# Patient Record
Sex: Female | Born: 1980 | Race: Black or African American | Hispanic: No | Marital: Married | Smoking: Former smoker
Health system: Southern US, Community
[De-identification: ages and names within clinical notes are randomized; demographics above are authoritative.]

## PROBLEM LIST (undated history)

## (undated) DIAGNOSIS — N390 Urinary tract infection, site not specified: Secondary | ICD-10-CM

## (undated) DIAGNOSIS — N186 End stage renal disease: Secondary | ICD-10-CM

## (undated) DIAGNOSIS — N2581 Secondary hyperparathyroidism of renal origin: Secondary | ICD-10-CM

## (undated) DIAGNOSIS — K219 Gastro-esophageal reflux disease without esophagitis: Secondary | ICD-10-CM

## (undated) DIAGNOSIS — D649 Anemia, unspecified: Secondary | ICD-10-CM

## (undated) DIAGNOSIS — N760 Acute vaginitis: Secondary | ICD-10-CM

## (undated) DIAGNOSIS — R630 Anorexia: Secondary | ICD-10-CM

## (undated) DIAGNOSIS — K658 Other peritonitis: Secondary | ICD-10-CM

## (undated) DIAGNOSIS — Z923 Personal history of irradiation: Secondary | ICD-10-CM

## (undated) DIAGNOSIS — B9689 Other specified bacterial agents as the cause of diseases classified elsewhere: Secondary | ICD-10-CM

## (undated) DIAGNOSIS — Z992 Dependence on renal dialysis: Secondary | ICD-10-CM

## (undated) DIAGNOSIS — I10 Essential (primary) hypertension: Secondary | ICD-10-CM

## (undated) DIAGNOSIS — R350 Frequency of micturition: Secondary | ICD-10-CM

## (undated) DIAGNOSIS — R319 Hematuria, unspecified: Secondary | ICD-10-CM

## (undated) DIAGNOSIS — N898 Other specified noninflammatory disorders of vagina: Principal | ICD-10-CM

## (undated) HISTORY — DX: Frequency of micturition: R35.0

## (undated) HISTORY — DX: Urinary tract infection, site not specified: N39.0

## (undated) HISTORY — DX: Hematuria, unspecified: R31.9

## (undated) HISTORY — DX: Other specified bacterial agents as the cause of diseases classified elsewhere: B96.89

## (undated) HISTORY — DX: Essential (primary) hypertension: I10

## (undated) HISTORY — DX: Anorexia: R63.0

## (undated) HISTORY — DX: Acute vaginitis: N76.0

## (undated) HISTORY — DX: Other specified noninflammatory disorders of vagina: N89.8

## (undated) HISTORY — PX: UMBILICAL HERNIA REPAIR: SHX196

## (undated) HISTORY — PX: WISDOM TOOTH EXTRACTION: SHX21

---

## 2002-02-06 ENCOUNTER — Emergency Department (HOSPITAL_COMMUNITY): Admission: EM | Admit: 2002-02-06 | Discharge: 2002-02-06 | Payer: Self-pay | Admitting: Emergency Medicine

## 2002-10-22 ENCOUNTER — Emergency Department (HOSPITAL_COMMUNITY): Admission: EM | Admit: 2002-10-22 | Discharge: 2002-10-22 | Payer: Self-pay | Admitting: Internal Medicine

## 2004-02-05 ENCOUNTER — Other Ambulatory Visit: Admission: RE | Admit: 2004-02-05 | Discharge: 2004-02-05 | Payer: Self-pay

## 2004-03-21 ENCOUNTER — Ambulatory Visit (HOSPITAL_COMMUNITY): Admission: RE | Admit: 2004-03-21 | Discharge: 2004-03-21 | Payer: Self-pay | Admitting: Nephrology

## 2006-10-05 DIAGNOSIS — N186 End stage renal disease: Secondary | ICD-10-CM

## 2006-10-05 DIAGNOSIS — Z992 Dependence on renal dialysis: Secondary | ICD-10-CM

## 2006-10-05 HISTORY — PX: OTHER SURGICAL HISTORY: SHX169

## 2006-10-05 HISTORY — DX: Dependence on renal dialysis: N18.6

## 2006-10-05 HISTORY — DX: End stage renal disease: Z99.2

## 2007-05-23 ENCOUNTER — Emergency Department (HOSPITAL_COMMUNITY): Admission: EM | Admit: 2007-05-23 | Discharge: 2007-05-23 | Payer: Self-pay | Admitting: Emergency Medicine

## 2008-03-21 ENCOUNTER — Ambulatory Visit (HOSPITAL_COMMUNITY): Admission: RE | Admit: 2008-03-21 | Discharge: 2008-03-21 | Payer: Self-pay | Admitting: Nephrology

## 2008-04-04 ENCOUNTER — Ambulatory Visit (HOSPITAL_COMMUNITY): Payer: Self-pay | Admitting: Internal Medicine

## 2008-04-04 ENCOUNTER — Encounter (HOSPITAL_COMMUNITY): Admission: RE | Admit: 2008-04-04 | Discharge: 2008-05-04 | Payer: Self-pay | Admitting: Oncology

## 2008-04-17 ENCOUNTER — Ambulatory Visit (HOSPITAL_COMMUNITY): Admission: RE | Admit: 2008-04-17 | Discharge: 2008-04-17 | Payer: Self-pay | Admitting: Nephrology

## 2008-05-16 ENCOUNTER — Encounter (HOSPITAL_COMMUNITY): Admission: RE | Admit: 2008-05-16 | Discharge: 2008-06-15 | Payer: Self-pay | Admitting: Internal Medicine

## 2008-05-18 ENCOUNTER — Ambulatory Visit (HOSPITAL_COMMUNITY): Admission: RE | Admit: 2008-05-18 | Discharge: 2008-05-18 | Payer: Self-pay | Admitting: Nephrology

## 2008-05-22 ENCOUNTER — Ambulatory Visit: Payer: Self-pay | Admitting: Vascular Surgery

## 2008-05-30 ENCOUNTER — Ambulatory Visit (HOSPITAL_COMMUNITY): Payer: Self-pay | Admitting: Internal Medicine

## 2008-06-01 ENCOUNTER — Ambulatory Visit (HOSPITAL_COMMUNITY): Payer: Self-pay | Admitting: Nephrology

## 2008-06-08 ENCOUNTER — Ambulatory Visit: Payer: Self-pay | Admitting: Vascular Surgery

## 2008-06-08 ENCOUNTER — Ambulatory Visit (HOSPITAL_COMMUNITY): Admission: RE | Admit: 2008-06-08 | Discharge: 2008-06-08 | Payer: Self-pay | Admitting: Vascular Surgery

## 2008-06-15 ENCOUNTER — Ambulatory Visit (HOSPITAL_COMMUNITY): Admission: RE | Admit: 2008-06-15 | Discharge: 2008-06-15 | Payer: Self-pay | Admitting: Nephrology

## 2008-07-04 ENCOUNTER — Encounter (HOSPITAL_COMMUNITY): Admission: RE | Admit: 2008-07-04 | Discharge: 2008-08-03 | Payer: Self-pay | Admitting: Nephrology

## 2008-07-23 ENCOUNTER — Encounter (HOSPITAL_COMMUNITY): Admission: RE | Admit: 2008-07-23 | Discharge: 2008-08-22 | Payer: Self-pay | Admitting: Oncology

## 2008-08-02 ENCOUNTER — Ambulatory Visit: Payer: Self-pay | Admitting: *Deleted

## 2008-08-02 ENCOUNTER — Ambulatory Visit (HOSPITAL_COMMUNITY): Admission: RE | Admit: 2008-08-02 | Discharge: 2008-08-02 | Payer: Self-pay | Admitting: Nephrology

## 2008-08-08 ENCOUNTER — Ambulatory Visit (HOSPITAL_COMMUNITY): Payer: Self-pay | Admitting: Internal Medicine

## 2008-08-10 ENCOUNTER — Ambulatory Visit: Payer: Self-pay | Admitting: Vascular Surgery

## 2008-08-10 ENCOUNTER — Ambulatory Visit (HOSPITAL_COMMUNITY): Admission: RE | Admit: 2008-08-10 | Discharge: 2008-08-10 | Payer: Self-pay | Admitting: Vascular Surgery

## 2008-08-28 ENCOUNTER — Ambulatory Visit: Payer: Self-pay | Admitting: Vascular Surgery

## 2008-09-13 ENCOUNTER — Encounter (HOSPITAL_COMMUNITY): Admission: RE | Admit: 2008-09-13 | Discharge: 2008-10-03 | Payer: Self-pay | Admitting: Internal Medicine

## 2008-10-09 ENCOUNTER — Ambulatory Visit: Payer: Self-pay | Admitting: Vascular Surgery

## 2008-10-19 ENCOUNTER — Ambulatory Visit (HOSPITAL_COMMUNITY): Admission: RE | Admit: 2008-10-19 | Discharge: 2008-10-19 | Payer: Self-pay | Admitting: Surgery

## 2008-10-19 ENCOUNTER — Ambulatory Visit: Payer: Self-pay | Admitting: Surgery

## 2008-11-07 ENCOUNTER — Ambulatory Visit (HOSPITAL_COMMUNITY): Payer: Self-pay | Admitting: Internal Medicine

## 2008-11-07 ENCOUNTER — Encounter (HOSPITAL_COMMUNITY): Admission: RE | Admit: 2008-11-07 | Discharge: 2008-12-07 | Payer: Self-pay | Admitting: Internal Medicine

## 2008-11-12 ENCOUNTER — Ambulatory Visit: Payer: Self-pay | Admitting: Surgery

## 2008-11-28 ENCOUNTER — Ambulatory Visit (HOSPITAL_COMMUNITY): Admission: RE | Admit: 2008-11-28 | Discharge: 2008-11-28 | Payer: Self-pay | Admitting: Surgery

## 2008-11-28 ENCOUNTER — Ambulatory Visit: Payer: Self-pay | Admitting: Surgery

## 2008-12-13 ENCOUNTER — Ambulatory Visit (HOSPITAL_COMMUNITY): Admission: RE | Admit: 2008-12-13 | Discharge: 2008-12-13 | Payer: Self-pay | Admitting: Critical Care Medicine

## 2008-12-25 ENCOUNTER — Ambulatory Visit (HOSPITAL_COMMUNITY): Payer: Self-pay | Admitting: Internal Medicine

## 2008-12-25 ENCOUNTER — Encounter (HOSPITAL_COMMUNITY): Admission: RE | Admit: 2008-12-25 | Discharge: 2009-01-24 | Payer: Self-pay | Admitting: Internal Medicine

## 2008-12-31 ENCOUNTER — Ambulatory Visit: Payer: Self-pay | Admitting: Surgery

## 2009-01-02 ENCOUNTER — Ambulatory Visit: Payer: Self-pay | Admitting: Oncology

## 2009-01-31 ENCOUNTER — Encounter (HOSPITAL_COMMUNITY): Admission: RE | Admit: 2009-01-31 | Discharge: 2009-03-02 | Payer: Self-pay | Admitting: Nephrology

## 2009-01-31 ENCOUNTER — Ambulatory Visit (HOSPITAL_COMMUNITY): Payer: Self-pay | Admitting: Internal Medicine

## 2009-02-01 ENCOUNTER — Ambulatory Visit (HOSPITAL_COMMUNITY): Admission: RE | Admit: 2009-02-01 | Discharge: 2009-02-01 | Payer: Self-pay | Admitting: Nephrology

## 2009-02-01 LAB — CBC WITH DIFFERENTIAL/PLATELET
BASO%: 0.5 % (ref 0.0–2.0)
LYMPH%: 38.5 % (ref 14.0–49.7)
MCHC: 32.8 g/dL (ref 31.5–36.0)
MONO#: 0.3 10*3/uL (ref 0.1–0.9)
RBC: 4.22 10*6/uL (ref 3.70–5.45)
RDW: 16.9 % — ABNORMAL HIGH (ref 11.2–14.5)
WBC: 4.5 10*3/uL (ref 3.9–10.3)
lymph#: 1.7 10*3/uL (ref 0.9–3.3)

## 2009-02-01 LAB — COMPREHENSIVE METABOLIC PANEL
ALT: 11 U/L (ref 0–35)
AST: 15 U/L (ref 0–37)
Calcium: 8.3 mg/dL — ABNORMAL LOW (ref 8.4–10.5)
Chloride: 108 mEq/L (ref 96–112)
Creatinine, Ser: 7.27 mg/dL — ABNORMAL HIGH (ref 0.40–1.20)
Potassium: 3.6 mEq/L (ref 3.5–5.3)

## 2009-02-01 LAB — CHCC SMEAR

## 2009-02-06 LAB — HYPERCOAGULABLE PANEL, COMPREHENSIVE RET.
Anticardiolipin IgA: 8 [APL'U] (ref <13)
Anticardiolipin IgM: <7 [MPL'U] (ref <10)
Beta-2 Glyco I IgG: 5 U/mL (ref <20)
Protein C Activity: 116 % (ref 75–133)
Protein C, Total: 67 % — ABNORMAL LOW (ref 70–140)

## 2009-02-06 LAB — FACTOR 8 ASSAY: Coagulation Factor VIII: 87 % (ref 73–140)

## 2009-02-14 ENCOUNTER — Encounter (HOSPITAL_COMMUNITY): Admission: RE | Admit: 2009-02-14 | Discharge: 2009-03-16 | Payer: Self-pay | Admitting: Internal Medicine

## 2009-02-20 ENCOUNTER — Ambulatory Visit: Payer: Self-pay | Admitting: Oncology

## 2009-03-11 ENCOUNTER — Ambulatory Visit: Payer: Self-pay | Admitting: Surgery

## 2009-03-14 ENCOUNTER — Ambulatory Visit (HOSPITAL_COMMUNITY): Admission: RE | Admit: 2009-03-14 | Discharge: 2009-03-14 | Payer: Self-pay | Admitting: Nephrology

## 2009-04-02 ENCOUNTER — Ambulatory Visit (HOSPITAL_COMMUNITY): Payer: Self-pay | Admitting: Internal Medicine

## 2009-04-02 ENCOUNTER — Encounter (HOSPITAL_COMMUNITY): Admission: RE | Admit: 2009-04-02 | Discharge: 2009-05-02 | Payer: Self-pay | Admitting: Internal Medicine

## 2009-05-07 ENCOUNTER — Encounter (HOSPITAL_COMMUNITY): Admission: RE | Admit: 2009-05-07 | Discharge: 2009-06-06 | Payer: Self-pay | Admitting: Internal Medicine

## 2009-05-23 ENCOUNTER — Ambulatory Visit (HOSPITAL_COMMUNITY): Payer: Self-pay | Admitting: Internal Medicine

## 2009-05-28 ENCOUNTER — Ambulatory Visit: Payer: Self-pay | Admitting: Oncology

## 2009-06-12 ENCOUNTER — Ambulatory Visit (HOSPITAL_COMMUNITY): Admission: RE | Admit: 2009-06-12 | Discharge: 2009-06-12 | Payer: Self-pay | Admitting: General Surgery

## 2009-07-16 ENCOUNTER — Emergency Department (HOSPITAL_COMMUNITY): Admission: EM | Admit: 2009-07-16 | Discharge: 2009-07-16 | Payer: Self-pay | Admitting: Emergency Medicine

## 2009-08-08 ENCOUNTER — Ambulatory Visit (HOSPITAL_COMMUNITY): Admission: RE | Admit: 2009-08-08 | Discharge: 2009-08-08 | Payer: Self-pay | Admitting: Internal Medicine

## 2009-08-10 ENCOUNTER — Ambulatory Visit (HOSPITAL_COMMUNITY): Admission: RE | Admit: 2009-08-10 | Discharge: 2009-08-10 | Payer: Self-pay | Admitting: Internal Medicine

## 2009-09-04 ENCOUNTER — Inpatient Hospital Stay (HOSPITAL_COMMUNITY): Admission: EM | Admit: 2009-09-04 | Discharge: 2009-09-08 | Payer: Self-pay | Admitting: Emergency Medicine

## 2009-09-04 ENCOUNTER — Ambulatory Visit: Payer: Self-pay | Admitting: Cardiology

## 2009-09-04 DIAGNOSIS — K219 Gastro-esophageal reflux disease without esophagitis: Secondary | ICD-10-CM

## 2009-09-04 HISTORY — DX: Gastro-esophageal reflux disease without esophagitis: K21.9

## 2009-09-06 ENCOUNTER — Encounter (INDEPENDENT_AMBULATORY_CARE_PROVIDER_SITE_OTHER): Payer: Self-pay | Admitting: Nephrology

## 2010-04-20 IMAGING — CR DG FEMUR 2V*L*
4 series · 4 of 4 positions shown · non-contrast
Comparison: None.

CLINICAL DATA: Left thigh pain.  Question bone versus muscle
abnormality?

LEFT FEMUR - 2 VIEW

[view not recorded (1 of 4)]
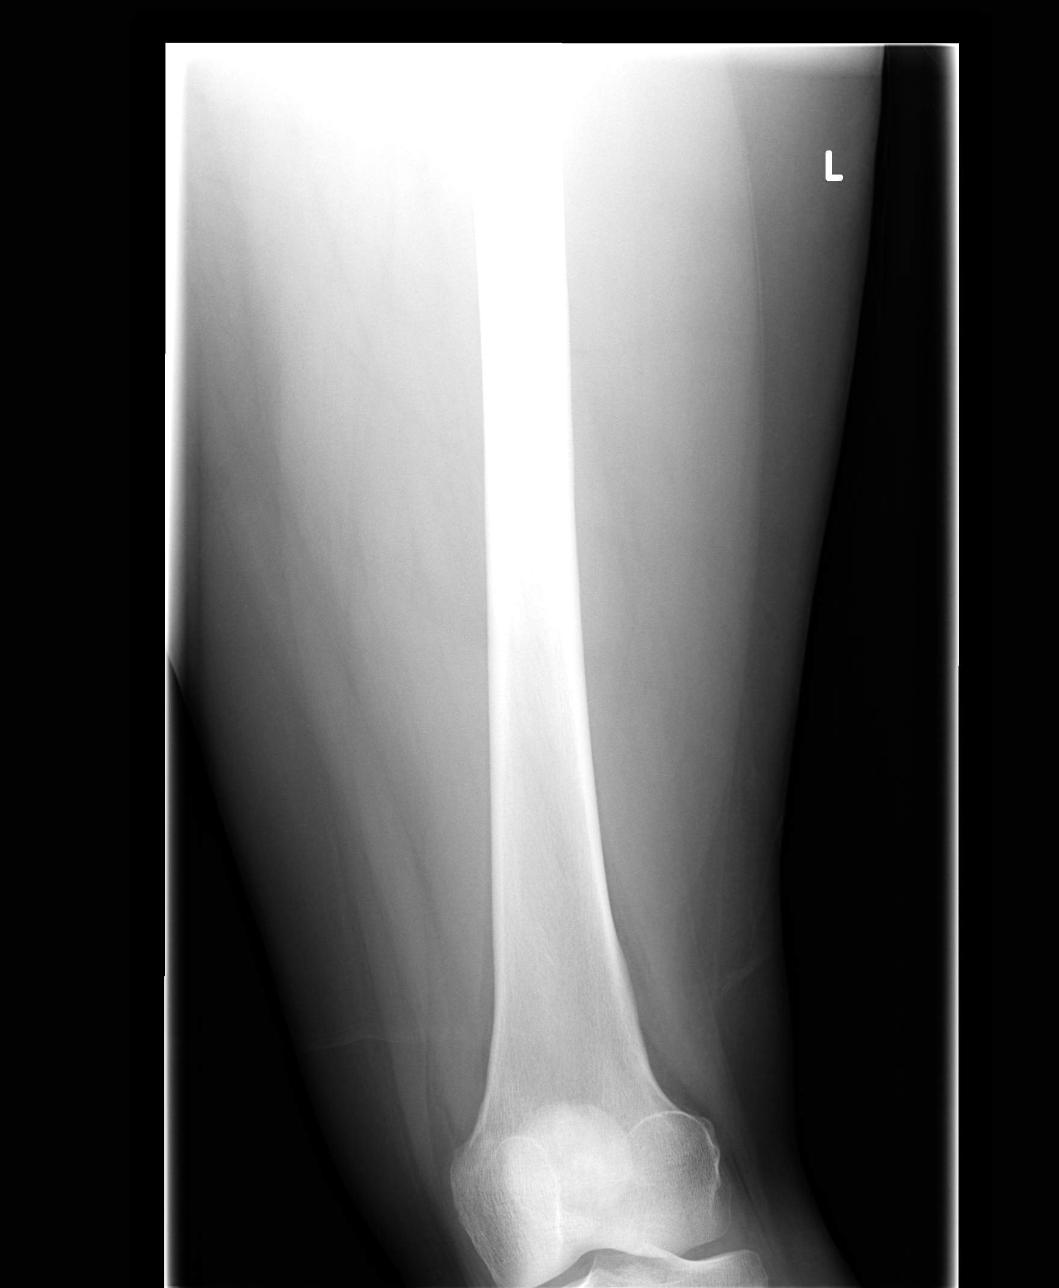

[view not recorded (2 of 4)]
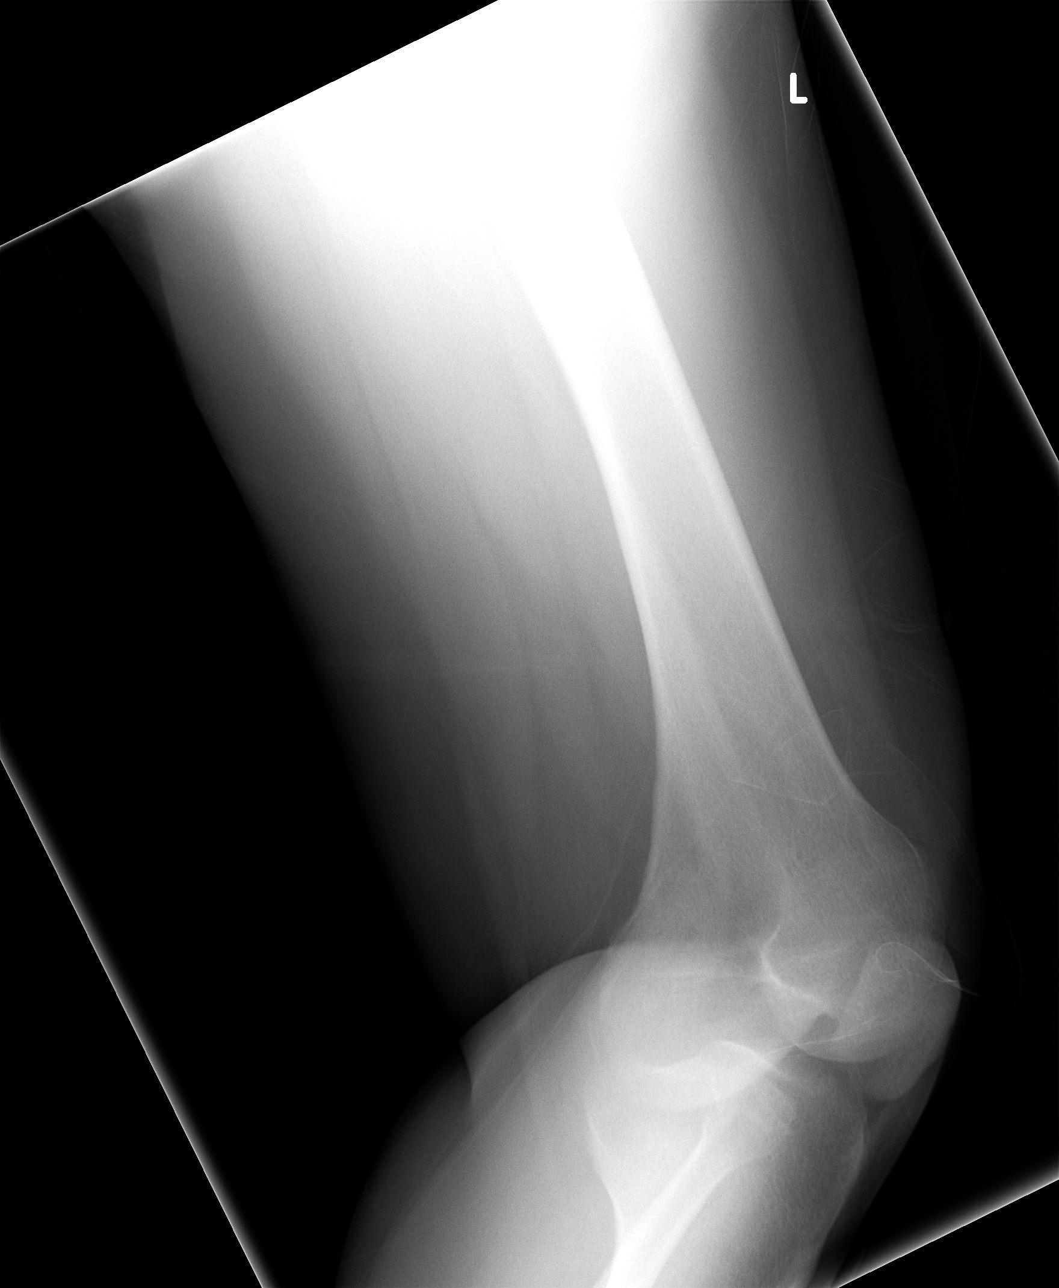

[view not recorded (3 of 4)]
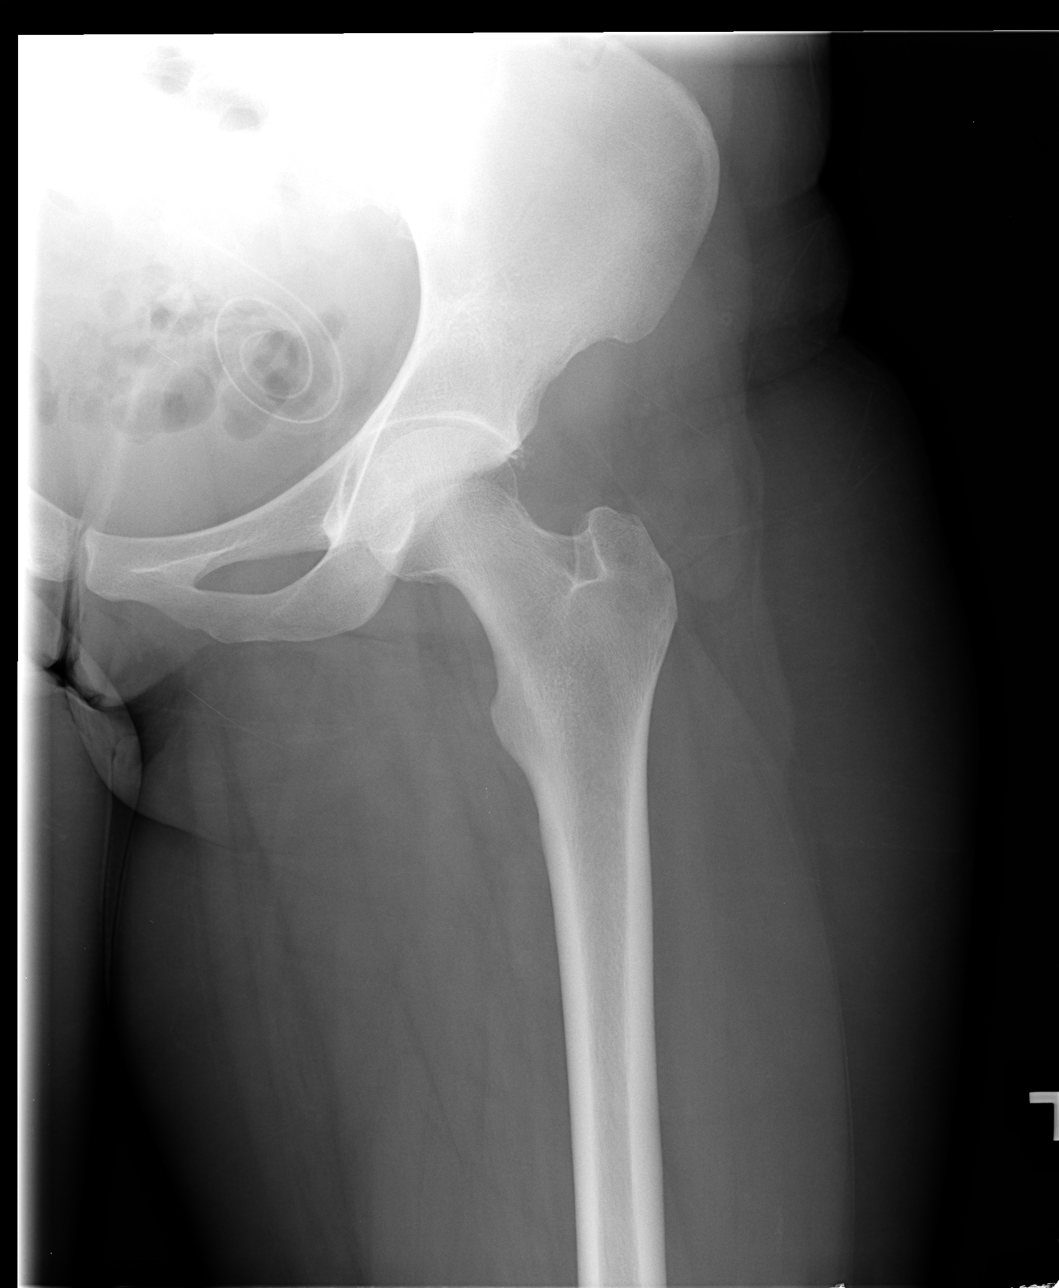

[view not recorded (4 of 4)]
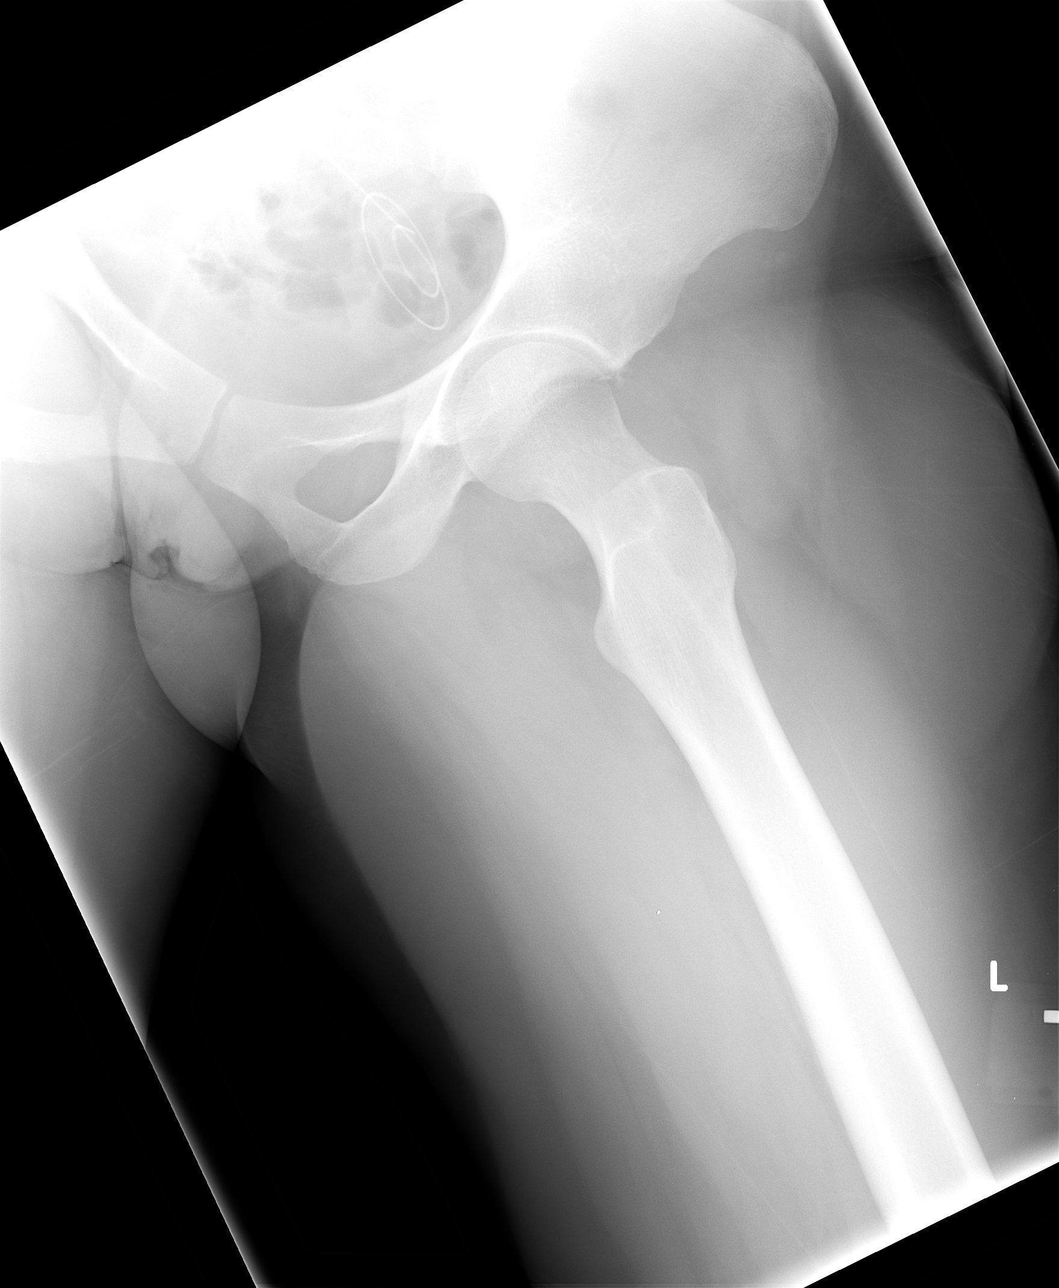

[4 of 4 positions shown; findings below may reference images not displayed]

FINDINGS: No bony or soft tissue abnormality.  MR imaging may prove
helpful further delineation if symptoms persist.
IMPRESSION: No bony or soft tissue abnormality.  MR imaging may prove helpful
further delineation if symptoms persist.

## 2010-04-20 IMAGING — CR DG HIP (WITH OR WITHOUT PELVIS) 2-3V*L*
3 series · 3 of 3 positions shown · non-contrast
Comparison: None.

CLINICAL DATA: Left thigh pain for past week.  Peritoneal dialysis.
No history of trauma provided. Question bone abnormality versus
myositis or muscle tear.

LEFT HIP - COMPLETE 2+ VIEW

[view not recorded (1 of 3)]
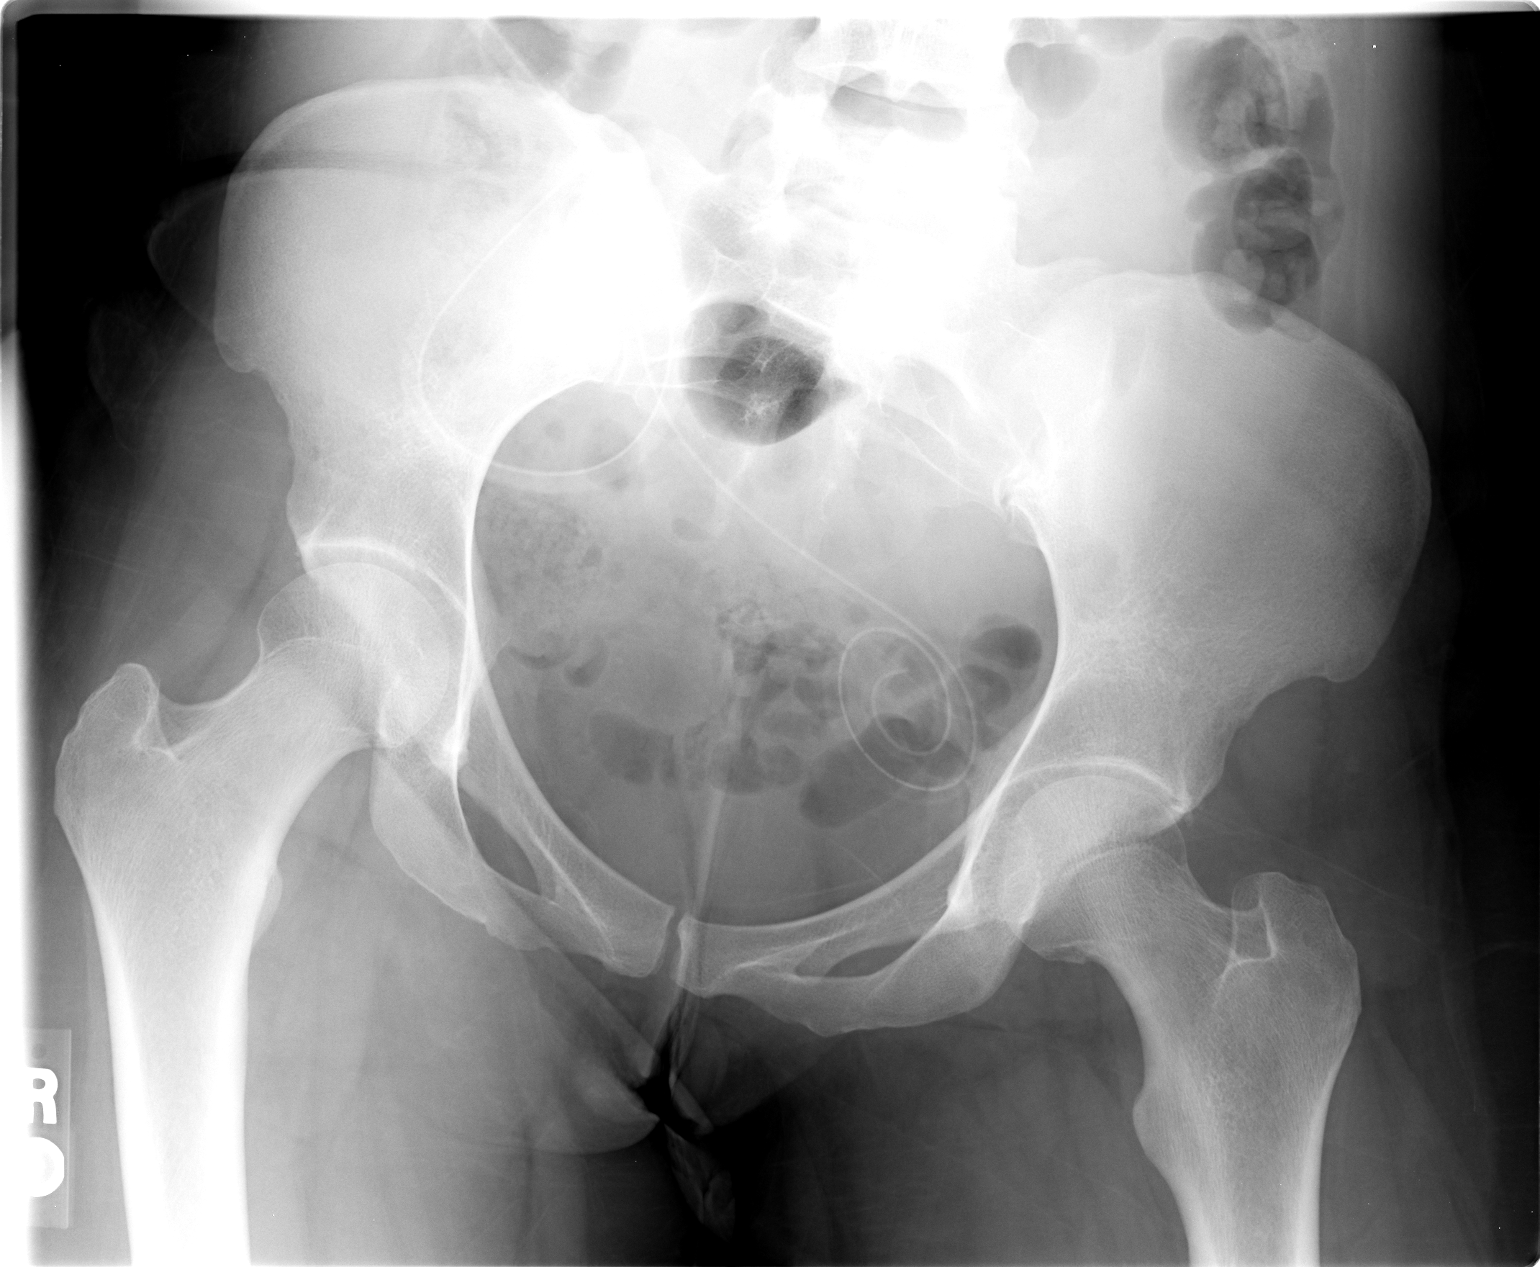

[view not recorded (2 of 3)]
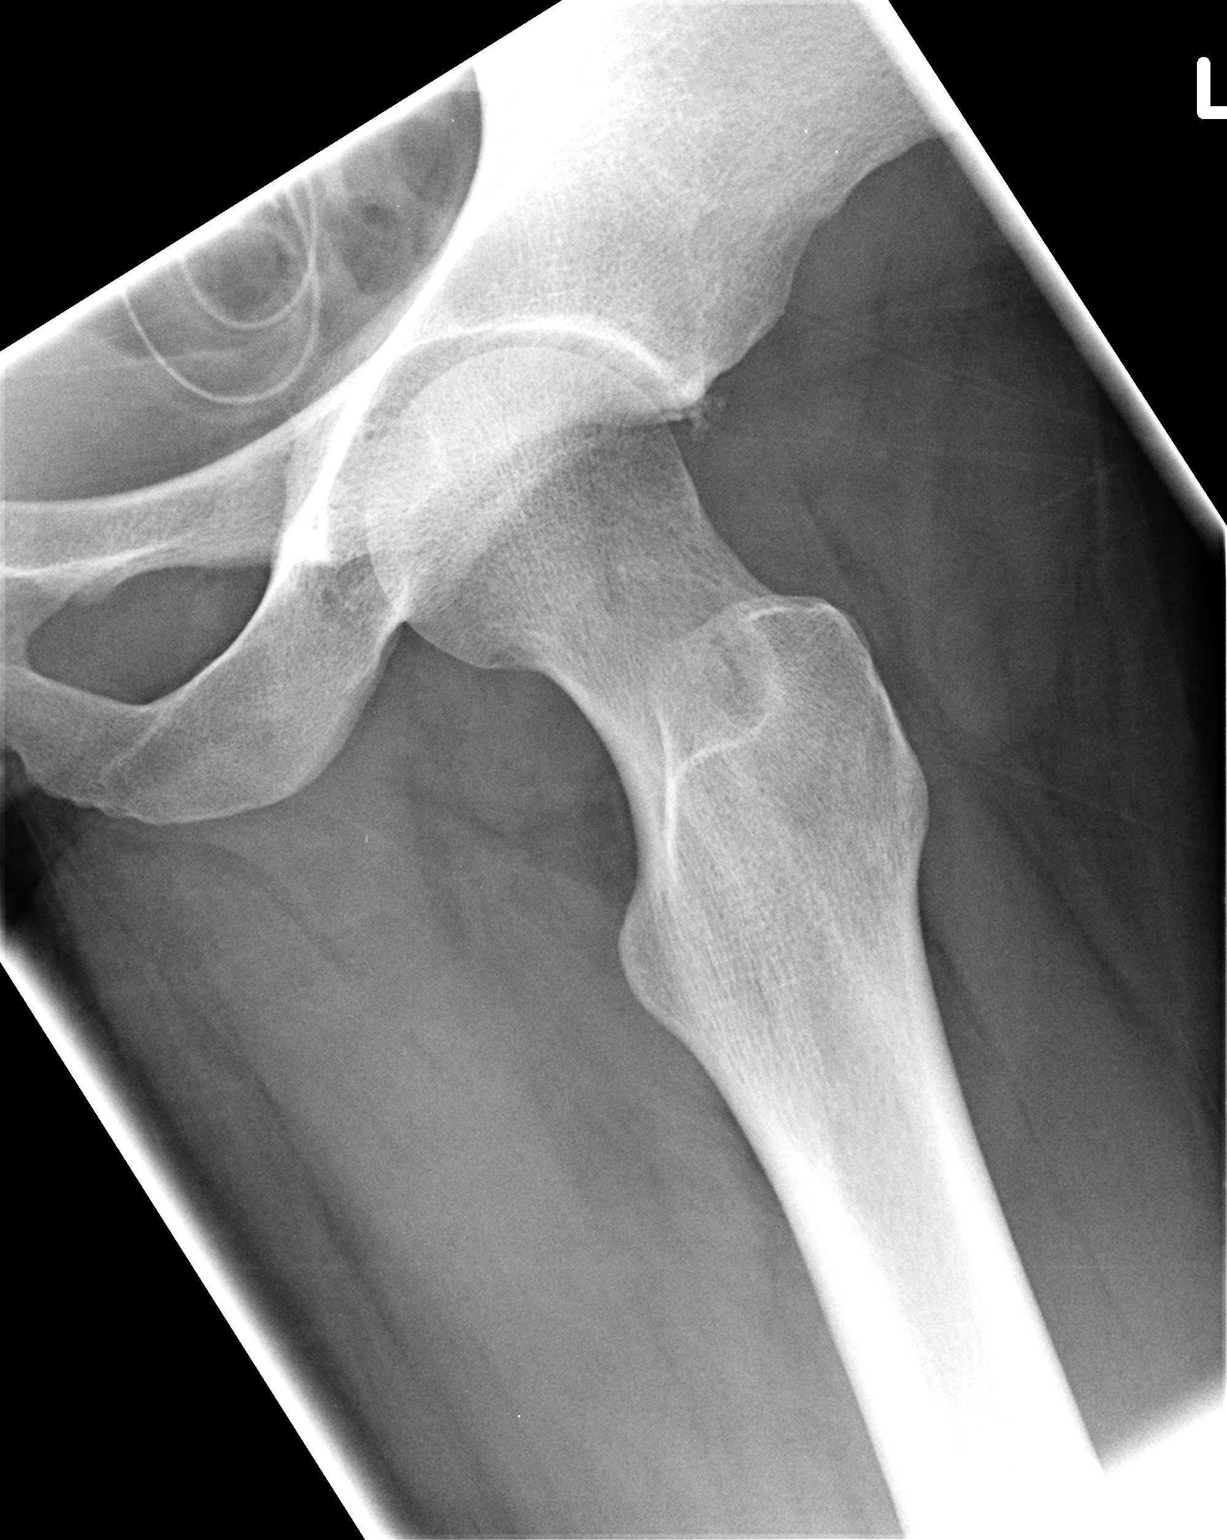

[view not recorded (3 of 3)]
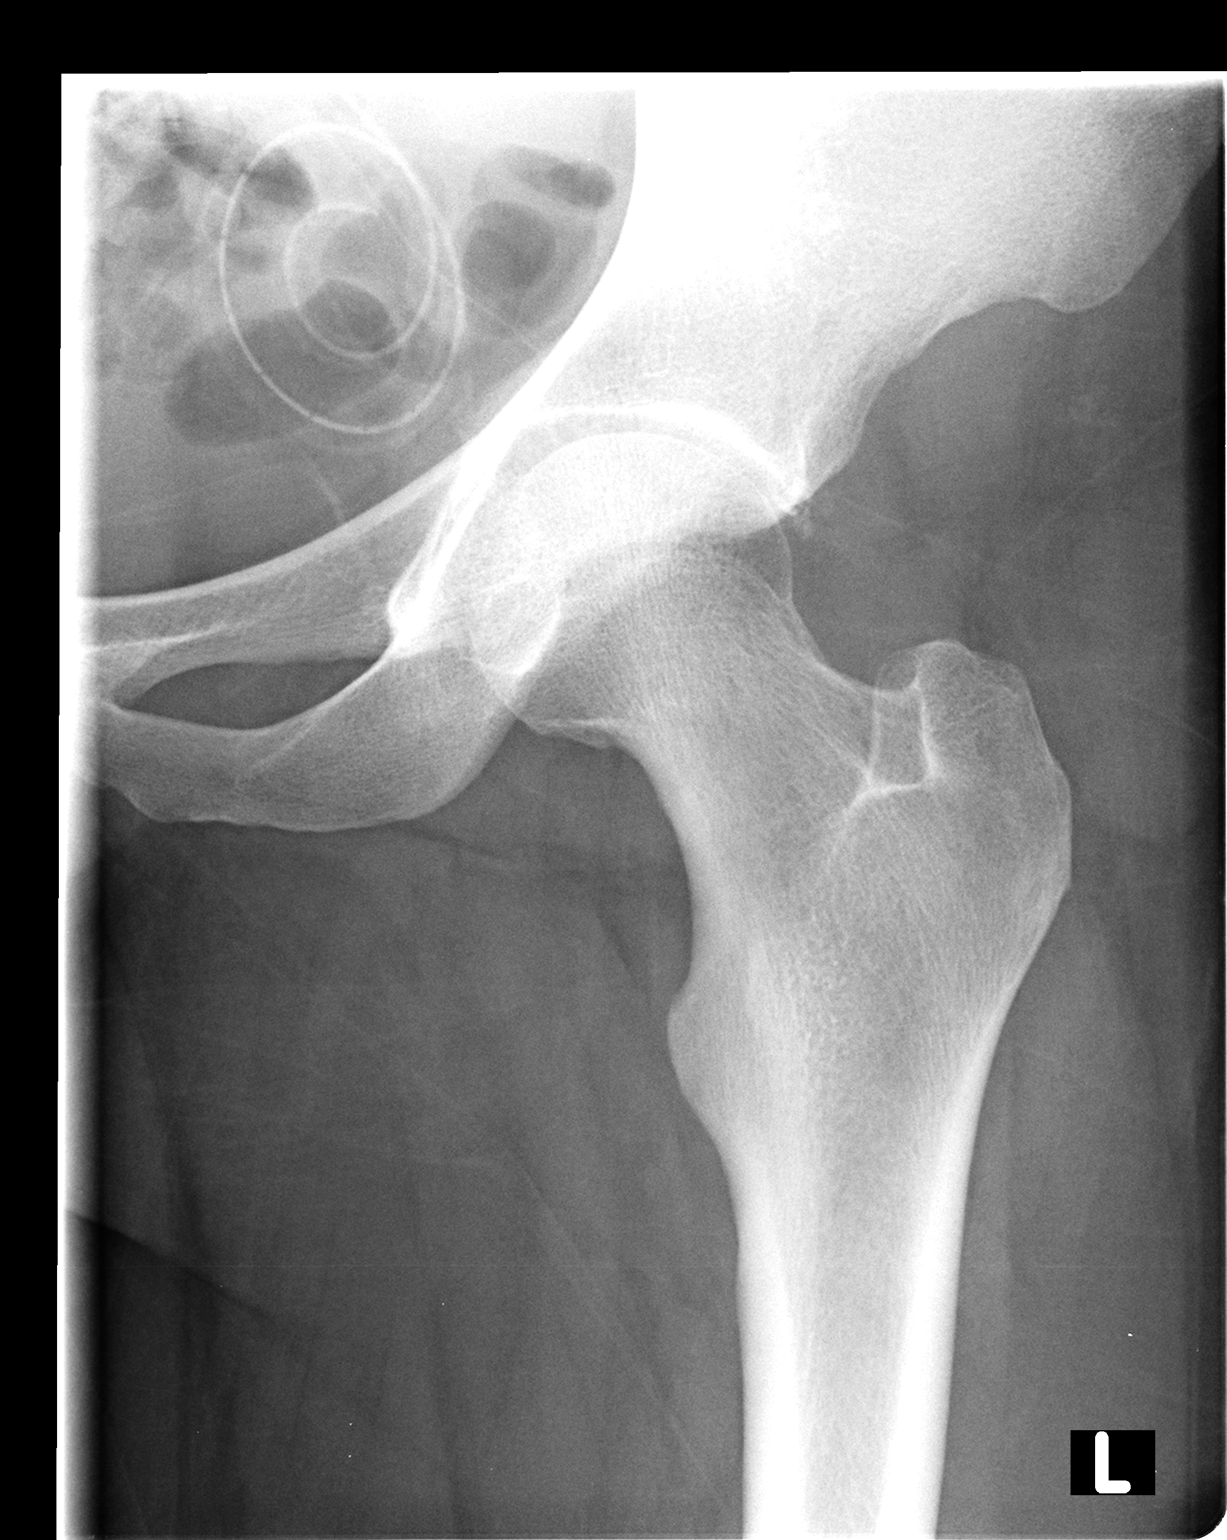

[3 of 3 positions shown; findings below may reference images not displayed]

FINDINGS: No  fracture.  No bony destructive lesion.  No plain film
evidence of avascular necrosis.  Peritoneal dialysis catheter is in
place.  If the patient has persistent discomfort, MR imaging may
prove more sensitive for detection of bony/soft tissue abnormality.
IMPRESSION: No plain film evidence of bony abnormality.  Please see above.

## 2011-01-06 LAB — COMPREHENSIVE METABOLIC PANEL
ALT: 15 U/L (ref 0–35)
ALT: 20 U/L (ref 0–35)
AST: 12 U/L (ref 0–37)
AST: 25 U/L (ref 0–37)
Albumin: 3.7 g/dL (ref 3.5–5.2)
Albumin: 4.4 g/dL (ref 3.5–5.2)
Alkaline Phosphatase: 54 U/L (ref 39–117)
BUN: 30 mg/dL — ABNORMAL HIGH (ref 6–23)
BUN: 36 mg/dL — ABNORMAL HIGH (ref 6–23)
CO2: 25 mEq/L (ref 19–32)
Calcium: 9.4 mg/dL (ref 8.4–10.5)
Chloride: 107 mEq/L (ref 96–112)
Chloride: 94 mEq/L — ABNORMAL LOW (ref 96–112)
Creatinine, Ser: 18.75 mg/dL — ABNORMAL HIGH (ref 0.4–1.2)
Creatinine, Ser: 20.78 mg/dL — ABNORMAL HIGH (ref 0.4–1.2)
Creatinine, Ser: 22.3 mg/dL — ABNORMAL HIGH (ref 0.4–1.2)
GFR calc Af Amer: 3 mL/min — ABNORMAL LOW (ref >60)
GFR calc non Af Amer: 2 mL/min — ABNORMAL LOW (ref >60)
GFR calc non Af Amer: 2 mL/min — ABNORMAL LOW (ref >60)
Glucose, Bld: 99 mg/dL (ref 70–99)
Potassium: 2.8 mEq/L — ABNORMAL LOW (ref 3.5–5.1)
Potassium: 3.6 mEq/L (ref 3.5–5.1)
Sodium: 139 mEq/L (ref 135–145)
Sodium: 141 mEq/L (ref 135–145)
Total Bilirubin: 0.9 mg/dL (ref 0.3–1.2)
Total Protein: 5.3 g/dL — ABNORMAL LOW (ref 6.0–8.3)
Total Protein: 7.1 g/dL (ref 6.0–8.3)
Total Protein: 8.6 g/dL — ABNORMAL HIGH (ref 6.0–8.3)

## 2011-01-06 LAB — RENAL FUNCTION PANEL
BUN: 26 mg/dL — ABNORMAL HIGH (ref 6–23)
CO2: 23 mEq/L (ref 19–32)
CO2: 25 mEq/L (ref 19–32)
Calcium: 7.9 mg/dL — ABNORMAL LOW (ref 8.4–10.5)
Calcium: 8.1 mg/dL — ABNORMAL LOW (ref 8.4–10.5)
Chloride: 108 mEq/L (ref 96–112)
Creatinine, Ser: 15.3 mg/dL — ABNORMAL HIGH (ref 0.4–1.2)
GFR calc Af Amer: 3 mL/min — ABNORMAL LOW (ref >60)
GFR calc non Af Amer: 3 mL/min — ABNORMAL LOW (ref >60)
Glucose, Bld: 100 mg/dL — ABNORMAL HIGH (ref 70–99)
Glucose, Bld: 127 mg/dL — ABNORMAL HIGH (ref 70–99)
Phosphorus: 2.9 mg/dL (ref 2.3–4.6)
Sodium: 140 mEq/L (ref 135–145)

## 2011-01-06 LAB — DIFFERENTIAL
Basophils Absolute: 0 10*3/uL (ref 0.0–0.1)
Basophils Relative: 0 % (ref 0–1)
Basophils Relative: 1 % (ref 0–1)
Eosinophils Absolute: 0.2 10*3/uL (ref 0.0–0.7)
Eosinophils Relative: 5 % (ref 0–5)
Lymphocytes Relative: 25 % (ref 12–46)
Lymphs Abs: 2.1 10*3/uL (ref 0.7–4.0)
Monocytes Absolute: 0.3 10*3/uL (ref 0.1–1.0)
Monocytes Absolute: 0.4 10*3/uL (ref 0.1–1.0)
Monocytes Relative: 6 % (ref 3–12)
Neutro Abs: 4.9 10*3/uL (ref 1.7–7.7)
Neutrophils Relative %: 50 % (ref 43–77)

## 2011-01-06 LAB — BODY FLUID CULTURE

## 2011-01-06 LAB — CBC
HCT: 33.5 % — ABNORMAL LOW (ref 36.0–46.0)
HCT: 34.6 % — ABNORMAL LOW (ref 36.0–46.0)
Hemoglobin: 10.9 g/dL — ABNORMAL LOW (ref 12.0–15.0)
Hemoglobin: 11.5 g/dL — ABNORMAL LOW (ref 12.0–15.0)
MCHC: 32.5 g/dL (ref 30.0–36.0)
MCHC: 33 g/dL (ref 30.0–36.0)
MCHC: 33.2 g/dL (ref 30.0–36.0)
MCV: 86.1 fL (ref 78.0–100.0)
MCV: 86.4 fL (ref 78.0–100.0)
MCV: 86.5 fL (ref 78.0–100.0)
MCV: 86.9 fL (ref 78.0–100.0)
MCV: 86.9 fL (ref 78.0–100.0)
Platelets: 123 10*3/uL — ABNORMAL LOW (ref 150–400)
Platelets: 165 10*3/uL (ref 150–400)
RBC: 3.98 MIL/uL (ref 3.87–5.11)
RBC: 5.95 MIL/uL — ABNORMAL HIGH (ref 3.87–5.11)
RDW: 15.2 % (ref 11.5–15.5)
RDW: 15.3 % (ref 11.5–15.5)
RDW: 15.4 % (ref 11.5–15.5)
WBC: 4.6 10*3/uL (ref 4.0–10.5)
WBC: 5.3 10*3/uL (ref 4.0–10.5)
WBC: 7.2 10*3/uL (ref 4.0–10.5)
WBC: 7.8 10*3/uL (ref 4.0–10.5)

## 2011-01-06 LAB — BODY FLUID CELL COUNT WITH DIFFERENTIAL: Total Nucleated Cell Count, Fluid: 1 cu mm (ref 0–1000)

## 2011-01-06 LAB — BASIC METABOLIC PANEL
BUN: 36 mg/dL — ABNORMAL HIGH (ref 6–23)
Chloride: 103 mEq/L (ref 96–112)
Creatinine, Ser: 19.81 mg/dL — ABNORMAL HIGH (ref 0.4–1.2)
GFR calc Af Amer: 3 mL/min — ABNORMAL LOW (ref >60)
Glucose, Bld: 129 mg/dL — ABNORMAL HIGH (ref 70–99)

## 2011-01-06 LAB — AMYLASE: Amylase: 206 U/L — ABNORMAL HIGH (ref 0–105)

## 2011-01-06 LAB — GRAM STAIN

## 2011-01-06 LAB — CORTISOL: Cortisol, Plasma: 13 ug/dL

## 2011-01-08 LAB — COMPREHENSIVE METABOLIC PANEL
ALT: 13 U/L (ref 0–35)
AST: 15 U/L (ref 0–37)
Albumin: 5.2 g/dL (ref 3.5–5.2)
Alkaline Phosphatase: 65 U/L (ref 39–117)
CO2: 23 mEq/L (ref 19–32)
Chloride: 91 mEq/L — ABNORMAL LOW (ref 96–112)
Creatinine, Ser: 19.06 mg/dL — ABNORMAL HIGH (ref 0.4–1.2)
GFR calc Af Amer: 3 mL/min — ABNORMAL LOW (ref >60)
Potassium: 3.4 mEq/L — ABNORMAL LOW (ref 3.5–5.1)
Sodium: 137 mEq/L (ref 135–145)
Total Bilirubin: 0.9 mg/dL (ref 0.3–1.2)

## 2011-01-08 LAB — URINE MICROSCOPIC-ADD ON

## 2011-01-08 LAB — URINALYSIS, ROUTINE W REFLEX MICROSCOPIC
Ketones, ur: 15 mg/dL — AB
Specific Gravity, Urine: >1.03 — ABNORMAL HIGH (ref 1.005–1.030)
pH: 5 (ref 5.0–8.0)

## 2011-01-08 LAB — DIFFERENTIAL
Basophils Absolute: 0 10*3/uL (ref 0.0–0.1)
Eosinophils Absolute: 0.1 10*3/uL (ref 0.0–0.7)
Eosinophils Relative: 2 % (ref 0–5)
Lymphocytes Relative: 26 % (ref 12–46)
Monocytes Absolute: 0.6 10*3/uL (ref 0.1–1.0)

## 2011-01-08 LAB — CBC
Platelets: 262 10*3/uL (ref 150–400)
RBC: 5.75 MIL/uL — ABNORMAL HIGH (ref 3.87–5.11)
WBC: 8.4 10*3/uL (ref 4.0–10.5)

## 2011-01-08 LAB — URINE CULTURE: Culture: NO GROWTH

## 2011-01-09 LAB — CBC
MCHC: 33.8 g/dL (ref 30.0–36.0)
MCV: 87.1 fL (ref 78.0–100.0)
Platelets: 178 10*3/uL (ref 150–400)
WBC: 4.6 10*3/uL (ref 4.0–10.5)

## 2011-01-09 LAB — IRON AND TIBC
Saturation Ratios: 24 % (ref 20–55)
TIBC: 230 ug/dL — ABNORMAL LOW (ref 250–470)
UIBC: 174 ug/dL

## 2011-01-09 LAB — BASIC METABOLIC PANEL
BUN: 39 mg/dL — ABNORMAL HIGH (ref 6–23)
CO2: 24 mEq/L (ref 19–32)
Chloride: 108 mEq/L (ref 96–112)
Creatinine, Ser: 7.84 mg/dL — ABNORMAL HIGH (ref 0.4–1.2)

## 2011-01-09 LAB — HEMOGLOBIN AND HEMATOCRIT, BLOOD: HCT: 32.6 % — ABNORMAL LOW (ref 36.0–46.0)

## 2011-01-10 LAB — HEMOGLOBIN AND HEMATOCRIT, BLOOD
HCT: 36.3 % (ref 36.0–46.0)
Hemoglobin: 10.4 g/dL — ABNORMAL LOW (ref 12.0–15.0)

## 2011-01-11 LAB — RENAL FUNCTION PANEL
Calcium: 8.9 mg/dL (ref 8.4–10.5)
GFR calc Af Amer: 8 mL/min — ABNORMAL LOW (ref >60)
GFR calc non Af Amer: 7 mL/min — ABNORMAL LOW (ref >60)
Phosphorus: 5.1 mg/dL — ABNORMAL HIGH (ref 2.3–4.6)
Sodium: 137 mEq/L (ref 135–145)

## 2011-01-11 LAB — HEMOGLOBIN AND HEMATOCRIT, BLOOD
HCT: 33.7 % — ABNORMAL LOW (ref 36.0–46.0)
Hemoglobin: 11.3 g/dL — ABNORMAL LOW (ref 12.0–15.0)

## 2011-01-12 LAB — IRON AND TIBC: Iron: 80 ug/dL (ref 42–135)

## 2011-01-12 LAB — HEMOGLOBIN AND HEMATOCRIT, BLOOD
HCT: 35.1 % — ABNORMAL LOW (ref 36.0–46.0)
Hemoglobin: 12.1 g/dL (ref 12.0–15.0)

## 2011-01-13 LAB — IRON AND TIBC
Iron: 46 ug/dL (ref 42–135)
Saturation Ratios: 18 % — ABNORMAL LOW (ref 20–55)
TIBC: 250 ug/dL (ref 250–470)

## 2011-01-13 LAB — HEMOGLOBIN AND HEMATOCRIT, BLOOD: Hemoglobin: 11.4 g/dL — ABNORMAL LOW (ref 12.0–15.0)

## 2011-01-14 LAB — IRON AND TIBC
Iron: 32 ug/dL — ABNORMAL LOW (ref 42–135)
Saturation Ratios: 15 % — ABNORMAL LOW (ref 20–55)
TIBC: 264 ug/dL (ref 250–470)
UIBC: 224 ug/dL
UIBC: 232 ug/dL

## 2011-01-14 LAB — HEMOGLOBIN AND HEMATOCRIT, BLOOD: HCT: 33.1 % — ABNORMAL LOW (ref 36.0–46.0)

## 2011-01-15 LAB — HEMOGLOBIN AND HEMATOCRIT, BLOOD
HCT: 35.3 % — ABNORMAL LOW (ref 36.0–46.0)
Hemoglobin: 11.2 g/dL — ABNORMAL LOW (ref 12.0–15.0)

## 2011-01-15 LAB — IRON AND TIBC
Iron: 32 ug/dL — ABNORMAL LOW (ref 42–135)
Saturation Ratios: 11 % — ABNORMAL LOW (ref 20–55)
UIBC: 263 ug/dL

## 2011-01-19 LAB — POCT I-STAT 4, (NA,K, GLUC, HGB,HCT): Hemoglobin: 11.6 g/dL — ABNORMAL LOW (ref 12.0–15.0)

## 2011-01-20 LAB — POCT I-STAT 4, (NA,K, GLUC, HGB,HCT)
Glucose, Bld: 90 mg/dL (ref 70–99)
HCT: 36 % (ref 36.0–46.0)
Hemoglobin: 12.2 g/dL (ref 12.0–15.0)
Potassium: 4.1 mEq/L (ref 3.5–5.1)

## 2011-01-20 LAB — IRON AND TIBC
Iron: 68 ug/dL (ref 42–135)
Saturation Ratios: 26 % (ref 20–55)
UIBC: 190 ug/dL

## 2011-01-22 ENCOUNTER — Emergency Department (HOSPITAL_COMMUNITY)
Admission: EM | Admit: 2011-01-22 | Discharge: 2011-01-22 | Disposition: A | Discharge status: Home / Self Care | Payer: Medicare Other | Attending: Emergency Medicine | Admitting: Emergency Medicine

## 2011-01-22 DIAGNOSIS — N186 End stage renal disease: Secondary | ICD-10-CM | POA: Insufficient documentation

## 2011-01-22 DIAGNOSIS — R110 Nausea: Secondary | ICD-10-CM | POA: Insufficient documentation

## 2011-01-22 DIAGNOSIS — I120 Hypertensive chronic kidney disease with stage 5 chronic kidney disease or end stage renal disease: Secondary | ICD-10-CM | POA: Insufficient documentation

## 2011-01-22 DIAGNOSIS — Z79899 Other long term (current) drug therapy: Secondary | ICD-10-CM | POA: Insufficient documentation

## 2011-01-22 DIAGNOSIS — F411 Generalized anxiety disorder: Secondary | ICD-10-CM | POA: Insufficient documentation

## 2011-01-22 DIAGNOSIS — R000 Tachycardia, unspecified: Secondary | ICD-10-CM | POA: Insufficient documentation

## 2011-01-22 DIAGNOSIS — Z992 Dependence on renal dialysis: Secondary | ICD-10-CM | POA: Insufficient documentation

## 2011-02-17 NOTE — Op Note (Signed)
NAME:  Raven Walker, Raven Walker NO.:  [id number]   MEDICAL RECORD NO.:  [id number]          PATIENT TYPE:  AMB   LOCATION:  SDS                          FACILITY:  [facility]   PHYSICIAN:  Nelda Severe. Kellie Simmering, M.D.  DATE OF BIRTH:  05-15-1981   DATE OF PROCEDURE:  06/08/2008  DATE OF DISCHARGE:  06/08/2008                               OPERATIVE REPORT   PREOPERATIVE DIAGNOSIS:  End-stage renal disease.   POSTOPERATIVE DIAGNOSIS:  End-stage renal disease.   OPERATION:  Insertion of left forearm arteriovenous Gore-Tex graft,  brachial artery to basilic vein (4 mm - 7 mm stretch).   SURGEON:  Nelda Severe. Kellie Simmering, MD   FIRST ASSISTANT:  Nurse.   ANESTHESIA:  Local.   PROCEDURE:  The patient was taken to the operating room and placed in  supine position at which time the left upper extremity was prepped with  Betadine scrub solution and draped in routine sterile manner.  After  infiltration with 1% Xylocaine, a transverse incision was made in the  antecubital area and antecubital vein was dissected free.  The cephalic  branch was quite small, about 2-3 mm and the basilic branch was 4.5 mm.  Brachial vein adjacent to the brachial artery was about the same size.  So, it was decided to use the basilic vein.  Brachial artery was a small  vessel, but had a good pulse and was encircled with vessel loops.  Loop  shaped tunnel was created in the forearm.  After infiltration with 0.5%  Xylocaine, a 4 x 7 mm stretch Gore-Tex graft delivered through the  tunnel.  No heparin was given.  Artery was occluded proximally and  distally, opened with 15 blade, extended with Potts scissors, 4 mm end  of the graft was spatulated, and anastomosed end-to-side with 6-0  Prolene.  Following this, the basilic vein was opened and would accept a  4.5 dilator and 7 mm end of the graft was spatulated and anastomosed end-  to-side with 6-0 Prolene.  Clamps were then released.  There was a weak  pulse and thrill  in the graft with adequate Doppler flow.  The blood  pressure was only about 90 systolic.  Small opening was made in the apex  of the graft and 3 Fogarty catheter was passed in both directions and  was widely patent.  The graft reclosed with two 6-0 Prolene sutures.  This seemed to improve the pulse and flow.  Wounds were closed in layers  with Vicryl in subcuticular fashion.  Sterile dressing was applied.  The  patient was taken to the recovery room in satisfactory condition.      Nelda Severe Kellie Simmering, M.D.  Electronically Signed     JDL/MEDQ  D:  06/08/2008  T:  06/09/2008  Job:  [id number]

## 2011-02-17 NOTE — Op Note (Signed)
NAME:  Raven Walker, Raven Walker NO.:  [id number]   MEDICAL RECORD NO.:  [id number]          PATIENT TYPE:  AMB   LOCATION:  SDS                          FACILITY:  [facility]   PHYSICIAN:  Theotis Burrow IV, MDDATE OF BIRTH:  06-28-1981   DATE OF PROCEDURE:  10/19/2008  DATE OF DISCHARGE:  10/19/2008                               OPERATIVE REPORT   PREOPERATIVE DIAGNOSIS:  Chronic kidney disease.   POSTOPERATIVE DIAGNOSIS:  Chronic kidney disease.   PROCEDURE PERFORMED:  Right forearm arteriovenous Gore-Tex graft with 6-  mm Gore-Tex.   ANESTHESIA:  MAC.   SURGEON:  1. Annamarie Major IV, MD   COMPLICATIONS:  None.   FINDINGS:  1. A 3- to 4-mm brachial artery, similar sized vein.  2. Vein is medial.   PROCEDURE:  The patient was identified in the holding area, taken to  room A, she was placed supine on the table.  The right arm was prepped  and draped in a standard sterile fashion.  A time-out was called,  antibiotics were given.  Incision was made over the palpable pulse of  the brachial artery in antecubital crease.  This was done after a local  anesthesia was placed.  Cautery was used to dissect the subcutaneous  tissue.  Fascia was divided sharply.  Brachial veins and brachial artery  were identified and each mobilized.  The posterior vein appeared to be  about 4 mm and large caliber.  It was elected to mobilize this vein for  the venous anastomosis.  The brachial artery was about 3.5 mm.  It was  mobilized proximally and distally as well.  Next, a counter incision was  made at the forearm.  Subcutaneous tunnel was created with a straight  tunneler.  A 6 mm x 50 cm stretch Gore-Tex graft was brought through the  tunnel.  The patient was then given systemic heparinization.  After the  heparin is circulated, the brachial artery was occluded with vascular  clamps.  A 11 blade was used to make an arteriotomy, which was extended  with Potts scissors.  The graft was  then beveled and an end-to-side  anastomosis was created with a running 6-0 Prolene.  Prior to  completion, the artery was flushed appropriately.  The anastomosis was  then secured.  There was significant needle-hole bleeding and therefore  I elected to give the patient 25 mg of protamine to help with the  hemostasis.  Surgicel was wrapped around the anastomosis.  At this  point, attention was turned towards the vein.  The vein was occluded  with a #11 blade, which was extended with Potts scissors.  The graft  which had excellent pulsatile flow was flushed with heparinized saline  and reoccluded.  The graft was then beveled to fit the size of the  venotomy and end-to-side anastomosis was created with a 6-0 Prolene.  Prior to completion, vein was flushed in antegrade and retrograde  fashion.  The graft was flushed.  Anastomosis was then secured.  At this  point, the patient had a palpable  thrill within her graft.  She had palpable radial artery.  Hemostasis  was then achieved.  The deep tissue was closed with 3-0 Vicryl, the skin  was closed with 4-0 Vicryl, and Dermabond was placed.  There were no  complications.  The patient tolerated the procedure well, was taken to  the recovery room in stable condition.           ______________________________  V. Leia Alf, MD  Electronically Signed     VWB/MEDQ  D:  10/19/2008  T:  10/20/2008  Job:  [id number]

## 2011-02-17 NOTE — Assessment & Plan Note (Signed)
OFFICE VISIT   Raven Walker, Raven Walker  DOB:  12-12-1980                                       08/28/2008  POIPP#:[id number]   The patient has had two failed grafts in the left arm and is not yet on  dialysis.  She initially had a forearm graft which failed because of  small veins.  I attempted to insert a left upper arm graft on November  6.  Her brachial artery was totally occluded in the distal upper arm  when I explored her.  I was able to thrombectomize it and  endarterectomize it and insert a graft into the axillary vein but that  failed relatively early.  Today the graft is occluded.  She has no  palpable radial pulse and has some mild tingling in the hand but no true  steal syndrome.  I think the next step on her would be insertion of a  graft in the right forearm which may or may not be successful.  Her  veins are too small for fistula in the right arm.  Rather than insert a  graft now we will wait until she sees Dr. Jimmy Footman to see if he would  like for [place] to hold off until she is closer to being on dialysis since  she has had two failed grafts in the left arm.   Raven Walker, M.D.  Electronically Signed   JDL/MEDQ  D:  08/28/2008  T:  08/29/2008  Job:  [id number]   cc:   Jeneen Rinks L. Deterding, M.D.

## 2011-02-17 NOTE — Procedures (Signed)
CEPHALIC VEIN MAPPING   INDICATION:  Hypertension.  Patient only has one kidney.   HISTORY:  Patient not currently on dialysis but will need to go on dialysis in the  future.   EXAM:   The right cephalic vein is compressible.   Diameter measurements range from 0.09 cm to 0.33 cm.   The left cephalic vein is compressible.   Diameter measurements range from 0.15 to 0.23.   See attached worksheet for all measurements.   IMPRESSION:  Patent bilateral cephalic veins which are not of acceptable  diameter for use as a dialysis access site.   ___________________________________________  Nelda Severe. Kellie Simmering, M.D.   MC/MEDQ  D:  05/22/2008  T:  05/22/2008  Job:  [id number]

## 2011-02-17 NOTE — Assessment & Plan Note (Signed)
OFFICE VISIT   Raven Walker, Raven Walker  DOB:  02-20-1981                                       11/12/2008  EQAST#:[id number]   REASON FOR VISIT:  Follow-up graft.   HISTORY:  This is a 30 year old female with chronic kidney disease not  yet requiring dialysis.  She has previously undergone graft placement in  her left forearm and left upper arm by Dr. Kellie Simmering, both of which were  occluded at her postoperative visit.  I was scheduled to place a right  forearm graft; this was performed on January 15.  She comes back in  today for follow-up.  Unfortunately, this graft is occluded.  I am  concerned that the patient may have some underlying coagulopathy which  is causing her to have persistent thrombosis of her grafts.  Since she  has used up several access sites and this is an acute thrombosis, I  would like to take her back to the operating room see if I could get  this and to see if I could salvage this graft.  If I am unable to get  the graft open, I would place her on Coumadin postoperatively.  If I am  unable to this graft open I will not proceed with an upper arm graft at  this time as I think she will need a full hypercoagulable workup.  The  patient is going to call and schedule when to get this done within the  next week.   Eldridge Abrahams, MD  Electronically Signed   VWB/MEDQ  D:  11/12/2008  Walker:  11/14/2008  Job:  [id number]   cc:   Dr. Marland Kitchen Deterding

## 2011-02-17 NOTE — Assessment & Plan Note (Signed)
OFFICE VISIT   Raven Walker, Raven Walker  DOB:  1981/01/23                                       08/02/2008  ELFYB#:[id number]   The patient underwent placement of a left forearm AV graft by Dr. Kellie Simmering  on 06/08/2008 at [facility].  This graft is now occluded.  She  is noted to have very small veins by duplex evaluation vein mapping.   The left forearm graft is occluded.  1+ left radial pulse present.  BP  is 159/123, pulse 76 per minute.   I have scheduled her to undergo placement of a left upper arm AV graft  by Dr. Kellie Simmering 08/08/2008 at [facility].  She likely will have  larger veins hopefully in the upper arm.   Raven Walker, M.D.  Electronically Signed   PGH/MEDQ  D:  08/02/2008  T:  08/03/2008  Job:  [id number]

## 2011-02-17 NOTE — Op Note (Signed)
NAME:  Raven Walker NO.:  [id number]   MEDICAL RECORD NO.:  [id number]          PATIENT TYPE:  AMB   LOCATION:                               FACILITY:  [facility]   PHYSICIAN:  Nelda Severe. Kellie Simmering, M.D.  DATE OF BIRTH:  Jan 18, 1981   DATE OF PROCEDURE:  08/10/2008  DATE OF DISCHARGE:                               OPERATIVE REPORT   PREOPERATIVE DIAGNOSIS:  End-stage renal disease.   POSTOPERATIVE DIAGNOSIS:  End-stage renal disease.   OPERATIONS:  1. Thromboendarterectomy of left brachial artery.  2. Insertion of a left upper arm brachial artery to axillary vein      graft with 6-mm Gore-Tex.   SURGEON:  Nelda Severe. Kellie Simmering, MD   FIRST ASSISTANT:  Jacinta Shoe, PA.   ANESTHESIA:  Local.   PROCEDURE:  The patient was taken to the operating room, placed in a  supine position, at which time the left upper extremity was prepped with  Betadine scrub and solution draped in routine sterile manner.  After  infiltration with 1% Xylocaine with epinephrine, a longitudinal incision  was made in the distal upper arm.  Brachial artery was exposed and the  neurovascular bundle.  There was no pulse palpable in the brachial  artery and no Doppler flow present.  A forearm graft had been placed  about 2 months earlier, which had failed.  Longitudinal opening was made  in the artery in the usual location for insertion of an upper arm graft  and it was filled with organized thrombotic material.  Using a right-  angle clamp and a dissector to mobilize this, an organized plug was  removed, which had extended up about 3 cm proximal to where the  arteriotomy was located, and after removing this, excellent flow was  reestablished.  Fogarty was then passed proximally well up into the  subclavian artery with no stenosis noted.  Attempt was made to pass the  Fogarty distally and it would go about 5 cm and then made obstruction  below the brachial artery.  There was very little  backbleeding.  Attempt  was made to remove any debris possible through this arteriotomy from the  distal vessel.  There was ulnar arterial flow present.  Short  longitudinal incision was made distal to the axilla.  Axillary vein  exposed at the junction where the brachial vein, was ligated distally,  transected.  A curvilinear tunnel was placed on the upper anterior  aspect of the arm after infiltration with 1% Xylocaine.  Gore-Tex  anastomosed end-to-side to the brachial artery and end-to-end to the  axillary vein with 6-0 Prolene.  Clamps then released.  There was good  pulse and thrill in the graft.  There was ulnar and distant radial  arterial flow both with the  graft open and occluded.  No protamine or heparin was given.  The wounds  were irrigated with saline, closed in layers with Vicryl in subcuticular  fashion.  Sterile dressing applied.  The patient was taken to the  recovery room in satisfactory condition.      Nelda Severe Kellie Simmering, M.D.  Electronically Signed  JDL/MEDQ  D:  08/10/2008  T:  08/11/2008  Job:  [id number]

## 2011-02-17 NOTE — Op Note (Signed)
NAME:  Raven Walker, Raven Walker NO.:  [id number]   MEDICAL RECORD NO.:  [id number]          PATIENT TYPE:  AMB   LOCATION:  SDS                          FACILITY:  [facility]   PHYSICIAN:  Theotis Burrow IV, MDDATE OF BIRTH:  1980/12/08   DATE OF PROCEDURE:  11/28/2008  DATE OF DISCHARGE:  11/28/2008                               OPERATIVE REPORT   PREOPERATIVE DIAGNOSIS:  Thrombosed right forearm arteriovenous Gore-Tex  graft.   POSTOPERATIVE DIAGNOSIS:  Thrombosed right forearm arteriovenous Gore-  Tex graft.   PROCEDURE PERFORMED:  Thrombectomy of right forearm arteriovenous Gore-  Tex graft.   ANESTHESIA:  General.   COMPLICATIONS:  None.   BLOOD LOSS:  Minimal.   FINDINGS:  Widely patent arterial and venous anastomosis with no  neointima found.   INDICATIONS:  This is a 30 year old female now here on dialysis who has  previously undergone a left forearm and left upper arm AV Gore-Tex graft  which thrombosed prior to her being able to use these.  I recently put  in a right forearm graft which also thrombosed.  This raised the  question that the patient may have an underlying hypercoagulable state.  After a long discussion with the patient, we have decided to proceed  with thrombectomy of this graft with placing her on Coumadin  postoperatively in an attempt to keep this graft open.   PROCEDURE:  The patient was identified in the holding area and taken to  room 6.  She was placed supine on the table.  Anesthesia was  administered.  The right arm was prepped and draped in standard sterile  fashion.  Time-out was called.  Antibiotics were given. Lidocaine 1% was  used for local anesthesia.  The patient's previous incision in the  antecubital crease was opened with a 10 blade.  A combination of blunt  dissection and cautery dissection were used to expose the arterial and  venous limbs of the graft.  At this point, the patient was given  systemic heparinization.   We made a transverse graftotomy at the level  of the venous anastomosis and passed a #4 Fogarty centrally.  No  resistance was encountered.  The balloon was deflated and withdrawn.  Moderate thrombus was evacuated after one pass of the Fogarty and  backbleeding was established.  There was debris at the venous  anastomosis which was removed under direct vision.  The anastomosis was  found to be widely patent.  Next, I tried to pass a Fogarty catheter  through the graft, however, this met resistance at the arterial  anastomosis.  This was not surprising given that it had been 3 weeks  since her graft had thrombosed.  For that reason, I elected to perform a  graftotomy at the level of the arterial anastomosis.  From here, I was  able to pass the Fogarty into the artery and remove the arterial plug.  There was excellent inflow.  The graft was then thrombectomized with the  Fogarty catheter.  The graft was then irrigated with heparinized saline.  Under direct vision, the residual thrombus was removed from the arterial  anastomosis which  was found to be widely patent.  With this, the  graftotomy in the arterial limb was closed with a running Gore-Tex CV-6  suture.  Similarly, the graftotomy at the venous anastomosis was closed  with CV-6 suture.  Flow through the graft was then reestablished.  It  had a good thrill present.  I did not  reverse the patient's heparin.  The wound was then irrigated and closed  with a layer of 2-0 and encircled layer of 3-0 and 4-0 Vicryl and  Dermabond was placed.  The patient had good signals in her wrist with  Doppler.  She was taken to recovery room in stable condition.  There  were no complications.           ______________________________  V. Leia Alf, MD  Electronically Signed     VWB/MEDQ  D:  11/30/2008  T:  12/01/2008  Job:  [id number]

## 2011-02-17 NOTE — Assessment & Plan Note (Signed)
OFFICE VISIT   Raven Walker, Raven Walker  DOB:  [birth year]                                       03/11/2009  ZGYFV#:[id number]   REASON FOR VISIT:  Followup.   HISTORY:  This is a 30 year old female with chronic kidney disease not  yet on dialysis who has had significant difficulty with graft occlusion.  She has had a left upper arm and forearm graft placed by Dr. Kellie Simmering  which occluded prior to her postoperative visits.  I performed a right  forearm graft and subsequent thrombectomy while on low-dose Coumadin  which occluded.  She was sent to hematology for hypercoagulable workup.  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   I had a lengthy conversation with the patient at this point time  contemplating our options.  I think with the blood work that is  available now she has started on folic acid and if we proceed with  another graft she would need full anticoagulation and possibly a several  day admission in the hospital until she became therapeutic on her  anticoagulation.  I would like for her to inquire more about peritoneal  dialysis to see if she could have this serve as a vehicle for her to  receive dialysis.  If she is a candidate I would recommend proceeding  with peritoneal dialysis.  If not, we would consider right upper arm  graft and immediate full dose anticoagulation.   I think it is important to note these abnormal lab values and her  hypercoagulable workup as this will be relevant should she be accepted  for kidney transplantation.  The patient will call me after seeing Dr.  Jimmy Footman and we  will evaluate out next step.   Raven Abrahams, MD  Electronically Signed   VWB/MEDQ  D:  03/11/2009  Walker:  03/13/2009  Job:  [id number]   cc:   Dr Jimmy Footman

## 2011-02-17 NOTE — Assessment & Plan Note (Signed)
OFFICE VISIT   Raven Walker, Raven Walker  DOB:  1981-04-27                                       12/31/2008  VTVNR#:[id number]   REASON FOR VISIT:  Follow-up.   HISTORY:  This is a 30 year old female with chronic kidney disease not  yet on dialysis.  She has previously undergone graft placement in her  left forearm and left upper arm by Dr. Kellie Simmering, both of which were  occluded at her postoperative visit.  I placed her right forearm graft  on January 15th, which also occluded.  I took her back for a  thrombectomy, I could not find any reason why the graft occluded, I  placed her on Coumadin postoperatively and she comes back in today for  follow-up.  This graft is also occluded.  I am concerned that she has  some undiagnosed hypercoagulable state.  I am going to send her to  hematology for a full hypercoagulable workup.  I am going to see her  back in 6 weeks,  at which time we can continue dialysis access  planning.  She may want to consider peritoneal dialysis and/or be  aggressive about evaluating her for transplant.   Eldridge Abrahams, MD  Electronically Signed   VWB/MEDQ  D:  12/31/2008  Walker:  01/01/2009  Job:  [id number]

## 2011-02-17 NOTE — Consult Note (Signed)
VASCULAR SURGERY CONSULTATION   Raven Walker, Raven Walker  DOB:  15-Oct-1980                                       05/22/2008  PJASN#:[id number]   The patient is a 30 year old female with end-stage renal disease.  She  has a congenitally 1 kidney and has hypertension and had progressive  renal insufficiency over the last few years.  Creatinine has increased  her recent level of 5.  She has not had hemodialysis to date and will be  screened for possible renal transplantation.  She is right-handed.   PHYSICAL EXAM:  Today her blood pressure 120/82, heart rate 68,  respirations 12.  Her carotid pulses 3+.  No audible bruits.  Upper  extremity graft exam reveals 3+ brachial and 2+ radial pulses.  Cephalic  veins appear small on physical exam.  Bilateral vein mapping was  performed in our vascular lab which reveals small cephalic veins  bilaterally in the forearm and arm which are not adequate for AV fistula  creation, unfortunately.   I discussed this with her and have proposed inserting left forearm AV  graft on Wednesday August 26 at [facility] as an outpatient.  Hopefully this will provide satisfactory site for vascular access in  this nice lady.   Nelda Severe Kellie Simmering, M.D.  Electronically Signed  JDL/MEDQ  D:  05/22/2008  T:  05/23/2008  Job:  [id number]   cc:   Jeneen Rinks L. Deterding, M.D.

## 2011-02-17 NOTE — Assessment & Plan Note (Signed)
OFFICE VISIT   Raven Walker, Raven Walker  DOB:  11-Oct-1980                                       10/09/2008  YBRKV#:[id number]   [name] returned today for further evaluation for further  vascular access at Dr. Deterding's request.  He would like to proceed  with a graft at this time.  Even though she is not on dialysis yet, she  is getting closer.  She is not a candidate for fistula and had two  unsuccessful procedures in her left arm.  The cephalic vein in the right  arm is small but hopefully her brachial or deep vein will be adequate  for a right forearm graft.  I discussed this with her and the fact that  this graft may also fail, but because of her age (89) we certainly want  to use her right forearm as the site for access if at all possible.  I  have scheduled this for Friday, January 15 at [facility] as an  outpatient by Dr. Trula Slade.  Hopefully this will provide a satisfactory  site for vascular access in this nice lady.   Nelda Severe Kellie Simmering, M.D.  Electronically Signed   JDL/MEDQ  D:  10/09/2008  T:  10/10/2008  Job:  [id number]   cc:   Jeneen Rinks L. Deterding, M.D.

## 2011-04-13 ENCOUNTER — Ambulatory Visit (INDEPENDENT_AMBULATORY_CARE_PROVIDER_SITE_OTHER): Payer: 59 | Admitting: Surgery

## 2011-05-18 ENCOUNTER — Encounter (INDEPENDENT_AMBULATORY_CARE_PROVIDER_SITE_OTHER): Payer: Self-pay | Admitting: Surgery

## 2011-05-19 ENCOUNTER — Ambulatory Visit (INDEPENDENT_AMBULATORY_CARE_PROVIDER_SITE_OTHER): Payer: Private Health Insurance - Indemnity | Admitting: Surgery

## 2011-05-19 ENCOUNTER — Encounter (INDEPENDENT_AMBULATORY_CARE_PROVIDER_SITE_OTHER): Payer: Self-pay | Admitting: Surgery

## 2011-05-19 DIAGNOSIS — N2581 Secondary hyperparathyroidism of renal origin: Secondary | ICD-10-CM

## 2011-05-19 NOTE — Progress Notes (Signed)
Chief Complaint  Patient presents with  . Other    Eval of secondary hyperparathyroidism, renal failure on peritoneal dialysis    HISTORY: Patient is a 30 year old black female referred by her nephrologist for evaluation of secondary hyperparathyroidism. Patient has been on dialysis since 2010. She had 3 arteriovenous graft placement, all of which thrombosed. She therefore started on peritoneal dialysis which she is managed nicely at home. Unfortunately she has developed secondary hyperparathyroidism. There are marked abnormalities in her phosphorus levels and intact PTH levels. Most recent laboratory studies from May 08, 2011 show a phosphorus of 8.2 and an intact PTH level of 4334.2. The patient now presents to discuss neck exploration and total parathyroidectomy with autotransplant.  Patient is on the renal transplant list at [facility].  Past Medical History  Diagnosis Date  . Hypertension   . Poor appetite      Current Outpatient Prescriptions  Medication Sig Dispense Refill  . amLODipine (NORVASC) 10 MG tablet Take 10 mg by mouth daily.        Marland Kitchen b complex-vitamin c-folic acid (NEPHRO-VITE) 0.8 MG TABS Take 0.8 mg by mouth at bedtime.        . cinacalcet (SENSIPAR) 60 MG tablet Take 60 mg by mouth daily.        Marland Kitchen Doxercalciferol (HECTOROL PO) Take by mouth 3 (three) times daily.        Marland Kitchen epoetin alfa (EPOGEN,PROCRIT) 01093 UNIT/ML injection Inject 20,000 Units into the skin once a week.       . pantoprazole (PROTONIX) 40 MG tablet Take 40 mg by mouth daily.        . Sevelamer Carbonate (RENVELA PO) Take by mouth daily.           Allergies  Allergen Reactions  . Nickel Itching and Rash    Only where touched     History reviewed. No pertinent family history.   History   Social History  . Marital Status: Single    Spouse Name: N/A    Number of Children: N/A  . Years of Education: N/A   Social History Main Topics  . Smoking status: Former Research scientist (life sciences)   . Smokeless tobacco: None  . Alcohol Use: Yes     Once per month  . Drug Use: No  . Sexually Active: None   Other Topics Concern  . None   Social History Narrative  . None     REVIEW OF SYSTEMS - PERTINENT POSITIVES ONLY: Patient has no specific complaints related to her hyperparathyroidism.   EXAM: Filed Vitals:   05/19/11 1546  BP: 134/86  Pulse: 76  Temp: 97.4 F (36.3 C)    HEENT: normocephalic; pupils equal and reactive; sclerae clear; dentition good; mucous membranes moist NECK:  No palpable masses. No thyroid nodularity.; symmetric on extension; no palpable anterior or posterior cervical lymphadenopathy; no supraclavicular masses; no tenderness CHEST: clear to auscultation bilaterally without rales, rhonchi, or wheezes CARDIAC: regular rate and rhythm without significant murmur; peripheral pulses are full EXT:  non-tender without edema; no deformity, previous AV graft placements in both upper extremities NEURO: no gross focal deficits; no sign of tremor   LABORATORY RESULTS: See E-Chart for most recent results   RADIOLOGY RESULTS: See E-Chart or I-Site for most recent results   IMPRESSION: #1-secondary hyperparathyroidism #2-end-stage renal disease on peritoneal dialysis   PLAN: I had a lengthy discussion with the patient regarding the above findings. I explained to her the nature of secondary hyperparathyroidism with 4  gland disease. We discussed total parathyroidectomy with autotransplantation to the forearm. We discussed alternative type surgical procedures. She understands and agrees to proceed. We discussed potential risk including injury to recurrent laryngeal nerves. Patient would like to proceed with surgery in the near future. We will make arrangements in conjunction with her nephrologist.  The risks and benefits of the procedure have been discussed at length with the patient.  The patient understands the proposed procedure, potential alternative  treatments, and the course of recovery to be expected.  All of the patient's questions have been answered at this time.  The patient wishes to proceed with surgery and will schedule a date for their procedure through our office staff.   Earnstine Regal, MD, [facility]      Visit Diagnoses: 1. Hyperparathyroidism, secondary     Primary Care Physician: Wende Neighbors, MD

## 2011-06-06 DIAGNOSIS — K658 Other peritonitis: Secondary | ICD-10-CM

## 2011-06-06 HISTORY — DX: Other peritonitis: K65.8

## 2011-06-10 ENCOUNTER — Other Ambulatory Visit (HOSPITAL_COMMUNITY): Payer: Self-pay

## 2011-06-16 ENCOUNTER — Telehealth (INDEPENDENT_AMBULATORY_CARE_PROVIDER_SITE_OTHER): Payer: Self-pay | Admitting: General Surgery

## 2011-06-16 ENCOUNTER — Encounter (HOSPITAL_COMMUNITY)
Admission: RE | Admit: 2011-06-16 | Discharge: 2011-06-16 | Disposition: A | Discharge status: Home / Self Care | Payer: Managed Care, Other (non HMO) | Source: Ambulatory Visit | Attending: Surgery | Admitting: Surgery

## 2011-06-16 ENCOUNTER — Other Ambulatory Visit (INDEPENDENT_AMBULATORY_CARE_PROVIDER_SITE_OTHER): Payer: Self-pay | Admitting: Surgery

## 2011-06-16 DIAGNOSIS — N19 Unspecified kidney failure: Secondary | ICD-10-CM

## 2011-06-16 LAB — SURGICAL PCR SCREEN
MRSA, PCR: NEGATIVE
Staphylococcus aureus: NEGATIVE

## 2011-06-16 NOTE — Telephone Encounter (Signed)
GLORIA [facility] CALLED TO SEE IF SURGERY NEEDED TO BE POSTPONED DUE TO THE FACT THAT Raven Walker HAD 2 TEETH EXTRACTED ON 06-12-11. SHE HAD EXPERIENCED DRY SOCKET PER DENTIST. SHE IS PRESENTLY BEING TREATED FOR THIS. I [place] AND HE SAID IT SHOULD NOT EFFECT SURGERY SCHEDULED FOR 06-18-11. GLORIA NOTIFIED. [phone].

## 2011-06-24 ENCOUNTER — Other Ambulatory Visit: Payer: Self-pay | Admitting: Pulmonary Disease

## 2011-06-24 ENCOUNTER — Inpatient Hospital Stay (HOSPITAL_COMMUNITY)
Admission: EM | Admit: 2011-06-24 | Discharge: 2011-07-02 | DRG: 371 | Disposition: A | Discharge status: Home / Self Care | Payer: Managed Care, Other (non HMO) | Source: Ambulatory Visit | Attending: Nephrology | Admitting: Nephrology

## 2011-06-24 ENCOUNTER — Encounter (INDEPENDENT_AMBULATORY_CARE_PROVIDER_SITE_OTHER): Payer: Medicare Other | Admitting: Surgery

## 2011-06-24 DIAGNOSIS — N2581 Secondary hyperparathyroidism of renal origin: Secondary | ICD-10-CM | POA: Diagnosis present

## 2011-06-24 DIAGNOSIS — K219 Gastro-esophageal reflux disease without esophagitis: Secondary | ICD-10-CM | POA: Diagnosis present

## 2011-06-24 DIAGNOSIS — D509 Iron deficiency anemia, unspecified: Secondary | ICD-10-CM | POA: Diagnosis present

## 2011-06-24 DIAGNOSIS — N186 End stage renal disease: Secondary | ICD-10-CM | POA: Diagnosis present

## 2011-06-24 DIAGNOSIS — E876 Hypokalemia: Secondary | ICD-10-CM | POA: Diagnosis not present

## 2011-06-24 DIAGNOSIS — E46 Unspecified protein-calorie malnutrition: Secondary | ICD-10-CM | POA: Diagnosis present

## 2011-06-24 DIAGNOSIS — I120 Hypertensive chronic kidney disease with stage 5 chronic kidney disease or end stage renal disease: Secondary | ICD-10-CM | POA: Diagnosis present

## 2011-06-24 DIAGNOSIS — Z79899 Other long term (current) drug therapy: Secondary | ICD-10-CM

## 2011-06-24 DIAGNOSIS — K658 Other peritonitis: Principal | ICD-10-CM | POA: Diagnosis present

## 2011-06-24 LAB — BODY FLUID CELL COUNT WITH DIFFERENTIAL
Lymphs, Fluid: 5 %
Neutrophil Count, Fluid: 90 % — ABNORMAL HIGH (ref 0–25)

## 2011-06-24 LAB — CBC
Hemoglobin: 10.3 g/dL — ABNORMAL LOW (ref 12.0–15.0)
MCHC: 33.4 g/dL (ref 30.0–36.0)
RDW: 14.3 % (ref 11.5–15.5)

## 2011-06-24 LAB — GRAM STAIN

## 2011-06-24 LAB — COMPREHENSIVE METABOLIC PANEL
AST: 13 U/L (ref 0–37)
Albumin: 1.9 g/dL — ABNORMAL LOW (ref 3.5–5.2)
Calcium: 8.5 mg/dL (ref 8.4–10.5)
Creatinine, Ser: 21.23 mg/dL — ABNORMAL HIGH (ref 0.50–1.10)

## 2011-06-24 LAB — DIFFERENTIAL
Basophils Absolute: 0 10*3/uL (ref 0.0–0.1)
Basophils Relative: 0 % (ref 0–1)
Eosinophils Relative: 0 % (ref 0–5)
Monocytes Absolute: 0.4 10*3/uL (ref 0.1–1.0)
Neutro Abs: 5.2 10*3/uL (ref 1.7–7.7)

## 2011-06-24 LAB — PATHOLOGIST SMEAR REVIEW

## 2011-06-24 LAB — PHOSPHORUS: Phosphorus: 9.7 mg/dL — ABNORMAL HIGH (ref 2.3–4.6)

## 2011-06-25 LAB — RENAL FUNCTION PANEL
Albumin: 1.6 g/dL — ABNORMAL LOW (ref 3.5–5.2)
BUN: 61 mg/dL — ABNORMAL HIGH (ref 6–23)
Calcium: 8.4 mg/dL (ref 8.4–10.5)
Phosphorus: 9.3 mg/dL — ABNORMAL HIGH (ref 2.3–4.6)
Potassium: 4.3 mEq/L (ref 3.5–5.1)
Sodium: 138 mEq/L (ref 135–145)

## 2011-06-25 LAB — BODY FLUID CELL COUNT WITH DIFFERENTIAL
Eos, Fluid: 0 %
Monocyte-Macrophage-Serous Fluid: 8 % — ABNORMAL LOW (ref 50–90)
Neutrophil Count, Fluid: 88 % — ABNORMAL HIGH (ref 0–25)

## 2011-06-25 LAB — CBC
HCT: 32 % — ABNORMAL LOW (ref 36.0–46.0)
MCHC: 33.1 g/dL (ref 30.0–36.0)
RDW: 14.5 % (ref 11.5–15.5)

## 2011-06-26 LAB — CBC
HCT: 28.8 % — ABNORMAL LOW (ref 36.0–46.0)
Platelets: 242 10*3/uL (ref 150–400)
RDW: 14.8 % (ref 11.5–15.5)
WBC: 8.2 10*3/uL (ref 4.0–10.5)

## 2011-06-26 LAB — BODY FLUID CELL COUNT WITH DIFFERENTIAL

## 2011-06-26 LAB — RENAL FUNCTION PANEL
Albumin: 1.5 g/dL — ABNORMAL LOW (ref 3.5–5.2)
BUN: 64 mg/dL — ABNORMAL HIGH (ref 6–23)
Chloride: 94 mEq/L — ABNORMAL LOW (ref 96–112)
GFR calc Af Amer: 2 mL/min — ABNORMAL LOW (ref >60)
GFR calc non Af Amer: 2 mL/min — ABNORMAL LOW (ref >60)
Phosphorus: 8.4 mg/dL — ABNORMAL HIGH (ref 2.3–4.6)
Potassium: 3.9 mEq/L (ref 3.5–5.1)
Sodium: 136 mEq/L (ref 135–145)

## 2011-06-26 LAB — PATHOLOGIST SMEAR REVIEW

## 2011-06-27 LAB — BODY FLUID CELL COUNT WITH DIFFERENTIAL
Eos, Fluid: 1 %
Lymphs, Fluid: 8 %
Monocyte-Macrophage-Serous Fluid: 7 % — ABNORMAL LOW (ref 50–90)
Neutrophil Count, Fluid: 89 % — ABNORMAL HIGH (ref 0–25)
Total Nucleated Cell Count, Fluid: 9273 cu mm — ABNORMAL HIGH (ref 0–1000)
Total Nucleated Cell Count, Fluid: 2233 cu mm — ABNORMAL HIGH (ref 0–1000)

## 2011-06-27 LAB — BODY FLUID CULTURE

## 2011-06-28 LAB — BODY FLUID CELL COUNT WITH DIFFERENTIAL
Eos, Fluid: 4 %
Lymphs, Fluid: 5 %
Monocyte-Macrophage-Serous Fluid: 7 % — ABNORMAL LOW (ref 50–90)
Neutrophil Count, Fluid: 84 % — ABNORMAL HIGH (ref 0–25)

## 2011-06-28 LAB — COMPREHENSIVE METABOLIC PANEL
ALT: 6 U/L (ref 0–35)
AST: 8 U/L (ref 0–37)
Albumin: 1.5 g/dL — ABNORMAL LOW (ref 3.5–5.2)
Alkaline Phosphatase: 58 U/L (ref 39–117)
CO2: 32 mEq/L (ref 19–32)
Chloride: 95 mEq/L — ABNORMAL LOW (ref 96–112)
Creatinine, Ser: 14.48 mg/dL — ABNORMAL HIGH (ref 0.50–1.10)
GFR calc non Af Amer: 3 mL/min — ABNORMAL LOW (ref >60)
Potassium: 3.1 mEq/L — ABNORMAL LOW (ref 3.5–5.1)
Total Bilirubin: 0.1 mg/dL — ABNORMAL LOW (ref 0.3–1.2)

## 2011-06-28 LAB — CBC
MCV: 85.7 fL (ref 78.0–100.0)
Platelets: 226 10*3/uL (ref 150–400)
RBC: 3.36 MIL/uL — ABNORMAL LOW (ref 3.87–5.11)
RDW: 14.8 % (ref 11.5–15.5)
WBC: 5.4 10*3/uL (ref 4.0–10.5)

## 2011-06-29 LAB — RENAL FUNCTION PANEL
CO2: 30 mEq/L (ref 19–32)
Chloride: 95 mEq/L — ABNORMAL LOW (ref 96–112)
GFR calc Af Amer: 4 mL/min — ABNORMAL LOW (ref >60)
GFR calc non Af Amer: 3 mL/min — ABNORMAL LOW (ref >60)
Glucose, Bld: 130 mg/dL — ABNORMAL HIGH (ref 70–99)
Potassium: 2.9 mEq/L — ABNORMAL LOW (ref 3.5–5.1)
Sodium: 137 mEq/L (ref 135–145)

## 2011-06-29 LAB — CBC
MCV: 85 fL (ref 78.0–100.0)
Platelets: 234 10*3/uL (ref 150–400)
RBC: 3.19 MIL/uL — ABNORMAL LOW (ref 3.87–5.11)
RDW: 14.6 % (ref 11.5–15.5)
WBC: 4.8 10*3/uL (ref 4.0–10.5)

## 2011-06-29 LAB — BODY FLUID CELL COUNT WITH DIFFERENTIAL
Monocyte-Macrophage-Serous Fluid: 9 % — ABNORMAL LOW (ref 50–90)
Neutrophil Count, Fluid: 78 % — ABNORMAL HIGH (ref 0–25)

## 2011-06-30 LAB — RENAL FUNCTION PANEL
Albumin: 1.6 g/dL — ABNORMAL LOW (ref 3.5–5.2)
BUN: 38 mg/dL — ABNORMAL HIGH (ref 6–23)
Chloride: 96 mEq/L (ref 96–112)
GFR calc non Af Amer: 4 mL/min — ABNORMAL LOW (ref >60)
Potassium: 3.4 mEq/L — ABNORMAL LOW (ref 3.5–5.1)

## 2011-06-30 LAB — BODY FLUID CELL COUNT WITH DIFFERENTIAL
Monocyte-Macrophage-Serous Fluid: 32 % — ABNORMAL LOW (ref 50–90)
Neutrophil Count, Fluid: 53 % — ABNORMAL HIGH (ref 0–25)
Total Nucleated Cell Count, Fluid: 146 cu mm (ref 0–1000)

## 2011-06-30 LAB — IRON AND TIBC
Iron: 19 ug/dL — ABNORMAL LOW (ref 42–135)
TIBC: 112 ug/dL — ABNORMAL LOW (ref 250–470)

## 2011-06-30 LAB — CBC
MCH: 27.3 pg (ref 26.0–34.0)
MCHC: 32 g/dL (ref 30.0–36.0)
MCV: 85.4 fL (ref 78.0–100.0)
Platelets: 305 10*3/uL (ref 150–400)
RBC: 3.55 MIL/uL — ABNORMAL LOW (ref 3.87–5.11)

## 2011-07-01 LAB — RENAL FUNCTION PANEL
Albumin: 1.6 g/dL — ABNORMAL LOW (ref 3.5–5.2)
Chloride: 96 mEq/L (ref 96–112)
GFR calc non Af Amer: 4 mL/min — ABNORMAL LOW (ref >60)
Potassium: 3.2 mEq/L — ABNORMAL LOW (ref 3.5–5.1)
Sodium: 136 mEq/L (ref 135–145)

## 2011-07-01 LAB — BODY FLUID CULTURE

## 2011-07-01 LAB — CULTURE, BLOOD (ROUTINE X 2)
Culture  Setup Time: 201209200351
Culture: NO GROWTH

## 2011-07-01 LAB — BODY FLUID CELL COUNT WITH DIFFERENTIAL: Lymphs, Fluid: 10 %

## 2011-07-01 LAB — CBC
Platelets: 303 10*3/uL (ref 150–400)
RDW: 14.7 % (ref 11.5–15.5)
WBC: 5.8 10*3/uL (ref 4.0–10.5)

## 2011-07-02 LAB — BASIC METABOLIC PANEL
BUN: 26 mg/dL — ABNORMAL HIGH (ref 6–23)
Chloride: 98 mEq/L (ref 96–112)
GFR calc Af Amer: 5 mL/min — ABNORMAL LOW (ref >60)
Glucose, Bld: 113 mg/dL — ABNORMAL HIGH (ref 70–99)
Potassium: 3.3 mEq/L — ABNORMAL LOW (ref 3.5–5.1)

## 2011-07-02 LAB — IRON AND TIBC
Saturation Ratios: 24
UIBC: 219

## 2011-07-02 LAB — CBC
HCT: 27.4 — ABNORMAL LOW
Hemoglobin: 8.9 — ABNORMAL LOW
RDW: 14.8

## 2011-07-02 LAB — FERRITIN: Ferritin: 63 (ref 10–291)

## 2011-07-03 LAB — BODY FLUID CULTURE: Culture: NO GROWTH

## 2011-07-03 LAB — CBC
HCT: 32.2 — ABNORMAL LOW
MCHC: 32.1
MCV: 79.1
Platelets: 224
RDW: 15.8 — ABNORMAL HIGH

## 2011-07-03 LAB — IRON AND TIBC
Iron: 35 — ABNORMAL LOW
TIBC: 267

## 2011-07-03 LAB — FERRITIN: Ferritin: 19 (ref 10–291)

## 2011-07-06 LAB — IRON AND TIBC
Iron: 50
Saturation Ratios: 16 — ABNORMAL LOW
TIBC: 308
UIBC: 258

## 2011-07-06 LAB — HEMOGLOBIN AND HEMATOCRIT, BLOOD: Hemoglobin: 12.5

## 2011-07-07 LAB — POCT I-STAT 4, (NA,K, GLUC, HGB,HCT): Hemoglobin: 12.2

## 2011-07-08 LAB — POCT I-STAT 4, (NA,K, GLUC, HGB,HCT)
Potassium: 4.2
Sodium: 140

## 2011-07-08 LAB — HCG, SERUM, QUALITATIVE: Preg, Serum: NEGATIVE

## 2011-07-09 LAB — IRON AND TIBC
Saturation Ratios: 35 % (ref 20–55)
UIBC: 153 ug/dL

## 2011-07-09 LAB — HEMOGLOBIN AND HEMATOCRIT, BLOOD
HCT: 31.6 % — ABNORMAL LOW (ref 36.0–46.0)
Hemoglobin: 10.2 g/dL — ABNORMAL LOW (ref 12.0–15.0)

## 2011-07-17 LAB — URINALYSIS, ROUTINE W REFLEX MICROSCOPIC
Glucose, UA: NEGATIVE
Ketones, ur: NEGATIVE
Protein, ur: >300 — AB
Urobilinogen, UA: 0.2

## 2011-07-17 LAB — URINE CULTURE: Colony Count: >=100000

## 2011-07-17 LAB — URINE MICROSCOPIC-ADD ON

## 2011-07-30 NOTE — Discharge Summary (Signed)
NAMEMarland Kitchen  KAZARIA, GAERTNER NO.:  [id number]  MEDICAL RECORD NO.:  [id number]  LOCATION:  8502                         FACILITY:  [facility]  PHYSICIAN:  Raven Walker, M.D.DATE OF BIRTH:  [birth year]  DATE OF ADMISSION:  06/24/2011 DATE OF DISCHARGE:  07/02/2011                              DISCHARGE SUMMARY   DISCHARGE DIAGNOSES: 1. Streptococcal pneumonia peritonitis. 2. End-stage renal disease, on chronic continuous peritoneal dialysis. 3. Secondary hyperparathyroidism. 4. Iron deficiency anemia. 5. Hypertension. 6. Gastroesophageal reflux disease. 7. Malnutrition.  PROCEDURES:  Chronic continuous peritoneal dialysis.  HISTORY OF PRESENT ILLNESS:  [name] is a 30 year old African American female with end-stage renal disease secondary to hypertension and history of solitary kidney who is on CCPD and followed by Dr. Jeneen Rinks Walker as an outpatient.  The patient reports the acute onset of abdominal pain, nausea, vomiting, Saturday before admission with low- grade temperatures.  She denied any pain with PD exchanges.  She denied any change in clarity of the fluid.  She was told to use 2.5% exchanges last night secondary to nausea, vomiting.  She was transferred to [facility] from [facility] for further evaluation after 3 rapid exchanges and plan to discharge with IV vancomycin and Fortaz.  On the date of admission, she was however still having increased pain and therefore was admitted for further medical management.  Her initial cell count was 6710 with 90% PMNS.  Please refer to the admission history and physical for further evaluation and details.  ADMISSION LABORATORY DATA:  White count 6.7 with 78% neutrophils, 16% lymphocytes, hemoglobin 10.3, and platelets 184,000.  Sodium 137, potassium 4.5, chloride 75, CO2 24, glucose 112, BUN 57, and creatinine 21.2.  Alkaline phosphatase 65.  Liver function tests within normal limits.   Total protein 5.7, albumin 1.9, calcium 8.5, and phosphorus 9.7.  MRSA PCR was negative.  HOSPITAL COURSE: 1. Streptococcal pneumoniae peritonitis.  The patient was febrile on     admission and spiked a temperature the first evening.  She had     diffuse tenderness on exam.  She was treated with empiric     vancomycin and Fortaz with serial cell counts throughout her     hospitalization.  On the following day, her cell count was essentially     unchanged at 6900 and day 3 noted a slight decrease to 5600.  She     had no improvement in her abdominal pain and did not appear to be     getting better and cell count increased to 9200 on September 22     with 89% segs.  Interestingly, a repeat cell count later day     showed marked decrease to 2233.  The evening of June 27, 2011,     the following day, cell count was down to 2744 which was consistent     with the previous day.  She lost her IV access and was transitioned     to IP Fortaz once the culture grew streptococcal pneumonia.  Vancomycin was discontinued as her organism was pansensitive.     Cell count on June 29, 2011 was 785, repeat 899.  The     June 30, 2011 showed a  marked decrease to 146 and then 51 on     the July 01, 2011.  She still had moderate amount of pain     throughout her hospitalization, even with the improvement of cell     counts.  She required daily Dilaudid usually several doses.  This     was discontinued the day of discharge and we need to demonstrate if     she could tolerate pain with IV Dilaudid.  She will be discharged     on Vicodin 1 tablet q.6 h. as well as a low-dose Phenergan for     intermittent nausea. It is unclear if the nausea is related from     the peritonitis or possibly her narcotic medicines.  She always     requested IV Phenergan in the hospital saying she could not eat.     However, at discharge, we have given her low-dose Phenergan and I     have also written a  prescription for Phenergan suppositories in the     event she is unable to keep down her oral Phenergan.  The patient     is to finish a 3-week course of Fortaz via peritoneal dwell.  She     has an appointment at home training, Friday 28, 2012 at 2:00 p.m.     to learn how to instill this medication. 2. End-stage renal disease, on CCPD.  Per HPI, BUN and creatinine were     elevated on admission and it took several days of PD to show an     improvement in these values. However, after serial dialyses and     improvement in her peritonitis, her BUN and creatinine at discharge     were 26 and 10 respectively.  She did have mild hypokalemia.     During the hospitalization, we repleted with oral and IV KCl.  She     routinely takes oral KCl as an outpatient and given her poor intake     is not surprising that, this number is low.  Her potassium level on     the day of discharge is 3.3 and she will be continued on her usual     KCl dose and encouraged to eat a high potassium diet. 3. Iron deficiency anemia.  Iron studies were checked on June 30, 2011, total iron was 19 with 17% saturation.  Her ferritin level     was 399.  She received 400 mg of IV Venofer during her     hospitalization.  She also received Aranesp 100 mcg on two     occasions.  Hemoglobin at discharge was 9.3.  She will continue her     usual dose of Epogen at discharge, but it may need to be readjusted. 4. Hypertension.  Blood pressure was quite elevated on admission.     This was corrected with hemodialysis and medications.  Blood     pressure at the time of discharge was 113/79.  Her postdialysis     weight after drainage when last checked on June 30, 2011 was     57.9 kg. 5. Malnutrition.  Intake was very poor due to nausea and vomiting.     She is encouraged to eat small frequent meals or snacks.  I am     concerned about her success long-term with peritoneal dialysis due     to her poor nutrition.  She  also is a somewhat picky eater which  makes it that much difficult.  Last albumin before discharge was     1.6. 6. Secondary hyperparathyroidism.  Phosphorus was elevated on     admission.  Her med list has several different forms of phosphate     binders on them.  She, I suspect does not take anything with any     regularity and due to her nausea, vomiting did want to take     anything here plus her intake was variable.  Sevelamer powder was     given with meals when she was willing to take it.  The patient was improved on July 02, 2011 to the extent she was able to be discharged home.  She will follow up with home training tomorrow and have to be followed closely with a followup appointment with Dr. Jimmy Footman in October.  CONDITION:  Improved.  DISPOSITION:  She is discharged to home.  ACTIVITY LEVEL:  As tolerated.  DISCHARGE DIET:  High-protein, high-potassium, low phosphorus.  DISCHARGE MEDICATIONS: 1. Acetaminophen 325 mg q.6 h. p.r.n. pain or fever. 2. Phenergan 12.5 mg 1 tablet every 6 hours p.r.n. nausea. 3. Tressie Ellis 1 gram IP as directed by home training, they will a complete     a full 3-week course of this antibiotic and we will instruct the     patient how to administer it. 4. Amlodipine 10 mg at bedtime. 5. Epogen 20,000 units subcu Sunday. 6. Hectorol 1.5 mcg daily. 7. Hydrocodone/APAP 5/325 one tablet every 6 hours as needed for pain. 8. Potassium chloride 20 mEq 4 tablespoons daily. 9. Protonix 40 mg b.i.d. 10.Nephro-Vite 1 tablet daily. 11.Renvela powder 1 packet 3 times a day with meals.  She is     instructed to stop her PhosLo and Fosrenol.  TIME SPENT:  60 minutes have been spent completing the patient's discharge paperwork, discharge summary, and communicating discharge information to her Dialysis Center.     Alric Seton, P.A.C.   ______________________________ Raven Walker, M.D.    MB/MEDQ  D:  07/02/2011  T:  07/02/2011   Job:  [id number]  Electronically Signed by Alric Seton P.A. on 07/09/2011 12:29:43 PM Electronically Signed by Corliss Parish M.D. on 07/30/2011 06:24:06 AM

## 2011-08-04 ENCOUNTER — Inpatient Hospital Stay (HOSPITAL_COMMUNITY)
Admission: RE | Admit: 2011-08-04 | Discharge: 2011-08-08 | DRG: 674 | Disposition: A | Discharge status: Home / Self Care | Payer: Medicare Other | Source: Ambulatory Visit | Attending: Surgery | Admitting: Surgery

## 2011-08-04 ENCOUNTER — Other Ambulatory Visit (INDEPENDENT_AMBULATORY_CARE_PROVIDER_SITE_OTHER): Payer: Self-pay | Admitting: Surgery

## 2011-08-04 DIAGNOSIS — D649 Anemia, unspecified: Secondary | ICD-10-CM | POA: Diagnosis present

## 2011-08-04 DIAGNOSIS — K219 Gastro-esophageal reflux disease without esophagitis: Secondary | ICD-10-CM | POA: Diagnosis present

## 2011-08-04 DIAGNOSIS — N2581 Secondary hyperparathyroidism of renal origin: Principal | ICD-10-CM | POA: Diagnosis present

## 2011-08-04 DIAGNOSIS — N186 End stage renal disease: Secondary | ICD-10-CM | POA: Diagnosis present

## 2011-08-04 DIAGNOSIS — Z992 Dependence on renal dialysis: Secondary | ICD-10-CM

## 2011-08-04 DIAGNOSIS — E876 Hypokalemia: Secondary | ICD-10-CM | POA: Diagnosis present

## 2011-08-04 DIAGNOSIS — I120 Hypertensive chronic kidney disease with stage 5 chronic kidney disease or end stage renal disease: Secondary | ICD-10-CM | POA: Diagnosis present

## 2011-08-04 LAB — DIFFERENTIAL
Basophils Relative: 1 % (ref 0–1)
Eosinophils Absolute: 0.2 10*3/uL (ref 0.0–0.7)
Monocytes Absolute: 0.2 10*3/uL (ref 0.1–1.0)
Monocytes Relative: 6 % (ref 3–12)
Neutrophils Relative %: 41 % — ABNORMAL LOW (ref 43–77)

## 2011-08-04 LAB — CBC
MCH: 27.3 pg (ref 26.0–34.0)
MCHC: 31.4 g/dL (ref 30.0–36.0)
Platelets: 91 10*3/uL — ABNORMAL LOW (ref 150–400)

## 2011-08-04 LAB — RENAL FUNCTION PANEL
BUN: 28 mg/dL — ABNORMAL HIGH (ref 6–23)
Calcium: 7.1 mg/dL — ABNORMAL LOW (ref 8.4–10.5)
Glucose, Bld: 107 mg/dL — ABNORMAL HIGH (ref 70–99)
Phosphorus: 5.2 mg/dL — ABNORMAL HIGH (ref 2.3–4.6)

## 2011-08-04 LAB — BASIC METABOLIC PANEL
BUN: 26 mg/dL — ABNORMAL HIGH (ref 6–23)
Creatinine, Ser: 11.91 mg/dL — ABNORMAL HIGH (ref 0.50–1.10)
GFR calc Af Amer: 4 mL/min — ABNORMAL LOW (ref >90)
GFR calc non Af Amer: 4 mL/min — ABNORMAL LOW (ref >90)

## 2011-08-04 LAB — PROTIME-INR: Prothrombin Time: 13.9 seconds (ref 11.6–15.2)

## 2011-08-05 HISTORY — PX: PARATHYROIDECTOMY: SHX19

## 2011-08-05 LAB — RENAL FUNCTION PANEL
CO2: 29 mEq/L (ref 19–32)
CO2: 30 mEq/L (ref 19–32)
Calcium: 6.7 mg/dL — ABNORMAL LOW (ref 8.4–10.5)
Chloride: 99 mEq/L (ref 96–112)
Creatinine, Ser: 12.56 mg/dL — ABNORMAL HIGH (ref 0.50–1.10)
Creatinine, Ser: 12.4 mg/dL — ABNORMAL HIGH (ref 0.50–1.10)
GFR calc Af Amer: 4 mL/min — ABNORMAL LOW (ref >90)
GFR calc non Af Amer: 4 mL/min — ABNORMAL LOW (ref >90)
Glucose, Bld: 121 mg/dL — ABNORMAL HIGH (ref 70–99)
Potassium: 3.3 mEq/L — ABNORMAL LOW (ref 3.5–5.1)
Sodium: 142 mEq/L (ref 135–145)

## 2011-08-06 LAB — COMPREHENSIVE METABOLIC PANEL
ALT: <5 U/L (ref 0–35)
AST: 15 U/L (ref 0–37)
Alkaline Phosphatase: 88 U/L (ref 39–117)
CO2: 30 mEq/L (ref 19–32)
Chloride: 100 mEq/L (ref 96–112)
GFR calc Af Amer: 4 mL/min — ABNORMAL LOW (ref >90)
GFR calc non Af Amer: 3 mL/min — ABNORMAL LOW (ref >90)
Glucose, Bld: 112 mg/dL — ABNORMAL HIGH (ref 70–99)
Potassium: 3.5 mEq/L (ref 3.5–5.1)
Sodium: 142 mEq/L (ref 135–145)
Total Bilirubin: 0.1 mg/dL — ABNORMAL LOW (ref 0.3–1.2)

## 2011-08-06 LAB — RENAL FUNCTION PANEL
Albumin: 1.9 g/dL — ABNORMAL LOW (ref 3.5–5.2)
BUN: 27 mg/dL — ABNORMAL HIGH (ref 6–23)
Creatinine, Ser: 13.12 mg/dL — ABNORMAL HIGH (ref 0.50–1.10)
Glucose, Bld: 102 mg/dL — ABNORMAL HIGH (ref 70–99)
Phosphorus: 5.2 mg/dL — ABNORMAL HIGH (ref 2.3–4.6)

## 2011-08-06 LAB — CBC
Hemoglobin: 9.5 g/dL — ABNORMAL LOW (ref 12.0–15.0)
MCH: 27.7 pg (ref 26.0–34.0)
RBC: 3.43 MIL/uL — ABNORMAL LOW (ref 3.87–5.11)
WBC: 6.6 10*3/uL (ref 4.0–10.5)

## 2011-08-06 NOTE — Op Note (Signed)
NAMEMarland Kitchen  Raven Walker, CODD.:  [id number]  MEDICAL RECORD NO.:  [id number]  LOCATION:  2899                         FACILITY:  [facility]  PHYSICIAN:  Earnstine Regal, MD      DATE OF BIRTH:  22-Jul-1981  DATE OF PROCEDURE:  08/04/2011                              OPERATIVE REPORT   PREOPERATIVE DIAGNOSES:  Secondary hyperparathyroidism, end-stage renal disease.  POSTOPERATIVE DIAGNOSES:  Secondary hyperparathyroidism, end-stage renal disease.  PROCEDURE: 1. Total parathyroidectomy (4 glands). 2. Autotransplantation parathyroid tissue to left brachioradialis     muscle.  SURGEON:  Earnstine Regal, MD, FACS  ASSISTANT:  Edsel Petrin. Dalbert Batman, MD, FACS  ANESTHESIA:  General per Dr. Rica Koyanagi.  ESTIMATED BLOOD LOSS:  Minimal.  PREPARATION:  ChloraPrep.  COMPLICATIONS:  None.  INDICATIONS:  The patient is a 30 year old black female with end-stage renal disease on peritoneal dialysis.  She has developed biochemical evidence of secondary hyperparathyroidism.  Phos first level exceeds 8.2.  Intact PTH level is elevated at 4334.  The patient now comes to surgery for total parathyroidectomy with autotransplantation.  BODY OF REPORT:  Procedure was done in OR #2 at the [place].  The patient was brought to the operating room, placed in a supine position on the operating room table.  Following administration of general anesthesia, the patient was positioned and then prepped and draped in the usual strict aseptic fashion.  After ascertaining that an adequate level of anesthesia had been achieved, a Kocher incision was made with a #15 blade.  Dissection was carried through subcutaneous tissues and platysma.  Hemostasis was obtained with electrocautery.  Skin flaps were elevated cephalad and caudad from the thyroid notch to the sternal notch.  A Mahorner self-retaining retractor was placed for exposure.  Strap muscles were incised in the  midline. Dissection was begun on the left side.  Strap muscles were reflected laterally exposing the left thyroid lobe.  It was gently dissected out. Vascular tributaries were divided between small Ligaclips allowing for mobilization of the left lobe.  Exploration reveals an enlarged parathyroid gland at the left inferior position located at the inferior pole of the thyroid.  This was gently dissected out.  Vascular structures divided between small Ligaclips.  Gland was excised in its entirety.  A small section of the gland was excised and submitted this for frozen section biopsy.  Pathology confirms hyperplastic parathyroid tissue.  Remainder of the gland was placed in iced saline on the back table.  Further dissection in the left neck reveals an abnormally enlarged left superior parathyroid gland located just above the level of the inferior thyroid artery.  This was gently dissected out.  Vascular pedicles divided between small Ligaclips and the gland was excised in its entirety.  A fragment of the gland was submitted for frozen section again confirming hyperplastic parathyroid tissue.  The remainder of the gland was placed in iced saline on the back table.  Dry pack was placed in the left neck.  Next, we turned our attention to the right side.  Strap muscles were again reflected laterally and the right thyroid lobe was gently dissected out with blunt dissection.  Vascular tributaries were divided between Ligaclips allowing  for mobilization of the right thyroid lobe. At the inferior pole of the right lobe was an enlarged parathyroid gland.  This was gently dissected out.  Vascular pedicle divided between small Ligaclips.  Gland was excised in its entirety and a segment of the gland was submitted to Pathology.  Frozen section confirmed hypercellular parathyroid tissue.  Remainder of the gland was placed in iced saline on the back table.  Further dissection fails to reveal an obvious  superior gland.  However, on palpation there is a palpable gland in the paraesophageal region located just below the level of the inferior thyroid artery.  It was dissected out taking care to avoid 2 main trunks of the recurrent laryngeal nerve.  The gland was delivered up into the wound.  The vascular pedicle extended beneath the nerve and extended cephalad representing a distended superior parathyroid gland.  Vascular pedicles divided between Ligaclips and the gland was excised.  A fragment of the gland was submitted to Pathology and frozen section biopsy confirmed parathyroid tissue.  Remainder of the gland was placed in iced saline on the back table.  Neck was irrigated bilaterally and good hemostasis was achieved. Surgicel was placed in the operative field bilaterally.  Strap muscles were reapproximated in the midline with interrupted 3-0 Vicryl sutures. Platysma was closed with interrupted 3-0 Vicryl sutures.  Skin was closed with a running 4-0 Monocryl subcuticular suture.  Wound was washed and dried and Steri-Strips were applied.  Sterile dressings were applied.  Next, the left arm was placed on an armboard extended at the side.  The forearm was prepped and draped in the usual strict aseptic fashion.  An incision was made over the brachioradialis muscle for 4 cm with a #15 blade.  Subcutaneous flaps were raised circumferentially and a small Weitlaner retractor placed for exposure.  The left superior parathyroid gland was selected.  It was cut into 1 mm fragments.  Nine fragments are inserted into the brachioradialis muscle by incising the muscle fascia, creating a muscular pocket with a hemostatic, inserting a 1 mm fragment, and closing the overlying fascia with interrupted 4-0 Prolene suture. This exercise was repeated a total of 9 times.  Subcutaneous tissues were then closed with interrupted 3-0 Vicryl sutures.  Skin was closed with a running 4-0 Monocryl subcuticular  suture.  Wound was washed and dried and benzoin and Steri-Strips were applied.  Sterile dressings were applied.  The patient was awakened from anesthesia and brought to the recovery room.  The patient tolerated the procedure well.   Earnstine Regal, MD, FACS     TMG/MEDQ  D:  08/04/2011  T:  08/04/2011  Job:  [id number]  Electronically Signed by Armandina Gemma MD on 08/06/2011 12:37:22 PM

## 2011-08-07 LAB — RENAL FUNCTION PANEL
CO2: 29 mEq/L (ref 19–32)
Chloride: 100 mEq/L (ref 96–112)
Creatinine, Ser: 12.21 mg/dL — ABNORMAL HIGH (ref 0.50–1.10)
GFR calc Af Amer: 4 mL/min — ABNORMAL LOW (ref >90)
GFR calc non Af Amer: 4 mL/min — ABNORMAL LOW (ref >90)

## 2011-08-08 LAB — RENAL FUNCTION PANEL
Albumin: 2.1 g/dL — ABNORMAL LOW (ref 3.5–5.2)
BUN: 25 mg/dL — ABNORMAL HIGH (ref 6–23)
Chloride: 100 mEq/L (ref 96–112)
GFR calc Af Amer: 4 mL/min — ABNORMAL LOW (ref >90)
GFR calc non Af Amer: 3 mL/min — ABNORMAL LOW (ref >90)
Potassium: 3.8 mEq/L (ref 3.5–5.1)

## 2011-08-08 MED ORDER — PROMETHAZINE HCL 25 MG PO TABS: 12.5000 mg | ORAL_TABLET | Freq: Four times a day (QID) | ORAL | Status: DC | PRN

## 2011-08-08 MED ORDER — CALCIUM GLUCONATE 10 % IV SOLN
1.0000 g | Freq: Once | INTRAVENOUS | Status: DC | PRN
  Filled 2011-08-08: qty 10

## 2011-08-08 MED ORDER — POTASSIUM CHLORIDE CRYS ER 20 MEQ PO TBCR: 20.0000 meq | EXTENDED_RELEASE_TABLET | Freq: Every day | ORAL | Status: DC

## 2011-08-08 MED ORDER — ZOLPIDEM TARTRATE 5 MG PO TABS: 5.0000 mg | ORAL_TABLET | Freq: Every evening | ORAL | Status: DC | PRN

## 2011-08-08 MED ORDER — ACETAMINOPHEN 650 MG RE SUPP: 650.0000 mg | Freq: Four times a day (QID) | RECTAL | Status: DC | PRN

## 2011-08-08 MED ORDER — AMLODIPINE BESYLATE 10 MG PO TABS
10.0000 mg | ORAL_TABLET | Freq: Every day | ORAL | Status: DC
  Filled 2011-08-08 (×2): qty 1

## 2011-08-08 MED ORDER — PANTOPRAZOLE SODIUM 40 MG PO TBEC: 40.0000 mg | DELAYED_RELEASE_TABLET | Freq: Two times a day (BID) | ORAL | Status: DC

## 2011-08-08 MED ORDER — CALCIUM CARBONATE 1250 (500 CA) MG PO TABS
1500.0000 mg | ORAL_TABLET | Freq: Three times a day (TID) | ORAL | Status: DC
  Filled 2011-08-08 (×6): qty 3

## 2011-08-08 MED ORDER — NEPHRO-VITE 0.8 MG PO TABS
1.0000 | ORAL_TABLET | Freq: Every day | ORAL | Status: DC
  Filled 2011-08-08 (×2): qty 1

## 2011-08-08 MED ORDER — HEPARIN SODIUM (PORCINE) 1000 UNIT/ML IJ SOLN
1500.0000 [IU] | INTRAMUSCULAR | Status: DC | PRN
  Filled 2011-08-08: qty 2.5

## 2011-08-08 MED ORDER — CAMPHOR-MENTHOL 0.5-0.5 % EX LOTN: 1.0000 "application " | TOPICAL_LOTION | CUTANEOUS | Status: DC | PRN

## 2011-08-08 MED ORDER — ACETAMINOPHEN 325 MG PO TABS: 650.0000 mg | ORAL_TABLET | ORAL | Status: DC | PRN

## 2011-08-08 MED ORDER — MORPHINE SULFATE 2 MG/ML IJ SOLN: 1.0000 mg | INTRAMUSCULAR | Status: DC | PRN

## 2011-08-08 MED ORDER — PROMETHAZINE HCL 25 MG RE SUPP: 25.0000 mg | Freq: Two times a day (BID) | RECTAL | Status: DC | PRN

## 2011-08-08 MED ORDER — CALCIUM CARBONATE 1250 MG/5ML PO SUSP: 500.0000 mg | Freq: Four times a day (QID) | ORAL | Status: DC | PRN

## 2011-08-08 MED ORDER — SORBITOL 70 % SOLN: 30.0000 mL | Status: DC | PRN

## 2011-08-08 MED ORDER — DOXERCALCIFEROL 2.5 MCG PO CAPS
4.0000 ug | ORAL_CAPSULE | Freq: Every day | ORAL | Status: DC
  Filled 2011-08-08: qty 3

## 2011-08-08 MED ORDER — DOCUSATE SODIUM 283 MG RE ENEM: 1.0000 | ENEMA | RECTAL | Status: DC | PRN

## 2011-08-08 MED ORDER — DARBEPOETIN ALFA-POLYSORBATE 200 MCG/0.4ML IJ SOLN: 200.0000 ug | INTRAMUSCULAR | Status: DC

## 2011-08-08 MED ORDER — HYDROXYZINE HCL 25 MG PO TABS: 25.0000 mg | ORAL_TABLET | Freq: Three times a day (TID) | ORAL | Status: DC | PRN

## 2011-08-08 MED ORDER — DELFLEX-LC/2.5% DEXTROSE 394 MOSM/L IP SOLN: INTRAPERITONEAL | Status: DC | PRN

## 2011-08-08 MED ORDER — NEPRO/CARBSTEADY PO LIQD: 237.0000 mL | Freq: Three times a day (TID) | ORAL | Status: DC | PRN

## 2011-08-08 MED ORDER — CALCIUM CARBONATE 1250 (500 CA) MG PO TABS
1000.0000 mg | ORAL_TABLET | Freq: Every day | ORAL | Status: DC
  Filled 2011-08-08 (×2): qty 2

## 2011-08-08 MED ORDER — HYDROCODONE-ACETAMINOPHEN 5-325 MG PO TABS: 1.0000 | ORAL_TABLET | ORAL | Status: DC | PRN

## 2011-08-10 LAB — DIFFERENTIAL
Lymphocytes Relative: 48 % — ABNORMAL HIGH (ref 12–46)
Lymphs Abs: 2.3 10*3/uL (ref 0.7–4.0)
Neutro Abs: 2 10*3/uL (ref 1.7–7.7)
Neutrophils Relative %: 42 % — ABNORMAL LOW (ref 43–77)

## 2011-08-10 LAB — CBC
Platelets: 171 10*3/uL (ref 150–400)
RBC: 4.11 MIL/uL (ref 3.87–5.11)
WBC: 4.8 10*3/uL (ref 4.0–10.5)

## 2011-08-19 ENCOUNTER — Inpatient Hospital Stay (HOSPITAL_COMMUNITY)
Admission: EM | Admit: 2011-08-19 | Discharge: 2011-08-23 | DRG: 640 | Disposition: A | Discharge status: Home / Self Care | Payer: Medicare Other | Source: Ambulatory Visit | Attending: Internal Medicine | Admitting: Internal Medicine

## 2011-08-19 ENCOUNTER — Other Ambulatory Visit: Payer: Self-pay

## 2011-08-19 ENCOUNTER — Encounter (HOSPITAL_COMMUNITY): Payer: Self-pay | Admitting: Emergency Medicine

## 2011-08-19 DIAGNOSIS — I959 Hypotension, unspecified: Secondary | ICD-10-CM | POA: Diagnosis present

## 2011-08-19 DIAGNOSIS — Z9889 Other specified postprocedural states: Secondary | ICD-10-CM

## 2011-08-19 DIAGNOSIS — R209 Unspecified disturbances of skin sensation: Secondary | ICD-10-CM | POA: Diagnosis present

## 2011-08-19 DIAGNOSIS — N2581 Secondary hyperparathyroidism of renal origin: Secondary | ICD-10-CM | POA: Diagnosis present

## 2011-08-19 DIAGNOSIS — K219 Gastro-esophageal reflux disease without esophagitis: Secondary | ICD-10-CM | POA: Diagnosis present

## 2011-08-19 DIAGNOSIS — N189 Chronic kidney disease, unspecified: Secondary | ICD-10-CM

## 2011-08-19 DIAGNOSIS — M899 Disorder of bone, unspecified: Secondary | ICD-10-CM | POA: Diagnosis present

## 2011-08-19 DIAGNOSIS — E860 Dehydration: Secondary | ICD-10-CM

## 2011-08-19 DIAGNOSIS — Q602 Renal agenesis, unspecified: Secondary | ICD-10-CM

## 2011-08-19 DIAGNOSIS — D638 Anemia in other chronic diseases classified elsewhere: Secondary | ICD-10-CM | POA: Diagnosis present

## 2011-08-19 DIAGNOSIS — M949 Disorder of cartilage, unspecified: Secondary | ICD-10-CM | POA: Diagnosis present

## 2011-08-19 DIAGNOSIS — I10 Essential (primary) hypertension: Secondary | ICD-10-CM

## 2011-08-19 DIAGNOSIS — I120 Hypertensive chronic kidney disease with stage 5 chronic kidney disease or end stage renal disease: Secondary | ICD-10-CM | POA: Diagnosis present

## 2011-08-19 DIAGNOSIS — N186 End stage renal disease: Secondary | ICD-10-CM | POA: Diagnosis present

## 2011-08-19 DIAGNOSIS — N179 Acute kidney failure, unspecified: Secondary | ICD-10-CM | POA: Diagnosis present

## 2011-08-19 DIAGNOSIS — E43 Unspecified severe protein-calorie malnutrition: Secondary | ICD-10-CM | POA: Diagnosis present

## 2011-08-19 DIAGNOSIS — E875 Hyperkalemia: Secondary | ICD-10-CM

## 2011-08-19 HISTORY — DX: Dependence on renal dialysis: Z99.2

## 2011-08-19 HISTORY — DX: End stage renal disease: N18.6

## 2011-08-19 HISTORY — DX: Secondary hyperparathyroidism of renal origin: N25.81

## 2011-08-19 HISTORY — DX: Gastro-esophageal reflux disease without esophagitis: K21.9

## 2011-08-19 HISTORY — DX: Other peritonitis: K65.8

## 2011-08-19 LAB — COMPREHENSIVE METABOLIC PANEL
ALT: 5 U/L (ref 0–35)
ALT: <5 U/L (ref 0–35)
AST: 17 U/L (ref 0–37)
Alkaline Phosphatase: 211 U/L — ABNORMAL HIGH (ref 39–117)
BUN: 22 mg/dL (ref 6–23)
CO2: 35 mEq/L — ABNORMAL HIGH (ref 19–32)
CO2: 35 mEq/L — ABNORMAL HIGH (ref 19–32)
Calcium: 6.2 mg/dL — CL (ref 8.4–10.5)
Calcium: 6 mg/dL — CL (ref 8.4–10.5)
Chloride: 96 mEq/L (ref 96–112)
Creatinine, Ser: 14.51 mg/dL — ABNORMAL HIGH (ref 0.50–1.10)
GFR calc Af Amer: 3 mL/min — ABNORMAL LOW (ref >90)
GFR calc Af Amer: 3 mL/min — ABNORMAL LOW (ref >90)
GFR calc non Af Amer: 3 mL/min — ABNORMAL LOW (ref >90)
GFR calc non Af Amer: 3 mL/min — ABNORMAL LOW (ref >90)
Glucose, Bld: 92 mg/dL (ref 70–99)
Glucose, Bld: 88 mg/dL (ref 70–99)
Sodium: 138 mEq/L (ref 135–145)
Sodium: 138 mEq/L (ref 135–145)
Total Bilirubin: 0.1 mg/dL — ABNORMAL LOW (ref 0.3–1.2)
Total Protein: 4.8 g/dL — ABNORMAL LOW (ref 6.0–8.3)

## 2011-08-19 LAB — CBC
HCT: 32 % — ABNORMAL LOW (ref 36.0–46.0)
Hemoglobin: 9.7 g/dL — ABNORMAL LOW (ref 12.0–15.0)
MCH: 27.6 pg (ref 26.0–34.0)
MCHC: 31.3 g/dL (ref 30.0–36.0)
MCV: 88.4 fL (ref 78.0–100.0)
Platelets: 240 10*3/uL (ref 150–400)
RBC: 3.62 MIL/uL — ABNORMAL LOW (ref 3.87–5.11)
RDW: 16.4 % — ABNORMAL HIGH (ref 11.5–15.5)
WBC: 5.3 10*3/uL (ref 4.0–10.5)

## 2011-08-19 LAB — RENAL FUNCTION PANEL
Albumin: 1.8 g/dL — ABNORMAL LOW (ref 3.5–5.2)
Albumin: 1.7 g/dL — ABNORMAL LOW (ref 3.5–5.2)
BUN: 23 mg/dL (ref 6–23)
CO2: 35 mEq/L — ABNORMAL HIGH (ref 19–32)
Calcium: 6.8 mg/dL — ABNORMAL LOW (ref 8.4–10.5)
Chloride: 97 mEq/L (ref 96–112)
Chloride: 97 mEq/L (ref 96–112)
Creatinine, Ser: 14.61 mg/dL — ABNORMAL HIGH (ref 0.50–1.10)
Creatinine, Ser: 14.92 mg/dL — ABNORMAL HIGH (ref 0.50–1.10)
GFR calc Af Amer: 3 mL/min — ABNORMAL LOW (ref >90)
GFR calc non Af Amer: 3 mL/min — ABNORMAL LOW (ref >90)
Potassium: 5.4 mEq/L — ABNORMAL HIGH (ref 3.5–5.1)
Sodium: 137 mEq/L (ref 135–145)

## 2011-08-19 LAB — BASIC METABOLIC PANEL
CO2: 34 mEq/L — ABNORMAL HIGH (ref 19–32)
Chloride: 96 mEq/L (ref 96–112)
GFR calc Af Amer: 4 mL/min — ABNORMAL LOW (ref >90)
Sodium: 138 mEq/L (ref 135–145)

## 2011-08-19 LAB — MRSA PCR SCREENING: MRSA by PCR: NEGATIVE

## 2011-08-19 LAB — MAGNESIUM: Magnesium: 1.9 mg/dL (ref 1.5–2.5)

## 2011-08-19 LAB — DIFFERENTIAL
Eosinophils Relative: 3 % (ref 0–5)
Lymphocytes Relative: 44 % (ref 12–46)
Lymphs Abs: 2.3 10*3/uL (ref 0.7–4.0)
Neutro Abs: 2.5 10*3/uL (ref 1.7–7.7)

## 2011-08-19 LAB — CALCIUM, IONIZED: Calcium, Ion: 0.76 mmol/L — ABNORMAL LOW (ref 1.12–1.32)

## 2011-08-19 MED ORDER — SODIUM CHLORIDE 0.9 % IJ SOLN
125.0000 mg | INTRAVENOUS | Status: DC
  Filled 2011-08-19: qty 10

## 2011-08-19 MED ORDER — ACETAMINOPHEN 325 MG PO TABS: 650.0000 mg | ORAL_TABLET | Freq: Four times a day (QID) | ORAL | Status: DC | PRN

## 2011-08-19 MED ORDER — SEVELAMER CARBONATE 800 MG PO TABS
2400.0000 mg | ORAL_TABLET | ORAL | Status: DC
  Filled 2011-08-19: qty 3

## 2011-08-19 MED ORDER — SORBITOL 70 % SOLN
30.0000 mL | Status: DC | PRN
  Filled 2011-08-19: qty 30

## 2011-08-19 MED ORDER — PANTOPRAZOLE SODIUM 40 MG PO TBEC
40.0000 mg | DELAYED_RELEASE_TABLET | Freq: Two times a day (BID) | ORAL | Status: DC
  Administered 2011-08-19 – 2011-08-23 (×9): 40 mg via ORAL
  Filled 2011-08-19 (×9): qty 1

## 2011-08-19 MED ORDER — CAMPHOR-MENTHOL 0.5-0.5 % EX LOTN
1.0000 "application " | TOPICAL_LOTION | Freq: Three times a day (TID) | CUTANEOUS | Status: DC | PRN
  Filled 2011-08-19: qty 222

## 2011-08-19 MED ORDER — HYDROXYZINE HCL 25 MG PO TABS
25.0000 mg | ORAL_TABLET | Freq: Three times a day (TID) | ORAL | Status: DC | PRN
  Filled 2011-08-19: qty 1

## 2011-08-19 MED ORDER — SODIUM CHLORIDE 0.9 % IV SOLN
1.0000 mg/kg/h | INTRAVENOUS | Status: AC
  Administered 2011-08-19: 1 mg/kg/h via INTRAVENOUS
  Filled 2011-08-19: qty 100

## 2011-08-19 MED ORDER — PROMETHAZINE HCL 25 MG RE SUPP: 25.0000 mg | Freq: Two times a day (BID) | RECTAL | Status: DC | PRN

## 2011-08-19 MED ORDER — DOXERCALCIFEROL 0.5 MCG PO CAPS: 1.0000 ug | ORAL_CAPSULE | Freq: Three times a day (TID) | ORAL | Status: DC

## 2011-08-19 MED ORDER — RENA-VITE PO TABS
1.0000 | ORAL_TABLET | Freq: Every day | ORAL | Status: DC
  Administered 2011-08-19: 1 via ORAL

## 2011-08-19 MED ORDER — NEPRO/CARBSTEADY PO LIQD
237.0000 mL | Freq: Three times a day (TID) | ORAL | Status: DC | PRN
  Filled 2011-08-19: qty 237

## 2011-08-19 MED ORDER — OXYCODONE HCL 5 MG PO TABS
5.0000 mg | ORAL_TABLET | Freq: Once | ORAL | Status: AC
  Administered 2011-08-19: 5 mg via ORAL
  Filled 2011-08-19: qty 1

## 2011-08-19 MED ORDER — DOXERCALCIFEROL 2.5 MCG PO CAPS
2.5000 ug | ORAL_CAPSULE | Freq: Two times a day (BID) | ORAL | Status: DC
  Administered 2011-08-19 (×2): 2.5 ug via ORAL
  Filled 2011-08-19 (×3): qty 1

## 2011-08-19 MED ORDER — RENA-VITE PO TABS
1.0000 | ORAL_TABLET | Freq: Once | ORAL | Status: DC
  Filled 2011-08-19: qty 1

## 2011-08-19 MED ORDER — HEPARIN SODIUM (PORCINE) 5000 UNIT/ML IJ SOLN
5000.0000 [IU] | Freq: Three times a day (TID) | INTRAMUSCULAR | Status: DC
  Administered 2011-08-19 – 2011-08-23 (×14): 5000 [IU] via SUBCUTANEOUS
  Filled 2011-08-19 (×3): qty 1

## 2011-08-19 MED ORDER — ACETAMINOPHEN 650 MG RE SUPP: 650.0000 mg | Freq: Four times a day (QID) | RECTAL | Status: DC | PRN

## 2011-08-19 MED ORDER — ZOLPIDEM TARTRATE 5 MG PO TABS: 5.0000 mg | ORAL_TABLET | Freq: Every evening | ORAL | Status: DC | PRN

## 2011-08-19 MED ORDER — ONDANSETRON HCL 4 MG/2ML IJ SOLN: 4.0000 mg | Freq: Once | INTRAMUSCULAR | Status: DC

## 2011-08-19 MED ORDER — PROMETHAZINE HCL 25 MG PO TABS
12.5000 mg | ORAL_TABLET | Freq: Four times a day (QID) | ORAL | Status: DC | PRN
  Filled 2011-08-19: qty 1

## 2011-08-19 MED ORDER — SODIUM CHLORIDE 0.9 % IV SOLN
4.0000 g | Freq: Once | INTRAVENOUS | Status: DC
  Filled 2011-08-19: qty 40

## 2011-08-19 MED ORDER — SODIUM CHLORIDE 0.9 % IV SOLN: 4.0000 g | Freq: Once | INTRAVENOUS | Status: DC

## 2011-08-19 MED ORDER — SEVELAMER CARBONATE 800 MG PO TABS: 2400.0000 mg | ORAL_TABLET | Freq: Three times a day (TID) | ORAL | Status: DC

## 2011-08-19 MED ORDER — CINACALCET HCL 30 MG PO TABS
60.0000 mg | ORAL_TABLET | Freq: Once | ORAL | Status: DC
  Filled 2011-08-19: qty 2

## 2011-08-19 MED ORDER — DARBEPOETIN ALFA-POLYSORBATE 150 MCG/0.3ML IJ SOLN
150.0000 ug | INTRAMUSCULAR | Status: DC
  Administered 2011-08-19: 150 ug via SUBCUTANEOUS
  Filled 2011-08-19: qty 0.3

## 2011-08-19 MED ORDER — CALCIUM CARBONATE 1250 MG/5ML PO SUSP
1000.0000 mg | Freq: Once | ORAL | Status: AC
  Administered 2011-08-19: 1000 mg via ORAL
  Filled 2011-08-19: qty 10

## 2011-08-19 MED ORDER — CALCIUM CARBONATE 1250 MG/5ML PO SUSP: 500.0000 mg | Freq: Four times a day (QID) | ORAL | Status: DC | PRN

## 2011-08-19 MED ORDER — DOCUSATE SODIUM 283 MG RE ENEM
1.0000 | ENEMA | RECTAL | Status: DC | PRN
  Filled 2011-08-19: qty 1

## 2011-08-19 MED ORDER — CALCIUM CARBONATE 1250 MG/5ML PO SUSP
1000.0000 mg | Freq: Four times a day (QID) | ORAL | Status: DC
  Administered 2011-08-19 – 2011-08-20 (×5): 1000 mg via ORAL
  Filled 2011-08-19 (×10): qty 10

## 2011-08-19 MED ORDER — SEVELAMER CARBONATE 2.4 G PO PACK: 2.4000 g | PACK | Freq: Three times a day (TID) | ORAL | Status: DC

## 2011-08-19 MED ORDER — DOXERCALCIFEROL 0.5 MCG PO CAPS
1.0000 ug | ORAL_CAPSULE | Freq: Once | ORAL | Status: DC
  Filled 2011-08-19: qty 2

## 2011-08-19 MED ORDER — OXYCODONE HCL 5 MG PO TABS
5.0000 mg | ORAL_TABLET | Freq: Four times a day (QID) | ORAL | Status: DC | PRN
  Administered 2011-08-19 – 2011-08-22 (×4): 5 mg via ORAL
  Filled 2011-08-19 (×4): qty 1

## 2011-08-19 MED ORDER — SODIUM CHLORIDE 0.9 % IV SOLN
4.0000 g | Freq: Once | INTRAVENOUS | Status: AC
  Administered 2011-08-19: 4 g via INTRAVENOUS
  Filled 2011-08-19: qty 40

## 2011-08-19 MED ORDER — CINACALCET HCL 30 MG PO TABS: 60.0000 mg | ORAL_TABLET | Freq: Two times a day (BID) | ORAL | Status: DC

## 2011-08-19 NOTE — ED Notes (Signed)
[facility] Team changed from Dr Bennett Scrape [facility]) to Dr Karleen Hampshire [facility])

## 2011-08-19 NOTE — ED Notes (Signed)
Patient is resting comfortably. 

## 2011-08-19 NOTE — Progress Notes (Signed)
Subjective: Denies any vomiting or nausea. Comfortable. No tingling or numbness. No palpitations. Objective: Vital signs in last 24 hours: Filed Vitals:   08/19/11 1113 08/19/11 1206 08/19/11 1345 08/19/11 1447  BP: 117/82 121/90 124/85 114/79  Pulse: 60 63 61 69  Temp:  98.2 F (36.8 C)  98.4 F (36.9 C)  TempSrc:  Oral  Oral  Resp: $Remo'16 16 16 15  'gmxPC$ Height:      Weight:      SpO2: 100% 100% 100% 100%   Weight change:  No intake or output data in the 24 hours ending 08/19/11 1626 Physical Exam:   General Appearance:    Alert, cooperative, no distress, comfortable  Lungs:     Clear to auscultation bilaterally, respirations unlabored   Heart:    Regular rate and rhythm, S1 and S2 normal,   Abdomen:     Soft, non-tender, bowel sounds active all four quadrants,    no masses, no organomegaly  Extremities:   Extremities normal, atraumatic, no cyanosis or edema              Lab Results: Results for orders placed during the hospital encounter of 08/19/11 (from the past 24 hour(s))  CALCIUM, IONIZED     Status: Abnormal   Collection Time   08/19/11  4:00 AM      Component Value Range   Calcium, Ion 0.76 (*) 1.12 - 1.32 (mmol/L)  BASIC METABOLIC PANEL     Status: Abnormal   Collection Time   08/19/11  4:00 AM      Component Value Range   Sodium 138  135 - 145 (mEq/L)   Potassium 5.6 (*) 3.5 - 5.1 (mEq/L)   Chloride 96  96 - 112 (mEq/L)   CO2 34 (*) 19 - 32 (mEq/L)   Glucose, Bld 92  70 - 99 (mg/dL)   BUN 21  6 - 23 (mg/dL)   Creatinine, Ser 13.97 (*) 0.50 - 1.10 (mg/dL)   Calcium 6.0 (*) 8.4 - 10.5 (mg/dL)   GFR calc non Af Amer 3 (*) >90 (mL/min)   GFR calc Af Amer 4 (*) >90 (mL/min)  CBC     Status: Abnormal   Collection Time   08/19/11  5:05 AM      Component Value Range   WBC 5.3  4.0 - 10.5 (K/uL)   RBC 3.62 (*) 3.87 - 5.11 (MIL/uL)   Hemoglobin 9.9 (*) 12.0 - 15.0 (g/dL)   HCT 32.0 (*) 36.0 - 46.0 (%)   MCV 88.4  78.0 - 100.0 (fL)   MCH 27.3  26.0 - 34.0 (pg)    MCHC 30.9  30.0 - 36.0 (g/dL)   RDW 16.2 (*) 11.5 - 15.5 (%)   Platelets 240  150 - 400 (K/uL)  DIFFERENTIAL     Status: Normal   Collection Time   08/19/11  5:05 AM      Component Value Range   Neutrophils Relative 47  43 - 77 (%)   Neutro Abs 2.5  1.7 - 7.7 (K/uL)   Lymphocytes Relative 44  12 - 46 (%)   Lymphs Abs 2.3  0.7 - 4.0 (K/uL)   Monocytes Relative 5  3 - 12 (%)   Monocytes Absolute 0.3  0.1 - 1.0 (K/uL)   Eosinophils Relative 3  0 - 5 (%)   Eosinophils Absolute 0.2  0.0 - 0.7 (K/uL)   Basophils Relative 1  0 - 1 (%)   Basophils Absolute 0.1  0.0 - 0.1 (K/uL)  COMPREHENSIVE METABOLIC PANEL     Status: Abnormal   Collection Time   08/19/11  5:05 AM      Component Value Range   Sodium 138  135 - 145 (mEq/L)   Potassium 5.6 (*) 3.5 - 5.1 (mEq/L)   Chloride 96  96 - 112 (mEq/L)   CO2 35 (*) 19 - 32 (mEq/L)   Glucose, Bld 92  70 - 99 (mg/dL)   BUN 21  6 - 23 (mg/dL)   Creatinine, Ser 14.21 (*) 0.50 - 1.10 (mg/dL)   Calcium 6.2 (*) 8.4 - 10.5 (mg/dL)   Total Protein 4.8 (*) 6.0 - 8.3 (g/dL)   Albumin 1.9 (*) 3.5 - 5.2 (g/dL)   AST 17  0 - 37 (U/L)   ALT 5  0 - 35 (U/L)   Alkaline Phosphatase 211 (*) 39 - 117 (U/L)   Total Bilirubin 0.1 (*) 0.3 - 1.2 (mg/dL)   GFR calc non Af Amer 3 (*) >90 (mL/min)   GFR calc Af Amer 3 (*) >90 (mL/min)  MAGNESIUM     Status: Normal   Collection Time   08/19/11  5:05 AM      Component Value Range   Magnesium 1.9  1.5 - 2.5 (mg/dL)  CBC     Status: Abnormal   Collection Time   08/19/11  7:04 AM      Component Value Range   WBC 5.4  4.0 - 10.5 (K/uL)   RBC 3.51 (*) 3.87 - 5.11 (MIL/uL)   Hemoglobin 9.7 (*) 12.0 - 15.0 (g/dL)   HCT 31.0 (*) 36.0 - 46.0 (%)   MCV 88.3  78.0 - 100.0 (fL)   MCH 27.6  26.0 - 34.0 (pg)   MCHC 31.3  30.0 - 36.0 (g/dL)   RDW 16.4 (*) 11.5 - 15.5 (%)   Platelets 273  150 - 400 (K/uL)  COMPREHENSIVE METABOLIC PANEL     Status: Abnormal   Collection Time   08/19/11  7:04 AM      Component Value  Range   Sodium 138  135 - 145 (mEq/L)   Potassium 5.3 (*) 3.5 - 5.1 (mEq/L)   Chloride 98  96 - 112 (mEq/L)   CO2 35 (*) 19 - 32 (mEq/L)   Glucose, Bld 88  70 - 99 (mg/dL)   BUN 22  6 - 23 (mg/dL)   Creatinine, Ser 14.51 (*) 0.50 - 1.10 (mg/dL)   Calcium 6.0 (*) 8.4 - 10.5 (mg/dL)   Total Protein 4.8 (*) 6.0 - 8.3 (g/dL)   Albumin 1.8 (*) 3.5 - 5.2 (g/dL)   AST 14  0 - 37 (U/L)   ALT <5  0 - 35 (U/L)   Alkaline Phosphatase 210 (*) 39 - 117 (U/L)   Total Bilirubin 0.1 (*) 0.3 - 1.2 (mg/dL)   GFR calc non Af Amer 3 (*) >90 (mL/min)   GFR calc Af Amer 3 (*) >90 (mL/min)  RENAL FUNCTION PANEL     Status: Abnormal   Collection Time   08/19/11  7:04 AM      Component Value Range   Sodium 137  135 - 145 (mEq/L)   Potassium 5.4 (*) 3.5 - 5.1 (mEq/L)   Chloride 97  96 - 112 (mEq/L)   CO2 35 (*) 19 - 32 (mEq/L)   Glucose, Bld 90  70 - 99 (mg/dL)   BUN 22  6 - 23 (mg/dL)   Creatinine, Ser 14.61 (*) 0.50 - 1.10 (mg/dL)   Calcium 6.1 (*)  8.4 - 10.5 (mg/dL)   Phosphorus 3.2  2.3 - 4.6 (mg/dL)   Albumin 1.8 (*) 3.5 - 5.2 (g/dL)   GFR calc non Af Amer 3 (*) >90 (mL/min)   GFR calc Af Amer 3 (*) >90 (mL/min)  PHOSPHORUS     Status: Normal   Collection Time   08/19/11  7:04 AM      Component Value Range   Phosphorus 3.3  2.3 - 4.6 (mg/dL)    Micro Results: No results found for this or any previous visit (from the past 240 hour(s)). Studies/Results: No results found. Medications: reviewed Scheduled Meds:   . calcium carbonate (dosed in mg elemental calcium)  1,000 mg of elemental calcium Oral QID  . calcium carbonate (dosed in mg elemental calcium)  1,000 mg of elemental calcium Oral Once  . calcium gluconate 10g/L infusion  4 g Intravenous Once  . calcium gluconate 10g/L infusion  1 mg elemental calcium/kg/hr Intravenous To Major  . darbepoetin  150 mcg Subcutaneous Q7 days  . doxercalciferol  2.5 mcg Oral BID  . ferric glucontate (NULECIT) IV  125 mg Intravenous Q M,W,F-HD  .  heparin  5,000 Units Subcutaneous Q8H  . multivitamin  1 tablet Oral Daily  . oxyCODONE  5 mg Oral Once  . pantoprazole  40 mg Oral BID  . DISCONTD: calcium gluconate  4 g Intravenous Once  . DISCONTD: calcium gluconate  4 g Intravenous Once  . DISCONTD: calcium gluconate  4 g Intravenous Once  . DISCONTD: cinacalcet  60 mg Oral BID  . DISCONTD: cinacalcet  60 mg Oral Once  . DISCONTD: doxercalciferol  1 mcg Oral TID  . DISCONTD: doxercalciferol  1 mcg Oral Once  . DISCONTD: multivitamin  1 tablet Oral Once  . DISCONTD: ondansetron (ZOFRAN) IV  4 mg Intravenous Once  . DISCONTD: sevelamer  2,400 mg Oral To Major  . DISCONTD: sevelamer  2,400 mg Oral TID WC  . 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 Further treatment as per Renal.  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   Hypotension: improved.  GERD (gastroesophageal reflux disease) On protonix Nausea and Vomiting: improved. Continue to monitor.    LOS: 0 days   Davita Sublett 08/19/2011, 4:26 PM

## 2011-08-19 NOTE — ED Provider Notes (Signed)
Date: 08/19/2011  Rate:61  Rhythm: normal sinus rhythm  QRS Axis: normal  Intervals: QT prolonged  ST/T Wave abnormalities: normal  Conduction Disutrbances:none  Narrative Interpretation: Abnormal EKG with prolonged QT interval.  Old EKG Reviewed: none available     Raven Walker III, MD 08/19/11 [phone]

## 2011-08-19 NOTE — ED Notes (Addendum)
periotenial dialysis pt ; parathyroid gland removed due to hypercalcemia and put in left forearm; Pt now has hypocalcemia. Calcium drip going at 40 ml/ hr. Latest calcium level 6.0 at 2130.

## 2011-08-19 NOTE — ED Notes (Signed)
Got report from [place], [place]; introduced myself; pt has no needs at this time

## 2011-08-19 NOTE — Consult Note (Cosign Needed Addendum)
[facility] Renal Consultation Note  Indication for Consultation:  Management of ESRD/peritoneal dialysis, hypocalemia, anemia.  HPI: Raven Walker is a 30 y.o.  B woman with ESRD, secondary to HTN and congenital single kidney, on CCPD for two years, who is s/p parathyroidectomy with autotransplantation to left arm per Dr. Armandina Gemma on 08/04/11.  She has intermittent nausea and vomiting which worsened over the last four days, preventing her from keeping medications down.  Yesterday she had weakness, generalized tingling and numbness, and blurred vision and went to [facility] in [place] before she was transferred to [facility] with severe hypocalcemia. She received in [place] 1 g Ca gluconate, 0.5 mcg calcitriol po and IV and was started on IV of 4 amps Ca gluconate in 1 L NS @ 40cc/hr  PMHx:   Past Medical History  Diagnosis Date  . Hypertension   . Poor appetite   . 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 GERD (gastroesophageal reflux disease) Dec. 2010    ulcerative esophagitis per EGD   . Secondary hyperparathyroidism     s/p parathyroidectomy with autotransplantation to left arm per Dr. Baker Pierini on 08/04/11  . Streptococcal peritonitis  Sept. 2012    Past Surgical History  Procedure Date  . Arm grafts     three - done at [facility] none functioning  . Parathyroidectomy 4 gland     with implant of parathyroid into left forearm.   Family Hx:  Family History  Problem Relation Age of Onset  . Diabetes type I Mother   . Hypertension Mother   .     Marland Kitchen       Social History:  reports that she has quit smoking. She does not have any smokeless tobacco history on file. She reports that she drinks alcohol. She reports that she does not use illicit drugs.Lives with boyfriend her son and his daughter  Allergies:  Allergies  Allergen Reactions  . Contrast Media (Iodinated Diagnostic Agents) Other (See  Comments)    unknown  . Nickel Itching and Rash    Only where touched    Medications: Prior to Admission medications   Medication Sig Start Date End Date Taking? Authorizing Provider  acetaminophen (TYLENOL) 325 MG tablet Take 650 mg by mouth every 6 (six) hours as needed. For pain    Yes Historical Provider, MD  amLODipine (NORVASC) 10 MG tablet Take 10 mg by mouth at bedtime.    Yes Historical Provider, MD  b complex-vitamin c-folic acid (NEPHRO-VITE) 0.8 MG TABS Take 0.8 mg by mouth at bedtime.     Yes Historical Provider, MD      Yes Historical Provider, MD  Doxercalciferol (HECTOROL PO) Take by mouth 5 i mcg tablet 1 time daily.     Yes Historical Provider, MD  epoetin alfa (EPOGEN,PROCRIT) 47425 UNIT/ML injection Inject 20,000 Units into the skin once a week.    Yes Historical Provider, MD  pantoprazole (PROTONIX) 40 MG tablet Take 40 mg by mouth 2 (two) times daily.    Yes Historical Provider, MD  potassium chloride 20 MEQ/15ML (10%) solution Take 60 mEq by mouth per day     Yes Historical Provider, MD  TUMS regular (200 mg of elemental Ca per tab)   4 TID and 1 at bedtime   Yes Historical Provider, MD  EPO 20,000   Once / week Sunday  I have reviewed the patient's current  medications  PD ORDERS  At home 3 nighttime exchanges (2.5 L each) begins ~ 8-9 PM 1 daytime dwell (2L) 1 Pause (5 PM)  Labs:  Results for orders placed during the hospital encounter of 08/19/11 (from the past 48 hour(s))  CALCIUM, IONIZED     Status: Abnormal   Collection Time   08/19/11  4:00 AM      Component Value Range Comment   Calcium, Ion 0.76 (*) 1.12 - 1.32 (mmol/L)   BASIC METABOLIC PANEL     Status: Abnormal   Collection Time   08/19/11  4:00 AM      Component Value Range Comment   Sodium 138  135 - 145 (mEq/L)    Potassium 5.6 (*) 3.5 - 5.1 (mEq/L)    Chloride 96  96 - 112 (mEq/L)    CO2 34 (*) 19 - 32 (mEq/L)    Glucose, Bld 92  70 - 99 (mg/dL)    BUN 21  6 - 23 (mg/dL)    Creatinine,  Ser 13.97 (*) 0.50 - 1.10 (mg/dL)    Calcium 6.0 (*) 8.4 - 10.5 (mg/dL)    GFR calc non Af Amer 3 (*) >90 (mL/min)    GFR calc Af Amer 4 (*) >90 (mL/min)   CBC     Status: Abnormal   Collection Time   08/19/11  5:05 AM      Component Value Range Comment   WBC 5.3  4.0 - 10.5 (K/uL)    RBC 3.62 (*) 3.87 - 5.11 (MIL/uL)    Hemoglobin 9.9 (*) 12.0 - 15.0 (g/dL)    HCT 32.0 (*) 36.0 - 46.0 (%)    MCV 88.4  78.0 - 100.0 (fL)    MCH 27.3  26.0 - 34.0 (pg)    MCHC 30.9  30.0 - 36.0 (g/dL)    RDW 16.2 (*) 11.5 - 15.5 (%)    Platelets 240  150 - 400 (K/uL)   DIFFERENTIAL     Status: Normal   Collection Time   08/19/11  5:05 AM      Component Value Range Comment   Neutrophils Relative 47  43 - 77 (%)    Neutro Abs 2.5  1.7 - 7.7 (K/uL)    Lymphocytes Relative 44  12 - 46 (%)    Lymphs Abs 2.3  0.7 - 4.0 (K/uL)    Monocytes Relative 5  3 - 12 (%)    Monocytes Absolute 0.3  0.1 - 1.0 (K/uL)    Eosinophils Relative 3  0 - 5 (%)    Eosinophils Absolute 0.2  0.0 - 0.7 (K/uL)    Basophils Relative 1  0 - 1 (%)    Basophils Absolute 0.1  0.0 - 0.1 (K/uL)   COMPREHENSIVE METABOLIC PANEL     Status: Abnormal   Collection Time   08/19/11  5:05 AM      Component Value Range Comment   Sodium 138  135 - 145 (mEq/L)    Potassium 5.6 (*) 3.5 - 5.1 (mEq/L)    Chloride 96  96 - 112 (mEq/L)    CO2 35 (*) 19 - 32 (mEq/L)    Glucose, Bld 92  70 - 99 (mg/dL)    BUN 21  6 - 23 (mg/dL)    Creatinine, Ser 14.21 (*) 0.50 - 1.10 (mg/dL)    Calcium 6.2 (*) 8.4 - 10.5 (mg/dL)    Total Protein 4.8 (*) 6.0 - 8.3 (g/dL)    Albumin 1.9 (*) 3.5 -  5.2 (g/dL)    AST 17  0 - 37 (U/L)    ALT 5  0 - 35 (U/L)    Alkaline Phosphatase 211 (*) 39 - 117 (U/L)    Total Bilirubin 0.1 (*) 0.3 - 1.2 (mg/dL)    GFR calc non Af Amer 3 (*) >90 (mL/min)    GFR calc Af Amer 3 (*) >90 (mL/min)   MAGNESIUM     Status: Normal   Collection Time   08/19/11  5:05 AM      Component Value Range Comment   Magnesium 1.9  1.5 - 2.5  (mg/dL)   CBC     Status: Abnormal   Collection Time   08/19/11  7:04 AM      Component Value Range Comment   WBC 5.4  4.0 - 10.5 (K/uL)    RBC 3.51 (*) 3.87 - 5.11 (MIL/uL)    Hemoglobin 9.7 (*) 12.0 - 15.0 (g/dL)    HCT 31.0 (*) 36.0 - 46.0 (%)    MCV 88.3  78.0 - 100.0 (fL)    MCH 27.6  26.0 - 34.0 (pg)    MCHC 31.3  30.0 - 36.0 (g/dL)    RDW 16.4 (*) 11.5 - 15.5 (%)    Platelets 273  150 - 400 (K/uL)   COMPREHENSIVE METABOLIC PANEL     Status: Abnormal   Collection Time   08/19/11  7:04 AM      Component Value Range Comment   Sodium 138  135 - 145 (mEq/L)    Potassium 5.3 (*) 3.5 - 5.1 (mEq/L)    Chloride 98  96 - 112 (mEq/L)    CO2 35 (*) 19 - 32 (mEq/L)    Glucose, Bld 88  70 - 99 (mg/dL)    BUN 22  6 - 23 (mg/dL)    Creatinine, Ser 14.51 (*) 0.50 - 1.10 (mg/dL)    Calcium 6.0 (*) 8.4 - 10.5 (mg/dL)    Total Protein 4.8 (*) 6.0 - 8.3 (g/dL)    Albumin 1.8 (*) 3.5 - 5.2 (g/dL)    AST 14  0 - 37 (U/L)    ALT <5  0 - 35 (U/L) REPEATED TO VERIFY   Alkaline Phosphatase 210 (*) 39 - 117 (U/L)    Total Bilirubin 0.1 (*) 0.3 - 1.2 (mg/dL)    GFR calc non Af Amer 3 (*) >90 (mL/min)    GFR calc Af Amer 3 (*) >90 (mL/min)   RENAL FUNCTION PANEL     Status: Abnormal   Collection Time   08/19/11  7:04 AM      Component Value Range Comment   Sodium 137  135 - 145 (mEq/L)    Potassium 5.4 (*) 3.5 - 5.1 (mEq/L)    Chloride 97  96 - 112 (mEq/L)    CO2 35 (*) 19 - 32 (mEq/L)    Glucose, Bld 90  70 - 99 (mg/dL)    BUN 22  6 - 23 (mg/dL)    Creatinine, Ser 14.61 (*) 0.50 - 1.10 (mg/dL)    Calcium 6.1 (*) 8.4 - 10.5 (mg/dL)    Phosphorus 3.2  2.3 - 4.6 (mg/dL)    Albumin 1.8 (*) 3.5 - 5.2 (g/dL)    GFR calc non Af Amer 3 (*) >90 (mL/min)    GFR calc Af Amer 3 (*) >90 (mL/min)   PHOSPHORUS     Status: Normal   Collection Time   08/19/11  7:04 AM      Component  Value Range Comment   Phosphorus 3.3  2.3 - 4.6 (mg/dL)    PTH was 3876 pre-op and 14.4 post-op on 5 Nov Fe/TIBC 16%  sat on 5 Nov (ferritin 740 in October)  ROS:  Constitutional: negative Eyes: negative Ears, nose, mouth, throat, and face: positive for dry mouth Respiratory: negative Cardiovascular: negative Gastrointestinal: positive for nausea Hematologic/lymphatic: negative Musculoskeletal:negative Neurological: negative Behavioral/Psych: negative  Physical Exam: Filed Vitals:   08/19/11 1206  BP: 121/90  Pulse: 63  Temp: 98.2 F (36.8 C)  Resp: 16     General appearance: alert, cooperative and no distress Head: Normocephalic, without obvious abnormality, atraumatic Throat: abnormal findings: Dry mucosa Neck: no adenopathy, no carotid bruit, no JVD, supple, symmetrical, trachea midline and thyroid not enlarged, symmetric, no tenderness/mass/nodules Resp: clear to auscultation bilaterally Chest wall: no tenderness Cardio: regular rate and rhythm, S1, S2 normal, no murmur, click, rub or gallop GI: soft, non-tender; bowel sounds normal; no masses,  no organomegaly Extremities: extremities normal, atraumatic, no cyanosis or edema Dialysis Access: PD catheter exit site clean  Assessment/Plan: 1. Hypocalcemia - secondary to parathyroidectomy on 08/04/11, on Calcium Carbonate (increased to 2 gm tid between meals and 500 mg qhs on 11/12) and Hectorol ( increased to 41mcg qd on 11/12) as outpatient, currently on Ca drip. 2. ESRD -  Continue with cycler.  Hold KCl for now 3 Hypertension/volume  - BP OK now 4. Anemia  -continue with EPO and give IV Fe 5. Metabolic bone disease - see (1) above .  Corrected Ca is 7.7 (though ionized Ca is low).  Will continue with hectorol (which she has at home)rather than calcitriol if Ca will increase 6. Nutrition albumin LOW. 7.  N & V--per primary svc  Khiyan Crace 08/19/2011, 1:22 PM   Attending Nephrologist:I Have seen and examined Raven Walker.  I also reviewed records and called [facility] for outpt records.  Will give IV Calcium, po calcium, and  po hectorol. Corrected calcium is 7.7 with low albumin. Continue PD.  Add IV Fe  Salem Senate, MD

## 2011-08-19 NOTE — ED Provider Notes (Signed)
History     CSN: [id number] Arrival date & time: 08/19/2011  3:36 AM   First MD Initiated Contact with Patient 08/19/11 0421      Chief Complaint  Patient presents with  . Weakness    (Consider location/radiation/quality/duration/timing/severity/associated sxs/prior treatment) HPI  Patient is transferred from [facility]. The patient was not discussed directly from me however I was told the patient was discussed with Dr. Hassell Done who requested the patient be transferred and be admitted to the hospital service for hypocalcemia. 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 She's been on peritoneal dialysis for about 2 years. She had a parathyroidectomy done on October 30 by Dr. Harlow Asa and had a autotransplantation to her arm. She relates she's had nausea and vomiting for the past 4 days but got very worse yesterday. She denies diarrhea she states she's had muscle spasms in her legs have been numb in her fingers have been tingling in that started yesterday. She's uncertain of having fever. She states last night when she tried to stand up she felt like she was going to pass out and she was losing her vision so she went to [facility] There she was fine to be hypocalcemic with a calcium level of 6.0. Her BUN was 22 with a creatinine of 14. She had anion gap of 20. She was started on a calcium drip and is getting 18 mEq per hour of calcium.  Primary care Dr. Delphina Cahill Nephrologist Dr. Detterding  Past Medical History  Diagnosis Date  . Hypertension   . Poor appetite   . Chronic kidney disease   . GERD (gastroesophageal reflux disease)   . Secondary hyperparathyroidism    end-stage renal disease on peritoneal dialysis  Past Surgical History  Procedure Date  . Arm graphs     three - done at [facility]  . Parathyroidectomy     with graft of parathyroid into left upper arm.    Family History  Problem Relation Age of Onset  . Diabetes type I  Mother   . Hypertension Mother   . Diabetes type I Father   . Hypertension Father     History  Substance Use Topics  . Smoking status: Former Research scientist (life sciences)  . Smokeless tobacco: Not on file  . Alcohol Use: Yes     Once per month   lives at home with her boyfriend  OB History    Grav Para Term Preterm Abortions TAB SAB Ect Mult Living                  Review of Systems  All other systems reviewed and are negative.    Allergies  Contrast media and Nickel  Home Medications   Current Outpatient Rx  Name Route Sig Dispense Refill  . ACETAMINOPHEN 325 MG PO TABS Oral Take 650 mg by mouth every 6 (six) hours as needed. For pain     . AMLODIPINE BESYLATE 10 MG PO TABS Oral Take 10 mg by mouth at bedtime.     Marland Kitchen NEPHRO-VITE 0.8 MG PO TABS Oral Take 0.8 mg by mouth at bedtime.      Marland Kitchen CINACALCET HCL 60 MG PO TABS Oral Take 60 mg by mouth 2 (two) times daily.     Marland Kitchen HECTOROL PO Oral Take by mouth 3 (three) times daily.      . EPOETIN ALFA 29562 UNIT/ML IJ SOLN Subcutaneous Inject 20,000 Units into the skin once a week.     Marland Kitchen  PANTOPRAZOLE SODIUM 40 MG PO TBEC Oral Take 40 mg by mouth 2 (two) times daily.     Marland Kitchen POTASSIUM CHLORIDE 20 MEQ/15ML (10%) PO LIQD Oral Take 80 mEq by mouth daily.      Marland Kitchen SEVELAMER CARBONATE 2.4 G POWDER PKT Oral Take 2.4 g by mouth 3 (three) times daily with meals.        BP 117/82  Pulse 66  Temp(Src) 98.1 F (36.7 C) (Oral)  Resp 16  Ht $R'5\' 5"'jY$  (1.651 m)  Wt 125 lb 10.6 oz (57 kg)  BMI 20.91 kg/m2  SpO2 100% Vital signs normal  Physical Exam  Vitals reviewed. Constitutional: She is oriented to person, place, and time. She appears well-developed and well-nourished. She is cooperative.  HENT:  Head: Normocephalic and atraumatic.  Right Ear: External ear normal.  Left Ear: External ear normal.  Nose: Nose normal. No mucosal edema or rhinorrhea.  Mouth/Throat: Mucous membranes are dry. No dental abscesses or uvula swelling. No oropharyngeal exudate,  posterior oropharyngeal edema, posterior oropharyngeal erythema or tonsillar abscesses.  Eyes: Conjunctivae and EOM are normal. Pupils are equal, round, and reactive to light.  Neck: Normal range of motion and full passive range of motion without pain. Neck supple.  Cardiovascular: Normal rate, regular rhythm and normal heart sounds.  Exam reveals no gallop and no friction rub.   No murmur heard. Pulmonary/Chest: Effort normal and breath sounds normal. Not tachypneic. No respiratory distress. She has no wheezes. She has no rhonchi. She has no rales. She exhibits no tenderness and no crepitus.  Abdominal: Soft. Normal appearance and bowel sounds are normal. She exhibits no distension. There is no tenderness. There is no rebound and no guarding.  Musculoskeletal: She exhibits no edema and no tenderness.       Moves all extremities well.   Neurological: She is alert and oriented to person, place, and time. She has normal strength and normal reflexes. No cranial nerve deficit.       No chvostek's sign  Skin: Skin is warm, dry and intact. No rash noted. No erythema. No pallor.  Psychiatric: Her speech is normal and behavior is normal. Thought content normal. Her mood appears not anxious.       Affect is flat    ED Course  Procedures (including critical care time)  Patient maintained on her calcium drip. 0602 a.m. Dr. Benny Lennert states she will admit  Results for orders placed during the hospital encounter of 54/62/70  BASIC METABOLIC PANEL      Component Value Range   Sodium 138  135 - 145 (mEq/L)   Potassium 5.6 (*) 3.5 - 5.1 (mEq/L)   Chloride 96  96 - 112 (mEq/L)   CO2 34 (*) 19 - 32 (mEq/L)   Glucose, Bld 92  70 - 99 (mg/dL)   BUN 21  6 - 23 (mg/dL)   Creatinine, Ser 13.97 (*) 0.50 - 1.10 (mg/dL)   Calcium 6.0 (*) 8.4 - 10.5 (mg/dL)   GFR calc non Af Amer 3 (*) >90 (mL/min)   GFR calc Af Amer 4 (*) >90 (mL/min)  CBC      Component Value Range   WBC 5.3  4.0 - 10.5 (K/uL)   RBC 3.62  (*) 3.87 - 5.11 (MIL/uL)   Hemoglobin 9.9 (*) 12.0 - 15.0 (g/dL)   HCT 32.0 (*) 36.0 - 46.0 (%)   MCV 88.4  78.0 - 100.0 (fL)   MCH 27.3  26.0 - 34.0 (pg)   MCHC 30.9  30.0 - 36.0 (g/dL)   RDW 16.2 (*) 11.5 - 15.5 (%)   Platelets 240  150 - 400 (K/uL)  DIFFERENTIAL      Component Value Range   Neutrophils Relative 47  43 - 77 (%)   Neutro Abs 2.5  1.7 - 7.7 (K/uL)   Lymphocytes Relative 44  12 - 46 (%)   Lymphs Abs 2.3  0.7 - 4.0 (K/uL)   Monocytes Relative 5  3 - 12 (%)   Monocytes Absolute 0.3  0.1 - 1.0 (K/uL)   Eosinophils Relative 3  0 - 5 (%)   Eosinophils Absolute 0.2  0.0 - 0.7 (K/uL)   Basophils Relative 1  0 - 1 (%)   Basophils Absolute 0.1  0.0 - 0.1 (K/uL)  COMPREHENSIVE METABOLIC PANEL      Component Value Range   Sodium 138  135 - 145 (mEq/L)   Potassium 5.6 (*) 3.5 - 5.1 (mEq/L)   Chloride 96  96 - 112 (mEq/L)   CO2 35 (*) 19 - 32 (mEq/L)   Glucose, Bld 92  70 - 99 (mg/dL)   BUN 21  6 - 23 (mg/dL)   Creatinine, Ser 14.21 (*) 0.50 - 1.10 (mg/dL)   Calcium 6.2 (*) 8.4 - 10.5 (mg/dL)   Total Protein 4.8 (*) 6.0 - 8.3 (g/dL)   Albumin 1.9 (*) 3.5 - 5.2 (g/dL)   AST 17  0 - 37 (U/L)   ALT 5  0 - 35 (U/L)   Alkaline Phosphatase 211 (*) 39 - 117 (U/L)   Total Bilirubin 0.1 (*) 0.3 - 1.2 (mg/dL)   GFR calc non Af Amer 3 (*) >90 (mL/min)   GFR calc Af Amer 3 (*) >90 (mL/min)  MAGNESIUM      Component Value Range   Magnesium 1.9  1.5 - 2.5 (mg/dL)    Laboratory interpretation hyperkalemia and renal failure, hypercalciumia,anemia   Date: 08/19/2011  Rate: 64  Rhythm: normal sinus rhythm  QRS Axis: normal  Intervals: QT prolonged  ST/T Wave abnormalities: normal  Conduction Disutrbances:none  Narrative Interpretation:   Old EKG Reviewed: changes noted from 06/16/2011 QTI now prolonged  Diagnoses that have been ruled out:  Diagnoses that are still under consideration:  Final diagnoses:  Hypocalcemia  Hyperkalemia  Renal failure, chronic  Nausea and  vomiting  Dehydration   Plan admit  Rolland Porter, MD, [place], MD 08/19/11 [phone]

## 2011-08-19 NOTE — H&P (Signed)
Raven Walker is an 30 y.o. female.   Chief Complaint: Hypotension, malaise, nausea and vomiting HPI: Pt is a 30 yr old female who was born with a single kidney and then suffered renal failure due to hypertension.  The patient has been on peritoneal dialysis for two years during which she had a great deal of trouble with secondary hyperparathyroidism.  On 10/31 Dr. Harlow Asa performed a parathyroidectomy on the patient with return of part of her parathyroid as a graft in her left upper arm.   The patient started having nausea, vomiting and malaise about 4 days ago.  Her blood pressure which is normally high, has been low.  She has been very symptomatic from the low blood pressure.  Furthermore she complains of tingling sensations specifically around her eyes and in her legs.  She states that she never knew she could feel so bad. She went to [facility] and she was found to have a calcium of 5.  Despite having a continue calcium drip since that time her calcium is still only 6.  Dr. Hassell Done with [facility] was contacted and ask that the patient be transferred here.  Past Medical History  Diagnosis Date  . Hypertension   . Poor appetite   . Chronic kidney disease   . GERD (gastroesophageal reflux disease)   . Secondary hyperparathyroidism     Past Surgical History  Procedure Date  . Arm graphs     three - done at [facility]  . Parathyroidectomy     with graft of parathyroid into left upper arm.    Family History  Problem Relation Age of Onset  . Diabetes type I Mother   . Hypertension Mother   . Diabetes type I Father   . Hypertension Father    Social History:  reports that she has quit smoking. She does not have any smokeless tobacco history on file. She reports that she drinks alcohol. She reports that she does not use illicit drugs. Medications Prior to Admission  Medication Dose Route Frequency Provider Last Rate Last Dose  . acetaminophen (TYLENOL) suppository 650 mg  650  mg Rectal Q6H PRN Deshanae Lindo      . acetaminophen (TYLENOL) tablet 650 mg  650 mg Oral Q6H PRN Aurorah Schlachter      . calcium carbonate (dosed in mg elemental calcium) suspension 500 mg of elemental calcium  500 mg of elemental calcium Oral Q6H PRN Marisah Laker      . calcium gluconate 930 mg of elemental calcium in sodium chloride 0.9 % 1,000 mL infusion  1 mg elemental calcium/kg/hr Intravenous To Major Janice Norrie, MD 61.3 mL/hr at 08/19/11 0627 1 mg elemental calcium/kg/hr at 08/19/11 0627  . camphor-menthol (SARNA) lotion 1 application  1 application Topical Z6X PRN Alanni Vader       And  . hydrOXYzine (ATARAX/VISTARIL) tablet 25 mg  25 mg Oral Q8H PRN Yumalay Circle      . cinacalcet (SENSIPAR) tablet 60 mg  60 mg Oral BID Aedon Deason      . darbepoetin (ARANESP) injection 150 mcg  150 mcg Subcutaneous Q7 days Konnor Vondrasek      . docusate sodium (ENEMEEZ) enema 283 mg  1 enema Rectal PRN Betzaira Mentel      . doxercalciferol (HECTOROL) capsule 1 mcg  1 mcg Oral TID Elainna Eshleman      . feeding supplement (NEPRO CARB STEADY) liquid 237 mL  237 mL Oral TID PRN Ausencio Vaden      .  heparin injection 5,000 Units  5,000 Units Subcutaneous Q8H Yuritzi Kamp      . multivitamin (RENA-VIT) tablet 1 tablet  1 tablet Oral Daily Abshir Paolini      . pantoprazole (PROTONIX) EC tablet 40 mg  40 mg Oral BID Cyan Clippinger      . promethazine (PHENERGAN) tablet 12.5 mg  12.5 mg Oral Q6H PRN Linzie Criss       Or  . promethazine (PHENERGAN) suppository 25 mg  25 mg Rectal BID PRN Jessamine Barcia      . Sevelamer Carbonate PACK 2.4 g  2.4 g Oral TID WC Jayce Kainz      . sorbitol 70 % solution 30 mL  30 mL Oral PRN Meliton Samad      . zolpidem (AMBIEN) tablet 5 mg  5 mg Oral QHS PRN Lorine Iannaccone       Medications Prior to Admission  Medication Sig Dispense Refill  . acetaminophen (TYLENOL) 325 MG tablet Take 650 mg by mouth every 6 (six) hours as needed. For pain       . amLODipine (NORVASC) 10 MG tablet Take 10 mg by mouth at bedtime.       Marland Kitchen b  complex-vitamin c-folic acid (NEPHRO-VITE) 0.8 MG TABS Take 0.8 mg by mouth at bedtime.        . cinacalcet (SENSIPAR) 60 MG tablet Take 60 mg by mouth 2 (two) times daily.       Marland Kitchen Doxercalciferol (HECTOROL PO) Take by mouth 3 (three) times daily.        Marland Kitchen epoetin alfa (EPOGEN,PROCRIT) 34563 UNIT/ML injection Inject 20,000 Units into the skin once a week.       . pantoprazole (PROTONIX) 40 MG tablet Take 40 mg by mouth 2 (two) times daily.       . potassium chloride 20 MEQ/15ML (10%) solution Take 80 mEq by mouth daily.        . Sevelamer Carbonate 2.4 G PACK Take 2.4 g by mouth 3 (three) times daily with meals.          Allergies:  Allergies  Allergen Reactions  . Contrast Media (Iodinated Diagnostic Agents) Other (See Comments)    unknown  . Nickel Itching and Rash    Only where touched    Constitutional: positive for fatigue and malaise, negative for chills, fevers and weight loss Eyes: negative for icterus, irritation, redness and positive for blurred vision Ears, nose, mouth, throat, and face: negative for earaches, hearing loss, nasal congestion and sore throat Respiratory: negative for cough, dyspnea on exertion, sputum and wheezing Cardiovascular: negative for chest pain, chest pressure/discomfort, dyspnea, palpitations and positive for near syncope Gastrointestinal: negative for constipation, nausea and vomiting Genitourinary:patient does not make urine Integument/breast: positive for tingling around eyes and on skin, negative for dryness, rash and skin color change Hematologic/lymphatic: negative for bleeding, easy bruising, lymphadenopathy and petechiae Musculoskeletal:negative for arthralgias, muscle weakness and myalgias Neurological: positive for dizziness, paresthesia and weakness, negative for coordination problems, seizures and vertigo Behavioral/Psych: negative for anxiety, depression and irritability   General appearance: alert, cooperative, appears stated age and  fatigued Head: Normocephalic, without obvious abnormality, atraumatic Eyes: conjunctivae/corneas clear. PERRL, EOM's intact. Fundi benign. Throat: lips, mucosa, and tongue normal; teeth and gums normal Neck: no adenopathy, no carotid bruit, no JVD, supple, symmetrical, trachea midline and thyroid not enlarged, symmetric, no tenderness/mass/nodules Resp: clear to auscultation bilaterally and normal percussion bilaterally Chest wall: no tenderness Cardio: regular rate and rhythm, S1, S2 normal, no  murmur, click, rub or gallop and normal apical impulse GI: soft, non-tender; bowel sounds normal; no masses,  no organomegaly Extremities: extremities normal, atraumatic, no cyanosis or edema and Homans sign is negative, no sign of DVT Pulses: 2+ and symmetric Skin: Skin color, texture, turgor normal. No rashes or lesions Lymph nodes: Cervical, supraclavicular, and axillary nodes normal. Neurologic: Alert and oriented X 3, normal strength and tone. Normal symmetric reflexes. Normal coordination and gait   Results for orders placed during the hospital encounter of 08/19/11 (from the past 48 hour(s))  BASIC METABOLIC PANEL     Status: Abnormal   Collection Time   08/19/11  4:00 AM      Component Value Range Comment   Sodium 138  135 - 145 (mEq/L)    Potassium 5.6 (*) 3.5 - 5.1 (mEq/L)    Chloride 96  96 - 112 (mEq/L)    CO2 34 (*) 19 - 32 (mEq/L)    Glucose, Bld 92  70 - 99 (mg/dL)    BUN 21  6 - 23 (mg/dL)    Creatinine, Ser 13.97 (*) 0.50 - 1.10 (mg/dL)    Calcium 6.0 (*) 8.4 - 10.5 (mg/dL)    GFR calc non Af Amer 3 (*) >90 (mL/min)    GFR calc Af Amer 4 (*) >90 (mL/min)   CBC     Status: Abnormal   Collection Time   08/19/11  5:05 AM      Component Value Range Comment   WBC 5.3  4.0 - 10.5 (K/uL)    RBC 3.62 (*) 3.87 - 5.11 (MIL/uL)    Hemoglobin 9.9 (*) 12.0 - 15.0 (g/dL)    HCT 32.0 (*) 36.0 - 46.0 (%)    MCV 88.4  78.0 - 100.0 (fL)    MCH 27.3  26.0 - 34.0 (pg)    MCHC 30.9  30.0  - 36.0 (g/dL)    RDW 16.2 (*) 11.5 - 15.5 (%)    Platelets 240  150 - 400 (K/uL)   DIFFERENTIAL     Status: Normal   Collection Time   08/19/11  5:05 AM      Component Value Range Comment   Neutrophils Relative 47  43 - 77 (%)    Neutro Abs 2.5  1.7 - 7.7 (K/uL)    Lymphocytes Relative 44  12 - 46 (%)    Lymphs Abs 2.3  0.7 - 4.0 (K/uL)    Monocytes Relative 5  3 - 12 (%)    Monocytes Absolute 0.3  0.1 - 1.0 (K/uL)    Eosinophils Relative 3  0 - 5 (%)    Eosinophils Absolute 0.2  0.0 - 0.7 (K/uL)    Basophils Relative 1  0 - 1 (%)    Basophils Absolute 0.1  0.0 - 0.1 (K/uL)   COMPREHENSIVE METABOLIC PANEL     Status: Abnormal   Collection Time   08/19/11  5:05 AM      Component Value Range Comment   Sodium 138  135 - 145 (mEq/L)    Potassium 5.6 (*) 3.5 - 5.1 (mEq/L)    Chloride 96  96 - 112 (mEq/L)    CO2 35 (*) 19 - 32 (mEq/L)    Glucose, Bld 92  70 - 99 (mg/dL)    BUN 21  6 - 23 (mg/dL)    Creatinine, Ser 14.21 (*) 0.50 - 1.10 (mg/dL)    Calcium 6.2 (*) 8.4 - 10.5 (mg/dL)    Total Protein 4.8 (*) 6.0 -  8.3 (g/dL)    Albumin 1.9 (*) 3.5 - 5.2 (g/dL)    AST 17  0 - 37 (U/L)    ALT 5  0 - 35 (U/L)    Alkaline Phosphatase 211 (*) 39 - 117 (U/L)    Total Bilirubin 0.1 (*) 0.3 - 1.2 (mg/dL)    GFR calc non Af Amer 3 (*) >90 (mL/min)    GFR calc Af Amer 3 (*) >90 (mL/min)    '@[id number]'$ @  Blood pressure 117/82, pulse 66, temperature 98.1 F (36.7 C), temperature source Oral, resp. rate 16, height $RemoveBe'5\' 5"'TyAQDoxrS$  (1.651 m), weight 57 kg (125 lb 10.6 oz), SpO2 100.00%.    Assessment/Plan 1. Hypocalcemia with paraesthesias, hypotension, and a prolonged QT.  Only minimally responsive to continuous calcium gtt (18.6 MEq/L).  Most recent Ca level was 6.  She presented at [facility] with calcium of 5.  2. Hypotension secondary to #1 and nausea and vomiting.  3. Hyperkalemia due to renal failure with creatinine of 14.21  4. Acute on chronic renal failure  5. Paresthesias secondary to  hypocalcemia  6. GERD pt does peritoneal dialysis.  Joyclyn Plazola 08/19/2011, 6:43 AM

## 2011-08-19 NOTE — ED Notes (Signed)
Critical value of Ca of 6.2

## 2011-08-19 NOTE — ED Notes (Signed)
Family at bedside. 

## 2011-08-20 LAB — RENAL FUNCTION PANEL
BUN: 25 mg/dL — ABNORMAL HIGH (ref 6–23)
Chloride: 98 mEq/L (ref 96–112)
Creatinine, Ser: 15.16 mg/dL — ABNORMAL HIGH (ref 0.50–1.10)
Glucose, Bld: 139 mg/dL — ABNORMAL HIGH (ref 70–99)
Phosphorus: 3.7 mg/dL (ref 2.3–4.6)
Potassium: 4.9 mEq/L (ref 3.5–5.1)

## 2011-08-20 LAB — PTH, INTACT AND CALCIUM
Calcium, Total (PTH): 5.9 mg/dL — CL (ref 8.4–10.5)
PTH: 41.3 pg/mL (ref 14.0–72.0)

## 2011-08-20 MED ORDER — CALCITRIOL 0.5 MCG PO CAPS
0.5000 ug | ORAL_CAPSULE | Freq: Two times a day (BID) | ORAL | Status: DC
  Administered 2011-08-20 – 2011-08-21 (×4): 0.5 ug via ORAL
  Filled 2011-08-20 (×6): qty 1

## 2011-08-20 MED ORDER — DELFLEX-LC/2.5% DEXTROSE 394 MOSM/L IP SOLN
Freq: Once | INTRAPERITONEAL | Status: AC
  Administered 2011-08-21: via INTRAPERITONEAL

## 2011-08-20 MED ORDER — SODIUM CHLORIDE 0.9 % IV SOLN
1.0000 g | Freq: Once | INTRAVENOUS | Status: DC
  Filled 2011-08-20: qty 10

## 2011-08-20 MED ORDER — DELFLEX-LC/2.5% DEXTROSE 394 MOSM/L IP SOLN
INTRAPERITONEAL | Status: DC
  Administered 2011-08-20: 5000 mL via INTRAPERITONEAL

## 2011-08-20 MED ORDER — DELFLEX-LC/2.5% DEXTROSE 394 MOSM/L IP SOLN
Freq: Once | INTRAPERITONEAL | Status: AC
  Administered 2011-08-21: 18:00:00 via INTRAPERITONEAL

## 2011-08-20 NOTE — Progress Notes (Signed)
Utilization Review Completed.Donne Anon T11/15/2012

## 2011-08-20 NOTE — Progress Notes (Signed)
CRITICAL VALUE ALERT  Critical value received:  Calcium = 5.9   Date of notification:  08/20/2011   Time of notification:  1430  Critical value read back:yes  Nurse who received alert:  M. Orene Desanctis, RN  MD notified (1st page):  Dr. Karleen Hampshire  Time of first page:  1432  MD notified (2nd page):  Time of second page:  Responding MD:  Dr. Karleen Hampshire  Time MD responded:  2:38 PM

## 2011-08-20 NOTE — Progress Notes (Signed)
Subjective: Interval History: none.  Objective: Vital signs in last 24 hours: Temp:  [98 F (36.7 C)-98.7 F (37.1 C)] 98.1 F (36.7 C) (11/15 0752) Pulse Rate:  [58-78] 65  (11/15 0800) Resp:  [11-22] 15  (11/15 0800) BP: (94-143)/(13-101) 107/78 mmHg (11/15 0800) SpO2:  [96 %-100 %] 98 % (11/15 0800) FiO2 (%):  [2 %] 2 % (11/14 1800) Weight:  [63 kg (138 lb 14.2 oz)-65 kg (143 lb 4.8 oz)] 143 lb 4.8 oz (65 kg) (11/15 0500) Weight change: 6 kg (13 lb 3.6 oz)  Intake/Output from previous day: 11/14 0701 - 11/15 0700 In: 560 [P.O.:240; I.V.:320] Out: 1 [Stool:1] Intake/Output this shift:   General appearance: alert, cooperative and no distress Head: Normocephalic, without obvious abnormality, atraumatic Throat: abnormal findings: Dry mucosa Neck: no adenopathy, no carotid bruit, no JVD, supple, symmetrical, trachea midline and thyroid not enlarged, symmetric, no tenderness/mass/nodules Resp: clear to auscultation bilaterally Chest wall: no tenderness Cardio: regular rate and rhythm, S1, S2 normal, no murmur, click, rub or gallop GI: soft, non-tender; bowel sounds normal; no masses,  no organomegaly Extremities: extremities normal, atraumatic, no cyanosis or edema Dialysis Access: PD catheter exit site clean   Lab Results:  [facility] 08/19/11 0704 08/19/11 0505  WBC 5.4 5.3  HGB 9.7* 9.9*  HCT 31.0* 32.0*  PLT 273 240   BMET:  Basename 08/20/11 0334 08/19/11 1605  NA 139 136  K 4.9 6.1*  CL 98 97  CO2 33* 33*  GLUCOSE 139* 96  BUN 25* 23  CREATININE 15.16* 14.92*  CALCIUM 6.9* 6.8*   No results found for this basename: PTH:2 in the last 72 hours Iron Studies: No results found for this basename: IRON,TIBC,TRANSFERRIN,FERRITIN in the last 72 hours  Studies/Results: No results found.  Scheduled:   . calcium carbonate (dosed in mg elemental calcium)  1,000 mg of elemental calcium Oral QID  . calcium carbonate (dosed in mg elemental calcium)  1,000 mg of  elemental calcium Oral Once  . calcium gluconate 10g/L infusion  4 g Intravenous Once  . calcium gluconate 10g/L infusion  1 mg elemental calcium/kg/hr Intravenous To Major  . darbepoetin  150 mcg Subcutaneous Q7 days  . doxercalciferol  2.5 mcg Oral BID  . heparin  5,000 Units Subcutaneous Q8H  . oxyCODONE  5 mg Oral Once  . pantoprazole  40 mg Oral BID  . DISCONTD: calcium gluconate  4 g Intravenous Once  . DISCONTD: calcium gluconate  4 g Intravenous Once  . DISCONTD: calcium gluconate  4 g Intravenous Once  . DISCONTD: cinacalcet  60 mg Oral BID  . DISCONTD: cinacalcet  60 mg Oral Once  . DISCONTD: doxercalciferol  1 mcg Oral TID  . DISCONTD: doxercalciferol  1 mcg Oral Once  . DISCONTD: ferric glucontate (NULECIT) IV  125 mg Intravenous Q M,W,F-HD  . DISCONTD: multivitamin  1 tablet Oral Daily  . DISCONTD: multivitamin  1 tablet Oral Once  . DISCONTD: ondansetron (ZOFRAN) IV  4 mg Intravenous Once  . DISCONTD: sevelamer  2,400 mg Oral TID WC   Continuous:   Assessment/Plan: Assessment/Plan: 1. Hypocalcemia - secondary to parathyroidectomy on 08/04/11, on Calcium Carbonate (increased to 2 gm tid between meals and 500 mg qhs on 11/12) and Hectorol ( increased to 30mcg qd on 11/12) as outpatient.  Off calcium infusion and calcium levels have improved.  Will d/c hectoral and place on calcitriol 0.97mcg bid to help with hypocalcemia and recheck in am.  Pt is currently asymptomatic 2.  ESRD -  Continue with cycler.  Hold KCl for now.  Consider switching to IHD due to profound protein malnutrition 3 Hypertension/volume  - BP OK now 4. 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  continue with EPO and give IV Fe 5. Metabolic bone disease - see (1) above .  Corrected Ca is 7.8.  Will switch to calcitriol and follow calcium 6. Severe protein malnutrition- continue with supplements and consider transition to IHD via catheter until albumin can improve 7.  N & V--per primary svc, ?  gastroparesis    LOS: 1 day   Jeralynn Vaquera A 08/20/2011,9:43 AM

## 2011-08-20 NOTE — Progress Notes (Signed)
08/20/2011 4:01 PM Raven Walker, Raven Walker   Pt to be transferred to unit 6700, report given to receiving nurse-Nancy.  Pt educated on transfer orders and pt verbalized understanding of teaching.  Pt stable to transport via wheel chair.

## 2011-08-20 NOTE — Progress Notes (Signed)
Subjective: Denies any nausea, vomiting, and abd pain. Leg crams and tingling and numbness has improved. Denies any palpitations. Objective: Vital signs in last 24 hours: Filed Vitals:   08/20/11 0500 08/20/11 0752 08/20/11 0800 08/20/11 1147  BP: 116/79  107/78   Pulse: 78  65   Temp: 98 F (36.7 C) 98.1 F (36.7 C)  99.1 F (37.3 C)  TempSrc: Oral Oral  Oral  Resp: 20  15   Height:      Weight: 65 kg (143 lb 4.8 oz)     SpO2: 98%  98%    Weight change: 6 kg (13 lb 3.6 oz)  Intake/Output Summary (Last 24 hours) at 08/20/11 1403 Last data filed at 08/19/11 2200  Gross per 24 hour  Intake    560 ml  Output      1 ml  Net    559 ml   Physical Exam:   General Appearance:    Alert, cooperative, no distress, appears stated age  Lungs:     Clear to auscultation bilaterally, respirations unlabored   Heart:    Regular rate and rhythm, S1 and S2 normal,   Abdomen:     Soft, non-tender, bowel sounds active all four quadrants,    Pd site clean  Extremities:   Extremities normal, atraumatic, no cyanosis or edema              Lab Results: Results for orders placed during the hospital encounter of 08/19/11 (from the past 24 hour(s))  RENAL FUNCTION PANEL     Status: Abnormal   Collection Time   08/19/11  4:05 PM      Component Value Range   Sodium 136  135 - 145 (mEq/L)   Potassium 6.1 (*) 3.5 - 5.1 (mEq/L)   Chloride 97  96 - 112 (mEq/L)   CO2 33 (*) 19 - 32 (mEq/L)   Glucose, Bld 96  70 - 99 (mg/dL)   BUN 23  6 - 23 (mg/dL)   Creatinine, Ser 38.72 (*) 0.50 - 1.10 (mg/dL)   Calcium 6.8 (*) 8.4 - 10.5 (mg/dL)   Phosphorus 3.8  2.3 - 4.6 (mg/dL)   Albumin 1.7 (*) 3.5 - 5.2 (g/dL)   GFR calc non Af Amer 3 (*) >90 (mL/min)   GFR calc Af Amer 3 (*) >90 (mL/min)  MRSA PCR SCREENING     Status: Normal   Collection Time   08/19/11  6:15 PM      Component Value Range   MRSA by PCR NEGATIVE  NEGATIVE   RENAL FUNCTION PANEL     Status: Abnormal   Collection Time   08/20/11  3:34 AM      Component Value Range   Sodium 139  135 - 145 (mEq/L)   Potassium 4.9  3.5 - 5.1 (mEq/L)   Chloride 98  96 - 112 (mEq/L)   CO2 33 (*) 19 - 32 (mEq/L)   Glucose, Bld 139 (*) 70 - 99 (mg/dL)   BUN 25 (*) 6 - 23 (mg/dL)   Creatinine, Ser 35.75 (*) 0.50 - 1.10 (mg/dL)   Calcium 6.9 (*) 8.4 - 10.5 (mg/dL)   Phosphorus 3.7  2.3 - 4.6 (mg/dL)   Albumin 1.7 (*) 3.5 - 5.2 (g/dL)   GFR calc non Af Amer 3 (*) >90 (mL/min)   GFR calc Af Amer 3 (*) >90 (mL/min)    Micro Results: Recent Results (from the past 240 hour(s))  MRSA PCR SCREENING     Status: Normal  Collection Time   08/19/11  6:15 PM      Component Value Range Status Comment   MRSA by PCR NEGATIVE  NEGATIVE  Final    Studies/Results: No results found. Medications: reviewed Scheduled Meds:   . calcitRIOL  0.5 mcg Oral BID  . calcium carbonate (dosed in mg elemental calcium)  1,000 mg of elemental calcium Oral QID  . calcium carbonate (dosed in mg elemental calcium)  1,000 mg of elemental calcium Oral Once  . calcium gluconate 10g/L infusion  1 mg elemental calcium/kg/hr Intravenous To Major  . darbepoetin  150 mcg Subcutaneous Q7 days  . heparin  5,000 Units Subcutaneous Q8H  . pantoprazole  40 mg Oral BID  . DISCONTD: doxercalciferol  2.5 mcg Oral BID  . DISCONTD: ferric glucontate (NULECIT) IV  125 mg Intravenous Q M,W,F-HD  . 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 Improving, renal on board.  Active Problems:  Renal failure (ARF),  On peritoneal dialysis.   Hypotension resolved.   GERD (gastroesophageal reflux disease) on proto nix Nausea and Vomiting: resolved.  Anemia of chronic disease: on  darbepoetin.   dvt prophylaxis on sq heparin  LOS: 1 day   Theotis Gerdeman 08/20/2011, 2:03 PM

## 2011-08-20 NOTE — Progress Notes (Addendum)
Patient received from 2600 via w/c without c/o.  Noted that there was a calcium iv ordered for 1615 to be given, however there was no other result for ca+ level in results except for a calcium of 6.9 at 330am this morning.  Upon investigation the lab reported earlier (written in a previous progress note for 1430 today) that caused the md to order the calcium iv was actually a calcium pth from a lab draw from yesterday of 5.9.  Therefore, I spoke with dr Karleen Hampshire and dr deterding about calcium pth level from yesterday and calcium level from this am and the result was to cancel the order for iv calcium and continue with the po calcium that was already scheduled with a repeat level in the morning.  Patient was without c/o - continue to monitor

## 2011-08-21 LAB — COMPREHENSIVE METABOLIC PANEL
ALT: <5 U/L (ref 0–35)
Alkaline Phosphatase: 206 U/L — ABNORMAL HIGH (ref 39–117)
CO2: 33 mEq/L — ABNORMAL HIGH (ref 19–32)
GFR calc Af Amer: 4 mL/min — ABNORMAL LOW (ref >90)
GFR calc non Af Amer: 3 mL/min — ABNORMAL LOW (ref >90)
Glucose, Bld: 95 mg/dL (ref 70–99)
Potassium: 4.2 mEq/L (ref 3.5–5.1)
Sodium: 141 mEq/L (ref 135–145)

## 2011-08-21 LAB — CBC
Hemoglobin: 9.5 g/dL — ABNORMAL LOW (ref 12.0–15.0)
RBC: 3.46 MIL/uL — ABNORMAL LOW (ref 3.87–5.11)

## 2011-08-21 MED ORDER — CALCIUM CARBONATE 1250 MG/5ML PO SUSP
2000.0000 mg | Freq: Four times a day (QID) | ORAL | Status: DC
  Administered 2011-08-21 – 2011-08-23 (×10): 2000 mg via ORAL
  Filled 2011-08-21 (×9): qty 20

## 2011-08-21 MED ORDER — DELFLEX-LC/2.5% DEXTROSE 394 MOSM/L IP SOLN
INTRAPERITONEAL | Status: DC
  Administered 2011-08-22: 18:00:00 via INTRAPERITONEAL

## 2011-08-21 MED ORDER — DELFLEX-LC/2.5% DEXTROSE 394 MOSM/L IP SOLN
Freq: Once | INTRAPERITONEAL | Status: AC
  Administered 2011-08-21: via INTRAPERITONEAL

## 2011-08-21 MED ORDER — DELFLEX-LC/2.5% DEXTROSE 394 MOSM/L IP SOLN: INTRAPERITONEAL | Status: DC

## 2011-08-21 MED ORDER — DELFLEX-LM/1.5% DEXTROSE 346 MOSM/L IP SOLN: INTRAPERITONEAL | Status: DC

## 2011-08-21 NOTE — Progress Notes (Signed)
Subjective: Interval History: none.  Objective: Vital signs in last 24 hours: Temp:  [97.8 F (36.6 C)-99.1 F (37.3 C)] 97.8 F (36.6 C) (11/16 0500) Pulse Rate:  [68-78] 78  (11/16 0500) Resp:  [12-20] 18  (11/16 0500) BP: (115-141)/(78-95) 115/78 mmHg (11/16 0500) SpO2:  [97 %-100 %] 97 % (11/16 0500) Weight:  [62.4 kg (137 lb 9.1 oz)] 137 lb 9.1 oz (62.4 kg) (11/15 1500) Weight change: -0.6 kg (-1 lb 5.2 oz)  Intake/Output from previous day: 11/15 0701 - 11/16 0700 In: 360 [P.O.:360] Out: -  Intake/Output this shift:   General appearance: alert, cooperative and no distress Head: Normocephalic, without obvious abnormality, atraumatic Throat: abnormal findings: Dry mucosa Neck: no adenopathy, no carotid bruit, no JVD, supple, symmetrical, trachea midline and thyroid not enlarged, symmetric, no tenderness/mass/nodules Resp: clear to auscultation bilaterally Chest wall: no tenderness Cardio: regular rate and rhythm, S1, S2 normal, no murmur, click, rub or gallop GI: soft, non-tender; bowel sounds normal; no masses,  no organomegaly Extremities: extremities normal, atraumatic, no cyanosis or edema Dialysis Access: PD catheter exit site clean   Lab Results:  [facility] 08/21/11 0600 08/19/11 0704  WBC 5.3 5.4  HGB 9.5* 9.7*  HCT 31.0* 31.0*  PLT 201 273   BMET:   Basename 08/21/11 0600 08/20/11 0334  NA 141 139  K 4.2 4.9  CL 100 98  CO2 33* 33*  GLUCOSE 95 139*  BUN 23 25*  CREATININE 14.04* 15.16*  CALCIUM 6.4* 6.9*    Basename 08/19/11 0704  PTH 41.3   Iron Studies: No results found for this basename: IRON,TIBC,TRANSFERRIN,FERRITIN in the last 72 hours  Studies/Results: No results found.  Scheduled:    . calcitRIOL  0.5 mcg Oral BID  . calcium carbonate (dosed in mg elemental calcium)  1,000 mg of elemental calcium Oral QID  . darbepoetin  150 mcg Subcutaneous Q7 days  . dialysis solution 2.5% low-MG/low-CA   Intraperitoneal Once in dialysis  .  dialysis solution 2.5% low-MG/low-CA   Intraperitoneal Once in dialysis  . dialysis solution 2.5% low-MG/low-CA   Intraperitoneal Once in dialysis  . heparin  5,000 Units Subcutaneous Q8H  . pantoprazole  40 mg Oral BID  . DISCONTD: calcium gluconate  1 g Intravenous Once  . DISCONTD: doxercalciferol  2.5 mcg Oral BID   Continuous:    . dialysis solution 1.5% low-MG    . dialysis solution 2.5% low-MG/low-CA 5,000 mL (08/20/11 2334)  . dialysis solution 2.5% low-MG/low-CA    . dialysis solution 2.5% low-MG/low-CA      Assessment/Plan: Assessment/Plan: 1. 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  Off calcium infusion and calcium levels have improved.  Will d/c hectoral and place on calcitriol 0.8mcg bid to help with hypocalcemia and recheck in am.  Pt is currently asymptomatic.  Calcium has gone down.  Will increase oral calcium.  Will also order added calcium dialysate as an outpt 2. ESRD -  Continue with cycler.  Hold KCl for now.  Consider switching to IHD due to profound protein malnutrition 3 Hypertension/volume  - BP OK now 4. 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  continue with EPO and give IV Fe 5. Metabolic bone disease - see (1) above .  Corrected Ca is 7.8.  Will switch to calcitriol and follow calcium 6. Severe protein malnutrition-  continue with supplements and consider transition to IHD via catheter until albumin can improve 7.  N & V--per primary svc, ? Gastroparesis, improving. 8. dispo- still with dropping calcium will increase supplements and follow as an inpt for now    LOS: 2 days   Raven Walker A 08/21/2011,9:41 AM

## 2011-08-21 NOTE — Progress Notes (Signed)
Subjective: Denies any complaints.  Objective: Vital signs in last 24 hours: Filed Vitals:   08/21/11 0900 08/21/11 0930 08/21/11 1030 08/21/11 1425  BP: 106/70   112/82  Pulse: 72   77  Temp: 98.1 F (36.7 C)   98.1 F (36.7 C)  TempSrc: Oral   Oral  Resp: 18   18  Height:      Weight:  58.6 kg (129 lb 3 oz) 58.6 kg (129 lb 3 oz)   SpO2: 100%   100%   Weight change: -0.6 kg (-1 lb 5.2 oz)  Intake/Output Summary (Last 24 hours) at 08/21/11 1715 Last data filed at 08/21/11 1300  Gross per 24 hour  Intake    340 ml  Output      0 ml  Net    340 ml   Physical Exam:   General Appearance:    Alert, cooperative, no distress, appears stated age  Lungs:     Clear to auscultation bilaterally, respirations unlabored   Heart:    Regular rate and rhythm, S1 and S2 normal, no murmur, rub   or gallop  Abdomen:     Soft, non-tender, bowel sounds active all four quadrants,    no masses, no organomegaly  Extremities:   Extremities normal, atraumatic, no cyanosis or edema  Pulses:   2+ and symmetric all extremities           Lab Results: Results for orders placed during the hospital encounter of 08/19/11 (from the past 24 hour(s))  CBC     Status: Abnormal   Collection Time   08/21/11  6:00 AM      Component Value Range   WBC 5.3  4.0 - 10.5 (K/uL)   RBC 3.46 (*) 3.87 - 5.11 (MIL/uL)   Hemoglobin 9.5 (*) 12.0 - 15.0 (g/dL)   HCT 31.0 (*) 36.0 - 46.0 (%)   MCV 89.6  78.0 - 100.0 (fL)   MCH 27.5  26.0 - 34.0 (pg)   MCHC 30.6  30.0 - 36.0 (g/dL)   RDW 16.9 (*) 11.5 - 15.5 (%)   Platelets 201  150 - 400 (K/uL)  COMPREHENSIVE METABOLIC PANEL     Status: Abnormal   Collection Time   08/21/11  6:00 AM      Component Value Range   Sodium 141  135 - 145 (mEq/L)   Potassium 4.2  3.5 - 5.1 (mEq/L)   Chloride 100  96 - 112 (mEq/L)   CO2 33 (*) 19 - 32 (mEq/L)   Glucose, Bld 95  70 - 99 (mg/dL)   BUN 23  6 - 23 (mg/dL)   Creatinine, Ser 14.04 (*) 0.50 - 1.10 (mg/dL)   Calcium 6.4  (*) 8.4 - 10.5 (mg/dL)   Total Protein 4.9 (*) 6.0 - 8.3 (g/dL)   Albumin 1.8 (*) 3.5 - 5.2 (g/dL)   AST 13  0 - 37 (U/L)   ALT <5  0 - 35 (U/L)   Alkaline Phosphatase 206 (*) 39 - 117 (U/L)   Total Bilirubin 0.1 (*) 0.3 - 1.2 (mg/dL)   GFR calc non Af Amer 3 (*) >90 (mL/min)   GFR calc Af Amer 4 (*) >90 (mL/min)    Micro Results: Recent Results (from the past 240 hour(s))  MRSA PCR SCREENING     Status: Normal   Collection Time   08/19/11  6:15 PM      Component Value Range Status Comment   MRSA by PCR NEGATIVE  NEGATIVE  Final  Studies/Results: No results found. Medications: reviewed Scheduled Meds:   . calcitRIOL  0.5 mcg Oral BID  . calcium carbonate (dosed in mg elemental calcium)  2,000 mg of elemental calcium Oral QID  . darbepoetin  150 mcg Subcutaneous Q7 days  . dialysis solution 2.5% low-MG/low-CA   Intraperitoneal Once in dialysis  . dialysis solution 2.5% low-MG/low-CA   Intraperitoneal Once in dialysis  . dialysis solution 2.5% low-MG/low-CA   Intraperitoneal Once in dialysis  . heparin  5,000 Units Subcutaneous Q8H  . pantoprazole  40 mg Oral BID  . DISCONTD: calcium carbonate (dosed in mg elemental calcium)  1,000 mg of elemental calcium Oral QID  . DISCONTD: calcium gluconate  1 g Intravenous Once   Continuous Infusions:   . dialysis solution 1.5% low-MG    . dialysis solution 2.5% low-MG/low-CA 5,000 mL (08/20/11 2334)  . dialysis solution 2.5% low-MG/low-CA    . dialysis solution 2.5% low-MG/low-CA     PRN Meds:.acetaminophen, acetaminophen, camphor-menthol, docusate sodium, feeding supplement (NEPRO CARB STEADY), hydrOXYzine, oxyCODONE, promethazine, promethazine, sorbitol, zolpidem Assessment/Plan:  LOS: 2 days  Hypocalcemia syndrome: calcium level dropped a little this am, and Dr Marval Regal increased the calcium supplements. She is asymptomatic from the hypocalcemia.  Active Problems:  Renal failure (ARF), On peritoneal dialysis.  Hypotension  resolved.  GERD (gastroesophageal reflux disease) on proto nix  Nausea and Vomiting: resolved.  Anemia of chronic disease: on darbepoetin.   Raven Walker 08/21/2011, 5:15 PM

## 2011-08-22 LAB — RENAL FUNCTION PANEL
Albumin: 2 g/dL — ABNORMAL LOW (ref 3.5–5.2)
CO2: 33 mEq/L — ABNORMAL HIGH (ref 19–32)
Calcium: 6.8 mg/dL — ABNORMAL LOW (ref 8.4–10.5)
Creatinine, Ser: 12.66 mg/dL — ABNORMAL HIGH (ref 0.50–1.10)
GFR calc non Af Amer: 3 mL/min — ABNORMAL LOW (ref >90)

## 2011-08-22 LAB — CBC
Hemoglobin: 11.1 g/dL — ABNORMAL LOW (ref 12.0–15.0)
MCH: 28 pg (ref 26.0–34.0)
MCHC: 30.9 g/dL (ref 30.0–36.0)
MCV: 90.4 fL (ref 78.0–100.0)

## 2011-08-22 MED ORDER — CALCITRIOL 0.5 MCG PO CAPS
1.0000 ug | ORAL_CAPSULE | Freq: Two times a day (BID) | ORAL | Status: DC
  Administered 2011-08-22 – 2011-08-23 (×3): 1 ug via ORAL
  Filled 2011-08-22 (×4): qty 2

## 2011-08-22 MED ORDER — SODIUM CHLORIDE 0.9 % IJ SOLN
125.0000 mg | Freq: Every day | INTRAMUSCULAR | Status: DC
  Administered 2011-08-22 – 2011-08-23 (×2): 125 mg via INTRAVENOUS
  Filled 2011-08-22 (×3): qty 10

## 2011-08-22 NOTE — Progress Notes (Signed)
Subjective: No nausea or vomiting. Objective: Vital signs in last 24 hours: Filed Vitals:   08/21/11 1425 08/21/11 1800 08/22/11 0500 08/22/11 0934  BP: 112/82 122/70 101/69   Pulse: 77  65   Temp: 98.1 F (36.7 C)  98 F (36.7 C)   TempSrc: Oral  Oral   Resp: 18  16   Height:      Weight:   61.462 kg (135 lb 8 oz) 59.149 kg (130 lb 6.4 oz)  SpO2: 100%  99%    Weight change: -3.8 kg (-8 lb 6 oz)  Intake/Output Summary (Last 24 hours) at 08/22/11 1537 Last data filed at 08/22/11 0900  Gross per 24 hour  Intake    240 ml  Output      0 ml  Net    240 ml   Physical Exam:   General Appearance:    Alert, cooperative, no distress, appears stated age  Lungs:     Clear to auscultation bilaterally, respirations unlabored   Heart:    Regular rate and rhythm, S1 and S2 normal, no murmur,  Abdomen:     Soft, non-tender, bowel sounds active all four quadrants,    no masses, no organomegaly  Extremities:   Extremities normal, atraumatic, no cyanosis or edema  Pulses:   2+ and symmetric all extremities  Skin:   Skin color, texture, turgor normal, no rashes or lesions  Neurologic:   CNII-XII intact, normal strength, sensation and reflexes    throughout     Lab Results: Results for orders placed during the hospital encounter of 08/19/11 (from the past 24 hour(s))  RENAL FUNCTION PANEL     Status: Abnormal   Collection Time   08/22/11  8:15 AM      Component Value Range   Sodium 141  135 - 145 (mEq/L)   Potassium 4.2  3.5 - 5.1 (mEq/L)   Chloride 98  96 - 112 (mEq/L)   CO2 33 (*) 19 - 32 (mEq/L)   Glucose, Bld 82  70 - 99 (mg/dL)   BUN 21  6 - 23 (mg/dL)   Creatinine, Ser 12.66 (*) 0.50 - 1.10 (mg/dL)   Calcium 6.8 (*) 8.4 - 10.5 (mg/dL)   Phosphorus 3.4  2.3 - 4.6 (mg/dL)   Albumin 2.0 (*) 3.5 - 5.2 (g/dL)   GFR calc non Af Amer 3 (*) >90 (mL/min)   GFR calc Af Amer 4 (*) >90 (mL/min)  CBC     Status: Abnormal   Collection Time   08/22/11  8:15 AM      Component Value  Range   WBC 4.3  4.0 - 10.5 (K/uL)   RBC 3.97  3.87 - 5.11 (MIL/uL)   Hemoglobin 11.1 (*) 12.0 - 15.0 (g/dL)   HCT 35.9 (*) 36.0 - 46.0 (%)   MCV 90.4  78.0 - 100.0 (fL)   MCH 28.0  26.0 - 34.0 (pg)   MCHC 30.9  30.0 - 36.0 (g/dL)   RDW 17.5 (*) 11.5 - 15.5 (%)   Platelets 243  150 - 400 (K/uL)   Micro Results: Recent Results (from the past 240 hour(s))  MRSA PCR SCREENING     Status: Normal   Collection Time   08/19/11  6:15 PM      Component Value Range Status Comment   MRSA by PCR NEGATIVE  NEGATIVE  Final    Studies/Results: No results found. Medications: reviewed. Scheduled Meds:   . calcitRIOL  1 mcg Oral BID  . calcium  carbonate (dosed in mg elemental calcium)  2,000 mg of elemental calcium Oral QID  . darbepoetin  150 mcg Subcutaneous Q7 days  . dialysis solution 2.5% low-MG/low-CA   Intraperitoneal Once in dialysis  . dialysis solution 2.5% low-MG/low-CA   Intraperitoneal Once in dialysis  . dialysis solution 2.5% low-MG/low-CA   Intraperitoneal Once in dialysis  . ferric glucontate (NULECIT) IV  125 mg Intravenous Daily  . heparin  5,000 Units Subcutaneous Q8H  . pantoprazole  40 mg Oral BID  . DISCONTD: calcitRIOL  0.5 mcg Oral BID   Continuous Infusions:   . dialysis solution 1.5% low-MG    . dialysis solution 2.5% low-MG/low-CA 5,000 mL (08/20/11 2334)  . dialysis solution 2.5% low-MG/low-CA    . dialysis solution 2.5% low-MG/low-CA 5,000 mL (08/21/11 2339)   PRN Meds:.acetaminophen, acetaminophen, camphor-menthol, docusate sodium, feeding supplement (NEPRO CARB STEADY), hydrOXYzine, oxyCODONE, promethazine, promethazine, sorbitol, zolpidem Assessment/Plan: Hypocalcemia syndrome: calcium level improved on increasing the calcium supplements. She is asymptomatic from the hypocalcemia.  Active Problems:  Renal failure (ARF), On peritoneal dialysis.  Hypotension resolved.  GERD (gastroesophageal reflux disease) on proto nix  Nausea and Vomiting: resolved.    Anemia of chronic disease: on darbepoetin.     LOS: 3 days   Lorry Furber 08/22/2011, 3:37 PM

## 2011-08-22 NOTE — Progress Notes (Signed)
Subjective: No complaints, no nausea, fairly good appetite.  Objective: Vital signs in last 24 hours: Temp:  [98 F (36.7 C)-98.1 F (36.7 C)] 98 F (36.7 C) (11/17 0500) Pulse Rate:  [65-77] 65  (11/17 0500) Resp:  [16-18] 16  (11/17 0500) BP: (101-122)/(69-82) 101/69 mmHg (11/17 0500) SpO2:  [99 %-100 %] 99 % (11/17 0500) Weight:  [58.6 kg (129 lb 3 oz)-61.462 kg (135 lb 8 oz)] 135 lb 8 oz (61.462 kg) (11/17 0500) Weight change: -3.8 kg (-8 lb 6 oz)  Intake/Output from previous day: 11/16 0701 - 11/17 0700 In: 220 [P.O.:220] Out: -    EXAM: General appearance:  Alert, comfortable , in no apparent distress. Resp:  CTA B. Cardio:  RRR without murmur.  GI:  +BS, soft and nontender. Extremities:  No edema. Access:  PD catheter.  Lab Results:  Basename 08/21/11 0600  WBC 5.3  HGB 9.5*  HCT 31.0*  PLT 201   BMET:  Basename 08/21/11 0600 08/20/11 0334  NA 141 139  K 4.2 4.9  CL 100 98  CO2 33* 33*  GLUCOSE 95 139*  BUN 23 25*  CREATININE 14.04* 15.16*  CALCIUM 6.4* 6.9*  ALBUMIN 1.8* 1.7*   No results found for this basename: PTH:2 in the last 72 hours Iron Studies: No results found for this basename: IRON,TIBC,TRANSFERRIN,FERRITIN in the last 72 hours  Assessment/Plan: 1.  Hypocalcemia - secondary to parathyroidectomy on 08/04/11, on Calcitriol 0.59mcg PO bid and Calcium Carbonate 2000 mg PO qid, Ca 6.4 yesterday (corrected Ca 8.2), new labs still pending.  Patient still asymptomatic.  Will increase Calcitriol to 1 mcg PO bid. 2.  ESRD - On CCPD. Continue with cycler.   3.  Hypokalemia - K 4.2 yesterday, off PO KCl secondary to elevated K. 4.  HTN/Volume - BP controlled. 5.  Anemia - On EPO, Fe sat 16% on 11/5, Hgb 9.5 yesterday.  Give IV Fe.  6.  Malnutrition - Alb chronically low @ 1.8. 7. N & V - secondary to gastroparesis?, resolved.    LOS: 3 days   LYLES,CHARLES 08/22/2011,8:25 AM  Nephrology Attending:  I have seen and examined the patient.  I agree  with A/P as above.  Today's calcium level is 6.8, which is up slightly.  Increase vit D, continue po CaCO3.  Check chemistries daily.   Kelly Splinter, MD 08/22/2011, 6:40 PM.

## 2011-08-23 LAB — RENAL FUNCTION PANEL
BUN: 21 mg/dL (ref 6–23)
Calcium: 7.1 mg/dL — ABNORMAL LOW (ref 8.4–10.5)
Creatinine, Ser: 11.79 mg/dL — ABNORMAL HIGH (ref 0.50–1.10)
GFR calc Af Amer: 4 mL/min — ABNORMAL LOW (ref >90)
Glucose, Bld: 96 mg/dL (ref 70–99)
Phosphorus: 3.3 mg/dL (ref 2.3–4.6)
Sodium: 141 mEq/L (ref 135–145)

## 2011-08-23 LAB — CBC
HCT: 36.4 % (ref 36.0–46.0)
MCH: 27.5 pg (ref 26.0–34.0)
MCHC: 30.2 g/dL (ref 30.0–36.0)
MCV: 91 fL (ref 78.0–100.0)
RDW: 17.9 % — ABNORMAL HIGH (ref 11.5–15.5)

## 2011-08-23 MED ORDER — CALCIUM CARBONATE 1250 MG/5ML PO SUSP: 2000.0000 mg | Freq: Four times a day (QID) | ORAL | Status: DC

## 2011-08-23 MED ORDER — OXYCODONE HCL 5 MG PO TABS: 5.0000 mg | ORAL_TABLET | Freq: Four times a day (QID) | ORAL | Status: AC | PRN

## 2011-08-23 MED ORDER — OXYCODONE HCL 5 MG PO TABS: 5.0000 mg | ORAL_TABLET | Freq: Four times a day (QID) | ORAL | Status: DC | PRN

## 2011-08-23 MED ORDER — CALCITRIOL 0.5 MCG PO CAPS: 1.0000 ug | ORAL_CAPSULE | Freq: Two times a day (BID) | ORAL | Status: AC

## 2011-08-23 NOTE — Discharge Summary (Signed)
PATIENT DETAILS Name: Raven Walker Age: 30 y.o. Sex: female Date of Birth: 1980/11/10 MRN: [id number]. Admit Date: 08/19/2011 Admitting Physician: Jerusalem Brownstein YJE:HUDJ,SHFW, MD  PRIMARY DISCHARGE DIAGNOSIS:  Principal Problem: 1. Hypocalcemia syndrome   2.Renal failure (ARF), acute on chronic  3.Hypotension  4.GERD (gastroesophageal reflux disease) 5. Nausea and vomiting.  6. Prolonged QT interval 7. Anemia of chronic Disease 8. Anemia of Chronic Disease 9. Hypertension  PAST MEDICAL HISTORY: Past Medical History  Diagnosis Date  . Hypertension   . Poor appetite   . 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 GERD (gastroesophageal reflux disease) Dec. 2010    ulcerative esophagitis per EGD   . Secondary hyperparathyroidism     s/p parathyroidectomy with autotransplantation to left arm per Dr. Baker Pierini on 08/04/11  . Streptococcal peritonitis  Sept. 2012    DISCHARGE MEDICATIONS: Current Discharge Medication List    START taking these medications   Details  calcitRIOL (ROCALTROL) 0.5 MCG capsule Take 2 capsules (1 mcg total) by mouth 2 (two) times daily. Qty: 30 capsule, Refills: 0    calcium carbonate, dosed in mg elemental calcium, 1250 MG/5ML Take 20 mLs (2,000 mg of elemental calcium total) by mouth 4 (four) times daily. Qty: 450 mL, Refills: 2    oxyCODONE (OXY IR/ROXICODONE) 5 MG immediate release tablet Take 1 tablet (5 mg total) by mouth every 6 (six) hours as needed. Qty: 20 tablet, Refills: 0      CONTINUE these medications which have NOT CHANGED   Details  acetaminophen (TYLENOL) 325 MG tablet Take 650 mg by mouth every 6 (six) hours as needed. For pain     amLODipine (NORVASC) 10 MG tablet Take 10 mg by mouth at bedtime.     b complex-vitamin c-folic acid (NEPHRO-VITE) 0.8 MG TABS Take 0.8 mg by mouth at bedtime.      Doxercalciferol (HECTOROL PO) Take by mouth 3 (three) times daily.        epoetin alfa (EPOGEN,PROCRIT) 26378 UNIT/ML injection Inject 20,000 Units into the skin once a week.     pantoprazole (PROTONIX) 40 MG tablet Take 40 mg by mouth 2 (two) times daily.     potassium chloride 20 MEQ/15ML (10%) solution Take 80 mEq by mouth daily.      Sevelamer Carbonate 2.4 G PACK Take 2.4 g by mouth 3 (three) times daily with meals.           BRIEF HPI:  See H&P, Labs, Consult and Test reports for all details in brief, patient was admitted for persitent nausea and vomiting, she was found to be dehydrated, hypotension, with low calcium levels and with a prolonged QT interval.  CONSULTATIONS:   Renal Consult.   PERTINENT RADIOLOGIC STUDIES: No results found.   PERTINENT LAB RESULTS: CBC:  Basename 08/23/11 0610 08/22/11 0815  WBC 4.9 4.3  HGB 11.0* 11.1*  HCT 36.4 35.9*  PLT 241 243   CMET CMP     Component Value Date/Time   NA 141 08/23/2011 0610   K 3.7 08/23/2011 0610   CL 98 08/23/2011 0610   CO2 32 08/23/2011 0610   GLUCOSE 96 08/23/2011 0610   BUN 21 08/23/2011 0610   CREATININE 11.79* 08/23/2011 0610   CALCIUM 7.1* 08/23/2011 0610   CALCIUM 5.9* 08/19/2011 0704   PROT 4.9* 08/21/2011 0600   ALBUMIN 2.0* 08/23/2011 0610   AST 13 08/21/2011 0600   ALT <5 08/21/2011 0600  ALKPHOS 206* 08/21/2011 0600   BILITOT 0.1* 08/21/2011 0600   GFRNONAA 4* 08/23/2011 0610   GFRAA 4* 08/23/2011 0610    GFR Estimated Creatinine Clearance: 6.6 ml/min (by C-G formula based on Cr of 11.79). No results found for this basename: LIPASE:2,AMYLASE:2 in the last 72 hours No results found for this basename: CKTOTAL:3,CKMB:3,CKMBINDEX:3,TROPONINI:3 in the last 72 hours No results found for this basename: POCBNP:3 in the last 72 hours No results found for this basename: DDIMER:2 in the last 72 hours No results found for this basename: HGBA1C:2 in the last 72 hours No results found for this basename: CHOL:2,HDL:2,LDLCALC:2,TRIG:2,CHOLHDL:2,LDLDIRECT:2 in the  last 72 hours No results found for this basename: TSH,T4TOTAL,FREET3,T3FREE,THYROIDAB in the last 72 hours No results found for this basename: VITAMINB12:2,FOLATE:2,FERRITIN:2,TIBC:2,IRON:2,RETICCTPCT:2 in the last 72 hours Coags: No results found for this basename: PT:2,INR:2 in the last 72 hours Microbiology: Recent Results (from the past 240 hour(s))  MRSA PCR SCREENING     Status: Normal   Collection Time   08/19/11  6:15 PM      Component Value Range Status Comment   MRSA by PCR NEGATIVE  NEGATIVE  Final      BRIEF HOSPITAL COURSE: 30 year old with history of end stage renal disease on peritoneal dialysis had a thyroidectomy for second-grade hyperparathyroidism on October 31. 4 days prior to admission patient started having persistent nausea vomiting with malaise came to the ER for further evaluation and management she was found to have a profound hypocalcemia with a level of 6 and was found to be hypotensive she was admitted to hospitalist service for further evaluation and management  Principal Problem:  Hypocalcemia syndrome: She was started on calcium supplementation with elemental calcium 2500 mg 4 times a day and her calcium has improved to a level of 8.7 her as of today. Discussed with Dr. Jonnie Finner patient will be discharged home today and will followup with Dr. Trinidad Curet. on Monday for further management.  Active Problems: 1 nausea and vomiting all 2 end-stage renal disease on daily peritoneal dialysis. 3. Hypotension  Resolved  4.GERD on protonix.   TODAY-DAY OF DISCHARGE:  Subjective:   Raven Walker today has no headache,no chest abdominal pain,no new weakness tingling or numbness, feels much better wants to go home today. Tele monitor is good.   Objective:   Blood pressure 115/64, pulse 82, temperature 98.1 F (36.7 C), temperature source Oral, resp. rate 19, height $RemoveBe'5\' 9"'uhFNVocJP$  (1.753 m), weight 60.782 kg (134 lb), SpO2 98.00%.  Intake/Output Summary (Last 24  hours) at 08/23/11 1049 Last data filed at 08/23/11 0500  Gross per 24 hour  Intake      0 ml  Output      0 ml  Net      0 ml    Exam Awake Alert, Oriented 3, No new F.N deficits, Normal affect [place].AT,PERRAL Supple Neck,No JVD, No cervical lymphadenopathy appriciated.  Symmetrical Chest wall movement, Good air movement bilaterally, CTAB RRR,No Gallops,Rubs or new Murmurs, No Parasternal Heave +ve B.Sounds, Abd Soft, Non tender, No organomegaly appriciated, No rebound -guarding or rigidity. No Cyanosis, Clubbing or edema, No new Rash or bruise  DISPOSITION: Discharge Home.   DISCHARGE INSTRUCTIONS:    Follow-up Information    Follow up with HALL,ZACK in 2 weeks. (As needed)    Contact information:   [address] [place] [phone]       Follow up with DETERDING,JAMES L, MD on 08/24/2011.   Contact information:  [place] [phone]       Follow up with [facility]s A. (As needed)    Contact information:   [address] [place] [phone]           Total Time spent on discharge equals 45 minutes.  SignedHosie Poisson 08/23/2011 10:49 AM

## 2011-08-26 ENCOUNTER — Encounter (INDEPENDENT_AMBULATORY_CARE_PROVIDER_SITE_OTHER): Payer: Self-pay | Admitting: Surgery

## 2011-08-26 ENCOUNTER — Ambulatory Visit (INDEPENDENT_AMBULATORY_CARE_PROVIDER_SITE_OTHER): Payer: Medicare Other | Admitting: Surgery

## 2011-08-26 VITALS — BP 102/78 | HR 66 | Temp 97.8°F | Resp 14 | Ht 69.0 in | Wt 137.0 lb

## 2011-08-26 DIAGNOSIS — N2581 Secondary hyperparathyroidism of renal origin: Secondary | ICD-10-CM

## 2011-08-26 NOTE — Patient Instructions (Signed)
  COCOA BUTTER & VITAMIN E CREAM  ([employer] or other brand)  Apply cocoa butter/vitamin E cream to your incision 2 - 3 times daily.  Massage cream into incision for one minute with each application.  Use sunscreen (50 SPF or higher) for first 6 months after surgery.  You may substitute Mederma or other scar reducing creams as desired.   

## 2011-08-26 NOTE — Progress Notes (Signed)
Visit Diagnoses: 1. Hyperparathyroidism, secondary     HISTORY: Patient returns for her first postoperative visit having undergone total parathyroidectomy with autotransplantation to the left forearm.  EXAM: Surgical wounds are healing uneventfully. No sign of seroma. No sign of infection. Voice quality is normal.  IMPRESSION: Secondary hyperparathyroidism, status post total parathyroidectomy with autotransplantation, postoperative hypocalcemia  PLAN: Patient will begin applying topical creams to her incisions. She will continue close followup with her nephrologist. She will return to see me as needed.   Earnstine Regal, MD, [facility]

## 2011-10-06 DIAGNOSIS — N189 Chronic kidney disease, unspecified: Secondary | ICD-10-CM

## 2011-10-06 HISTORY — DX: Chronic kidney disease, unspecified: N18.9

## 2011-10-06 HISTORY — PX: KIDNEY TRANSPLANT: SHX239

## 2011-11-05 DIAGNOSIS — N186 End stage renal disease: Secondary | ICD-10-CM | POA: Diagnosis not present

## 2011-11-26 DIAGNOSIS — Z01419 Encounter for gynecological examination (general) (routine) without abnormal findings: Secondary | ICD-10-CM | POA: Diagnosis not present

## 2011-12-04 DIAGNOSIS — D509 Iron deficiency anemia, unspecified: Secondary | ICD-10-CM | POA: Diagnosis not present

## 2011-12-04 DIAGNOSIS — N186 End stage renal disease: Secondary | ICD-10-CM | POA: Diagnosis not present

## 2011-12-04 DIAGNOSIS — N2581 Secondary hyperparathyroidism of renal origin: Secondary | ICD-10-CM | POA: Diagnosis not present

## 2011-12-05 DIAGNOSIS — D509 Iron deficiency anemia, unspecified: Secondary | ICD-10-CM | POA: Diagnosis not present

## 2011-12-05 DIAGNOSIS — N2581 Secondary hyperparathyroidism of renal origin: Secondary | ICD-10-CM | POA: Diagnosis not present

## 2011-12-05 DIAGNOSIS — N186 End stage renal disease: Secondary | ICD-10-CM | POA: Diagnosis not present

## 2011-12-06 DIAGNOSIS — N186 End stage renal disease: Secondary | ICD-10-CM | POA: Diagnosis not present

## 2011-12-06 DIAGNOSIS — N2581 Secondary hyperparathyroidism of renal origin: Secondary | ICD-10-CM | POA: Diagnosis not present

## 2011-12-06 DIAGNOSIS — D509 Iron deficiency anemia, unspecified: Secondary | ICD-10-CM | POA: Diagnosis not present

## 2011-12-07 DIAGNOSIS — N2581 Secondary hyperparathyroidism of renal origin: Secondary | ICD-10-CM | POA: Diagnosis not present

## 2011-12-07 DIAGNOSIS — N186 End stage renal disease: Secondary | ICD-10-CM | POA: Diagnosis not present

## 2011-12-07 DIAGNOSIS — D509 Iron deficiency anemia, unspecified: Secondary | ICD-10-CM | POA: Diagnosis not present

## 2011-12-08 DIAGNOSIS — N186 End stage renal disease: Secondary | ICD-10-CM | POA: Diagnosis not present

## 2011-12-08 DIAGNOSIS — N2581 Secondary hyperparathyroidism of renal origin: Secondary | ICD-10-CM | POA: Diagnosis not present

## 2011-12-08 DIAGNOSIS — D509 Iron deficiency anemia, unspecified: Secondary | ICD-10-CM | POA: Diagnosis not present

## 2011-12-09 DIAGNOSIS — N2581 Secondary hyperparathyroidism of renal origin: Secondary | ICD-10-CM | POA: Diagnosis not present

## 2011-12-09 DIAGNOSIS — N186 End stage renal disease: Secondary | ICD-10-CM | POA: Diagnosis not present

## 2011-12-09 DIAGNOSIS — D509 Iron deficiency anemia, unspecified: Secondary | ICD-10-CM | POA: Diagnosis not present

## 2011-12-10 DIAGNOSIS — N186 End stage renal disease: Secondary | ICD-10-CM | POA: Diagnosis not present

## 2011-12-10 DIAGNOSIS — N2581 Secondary hyperparathyroidism of renal origin: Secondary | ICD-10-CM | POA: Diagnosis not present

## 2011-12-10 DIAGNOSIS — D509 Iron deficiency anemia, unspecified: Secondary | ICD-10-CM | POA: Diagnosis not present

## 2011-12-11 DIAGNOSIS — N186 End stage renal disease: Secondary | ICD-10-CM | POA: Diagnosis not present

## 2011-12-11 DIAGNOSIS — N2581 Secondary hyperparathyroidism of renal origin: Secondary | ICD-10-CM | POA: Diagnosis not present

## 2011-12-11 DIAGNOSIS — D509 Iron deficiency anemia, unspecified: Secondary | ICD-10-CM | POA: Diagnosis not present

## 2011-12-12 DIAGNOSIS — D509 Iron deficiency anemia, unspecified: Secondary | ICD-10-CM | POA: Diagnosis not present

## 2011-12-12 DIAGNOSIS — N2581 Secondary hyperparathyroidism of renal origin: Secondary | ICD-10-CM | POA: Diagnosis not present

## 2011-12-12 DIAGNOSIS — N186 End stage renal disease: Secondary | ICD-10-CM | POA: Diagnosis not present

## 2011-12-13 DIAGNOSIS — N186 End stage renal disease: Secondary | ICD-10-CM | POA: Diagnosis not present

## 2011-12-13 DIAGNOSIS — N2581 Secondary hyperparathyroidism of renal origin: Secondary | ICD-10-CM | POA: Diagnosis not present

## 2011-12-13 DIAGNOSIS — D509 Iron deficiency anemia, unspecified: Secondary | ICD-10-CM | POA: Diagnosis not present

## 2011-12-14 DIAGNOSIS — N2581 Secondary hyperparathyroidism of renal origin: Secondary | ICD-10-CM | POA: Diagnosis not present

## 2011-12-14 DIAGNOSIS — D509 Iron deficiency anemia, unspecified: Secondary | ICD-10-CM | POA: Diagnosis not present

## 2011-12-14 DIAGNOSIS — N186 End stage renal disease: Secondary | ICD-10-CM | POA: Diagnosis not present

## 2011-12-15 DIAGNOSIS — D509 Iron deficiency anemia, unspecified: Secondary | ICD-10-CM | POA: Diagnosis not present

## 2011-12-15 DIAGNOSIS — N2581 Secondary hyperparathyroidism of renal origin: Secondary | ICD-10-CM | POA: Diagnosis not present

## 2011-12-15 DIAGNOSIS — N186 End stage renal disease: Secondary | ICD-10-CM | POA: Diagnosis not present

## 2011-12-16 DIAGNOSIS — N2581 Secondary hyperparathyroidism of renal origin: Secondary | ICD-10-CM | POA: Diagnosis not present

## 2011-12-16 DIAGNOSIS — N186 End stage renal disease: Secondary | ICD-10-CM | POA: Diagnosis not present

## 2011-12-16 DIAGNOSIS — D509 Iron deficiency anemia, unspecified: Secondary | ICD-10-CM | POA: Diagnosis not present

## 2011-12-17 DIAGNOSIS — N186 End stage renal disease: Secondary | ICD-10-CM | POA: Diagnosis not present

## 2011-12-17 DIAGNOSIS — D509 Iron deficiency anemia, unspecified: Secondary | ICD-10-CM | POA: Diagnosis not present

## 2011-12-17 DIAGNOSIS — N2581 Secondary hyperparathyroidism of renal origin: Secondary | ICD-10-CM | POA: Diagnosis not present

## 2011-12-18 DIAGNOSIS — N186 End stage renal disease: Secondary | ICD-10-CM | POA: Diagnosis not present

## 2011-12-18 DIAGNOSIS — N2581 Secondary hyperparathyroidism of renal origin: Secondary | ICD-10-CM | POA: Diagnosis not present

## 2011-12-18 DIAGNOSIS — D509 Iron deficiency anemia, unspecified: Secondary | ICD-10-CM | POA: Diagnosis not present

## 2011-12-19 DIAGNOSIS — N186 End stage renal disease: Secondary | ICD-10-CM | POA: Diagnosis not present

## 2011-12-19 DIAGNOSIS — D509 Iron deficiency anemia, unspecified: Secondary | ICD-10-CM | POA: Diagnosis not present

## 2011-12-19 DIAGNOSIS — N2581 Secondary hyperparathyroidism of renal origin: Secondary | ICD-10-CM | POA: Diagnosis not present

## 2011-12-20 DIAGNOSIS — N186 End stage renal disease: Secondary | ICD-10-CM | POA: Diagnosis not present

## 2011-12-20 DIAGNOSIS — N2581 Secondary hyperparathyroidism of renal origin: Secondary | ICD-10-CM | POA: Diagnosis not present

## 2011-12-20 DIAGNOSIS — D509 Iron deficiency anemia, unspecified: Secondary | ICD-10-CM | POA: Diagnosis not present

## 2011-12-21 DIAGNOSIS — N186 End stage renal disease: Secondary | ICD-10-CM | POA: Diagnosis not present

## 2011-12-21 DIAGNOSIS — D509 Iron deficiency anemia, unspecified: Secondary | ICD-10-CM | POA: Diagnosis not present

## 2011-12-21 DIAGNOSIS — N2581 Secondary hyperparathyroidism of renal origin: Secondary | ICD-10-CM | POA: Diagnosis not present

## 2011-12-22 DIAGNOSIS — N186 End stage renal disease: Secondary | ICD-10-CM | POA: Diagnosis not present

## 2011-12-22 DIAGNOSIS — N2581 Secondary hyperparathyroidism of renal origin: Secondary | ICD-10-CM | POA: Diagnosis not present

## 2011-12-22 DIAGNOSIS — D509 Iron deficiency anemia, unspecified: Secondary | ICD-10-CM | POA: Diagnosis not present

## 2011-12-23 DIAGNOSIS — N186 End stage renal disease: Secondary | ICD-10-CM | POA: Diagnosis not present

## 2011-12-23 DIAGNOSIS — N2581 Secondary hyperparathyroidism of renal origin: Secondary | ICD-10-CM | POA: Diagnosis not present

## 2011-12-23 DIAGNOSIS — D509 Iron deficiency anemia, unspecified: Secondary | ICD-10-CM | POA: Diagnosis not present

## 2011-12-24 DIAGNOSIS — N186 End stage renal disease: Secondary | ICD-10-CM | POA: Diagnosis not present

## 2011-12-24 DIAGNOSIS — D509 Iron deficiency anemia, unspecified: Secondary | ICD-10-CM | POA: Diagnosis not present

## 2011-12-24 DIAGNOSIS — N2581 Secondary hyperparathyroidism of renal origin: Secondary | ICD-10-CM | POA: Diagnosis not present

## 2011-12-25 DIAGNOSIS — N186 End stage renal disease: Secondary | ICD-10-CM | POA: Diagnosis not present

## 2011-12-25 DIAGNOSIS — D509 Iron deficiency anemia, unspecified: Secondary | ICD-10-CM | POA: Diagnosis not present

## 2011-12-25 DIAGNOSIS — N2581 Secondary hyperparathyroidism of renal origin: Secondary | ICD-10-CM | POA: Diagnosis not present

## 2011-12-26 DIAGNOSIS — N186 End stage renal disease: Secondary | ICD-10-CM | POA: Diagnosis not present

## 2011-12-26 DIAGNOSIS — N2581 Secondary hyperparathyroidism of renal origin: Secondary | ICD-10-CM | POA: Diagnosis not present

## 2011-12-26 DIAGNOSIS — D509 Iron deficiency anemia, unspecified: Secondary | ICD-10-CM | POA: Diagnosis not present

## 2011-12-27 DIAGNOSIS — D509 Iron deficiency anemia, unspecified: Secondary | ICD-10-CM | POA: Diagnosis not present

## 2011-12-27 DIAGNOSIS — N186 End stage renal disease: Secondary | ICD-10-CM | POA: Diagnosis not present

## 2011-12-27 DIAGNOSIS — N2581 Secondary hyperparathyroidism of renal origin: Secondary | ICD-10-CM | POA: Diagnosis not present

## 2011-12-28 DIAGNOSIS — D509 Iron deficiency anemia, unspecified: Secondary | ICD-10-CM | POA: Diagnosis not present

## 2011-12-28 DIAGNOSIS — N186 End stage renal disease: Secondary | ICD-10-CM | POA: Diagnosis not present

## 2011-12-28 DIAGNOSIS — N2581 Secondary hyperparathyroidism of renal origin: Secondary | ICD-10-CM | POA: Diagnosis not present

## 2011-12-29 DIAGNOSIS — N2581 Secondary hyperparathyroidism of renal origin: Secondary | ICD-10-CM | POA: Diagnosis not present

## 2011-12-29 DIAGNOSIS — D509 Iron deficiency anemia, unspecified: Secondary | ICD-10-CM | POA: Diagnosis not present

## 2011-12-29 DIAGNOSIS — N186 End stage renal disease: Secondary | ICD-10-CM | POA: Diagnosis not present

## 2011-12-30 DIAGNOSIS — N2581 Secondary hyperparathyroidism of renal origin: Secondary | ICD-10-CM | POA: Diagnosis not present

## 2011-12-30 DIAGNOSIS — N186 End stage renal disease: Secondary | ICD-10-CM | POA: Diagnosis not present

## 2011-12-30 DIAGNOSIS — D509 Iron deficiency anemia, unspecified: Secondary | ICD-10-CM | POA: Diagnosis not present

## 2011-12-31 DIAGNOSIS — N186 End stage renal disease: Secondary | ICD-10-CM | POA: Diagnosis not present

## 2011-12-31 DIAGNOSIS — N2581 Secondary hyperparathyroidism of renal origin: Secondary | ICD-10-CM | POA: Diagnosis not present

## 2011-12-31 DIAGNOSIS — D509 Iron deficiency anemia, unspecified: Secondary | ICD-10-CM | POA: Diagnosis not present

## 2012-01-01 DIAGNOSIS — N186 End stage renal disease: Secondary | ICD-10-CM | POA: Diagnosis not present

## 2012-01-01 DIAGNOSIS — D509 Iron deficiency anemia, unspecified: Secondary | ICD-10-CM | POA: Diagnosis not present

## 2012-01-01 DIAGNOSIS — N2581 Secondary hyperparathyroidism of renal origin: Secondary | ICD-10-CM | POA: Diagnosis not present

## 2012-01-02 DIAGNOSIS — D509 Iron deficiency anemia, unspecified: Secondary | ICD-10-CM | POA: Diagnosis not present

## 2012-01-02 DIAGNOSIS — N186 End stage renal disease: Secondary | ICD-10-CM | POA: Diagnosis not present

## 2012-01-02 DIAGNOSIS — N2581 Secondary hyperparathyroidism of renal origin: Secondary | ICD-10-CM | POA: Diagnosis not present

## 2012-01-03 DIAGNOSIS — N2581 Secondary hyperparathyroidism of renal origin: Secondary | ICD-10-CM | POA: Diagnosis not present

## 2012-01-03 DIAGNOSIS — N186 End stage renal disease: Secondary | ICD-10-CM | POA: Diagnosis not present

## 2012-01-03 DIAGNOSIS — D509 Iron deficiency anemia, unspecified: Secondary | ICD-10-CM | POA: Diagnosis not present

## 2012-01-04 DIAGNOSIS — D509 Iron deficiency anemia, unspecified: Secondary | ICD-10-CM | POA: Diagnosis not present

## 2012-01-04 DIAGNOSIS — N186 End stage renal disease: Secondary | ICD-10-CM | POA: Diagnosis not present

## 2012-01-18 DIAGNOSIS — Z940 Kidney transplant status: Secondary | ICD-10-CM | POA: Diagnosis not present

## 2012-01-18 DIAGNOSIS — N186 End stage renal disease: Secondary | ICD-10-CM | POA: Diagnosis not present

## 2012-01-18 DIAGNOSIS — Z01818 Encounter for other preprocedural examination: Secondary | ICD-10-CM | POA: Diagnosis not present

## 2012-02-02 DIAGNOSIS — N186 End stage renal disease: Secondary | ICD-10-CM | POA: Diagnosis not present

## 2012-02-03 DIAGNOSIS — N186 End stage renal disease: Secondary | ICD-10-CM | POA: Diagnosis not present

## 2012-02-03 DIAGNOSIS — D509 Iron deficiency anemia, unspecified: Secondary | ICD-10-CM | POA: Diagnosis not present

## 2012-02-05 DIAGNOSIS — N2581 Secondary hyperparathyroidism of renal origin: Secondary | ICD-10-CM | POA: Diagnosis not present

## 2012-02-05 DIAGNOSIS — N186 End stage renal disease: Secondary | ICD-10-CM | POA: Diagnosis not present

## 2012-03-04 DIAGNOSIS — N186 End stage renal disease: Secondary | ICD-10-CM | POA: Diagnosis not present

## 2012-03-05 DIAGNOSIS — Z992 Dependence on renal dialysis: Secondary | ICD-10-CM | POA: Diagnosis not present

## 2012-03-05 DIAGNOSIS — N2581 Secondary hyperparathyroidism of renal origin: Secondary | ICD-10-CM | POA: Diagnosis not present

## 2012-03-05 DIAGNOSIS — D509 Iron deficiency anemia, unspecified: Secondary | ICD-10-CM | POA: Diagnosis not present

## 2012-03-05 DIAGNOSIS — N186 End stage renal disease: Secondary | ICD-10-CM | POA: Diagnosis not present

## 2012-03-05 DIAGNOSIS — D631 Anemia in chronic kidney disease: Secondary | ICD-10-CM | POA: Diagnosis not present

## 2012-03-06 DIAGNOSIS — D509 Iron deficiency anemia, unspecified: Secondary | ICD-10-CM | POA: Diagnosis not present

## 2012-03-06 DIAGNOSIS — N2581 Secondary hyperparathyroidism of renal origin: Secondary | ICD-10-CM | POA: Diagnosis not present

## 2012-03-06 DIAGNOSIS — Z992 Dependence on renal dialysis: Secondary | ICD-10-CM | POA: Diagnosis not present

## 2012-03-06 DIAGNOSIS — D631 Anemia in chronic kidney disease: Secondary | ICD-10-CM | POA: Diagnosis not present

## 2012-03-06 DIAGNOSIS — N186 End stage renal disease: Secondary | ICD-10-CM | POA: Diagnosis not present

## 2012-03-07 DIAGNOSIS — D509 Iron deficiency anemia, unspecified: Secondary | ICD-10-CM | POA: Diagnosis not present

## 2012-03-07 DIAGNOSIS — N2581 Secondary hyperparathyroidism of renal origin: Secondary | ICD-10-CM | POA: Diagnosis not present

## 2012-03-07 DIAGNOSIS — D631 Anemia in chronic kidney disease: Secondary | ICD-10-CM | POA: Diagnosis not present

## 2012-03-07 DIAGNOSIS — Z992 Dependence on renal dialysis: Secondary | ICD-10-CM | POA: Diagnosis not present

## 2012-03-07 DIAGNOSIS — N186 End stage renal disease: Secondary | ICD-10-CM | POA: Diagnosis not present

## 2012-03-08 DIAGNOSIS — Z992 Dependence on renal dialysis: Secondary | ICD-10-CM | POA: Diagnosis not present

## 2012-03-08 DIAGNOSIS — N2581 Secondary hyperparathyroidism of renal origin: Secondary | ICD-10-CM | POA: Diagnosis not present

## 2012-03-08 DIAGNOSIS — N186 End stage renal disease: Secondary | ICD-10-CM | POA: Diagnosis not present

## 2012-03-08 DIAGNOSIS — D509 Iron deficiency anemia, unspecified: Secondary | ICD-10-CM | POA: Diagnosis not present

## 2012-03-08 DIAGNOSIS — D631 Anemia in chronic kidney disease: Secondary | ICD-10-CM | POA: Diagnosis not present

## 2012-03-09 DIAGNOSIS — D509 Iron deficiency anemia, unspecified: Secondary | ICD-10-CM | POA: Diagnosis not present

## 2012-03-09 DIAGNOSIS — N186 End stage renal disease: Secondary | ICD-10-CM | POA: Diagnosis not present

## 2012-03-09 DIAGNOSIS — Z01818 Encounter for other preprocedural examination: Secondary | ICD-10-CM | POA: Diagnosis not present

## 2012-03-09 DIAGNOSIS — N2581 Secondary hyperparathyroidism of renal origin: Secondary | ICD-10-CM | POA: Diagnosis not present

## 2012-03-09 DIAGNOSIS — D631 Anemia in chronic kidney disease: Secondary | ICD-10-CM | POA: Diagnosis not present

## 2012-03-09 DIAGNOSIS — Z940 Kidney transplant status: Secondary | ICD-10-CM | POA: Diagnosis not present

## 2012-03-09 DIAGNOSIS — Z992 Dependence on renal dialysis: Secondary | ICD-10-CM | POA: Diagnosis not present

## 2012-03-10 DIAGNOSIS — N186 End stage renal disease: Secondary | ICD-10-CM | POA: Diagnosis not present

## 2012-03-10 DIAGNOSIS — Z992 Dependence on renal dialysis: Secondary | ICD-10-CM | POA: Diagnosis not present

## 2012-03-10 DIAGNOSIS — D509 Iron deficiency anemia, unspecified: Secondary | ICD-10-CM | POA: Diagnosis not present

## 2012-03-10 DIAGNOSIS — D631 Anemia in chronic kidney disease: Secondary | ICD-10-CM | POA: Diagnosis not present

## 2012-03-10 DIAGNOSIS — N2581 Secondary hyperparathyroidism of renal origin: Secondary | ICD-10-CM | POA: Diagnosis not present

## 2012-03-11 DIAGNOSIS — D631 Anemia in chronic kidney disease: Secondary | ICD-10-CM | POA: Diagnosis not present

## 2012-03-11 DIAGNOSIS — Z992 Dependence on renal dialysis: Secondary | ICD-10-CM | POA: Diagnosis not present

## 2012-03-11 DIAGNOSIS — N186 End stage renal disease: Secondary | ICD-10-CM | POA: Diagnosis not present

## 2012-03-11 DIAGNOSIS — N2581 Secondary hyperparathyroidism of renal origin: Secondary | ICD-10-CM | POA: Diagnosis not present

## 2012-03-11 DIAGNOSIS — D509 Iron deficiency anemia, unspecified: Secondary | ICD-10-CM | POA: Diagnosis not present

## 2012-03-12 DIAGNOSIS — D631 Anemia in chronic kidney disease: Secondary | ICD-10-CM | POA: Diagnosis not present

## 2012-03-12 DIAGNOSIS — N186 End stage renal disease: Secondary | ICD-10-CM | POA: Diagnosis not present

## 2012-03-12 DIAGNOSIS — D509 Iron deficiency anemia, unspecified: Secondary | ICD-10-CM | POA: Diagnosis not present

## 2012-03-12 DIAGNOSIS — N2581 Secondary hyperparathyroidism of renal origin: Secondary | ICD-10-CM | POA: Diagnosis not present

## 2012-03-12 DIAGNOSIS — Z992 Dependence on renal dialysis: Secondary | ICD-10-CM | POA: Diagnosis not present

## 2012-03-13 DIAGNOSIS — N186 End stage renal disease: Secondary | ICD-10-CM | POA: Diagnosis not present

## 2012-03-13 DIAGNOSIS — D509 Iron deficiency anemia, unspecified: Secondary | ICD-10-CM | POA: Diagnosis not present

## 2012-03-13 DIAGNOSIS — N2581 Secondary hyperparathyroidism of renal origin: Secondary | ICD-10-CM | POA: Diagnosis not present

## 2012-03-13 DIAGNOSIS — Z992 Dependence on renal dialysis: Secondary | ICD-10-CM | POA: Diagnosis not present

## 2012-03-13 DIAGNOSIS — D631 Anemia in chronic kidney disease: Secondary | ICD-10-CM | POA: Diagnosis not present

## 2012-03-14 DIAGNOSIS — Z992 Dependence on renal dialysis: Secondary | ICD-10-CM | POA: Diagnosis not present

## 2012-03-14 DIAGNOSIS — N2581 Secondary hyperparathyroidism of renal origin: Secondary | ICD-10-CM | POA: Diagnosis not present

## 2012-03-14 DIAGNOSIS — N186 End stage renal disease: Secondary | ICD-10-CM | POA: Diagnosis not present

## 2012-03-14 DIAGNOSIS — D631 Anemia in chronic kidney disease: Secondary | ICD-10-CM | POA: Diagnosis not present

## 2012-03-14 DIAGNOSIS — D509 Iron deficiency anemia, unspecified: Secondary | ICD-10-CM | POA: Diagnosis not present

## 2012-03-15 DIAGNOSIS — D631 Anemia in chronic kidney disease: Secondary | ICD-10-CM | POA: Diagnosis not present

## 2012-03-15 DIAGNOSIS — D509 Iron deficiency anemia, unspecified: Secondary | ICD-10-CM | POA: Diagnosis not present

## 2012-03-15 DIAGNOSIS — N2581 Secondary hyperparathyroidism of renal origin: Secondary | ICD-10-CM | POA: Diagnosis not present

## 2012-03-15 DIAGNOSIS — Z992 Dependence on renal dialysis: Secondary | ICD-10-CM | POA: Diagnosis not present

## 2012-03-15 DIAGNOSIS — N186 End stage renal disease: Secondary | ICD-10-CM | POA: Diagnosis not present

## 2012-03-16 DIAGNOSIS — N186 End stage renal disease: Secondary | ICD-10-CM | POA: Diagnosis not present

## 2012-03-16 DIAGNOSIS — N2581 Secondary hyperparathyroidism of renal origin: Secondary | ICD-10-CM | POA: Diagnosis not present

## 2012-03-16 DIAGNOSIS — Z992 Dependence on renal dialysis: Secondary | ICD-10-CM | POA: Diagnosis not present

## 2012-03-16 DIAGNOSIS — D509 Iron deficiency anemia, unspecified: Secondary | ICD-10-CM | POA: Diagnosis not present

## 2012-03-16 DIAGNOSIS — D631 Anemia in chronic kidney disease: Secondary | ICD-10-CM | POA: Diagnosis not present

## 2012-03-17 DIAGNOSIS — Z992 Dependence on renal dialysis: Secondary | ICD-10-CM | POA: Diagnosis not present

## 2012-03-17 DIAGNOSIS — N2581 Secondary hyperparathyroidism of renal origin: Secondary | ICD-10-CM | POA: Diagnosis not present

## 2012-03-17 DIAGNOSIS — D509 Iron deficiency anemia, unspecified: Secondary | ICD-10-CM | POA: Diagnosis not present

## 2012-03-17 DIAGNOSIS — N186 End stage renal disease: Secondary | ICD-10-CM | POA: Diagnosis not present

## 2012-03-17 DIAGNOSIS — D631 Anemia in chronic kidney disease: Secondary | ICD-10-CM | POA: Diagnosis not present

## 2012-03-18 DIAGNOSIS — N2581 Secondary hyperparathyroidism of renal origin: Secondary | ICD-10-CM | POA: Diagnosis not present

## 2012-03-18 DIAGNOSIS — N186 End stage renal disease: Secondary | ICD-10-CM | POA: Diagnosis not present

## 2012-03-18 DIAGNOSIS — D509 Iron deficiency anemia, unspecified: Secondary | ICD-10-CM | POA: Diagnosis not present

## 2012-03-18 DIAGNOSIS — D631 Anemia in chronic kidney disease: Secondary | ICD-10-CM | POA: Diagnosis not present

## 2012-03-18 DIAGNOSIS — Z992 Dependence on renal dialysis: Secondary | ICD-10-CM | POA: Diagnosis not present

## 2012-03-19 DIAGNOSIS — N2581 Secondary hyperparathyroidism of renal origin: Secondary | ICD-10-CM | POA: Diagnosis not present

## 2012-03-19 DIAGNOSIS — D631 Anemia in chronic kidney disease: Secondary | ICD-10-CM | POA: Diagnosis not present

## 2012-03-19 DIAGNOSIS — Z992 Dependence on renal dialysis: Secondary | ICD-10-CM | POA: Diagnosis not present

## 2012-03-19 DIAGNOSIS — N186 End stage renal disease: Secondary | ICD-10-CM | POA: Diagnosis not present

## 2012-03-19 DIAGNOSIS — D509 Iron deficiency anemia, unspecified: Secondary | ICD-10-CM | POA: Diagnosis not present

## 2012-03-20 DIAGNOSIS — N186 End stage renal disease: Secondary | ICD-10-CM | POA: Diagnosis not present

## 2012-03-20 DIAGNOSIS — D631 Anemia in chronic kidney disease: Secondary | ICD-10-CM | POA: Diagnosis not present

## 2012-03-20 DIAGNOSIS — Z992 Dependence on renal dialysis: Secondary | ICD-10-CM | POA: Diagnosis not present

## 2012-03-20 DIAGNOSIS — N2581 Secondary hyperparathyroidism of renal origin: Secondary | ICD-10-CM | POA: Diagnosis not present

## 2012-03-20 DIAGNOSIS — D509 Iron deficiency anemia, unspecified: Secondary | ICD-10-CM | POA: Diagnosis not present

## 2012-03-21 DIAGNOSIS — Z992 Dependence on renal dialysis: Secondary | ICD-10-CM | POA: Diagnosis not present

## 2012-03-21 DIAGNOSIS — D631 Anemia in chronic kidney disease: Secondary | ICD-10-CM | POA: Diagnosis not present

## 2012-03-21 DIAGNOSIS — N186 End stage renal disease: Secondary | ICD-10-CM | POA: Diagnosis not present

## 2012-03-21 DIAGNOSIS — N2581 Secondary hyperparathyroidism of renal origin: Secondary | ICD-10-CM | POA: Diagnosis not present

## 2012-03-21 DIAGNOSIS — D509 Iron deficiency anemia, unspecified: Secondary | ICD-10-CM | POA: Diagnosis not present

## 2012-03-22 DIAGNOSIS — D631 Anemia in chronic kidney disease: Secondary | ICD-10-CM | POA: Diagnosis not present

## 2012-03-22 DIAGNOSIS — D509 Iron deficiency anemia, unspecified: Secondary | ICD-10-CM | POA: Diagnosis not present

## 2012-03-22 DIAGNOSIS — N2581 Secondary hyperparathyroidism of renal origin: Secondary | ICD-10-CM | POA: Diagnosis not present

## 2012-03-22 DIAGNOSIS — Z992 Dependence on renal dialysis: Secondary | ICD-10-CM | POA: Diagnosis not present

## 2012-03-22 DIAGNOSIS — N186 End stage renal disease: Secondary | ICD-10-CM | POA: Diagnosis not present

## 2012-03-23 DIAGNOSIS — Z992 Dependence on renal dialysis: Secondary | ICD-10-CM | POA: Diagnosis not present

## 2012-03-23 DIAGNOSIS — D631 Anemia in chronic kidney disease: Secondary | ICD-10-CM | POA: Diagnosis not present

## 2012-03-23 DIAGNOSIS — N2581 Secondary hyperparathyroidism of renal origin: Secondary | ICD-10-CM | POA: Diagnosis not present

## 2012-03-23 DIAGNOSIS — N186 End stage renal disease: Secondary | ICD-10-CM | POA: Diagnosis not present

## 2012-03-23 DIAGNOSIS — D509 Iron deficiency anemia, unspecified: Secondary | ICD-10-CM | POA: Diagnosis not present

## 2012-03-24 DIAGNOSIS — D631 Anemia in chronic kidney disease: Secondary | ICD-10-CM | POA: Diagnosis not present

## 2012-03-24 DIAGNOSIS — N2581 Secondary hyperparathyroidism of renal origin: Secondary | ICD-10-CM | POA: Diagnosis not present

## 2012-03-24 DIAGNOSIS — N186 End stage renal disease: Secondary | ICD-10-CM | POA: Diagnosis not present

## 2012-03-24 DIAGNOSIS — D509 Iron deficiency anemia, unspecified: Secondary | ICD-10-CM | POA: Diagnosis not present

## 2012-03-24 DIAGNOSIS — Z992 Dependence on renal dialysis: Secondary | ICD-10-CM | POA: Diagnosis not present

## 2012-03-25 DIAGNOSIS — N2581 Secondary hyperparathyroidism of renal origin: Secondary | ICD-10-CM | POA: Diagnosis not present

## 2012-03-25 DIAGNOSIS — Z992 Dependence on renal dialysis: Secondary | ICD-10-CM | POA: Diagnosis not present

## 2012-03-25 DIAGNOSIS — D631 Anemia in chronic kidney disease: Secondary | ICD-10-CM | POA: Diagnosis not present

## 2012-03-25 DIAGNOSIS — D509 Iron deficiency anemia, unspecified: Secondary | ICD-10-CM | POA: Diagnosis not present

## 2012-03-25 DIAGNOSIS — N186 End stage renal disease: Secondary | ICD-10-CM | POA: Diagnosis not present

## 2012-03-26 DIAGNOSIS — N186 End stage renal disease: Secondary | ICD-10-CM | POA: Diagnosis not present

## 2012-03-26 DIAGNOSIS — Z992 Dependence on renal dialysis: Secondary | ICD-10-CM | POA: Diagnosis not present

## 2012-03-26 DIAGNOSIS — D509 Iron deficiency anemia, unspecified: Secondary | ICD-10-CM | POA: Diagnosis not present

## 2012-03-26 DIAGNOSIS — D631 Anemia in chronic kidney disease: Secondary | ICD-10-CM | POA: Diagnosis not present

## 2012-03-26 DIAGNOSIS — N2581 Secondary hyperparathyroidism of renal origin: Secondary | ICD-10-CM | POA: Diagnosis not present

## 2012-03-27 DIAGNOSIS — N186 End stage renal disease: Secondary | ICD-10-CM | POA: Diagnosis not present

## 2012-03-27 DIAGNOSIS — D509 Iron deficiency anemia, unspecified: Secondary | ICD-10-CM | POA: Diagnosis not present

## 2012-03-27 DIAGNOSIS — N2581 Secondary hyperparathyroidism of renal origin: Secondary | ICD-10-CM | POA: Diagnosis not present

## 2012-03-27 DIAGNOSIS — D631 Anemia in chronic kidney disease: Secondary | ICD-10-CM | POA: Diagnosis not present

## 2012-03-27 DIAGNOSIS — Z992 Dependence on renal dialysis: Secondary | ICD-10-CM | POA: Diagnosis not present

## 2012-03-28 DIAGNOSIS — D509 Iron deficiency anemia, unspecified: Secondary | ICD-10-CM | POA: Diagnosis not present

## 2012-03-28 DIAGNOSIS — N186 End stage renal disease: Secondary | ICD-10-CM | POA: Diagnosis not present

## 2012-03-28 DIAGNOSIS — Z992 Dependence on renal dialysis: Secondary | ICD-10-CM | POA: Diagnosis not present

## 2012-03-28 DIAGNOSIS — N2581 Secondary hyperparathyroidism of renal origin: Secondary | ICD-10-CM | POA: Diagnosis not present

## 2012-03-28 DIAGNOSIS — D631 Anemia in chronic kidney disease: Secondary | ICD-10-CM | POA: Diagnosis not present

## 2012-03-29 DIAGNOSIS — D509 Iron deficiency anemia, unspecified: Secondary | ICD-10-CM | POA: Diagnosis not present

## 2012-03-29 DIAGNOSIS — Z992 Dependence on renal dialysis: Secondary | ICD-10-CM | POA: Diagnosis not present

## 2012-03-29 DIAGNOSIS — D631 Anemia in chronic kidney disease: Secondary | ICD-10-CM | POA: Diagnosis not present

## 2012-03-29 DIAGNOSIS — N2581 Secondary hyperparathyroidism of renal origin: Secondary | ICD-10-CM | POA: Diagnosis not present

## 2012-03-29 DIAGNOSIS — N186 End stage renal disease: Secondary | ICD-10-CM | POA: Diagnosis not present

## 2012-03-30 DIAGNOSIS — N2581 Secondary hyperparathyroidism of renal origin: Secondary | ICD-10-CM | POA: Diagnosis not present

## 2012-03-30 DIAGNOSIS — D509 Iron deficiency anemia, unspecified: Secondary | ICD-10-CM | POA: Diagnosis not present

## 2012-03-30 DIAGNOSIS — N186 End stage renal disease: Secondary | ICD-10-CM | POA: Diagnosis not present

## 2012-03-30 DIAGNOSIS — D631 Anemia in chronic kidney disease: Secondary | ICD-10-CM | POA: Diagnosis not present

## 2012-03-30 DIAGNOSIS — Z992 Dependence on renal dialysis: Secondary | ICD-10-CM | POA: Diagnosis not present

## 2012-03-31 DIAGNOSIS — D509 Iron deficiency anemia, unspecified: Secondary | ICD-10-CM | POA: Diagnosis not present

## 2012-03-31 DIAGNOSIS — D631 Anemia in chronic kidney disease: Secondary | ICD-10-CM | POA: Diagnosis not present

## 2012-03-31 DIAGNOSIS — Z992 Dependence on renal dialysis: Secondary | ICD-10-CM | POA: Diagnosis not present

## 2012-03-31 DIAGNOSIS — N186 End stage renal disease: Secondary | ICD-10-CM | POA: Diagnosis not present

## 2012-03-31 DIAGNOSIS — N2581 Secondary hyperparathyroidism of renal origin: Secondary | ICD-10-CM | POA: Diagnosis not present

## 2012-04-01 DIAGNOSIS — Z992 Dependence on renal dialysis: Secondary | ICD-10-CM | POA: Diagnosis not present

## 2012-04-01 DIAGNOSIS — D631 Anemia in chronic kidney disease: Secondary | ICD-10-CM | POA: Diagnosis not present

## 2012-04-01 DIAGNOSIS — N2581 Secondary hyperparathyroidism of renal origin: Secondary | ICD-10-CM | POA: Diagnosis not present

## 2012-04-01 DIAGNOSIS — N186 End stage renal disease: Secondary | ICD-10-CM | POA: Diagnosis not present

## 2012-04-01 DIAGNOSIS — D509 Iron deficiency anemia, unspecified: Secondary | ICD-10-CM | POA: Diagnosis not present

## 2012-04-02 DIAGNOSIS — D631 Anemia in chronic kidney disease: Secondary | ICD-10-CM | POA: Diagnosis not present

## 2012-04-02 DIAGNOSIS — N186 End stage renal disease: Secondary | ICD-10-CM | POA: Diagnosis not present

## 2012-04-02 DIAGNOSIS — D509 Iron deficiency anemia, unspecified: Secondary | ICD-10-CM | POA: Diagnosis not present

## 2012-04-02 DIAGNOSIS — N2581 Secondary hyperparathyroidism of renal origin: Secondary | ICD-10-CM | POA: Diagnosis not present

## 2012-04-02 DIAGNOSIS — Z992 Dependence on renal dialysis: Secondary | ICD-10-CM | POA: Diagnosis not present

## 2012-04-03 DIAGNOSIS — D631 Anemia in chronic kidney disease: Secondary | ICD-10-CM | POA: Diagnosis not present

## 2012-04-03 DIAGNOSIS — D509 Iron deficiency anemia, unspecified: Secondary | ICD-10-CM | POA: Diagnosis not present

## 2012-04-03 DIAGNOSIS — Z992 Dependence on renal dialysis: Secondary | ICD-10-CM | POA: Diagnosis not present

## 2012-04-03 DIAGNOSIS — N186 End stage renal disease: Secondary | ICD-10-CM | POA: Diagnosis not present

## 2012-04-03 DIAGNOSIS — N2581 Secondary hyperparathyroidism of renal origin: Secondary | ICD-10-CM | POA: Diagnosis not present

## 2012-04-04 DIAGNOSIS — N186 End stage renal disease: Secondary | ICD-10-CM | POA: Diagnosis not present

## 2012-04-04 DIAGNOSIS — D509 Iron deficiency anemia, unspecified: Secondary | ICD-10-CM | POA: Diagnosis not present

## 2012-04-04 DIAGNOSIS — D631 Anemia in chronic kidney disease: Secondary | ICD-10-CM | POA: Diagnosis not present

## 2012-05-04 DIAGNOSIS — N186 End stage renal disease: Secondary | ICD-10-CM | POA: Diagnosis not present

## 2012-05-05 DIAGNOSIS — N2581 Secondary hyperparathyroidism of renal origin: Secondary | ICD-10-CM | POA: Diagnosis not present

## 2012-05-05 DIAGNOSIS — D509 Iron deficiency anemia, unspecified: Secondary | ICD-10-CM | POA: Diagnosis not present

## 2012-05-05 DIAGNOSIS — D539 Nutritional anemia, unspecified: Secondary | ICD-10-CM | POA: Diagnosis not present

## 2012-05-05 DIAGNOSIS — N259 Disorder resulting from impaired renal tubular function, unspecified: Secondary | ICD-10-CM | POA: Diagnosis not present

## 2012-05-05 DIAGNOSIS — R7309 Other abnormal glucose: Secondary | ICD-10-CM | POA: Diagnosis not present

## 2012-05-05 DIAGNOSIS — N186 End stage renal disease: Secondary | ICD-10-CM | POA: Diagnosis not present

## 2012-05-05 DIAGNOSIS — Z79899 Other long term (current) drug therapy: Secondary | ICD-10-CM | POA: Diagnosis not present

## 2012-05-05 DIAGNOSIS — E878 Other disorders of electrolyte and fluid balance, not elsewhere classified: Secondary | ICD-10-CM | POA: Diagnosis not present

## 2012-05-06 DIAGNOSIS — N186 End stage renal disease: Secondary | ICD-10-CM | POA: Diagnosis not present

## 2012-06-04 DIAGNOSIS — N186 End stage renal disease: Secondary | ICD-10-CM | POA: Diagnosis not present

## 2012-06-05 DIAGNOSIS — D509 Iron deficiency anemia, unspecified: Secondary | ICD-10-CM | POA: Diagnosis not present

## 2012-06-05 DIAGNOSIS — N186 End stage renal disease: Secondary | ICD-10-CM | POA: Diagnosis not present

## 2012-06-05 DIAGNOSIS — N2581 Secondary hyperparathyroidism of renal origin: Secondary | ICD-10-CM | POA: Diagnosis not present

## 2012-06-06 DIAGNOSIS — N2581 Secondary hyperparathyroidism of renal origin: Secondary | ICD-10-CM | POA: Diagnosis not present

## 2012-06-06 DIAGNOSIS — N186 End stage renal disease: Secondary | ICD-10-CM | POA: Diagnosis not present

## 2012-06-06 DIAGNOSIS — D509 Iron deficiency anemia, unspecified: Secondary | ICD-10-CM | POA: Diagnosis not present

## 2012-06-07 DIAGNOSIS — N2581 Secondary hyperparathyroidism of renal origin: Secondary | ICD-10-CM | POA: Diagnosis not present

## 2012-06-07 DIAGNOSIS — N186 End stage renal disease: Secondary | ICD-10-CM | POA: Diagnosis not present

## 2012-06-07 DIAGNOSIS — D509 Iron deficiency anemia, unspecified: Secondary | ICD-10-CM | POA: Diagnosis not present

## 2012-06-08 DIAGNOSIS — N186 End stage renal disease: Secondary | ICD-10-CM | POA: Diagnosis not present

## 2012-06-08 DIAGNOSIS — D509 Iron deficiency anemia, unspecified: Secondary | ICD-10-CM | POA: Diagnosis not present

## 2012-06-08 DIAGNOSIS — N2581 Secondary hyperparathyroidism of renal origin: Secondary | ICD-10-CM | POA: Diagnosis not present

## 2012-06-09 DIAGNOSIS — N186 End stage renal disease: Secondary | ICD-10-CM | POA: Diagnosis not present

## 2012-06-09 DIAGNOSIS — N2581 Secondary hyperparathyroidism of renal origin: Secondary | ICD-10-CM | POA: Diagnosis not present

## 2012-06-09 DIAGNOSIS — D509 Iron deficiency anemia, unspecified: Secondary | ICD-10-CM | POA: Diagnosis not present

## 2012-06-10 DIAGNOSIS — N2581 Secondary hyperparathyroidism of renal origin: Secondary | ICD-10-CM | POA: Diagnosis not present

## 2012-06-10 DIAGNOSIS — D509 Iron deficiency anemia, unspecified: Secondary | ICD-10-CM | POA: Diagnosis not present

## 2012-06-10 DIAGNOSIS — N186 End stage renal disease: Secondary | ICD-10-CM | POA: Diagnosis not present

## 2012-06-11 DIAGNOSIS — N186 End stage renal disease: Secondary | ICD-10-CM | POA: Diagnosis not present

## 2012-06-11 DIAGNOSIS — D509 Iron deficiency anemia, unspecified: Secondary | ICD-10-CM | POA: Diagnosis not present

## 2012-06-11 DIAGNOSIS — N2581 Secondary hyperparathyroidism of renal origin: Secondary | ICD-10-CM | POA: Diagnosis not present

## 2012-06-12 DIAGNOSIS — D509 Iron deficiency anemia, unspecified: Secondary | ICD-10-CM | POA: Diagnosis not present

## 2012-06-12 DIAGNOSIS — N186 End stage renal disease: Secondary | ICD-10-CM | POA: Diagnosis not present

## 2012-06-12 DIAGNOSIS — N2581 Secondary hyperparathyroidism of renal origin: Secondary | ICD-10-CM | POA: Diagnosis not present

## 2012-06-13 DIAGNOSIS — N186 End stage renal disease: Secondary | ICD-10-CM | POA: Diagnosis not present

## 2012-06-13 DIAGNOSIS — N2581 Secondary hyperparathyroidism of renal origin: Secondary | ICD-10-CM | POA: Diagnosis not present

## 2012-06-13 DIAGNOSIS — D509 Iron deficiency anemia, unspecified: Secondary | ICD-10-CM | POA: Diagnosis not present

## 2012-06-14 DIAGNOSIS — D509 Iron deficiency anemia, unspecified: Secondary | ICD-10-CM | POA: Diagnosis not present

## 2012-06-14 DIAGNOSIS — N2581 Secondary hyperparathyroidism of renal origin: Secondary | ICD-10-CM | POA: Diagnosis not present

## 2012-06-14 DIAGNOSIS — N186 End stage renal disease: Secondary | ICD-10-CM | POA: Diagnosis not present

## 2012-06-15 DIAGNOSIS — N186 End stage renal disease: Secondary | ICD-10-CM | POA: Diagnosis not present

## 2012-06-15 DIAGNOSIS — N2581 Secondary hyperparathyroidism of renal origin: Secondary | ICD-10-CM | POA: Diagnosis not present

## 2012-06-15 DIAGNOSIS — D509 Iron deficiency anemia, unspecified: Secondary | ICD-10-CM | POA: Diagnosis not present

## 2012-06-16 DIAGNOSIS — N186 End stage renal disease: Secondary | ICD-10-CM | POA: Diagnosis not present

## 2012-06-16 DIAGNOSIS — D509 Iron deficiency anemia, unspecified: Secondary | ICD-10-CM | POA: Diagnosis not present

## 2012-06-16 DIAGNOSIS — N2581 Secondary hyperparathyroidism of renal origin: Secondary | ICD-10-CM | POA: Diagnosis not present

## 2012-06-17 DIAGNOSIS — D509 Iron deficiency anemia, unspecified: Secondary | ICD-10-CM | POA: Diagnosis not present

## 2012-06-17 DIAGNOSIS — N2581 Secondary hyperparathyroidism of renal origin: Secondary | ICD-10-CM | POA: Diagnosis not present

## 2012-06-17 DIAGNOSIS — N186 End stage renal disease: Secondary | ICD-10-CM | POA: Diagnosis not present

## 2012-06-18 DIAGNOSIS — N186 End stage renal disease: Secondary | ICD-10-CM | POA: Diagnosis not present

## 2012-06-18 DIAGNOSIS — N2581 Secondary hyperparathyroidism of renal origin: Secondary | ICD-10-CM | POA: Diagnosis not present

## 2012-06-18 DIAGNOSIS — D509 Iron deficiency anemia, unspecified: Secondary | ICD-10-CM | POA: Diagnosis not present

## 2012-06-19 DIAGNOSIS — D631 Anemia in chronic kidney disease: Secondary | ICD-10-CM | POA: Diagnosis present

## 2012-06-19 DIAGNOSIS — Z940 Kidney transplant status: Secondary | ICD-10-CM | POA: Diagnosis not present

## 2012-06-19 DIAGNOSIS — R188 Other ascites: Secondary | ICD-10-CM | POA: Diagnosis not present

## 2012-06-19 DIAGNOSIS — J9819 Other pulmonary collapse: Secondary | ICD-10-CM | POA: Diagnosis not present

## 2012-06-19 DIAGNOSIS — I120 Hypertensive chronic kidney disease with stage 5 chronic kidney disease or end stage renal disease: Secondary | ICD-10-CM | POA: Diagnosis not present

## 2012-06-19 DIAGNOSIS — D509 Iron deficiency anemia, unspecified: Secondary | ICD-10-CM | POA: Diagnosis not present

## 2012-06-19 DIAGNOSIS — E875 Hyperkalemia: Secondary | ICD-10-CM | POA: Diagnosis not present

## 2012-06-19 DIAGNOSIS — D899 Disorder involving the immune mechanism, unspecified: Secondary | ICD-10-CM | POA: Diagnosis not present

## 2012-06-19 DIAGNOSIS — Z9225 Personal history of immunosupression therapy: Secondary | ICD-10-CM | POA: Diagnosis not present

## 2012-06-19 DIAGNOSIS — W19XXXA Unspecified fall, initial encounter: Secondary | ICD-10-CM | POA: Diagnosis not present

## 2012-06-19 DIAGNOSIS — I10 Essential (primary) hypertension: Secondary | ICD-10-CM | POA: Diagnosis not present

## 2012-06-19 DIAGNOSIS — N2581 Secondary hyperparathyroidism of renal origin: Secondary | ICD-10-CM | POA: Diagnosis not present

## 2012-06-19 DIAGNOSIS — K5909 Other constipation: Secondary | ICD-10-CM | POA: Diagnosis present

## 2012-06-19 DIAGNOSIS — N186 End stage renal disease: Secondary | ICD-10-CM | POA: Diagnosis not present

## 2012-06-19 DIAGNOSIS — Z452 Encounter for adjustment and management of vascular access device: Secondary | ICD-10-CM | POA: Diagnosis not present

## 2012-06-19 DIAGNOSIS — Z992 Dependence on renal dialysis: Secondary | ICD-10-CM | POA: Diagnosis not present

## 2012-06-19 DIAGNOSIS — Q602 Renal agenesis, unspecified: Secondary | ICD-10-CM | POA: Diagnosis not present

## 2012-06-19 HISTORY — DX: Kidney transplant status: Z94.0

## 2012-06-28 DIAGNOSIS — Z79899 Other long term (current) drug therapy: Secondary | ICD-10-CM | POA: Diagnosis not present

## 2012-06-28 DIAGNOSIS — D849 Immunodeficiency, unspecified: Secondary | ICD-10-CM | POA: Insufficient documentation

## 2012-06-28 DIAGNOSIS — Z48298 Encounter for aftercare following other organ transplant: Secondary | ICD-10-CM | POA: Diagnosis not present

## 2012-06-28 DIAGNOSIS — D899 Disorder involving the immune mechanism, unspecified: Secondary | ICD-10-CM | POA: Diagnosis not present

## 2012-06-28 DIAGNOSIS — Z940 Kidney transplant status: Secondary | ICD-10-CM | POA: Diagnosis not present

## 2012-06-28 DIAGNOSIS — Z5181 Encounter for therapeutic drug level monitoring: Secondary | ICD-10-CM | POA: Diagnosis not present

## 2012-07-04 DIAGNOSIS — Z5189 Encounter for other specified aftercare: Secondary | ICD-10-CM | POA: Diagnosis not present

## 2012-07-04 DIAGNOSIS — D649 Anemia, unspecified: Secondary | ICD-10-CM | POA: Diagnosis not present

## 2012-07-04 DIAGNOSIS — R600 Localized edema: Secondary | ICD-10-CM | POA: Insufficient documentation

## 2012-07-04 DIAGNOSIS — Z940 Kidney transplant status: Secondary | ICD-10-CM | POA: Diagnosis not present

## 2012-07-04 DIAGNOSIS — D899 Disorder involving the immune mechanism, unspecified: Secondary | ICD-10-CM | POA: Diagnosis not present

## 2012-07-04 DIAGNOSIS — R300 Dysuria: Secondary | ICD-10-CM | POA: Diagnosis not present

## 2012-07-04 DIAGNOSIS — R609 Edema, unspecified: Secondary | ICD-10-CM | POA: Diagnosis not present

## 2012-07-12 DIAGNOSIS — N2889 Other specified disorders of kidney and ureter: Secondary | ICD-10-CM | POA: Diagnosis not present

## 2012-07-12 DIAGNOSIS — N1330 Unspecified hydronephrosis: Secondary | ICD-10-CM | POA: Diagnosis not present

## 2012-07-12 DIAGNOSIS — D899 Disorder involving the immune mechanism, unspecified: Secondary | ICD-10-CM | POA: Diagnosis not present

## 2012-07-12 DIAGNOSIS — R935 Abnormal findings on diagnostic imaging of other abdominal regions, including retroperitoneum: Secondary | ICD-10-CM | POA: Diagnosis not present

## 2012-07-12 DIAGNOSIS — D649 Anemia, unspecified: Secondary | ICD-10-CM | POA: Diagnosis not present

## 2012-07-12 DIAGNOSIS — T8610 Unspecified complication of kidney transplant: Secondary | ICD-10-CM | POA: Diagnosis not present

## 2012-07-12 DIAGNOSIS — Z79899 Other long term (current) drug therapy: Secondary | ICD-10-CM | POA: Diagnosis not present

## 2012-07-12 DIAGNOSIS — I10 Essential (primary) hypertension: Secondary | ICD-10-CM | POA: Diagnosis not present

## 2012-07-12 DIAGNOSIS — Z940 Kidney transplant status: Secondary | ICD-10-CM | POA: Diagnosis not present

## 2012-07-12 DIAGNOSIS — Z9889 Other specified postprocedural states: Secondary | ICD-10-CM | POA: Diagnosis not present

## 2012-07-12 DIAGNOSIS — R188 Other ascites: Secondary | ICD-10-CM | POA: Diagnosis not present

## 2012-07-12 DIAGNOSIS — R609 Edema, unspecified: Secondary | ICD-10-CM | POA: Diagnosis not present

## 2012-07-13 DIAGNOSIS — IMO0002 Reserved for concepts with insufficient information to code with codable children: Secondary | ICD-10-CM | POA: Diagnosis not present

## 2012-07-13 DIAGNOSIS — Z79899 Other long term (current) drug therapy: Secondary | ICD-10-CM | POA: Diagnosis not present

## 2012-07-13 DIAGNOSIS — T8610 Unspecified complication of kidney transplant: Secondary | ICD-10-CM | POA: Diagnosis not present

## 2012-07-13 DIAGNOSIS — Z940 Kidney transplant status: Secondary | ICD-10-CM | POA: Diagnosis not present

## 2012-07-13 DIAGNOSIS — Z7982 Long term (current) use of aspirin: Secondary | ICD-10-CM | POA: Diagnosis not present

## 2012-07-13 DIAGNOSIS — Z5181 Encounter for therapeutic drug level monitoring: Secondary | ICD-10-CM | POA: Diagnosis not present

## 2012-07-13 DIAGNOSIS — N281 Cyst of kidney, acquired: Secondary | ICD-10-CM | POA: Diagnosis not present

## 2012-07-13 DIAGNOSIS — Z4682 Encounter for fitting and adjustment of non-vascular catheter: Secondary | ICD-10-CM | POA: Diagnosis not present

## 2012-07-13 DIAGNOSIS — K429 Umbilical hernia without obstruction or gangrene: Secondary | ICD-10-CM | POA: Diagnosis not present

## 2012-07-15 DIAGNOSIS — D899 Disorder involving the immune mechanism, unspecified: Secondary | ICD-10-CM | POA: Diagnosis not present

## 2012-07-15 DIAGNOSIS — Z940 Kidney transplant status: Secondary | ICD-10-CM | POA: Diagnosis not present

## 2012-07-15 DIAGNOSIS — I898 Other specified noninfective disorders of lymphatic vessels and lymph nodes: Secondary | ICD-10-CM | POA: Diagnosis not present

## 2012-07-19 DIAGNOSIS — Z5181 Encounter for therapeutic drug level monitoring: Secondary | ICD-10-CM | POA: Diagnosis not present

## 2012-07-19 DIAGNOSIS — Z79899 Other long term (current) drug therapy: Secondary | ICD-10-CM | POA: Diagnosis not present

## 2012-07-19 DIAGNOSIS — Z940 Kidney transplant status: Secondary | ICD-10-CM | POA: Diagnosis not present

## 2012-07-27 DIAGNOSIS — Z940 Kidney transplant status: Secondary | ICD-10-CM | POA: Diagnosis not present

## 2012-07-27 DIAGNOSIS — D899 Disorder involving the immune mechanism, unspecified: Secondary | ICD-10-CM | POA: Diagnosis not present

## 2012-08-02 DIAGNOSIS — N390 Urinary tract infection, site not specified: Secondary | ICD-10-CM | POA: Diagnosis not present

## 2012-08-02 DIAGNOSIS — B259 Cytomegaloviral disease, unspecified: Secondary | ICD-10-CM | POA: Diagnosis not present

## 2012-08-02 DIAGNOSIS — Z79899 Other long term (current) drug therapy: Secondary | ICD-10-CM | POA: Diagnosis not present

## 2012-08-02 DIAGNOSIS — Z940 Kidney transplant status: Secondary | ICD-10-CM | POA: Diagnosis not present

## 2012-08-02 DIAGNOSIS — N059 Unspecified nephritic syndrome with unspecified morphologic changes: Secondary | ICD-10-CM | POA: Diagnosis not present

## 2012-08-09 DIAGNOSIS — Z940 Kidney transplant status: Secondary | ICD-10-CM | POA: Diagnosis not present

## 2012-08-09 DIAGNOSIS — Z79899 Other long term (current) drug therapy: Secondary | ICD-10-CM | POA: Diagnosis not present

## 2012-08-10 DIAGNOSIS — I10 Essential (primary) hypertension: Secondary | ICD-10-CM | POA: Diagnosis not present

## 2012-08-10 DIAGNOSIS — L299 Pruritus, unspecified: Secondary | ICD-10-CM | POA: Diagnosis not present

## 2012-08-11 DIAGNOSIS — Z940 Kidney transplant status: Secondary | ICD-10-CM | POA: Diagnosis not present

## 2012-08-11 DIAGNOSIS — N135 Crossing vessel and stricture of ureter without hydronephrosis: Secondary | ICD-10-CM | POA: Insufficient documentation

## 2012-08-17 DIAGNOSIS — Z940 Kidney transplant status: Secondary | ICD-10-CM | POA: Diagnosis not present

## 2012-08-17 DIAGNOSIS — Z23 Encounter for immunization: Secondary | ICD-10-CM | POA: Diagnosis not present

## 2012-08-17 DIAGNOSIS — D899 Disorder involving the immune mechanism, unspecified: Secondary | ICD-10-CM | POA: Diagnosis not present

## 2012-09-02 DIAGNOSIS — Z79899 Other long term (current) drug therapy: Secondary | ICD-10-CM | POA: Diagnosis not present

## 2012-09-02 DIAGNOSIS — Z940 Kidney transplant status: Secondary | ICD-10-CM | POA: Diagnosis not present

## 2012-09-16 DIAGNOSIS — N179 Acute kidney failure, unspecified: Secondary | ICD-10-CM | POA: Diagnosis not present

## 2012-09-16 DIAGNOSIS — Z940 Kidney transplant status: Secondary | ICD-10-CM | POA: Diagnosis not present

## 2012-09-16 DIAGNOSIS — R404 Transient alteration of awareness: Secondary | ICD-10-CM | POA: Diagnosis not present

## 2012-09-16 DIAGNOSIS — N12 Tubulo-interstitial nephritis, not specified as acute or chronic: Secondary | ICD-10-CM | POA: Diagnosis not present

## 2012-09-16 DIAGNOSIS — Z79899 Other long term (current) drug therapy: Secondary | ICD-10-CM | POA: Diagnosis not present

## 2012-09-16 DIAGNOSIS — R0602 Shortness of breath: Secondary | ICD-10-CM | POA: Diagnosis not present

## 2012-09-16 DIAGNOSIS — N10 Acute tubulo-interstitial nephritis: Secondary | ICD-10-CM | POA: Diagnosis not present

## 2012-09-16 DIAGNOSIS — R000 Tachycardia, unspecified: Secondary | ICD-10-CM | POA: Diagnosis not present

## 2012-09-16 DIAGNOSIS — R42 Dizziness and giddiness: Secondary | ICD-10-CM | POA: Diagnosis not present

## 2012-09-16 DIAGNOSIS — I498 Other specified cardiac arrhythmias: Secondary | ICD-10-CM | POA: Diagnosis not present

## 2012-09-16 DIAGNOSIS — IMO0002 Reserved for concepts with insufficient information to code with codable children: Secondary | ICD-10-CM | POA: Diagnosis not present

## 2012-09-16 DIAGNOSIS — I10 Essential (primary) hypertension: Secondary | ICD-10-CM | POA: Diagnosis not present

## 2012-09-16 DIAGNOSIS — R509 Fever, unspecified: Secondary | ICD-10-CM | POA: Diagnosis not present

## 2012-09-17 DIAGNOSIS — N390 Urinary tract infection, site not specified: Secondary | ICD-10-CM | POA: Diagnosis present

## 2012-09-17 DIAGNOSIS — B259 Cytomegaloviral disease, unspecified: Secondary | ICD-10-CM | POA: Diagnosis not present

## 2012-09-17 DIAGNOSIS — Z940 Kidney transplant status: Secondary | ICD-10-CM | POA: Diagnosis not present

## 2012-09-17 DIAGNOSIS — E869 Volume depletion, unspecified: Secondary | ICD-10-CM | POA: Diagnosis present

## 2012-09-17 DIAGNOSIS — B961 Klebsiella pneumoniae [K. pneumoniae] as the cause of diseases classified elsewhere: Secondary | ICD-10-CM | POA: Diagnosis present

## 2012-09-17 DIAGNOSIS — R0609 Other forms of dyspnea: Secondary | ICD-10-CM | POA: Diagnosis not present

## 2012-09-17 DIAGNOSIS — R109 Unspecified abdominal pain: Secondary | ICD-10-CM | POA: Diagnosis not present

## 2012-09-17 DIAGNOSIS — R000 Tachycardia, unspecified: Secondary | ICD-10-CM | POA: Diagnosis not present

## 2012-09-17 DIAGNOSIS — R5381 Other malaise: Secondary | ICD-10-CM | POA: Diagnosis present

## 2012-09-17 DIAGNOSIS — T8610 Unspecified complication of kidney transplant: Secondary | ICD-10-CM | POA: Diagnosis not present

## 2012-09-17 DIAGNOSIS — N179 Acute kidney failure, unspecified: Secondary | ICD-10-CM | POA: Diagnosis not present

## 2012-09-17 DIAGNOSIS — R509 Fever, unspecified: Secondary | ICD-10-CM | POA: Diagnosis not present

## 2012-09-17 DIAGNOSIS — R51 Headache: Secondary | ICD-10-CM | POA: Diagnosis not present

## 2012-09-17 DIAGNOSIS — A419 Sepsis, unspecified organism: Secondary | ICD-10-CM | POA: Diagnosis not present

## 2012-09-17 DIAGNOSIS — N12 Tubulo-interstitial nephritis, not specified as acute or chronic: Secondary | ICD-10-CM | POA: Diagnosis not present

## 2012-09-17 DIAGNOSIS — R079 Chest pain, unspecified: Secondary | ICD-10-CM | POA: Diagnosis not present

## 2012-09-17 DIAGNOSIS — E785 Hyperlipidemia, unspecified: Secondary | ICD-10-CM | POA: Diagnosis present

## 2012-09-17 DIAGNOSIS — N10 Acute tubulo-interstitial nephritis: Secondary | ICD-10-CM | POA: Diagnosis not present

## 2012-09-17 DIAGNOSIS — D899 Disorder involving the immune mechanism, unspecified: Secondary | ICD-10-CM | POA: Diagnosis not present

## 2012-09-17 DIAGNOSIS — N1330 Unspecified hydronephrosis: Secondary | ICD-10-CM | POA: Diagnosis not present

## 2012-09-26 DIAGNOSIS — Z940 Kidney transplant status: Secondary | ICD-10-CM | POA: Diagnosis not present

## 2012-09-26 DIAGNOSIS — Z79899 Other long term (current) drug therapy: Secondary | ICD-10-CM | POA: Diagnosis not present

## 2012-10-11 DIAGNOSIS — Z940 Kidney transplant status: Secondary | ICD-10-CM | POA: Diagnosis not present

## 2012-10-11 DIAGNOSIS — I10 Essential (primary) hypertension: Secondary | ICD-10-CM | POA: Diagnosis not present

## 2012-10-11 DIAGNOSIS — E785 Hyperlipidemia, unspecified: Secondary | ICD-10-CM | POA: Diagnosis not present

## 2012-10-11 DIAGNOSIS — D649 Anemia, unspecified: Secondary | ICD-10-CM | POA: Diagnosis not present

## 2012-10-11 DIAGNOSIS — N2581 Secondary hyperparathyroidism of renal origin: Secondary | ICD-10-CM | POA: Diagnosis not present

## 2012-10-12 DIAGNOSIS — E559 Vitamin D deficiency, unspecified: Secondary | ICD-10-CM | POA: Diagnosis not present

## 2012-10-12 DIAGNOSIS — Z940 Kidney transplant status: Secondary | ICD-10-CM | POA: Diagnosis not present

## 2012-10-12 DIAGNOSIS — Z79899 Other long term (current) drug therapy: Secondary | ICD-10-CM | POA: Diagnosis not present

## 2012-10-12 DIAGNOSIS — B259 Cytomegaloviral disease, unspecified: Secondary | ICD-10-CM | POA: Diagnosis not present

## 2012-10-12 DIAGNOSIS — Z48298 Encounter for aftercare following other organ transplant: Secondary | ICD-10-CM | POA: Diagnosis not present

## 2012-11-28 DIAGNOSIS — M545 Low back pain, unspecified: Secondary | ICD-10-CM | POA: Diagnosis not present

## 2012-11-28 DIAGNOSIS — Z79899 Other long term (current) drug therapy: Secondary | ICD-10-CM | POA: Diagnosis not present

## 2012-11-28 DIAGNOSIS — IMO0002 Reserved for concepts with insufficient information to code with codable children: Secondary | ICD-10-CM | POA: Diagnosis not present

## 2012-11-28 DIAGNOSIS — I10 Essential (primary) hypertension: Secondary | ICD-10-CM | POA: Diagnosis not present

## 2012-11-28 DIAGNOSIS — M62838 Other muscle spasm: Secondary | ICD-10-CM | POA: Diagnosis not present

## 2012-12-02 DIAGNOSIS — I10 Essential (primary) hypertension: Secondary | ICD-10-CM | POA: Diagnosis not present

## 2012-12-02 DIAGNOSIS — Z940 Kidney transplant status: Secondary | ICD-10-CM | POA: Diagnosis not present

## 2012-12-02 DIAGNOSIS — N390 Urinary tract infection, site not specified: Secondary | ICD-10-CM | POA: Diagnosis not present

## 2012-12-03 DIAGNOSIS — D649 Anemia, unspecified: Secondary | ICD-10-CM | POA: Diagnosis not present

## 2012-12-05 DIAGNOSIS — Z940 Kidney transplant status: Secondary | ICD-10-CM | POA: Diagnosis not present

## 2012-12-05 DIAGNOSIS — I10 Essential (primary) hypertension: Secondary | ICD-10-CM | POA: Diagnosis not present

## 2012-12-05 DIAGNOSIS — D649 Anemia, unspecified: Secondary | ICD-10-CM | POA: Diagnosis not present

## 2012-12-05 DIAGNOSIS — E213 Hyperparathyroidism, unspecified: Secondary | ICD-10-CM | POA: Diagnosis not present

## 2012-12-15 DIAGNOSIS — Z940 Kidney transplant status: Secondary | ICD-10-CM | POA: Diagnosis not present

## 2012-12-28 DIAGNOSIS — D509 Iron deficiency anemia, unspecified: Secondary | ICD-10-CM | POA: Diagnosis not present

## 2012-12-28 DIAGNOSIS — Z940 Kidney transplant status: Secondary | ICD-10-CM | POA: Diagnosis not present

## 2013-01-03 DIAGNOSIS — I120 Hypertensive chronic kidney disease with stage 5 chronic kidney disease or end stage renal disease: Secondary | ICD-10-CM | POA: Diagnosis not present

## 2013-01-03 DIAGNOSIS — D649 Anemia, unspecified: Secondary | ICD-10-CM | POA: Diagnosis not present

## 2013-01-03 DIAGNOSIS — Z940 Kidney transplant status: Secondary | ICD-10-CM | POA: Diagnosis not present

## 2013-01-09 DIAGNOSIS — Z940 Kidney transplant status: Secondary | ICD-10-CM | POA: Diagnosis not present

## 2013-01-11 DIAGNOSIS — N183 Chronic kidney disease, stage 3 unspecified: Secondary | ICD-10-CM | POA: Diagnosis not present

## 2013-01-11 DIAGNOSIS — N390 Urinary tract infection, site not specified: Secondary | ICD-10-CM | POA: Diagnosis not present

## 2013-01-11 DIAGNOSIS — Z48298 Encounter for aftercare following other organ transplant: Secondary | ICD-10-CM | POA: Diagnosis not present

## 2013-01-11 DIAGNOSIS — R944 Abnormal results of kidney function studies: Secondary | ICD-10-CM | POA: Diagnosis not present

## 2013-01-11 DIAGNOSIS — R609 Edema, unspecified: Secondary | ICD-10-CM | POA: Diagnosis not present

## 2013-01-11 DIAGNOSIS — R799 Abnormal finding of blood chemistry, unspecified: Secondary | ICD-10-CM | POA: Diagnosis not present

## 2013-01-11 DIAGNOSIS — N189 Chronic kidney disease, unspecified: Secondary | ICD-10-CM | POA: Diagnosis not present

## 2013-01-11 DIAGNOSIS — I10 Essential (primary) hypertension: Secondary | ICD-10-CM | POA: Diagnosis not present

## 2013-01-11 DIAGNOSIS — Z940 Kidney transplant status: Secondary | ICD-10-CM | POA: Diagnosis not present

## 2013-01-11 DIAGNOSIS — D899 Disorder involving the immune mechanism, unspecified: Secondary | ICD-10-CM | POA: Diagnosis not present

## 2013-01-11 DIAGNOSIS — D689 Coagulation defect, unspecified: Secondary | ICD-10-CM | POA: Diagnosis not present

## 2013-01-11 DIAGNOSIS — N1330 Unspecified hydronephrosis: Secondary | ICD-10-CM | POA: Diagnosis not present

## 2013-01-11 DIAGNOSIS — R5381 Other malaise: Secondary | ICD-10-CM | POA: Diagnosis not present

## 2013-01-17 DIAGNOSIS — I120 Hypertensive chronic kidney disease with stage 5 chronic kidney disease or end stage renal disease: Secondary | ICD-10-CM | POA: Diagnosis not present

## 2013-01-17 DIAGNOSIS — D649 Anemia, unspecified: Secondary | ICD-10-CM | POA: Diagnosis not present

## 2013-01-17 DIAGNOSIS — N2581 Secondary hyperparathyroidism of renal origin: Secondary | ICD-10-CM | POA: Diagnosis not present

## 2013-01-17 DIAGNOSIS — Z940 Kidney transplant status: Secondary | ICD-10-CM | POA: Diagnosis not present

## 2013-01-19 DIAGNOSIS — I10 Essential (primary) hypertension: Secondary | ICD-10-CM | POA: Diagnosis not present

## 2013-01-19 DIAGNOSIS — E213 Hyperparathyroidism, unspecified: Secondary | ICD-10-CM | POA: Diagnosis not present

## 2013-01-19 DIAGNOSIS — Z940 Kidney transplant status: Secondary | ICD-10-CM | POA: Diagnosis not present

## 2013-01-19 DIAGNOSIS — D649 Anemia, unspecified: Secondary | ICD-10-CM | POA: Diagnosis not present

## 2013-01-25 DIAGNOSIS — Z940 Kidney transplant status: Secondary | ICD-10-CM | POA: Diagnosis not present

## 2013-01-25 DIAGNOSIS — D899 Disorder involving the immune mechanism, unspecified: Secondary | ICD-10-CM | POA: Diagnosis not present

## 2013-02-06 DIAGNOSIS — E785 Hyperlipidemia, unspecified: Secondary | ICD-10-CM | POA: Diagnosis not present

## 2013-02-06 DIAGNOSIS — I120 Hypertensive chronic kidney disease with stage 5 chronic kidney disease or end stage renal disease: Secondary | ICD-10-CM | POA: Diagnosis not present

## 2013-02-06 DIAGNOSIS — Z940 Kidney transplant status: Secondary | ICD-10-CM | POA: Diagnosis not present

## 2013-02-06 DIAGNOSIS — D649 Anemia, unspecified: Secondary | ICD-10-CM | POA: Diagnosis not present

## 2013-02-20 DIAGNOSIS — I10 Essential (primary) hypertension: Secondary | ICD-10-CM | POA: Diagnosis not present

## 2013-02-20 DIAGNOSIS — D649 Anemia, unspecified: Secondary | ICD-10-CM | POA: Diagnosis not present

## 2013-02-20 DIAGNOSIS — Z940 Kidney transplant status: Secondary | ICD-10-CM | POA: Diagnosis not present

## 2013-02-20 DIAGNOSIS — E785 Hyperlipidemia, unspecified: Secondary | ICD-10-CM | POA: Diagnosis not present

## 2013-04-05 DIAGNOSIS — E213 Hyperparathyroidism, unspecified: Secondary | ICD-10-CM | POA: Diagnosis not present

## 2013-04-05 DIAGNOSIS — Z940 Kidney transplant status: Secondary | ICD-10-CM | POA: Diagnosis not present

## 2013-04-06 DIAGNOSIS — D649 Anemia, unspecified: Secondary | ICD-10-CM | POA: Diagnosis not present

## 2013-04-06 DIAGNOSIS — D509 Iron deficiency anemia, unspecified: Secondary | ICD-10-CM | POA: Diagnosis not present

## 2013-04-11 DIAGNOSIS — K2100 Gastro-esophageal reflux disease with esophagitis, without bleeding: Secondary | ICD-10-CM | POA: Diagnosis not present

## 2013-04-11 DIAGNOSIS — Z940 Kidney transplant status: Secondary | ICD-10-CM | POA: Diagnosis not present

## 2013-04-11 DIAGNOSIS — I10 Essential (primary) hypertension: Secondary | ICD-10-CM | POA: Diagnosis not present

## 2013-04-11 DIAGNOSIS — D649 Anemia, unspecified: Secondary | ICD-10-CM | POA: Diagnosis not present

## 2013-04-26 DIAGNOSIS — Z940 Kidney transplant status: Secondary | ICD-10-CM | POA: Diagnosis not present

## 2013-04-26 DIAGNOSIS — D899 Disorder involving the immune mechanism, unspecified: Secondary | ICD-10-CM | POA: Diagnosis not present

## 2013-04-26 DIAGNOSIS — I10 Essential (primary) hypertension: Secondary | ICD-10-CM | POA: Diagnosis not present

## 2013-05-17 DIAGNOSIS — Z940 Kidney transplant status: Secondary | ICD-10-CM | POA: Diagnosis not present

## 2013-05-17 DIAGNOSIS — Z79899 Other long term (current) drug therapy: Secondary | ICD-10-CM | POA: Diagnosis not present

## 2013-05-30 DIAGNOSIS — N2581 Secondary hyperparathyroidism of renal origin: Secondary | ICD-10-CM | POA: Diagnosis not present

## 2013-05-30 DIAGNOSIS — I10 Essential (primary) hypertension: Secondary | ICD-10-CM | POA: Diagnosis not present

## 2013-05-30 DIAGNOSIS — Z940 Kidney transplant status: Secondary | ICD-10-CM | POA: Diagnosis not present

## 2013-05-30 DIAGNOSIS — Z79899 Other long term (current) drug therapy: Secondary | ICD-10-CM | POA: Diagnosis not present

## 2013-05-30 DIAGNOSIS — E785 Hyperlipidemia, unspecified: Secondary | ICD-10-CM | POA: Diagnosis not present

## 2013-05-30 DIAGNOSIS — D649 Anemia, unspecified: Secondary | ICD-10-CM | POA: Diagnosis not present

## 2013-06-02 DIAGNOSIS — Z940 Kidney transplant status: Secondary | ICD-10-CM | POA: Diagnosis not present

## 2013-06-12 ENCOUNTER — Other Ambulatory Visit (HOSPITAL_COMMUNITY): Payer: Self-pay

## 2013-06-12 ENCOUNTER — Encounter (HOSPITAL_COMMUNITY): Payer: Medicare Other

## 2013-06-13 ENCOUNTER — Encounter (HOSPITAL_COMMUNITY)
Admission: RE | Admit: 2013-06-13 | Discharge: 2013-06-13 | Disposition: A | Discharge status: Home / Self Care | Payer: Medicare Other | Source: Ambulatory Visit | Attending: Nephrology | Admitting: Nephrology

## 2013-06-13 DIAGNOSIS — D649 Anemia, unspecified: Secondary | ICD-10-CM | POA: Insufficient documentation

## 2013-06-13 MED ORDER — SODIUM CHLORIDE 0.9 % IV SOLN
INTRAVENOUS | Status: DC
  Administered 2013-06-13: 10:00:00 via INTRAVENOUS

## 2013-06-13 MED ORDER — FERUMOXYTOL INJECTION 510 MG/17 ML
510.0000 mg | Freq: Once | INTRAVENOUS | Status: AC
  Administered 2013-06-13: 510 mg via INTRAVENOUS
  Filled 2013-06-13: qty 17

## 2013-06-20 DIAGNOSIS — Z940 Kidney transplant status: Secondary | ICD-10-CM | POA: Diagnosis not present

## 2013-06-20 DIAGNOSIS — Z79899 Other long term (current) drug therapy: Secondary | ICD-10-CM | POA: Diagnosis not present

## 2013-06-28 ENCOUNTER — Inpatient Hospital Stay (HOSPITAL_COMMUNITY)
Admission: AD | Admit: 2013-06-28 | Discharge: 2013-06-30 | DRG: 698 | Disposition: A | Discharge status: Home / Self Care | Payer: Medicare Other | Source: Other Acute Inpatient Hospital | Attending: Internal Medicine | Admitting: Internal Medicine

## 2013-06-28 ENCOUNTER — Encounter (HOSPITAL_COMMUNITY): Payer: Self-pay | Admitting: *Deleted

## 2013-06-28 ENCOUNTER — Telehealth: Payer: Self-pay | Admitting: Family Medicine

## 2013-06-28 DIAGNOSIS — N183 Chronic kidney disease, stage 3 unspecified: Secondary | ICD-10-CM | POA: Diagnosis not present

## 2013-06-28 DIAGNOSIS — R5381 Other malaise: Secondary | ICD-10-CM | POA: Diagnosis present

## 2013-06-28 DIAGNOSIS — N179 Acute kidney failure, unspecified: Secondary | ICD-10-CM | POA: Diagnosis present

## 2013-06-28 DIAGNOSIS — D631 Anemia in chronic kidney disease: Secondary | ICD-10-CM | POA: Diagnosis present

## 2013-06-28 DIAGNOSIS — K219 Gastro-esophageal reflux disease without esophagitis: Secondary | ICD-10-CM | POA: Diagnosis not present

## 2013-06-28 DIAGNOSIS — N189 Chronic kidney disease, unspecified: Secondary | ICD-10-CM | POA: Diagnosis not present

## 2013-06-28 DIAGNOSIS — Z992 Dependence on renal dialysis: Secondary | ICD-10-CM

## 2013-06-28 DIAGNOSIS — N186 End stage renal disease: Secondary | ICD-10-CM | POA: Diagnosis not present

## 2013-06-28 DIAGNOSIS — N10 Acute tubulo-interstitial nephritis: Secondary | ICD-10-CM | POA: Diagnosis not present

## 2013-06-28 DIAGNOSIS — R791 Abnormal coagulation profile: Secondary | ICD-10-CM | POA: Diagnosis present

## 2013-06-28 DIAGNOSIS — E213 Hyperparathyroidism, unspecified: Secondary | ICD-10-CM | POA: Diagnosis not present

## 2013-06-28 DIAGNOSIS — Z87891 Personal history of nicotine dependence: Secondary | ICD-10-CM

## 2013-06-28 DIAGNOSIS — T8610 Unspecified complication of kidney transplant: Secondary | ICD-10-CM | POA: Diagnosis present

## 2013-06-28 DIAGNOSIS — N039 Chronic nephritic syndrome with unspecified morphologic changes: Secondary | ICD-10-CM | POA: Diagnosis not present

## 2013-06-28 DIAGNOSIS — R6883 Chills (without fever): Secondary | ICD-10-CM | POA: Diagnosis not present

## 2013-06-28 DIAGNOSIS — I129 Hypertensive chronic kidney disease with stage 1 through stage 4 chronic kidney disease, or unspecified chronic kidney disease: Secondary | ICD-10-CM | POA: Diagnosis not present

## 2013-06-28 DIAGNOSIS — N269 Renal sclerosis, unspecified: Secondary | ICD-10-CM | POA: Diagnosis not present

## 2013-06-28 DIAGNOSIS — Y830 Surgical operation with transplant of whole organ as the cause of abnormal reaction of the patient, or of later complication, without mention of misadventure at the time of the procedure: Secondary | ICD-10-CM | POA: Diagnosis present

## 2013-06-28 DIAGNOSIS — E876 Hypokalemia: Secondary | ICD-10-CM | POA: Diagnosis present

## 2013-06-28 DIAGNOSIS — N390 Urinary tract infection, site not specified: Secondary | ICD-10-CM | POA: Diagnosis not present

## 2013-06-28 DIAGNOSIS — D899 Disorder involving the immune mechanism, unspecified: Secondary | ICD-10-CM | POA: Diagnosis not present

## 2013-06-28 DIAGNOSIS — N2581 Secondary hyperparathyroidism of renal origin: Secondary | ICD-10-CM | POA: Diagnosis present

## 2013-06-28 DIAGNOSIS — Z7901 Long term (current) use of anticoagulants: Secondary | ICD-10-CM | POA: Diagnosis not present

## 2013-06-28 DIAGNOSIS — R112 Nausea with vomiting, unspecified: Secondary | ICD-10-CM | POA: Diagnosis present

## 2013-06-28 DIAGNOSIS — I120 Hypertensive chronic kidney disease with stage 5 chronic kidney disease or end stage renal disease: Secondary | ICD-10-CM | POA: Diagnosis not present

## 2013-06-28 DIAGNOSIS — Z79899 Other long term (current) drug therapy: Secondary | ICD-10-CM | POA: Diagnosis not present

## 2013-06-28 DIAGNOSIS — IMO0002 Reserved for concepts with insufficient information to code with codable children: Secondary | ICD-10-CM | POA: Diagnosis not present

## 2013-06-28 DIAGNOSIS — Z940 Kidney transplant status: Secondary | ICD-10-CM

## 2013-06-28 DIAGNOSIS — R6889 Other general symptoms and signs: Secondary | ICD-10-CM | POA: Diagnosis not present

## 2013-06-28 DIAGNOSIS — Q602 Renal agenesis, unspecified: Secondary | ICD-10-CM

## 2013-06-28 DIAGNOSIS — R509 Fever, unspecified: Secondary | ICD-10-CM | POA: Diagnosis not present

## 2013-06-28 DIAGNOSIS — R5383 Other fatigue: Secondary | ICD-10-CM | POA: Diagnosis present

## 2013-06-28 DIAGNOSIS — A419 Sepsis, unspecified organism: Secondary | ICD-10-CM | POA: Diagnosis not present

## 2013-06-28 MED ORDER — GI COCKTAIL ~~LOC~~
30.0000 mL | Freq: Three times a day (TID) | ORAL | Status: DC | PRN
  Filled 2013-06-28: qty 30

## 2013-06-28 MED ORDER — ACETAMINOPHEN 325 MG PO TABS: 650.0000 mg | ORAL_TABLET | Freq: Four times a day (QID) | ORAL | Status: DC | PRN

## 2013-06-28 MED ORDER — HYDROCODONE-ACETAMINOPHEN 10-325 MG PO TABS
2.0000 | ORAL_TABLET | ORAL | Status: DC | PRN
  Administered 2013-06-29: 2 via ORAL
  Filled 2013-06-28: qty 2

## 2013-06-28 MED ORDER — ONDANSETRON HCL 4 MG/2ML IJ SOLN: 4.0000 mg | Freq: Four times a day (QID) | INTRAMUSCULAR | Status: DC | PRN

## 2013-06-28 MED ORDER — SODIUM CHLORIDE 0.9 % IV SOLN
INTRAVENOUS | Status: DC
  Administered 2013-06-28 – 2013-06-29 (×3): via INTRAVENOUS

## 2013-06-28 MED ORDER — MORPHINE SULFATE 2 MG/ML IJ SOLN
2.0000 mg | INTRAMUSCULAR | Status: DC | PRN
  Administered 2013-06-28 – 2013-06-30 (×6): 2 mg via INTRAVENOUS
  Filled 2013-06-28 (×6): qty 1

## 2013-06-28 MED ORDER — PNEUMOCOCCAL VAC POLYVALENT 25 MCG/0.5ML IJ INJ
0.5000 mL | INJECTION | INTRAMUSCULAR | Status: AC
  Administered 2013-06-30: 0.5 mL via INTRAMUSCULAR
  Filled 2013-06-28: qty 0.5

## 2013-06-28 MED ORDER — PANTOPRAZOLE SODIUM 40 MG IV SOLR: 40.0000 mg | Freq: Two times a day (BID) | INTRAVENOUS | Status: DC

## 2013-06-28 MED ORDER — PIPERACILLIN-TAZOBACTAM 3.375 G IVPB: 3.3750 g | Freq: Three times a day (TID) | INTRAVENOUS | Status: DC

## 2013-06-28 MED ORDER — ONDANSETRON HCL 4 MG PO TABS: 4.0000 mg | ORAL_TABLET | Freq: Four times a day (QID) | ORAL | Status: DC | PRN

## 2013-06-28 MED ORDER — ACETAMINOPHEN 650 MG RE SUPP: 650.0000 mg | Freq: Four times a day (QID) | RECTAL | Status: DC | PRN

## 2013-06-28 MED ORDER — INFLUENZA VAC SPLIT QUAD 0.5 ML IM SUSP
0.5000 mL | INTRAMUSCULAR | Status: AC
  Administered 2013-06-30: 0.5 mL via INTRAMUSCULAR
  Filled 2013-06-28: qty 0.5

## 2013-06-28 MED ORDER — SEVELAMER CARBONATE 2.4 G PO PACK
2.4000 g | PACK | Freq: Three times a day (TID) | ORAL | Status: DC
  Filled 2013-06-28 (×4): qty 1

## 2013-06-28 MED ORDER — HYDROCODONE-ACETAMINOPHEN 5-325 MG PO TABS: 1.0000 | ORAL_TABLET | ORAL | Status: DC | PRN

## 2013-06-28 MED ORDER — HEPARIN SODIUM (PORCINE) 5000 UNIT/ML IJ SOLN
5000.0000 [IU] | Freq: Three times a day (TID) | INTRAMUSCULAR | Status: DC
  Administered 2013-06-29 – 2013-06-30 (×3): 5000 [IU] via SUBCUTANEOUS
  Filled 2013-06-28 (×6): qty 1

## 2013-06-28 MED ORDER — MENTHOL 3 MG MT LOZG
1.0000 | LOZENGE | OROMUCOSAL | Status: DC | PRN
  Filled 2013-06-28: qty 9

## 2013-06-28 MED ORDER — BIOTENE DRY MOUTH MT LIQD
15.0000 mL | Freq: Two times a day (BID) | OROMUCOSAL | Status: DC
  Administered 2013-06-29 – 2013-06-30 (×3): 15 mL via OROMUCOSAL

## 2013-06-28 MED ORDER — PANTOPRAZOLE SODIUM 40 MG PO TBEC
40.0000 mg | DELAYED_RELEASE_TABLET | Freq: Two times a day (BID) | ORAL | Status: DC
  Administered 2013-06-29 (×3): 40 mg via ORAL
  Filled 2013-06-28 (×3): qty 1

## 2013-06-28 NOTE — Progress Notes (Signed)
ANTIBIOTIC CONSULT NOTE - INITIAL  Pharmacy Consult for Zosyn Indication: UTI  Allergies  Allergen Reactions  . Nickel Itching and Rash    Only where touched    Patient Measurements: Height: 5' 9.5" (176.5 cm) Weight: 204 lb 6.4 oz (92.715 kg) IBW/kg (Calculated) : 67.35  Vital Signs: Temp: 99.6 F (37.6 C) (09/24 2224) Temp src: Oral (09/24 2224) BP: 112/74 mmHg (09/24 2224) Pulse Rate: 101 (09/24 2224) Intake/Output from previous day:   Intake/Output from this shift:    Labs (at [facility]): WBC 11.6 Hgb 8.8 Hct 28.1 Plt 221  SCr 1.96    No results found for this basename: WBC, HGB, PLT, LABCREA, CREATININE,  in the last 72 hours Estimated Creatinine Clearance: 8.2 ml/min (by C-G formula based on Cr of 11.79). No results found for this basename: VANCOTROUGH, VANCOPEAK, VANCORANDOM, GENTTROUGH, GENTPEAK, GENTRANDOM, TOBRATROUGH, TOBRAPEAK, TOBRARND, AMIKACINPEAK, AMIKACINTROU, AMIKACIN,  in the last 72 hours   Microbiology: No results found for this or any previous visit (from the past 720 hour(s)).  Medical History: Past Medical History  Diagnosis Date  . Hypertension   . Poor appetite   . 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 GERD (gastroesophageal reflux disease) Dec. 2010    ulcerative esophagitis per EGD   . Secondary hyperparathyroidism     s/p parathyroidectomy with autotransplantation to left arm per Dr. Baker Pierini on 08/04/11  . Streptococcal peritonitis  Sept. 2012    Medications:  Norvasc  Protonix  Prograf  Prednisone  Calcitriol  Diltiazem  Myfortic    Assessment: 32 yo female with fever/chills, poosible UTI, for empiric antibiotics  Plan:  Zosyn 3.375 g IV q8h   Caryl Pina 06/28/2013,11:39 PM

## 2013-06-29 DIAGNOSIS — E876 Hypokalemia: Secondary | ICD-10-CM

## 2013-06-29 DIAGNOSIS — N183 Chronic kidney disease, stage 3 unspecified: Secondary | ICD-10-CM

## 2013-06-29 DIAGNOSIS — N10 Acute tubulo-interstitial nephritis: Secondary | ICD-10-CM | POA: Diagnosis present

## 2013-06-29 DIAGNOSIS — K219 Gastro-esophageal reflux disease without esophagitis: Secondary | ICD-10-CM

## 2013-06-29 DIAGNOSIS — Z940 Kidney transplant status: Secondary | ICD-10-CM

## 2013-06-29 LAB — CBC WITH DIFFERENTIAL/PLATELET
Basophils Absolute: 0 10*3/uL (ref 0.0–0.1)
Eosinophils Absolute: 0 10*3/uL (ref 0.0–0.7)
Eosinophils Relative: 0 % (ref 0–5)
Hemoglobin: 8.3 g/dL — ABNORMAL LOW (ref 12.0–15.0)
Lymphocytes Relative: 7 % — ABNORMAL LOW (ref 12–46)
Lymphocytes Relative: 7 % — ABNORMAL LOW (ref 12–46)
Lymphs Abs: 0.7 10*3/uL (ref 0.7–4.0)
MCH: 24.8 pg — ABNORMAL LOW (ref 26.0–34.0)
MCH: 24.1 pg — ABNORMAL LOW (ref 26.0–34.0)
MCV: 76.4 fL — ABNORMAL LOW (ref 78.0–100.0)
Monocytes Absolute: 1.1 10*3/uL — ABNORMAL HIGH (ref 0.1–1.0)
Monocytes Relative: 11 % (ref 3–12)
Neutro Abs: 9.3 10*3/uL — ABNORMAL HIGH (ref 1.7–7.7)
Neutrophils Relative %: 82 % — ABNORMAL HIGH (ref 43–77)
Platelets: 191 10*3/uL (ref 150–400)
Platelets: 229 10*3/uL (ref 150–400)
RBC: 3.22 MIL/uL — ABNORMAL LOW (ref 3.87–5.11)
RDW: 15.1 % (ref 11.5–15.5)
WBC: 11.9 10*3/uL — ABNORMAL HIGH (ref 4.0–10.5)

## 2013-06-29 LAB — URINALYSIS, ROUTINE W REFLEX MICROSCOPIC
Bilirubin Urine: NEGATIVE
Ketones, ur: NEGATIVE mg/dL
Nitrite: POSITIVE — AB
Protein, ur: 100 mg/dL — AB
Urobilinogen, UA: 0.2 mg/dL (ref 0.0–1.0)

## 2013-06-29 LAB — COMPREHENSIVE METABOLIC PANEL
ALT: 12 U/L (ref 0–35)
AST: 19 U/L (ref 0–37)
AST: 19 U/L (ref 0–37)
Albumin: 3 g/dL — ABNORMAL LOW (ref 3.5–5.2)
Albumin: 3.2 g/dL — ABNORMAL LOW (ref 3.5–5.2)
Alkaline Phosphatase: 47 U/L (ref 39–117)
Alkaline Phosphatase: 60 U/L (ref 39–117)
BUN: 16 mg/dL (ref 6–23)
BUN: 16 mg/dL (ref 6–23)
Chloride: 108 mEq/L (ref 96–112)
Chloride: 103 mEq/L (ref 96–112)
Creatinine, Ser: 2.07 mg/dL — ABNORMAL HIGH (ref 0.50–1.10)
Glucose, Bld: 122 mg/dL — ABNORMAL HIGH (ref 70–99)
Potassium: 3.4 mEq/L — ABNORMAL LOW (ref 3.5–5.1)
Potassium: 3.4 mEq/L — ABNORMAL LOW (ref 3.5–5.1)
Sodium: 142 mEq/L (ref 135–145)
Sodium: 138 mEq/L (ref 135–145)
Total Bilirubin: 0.3 mg/dL (ref 0.3–1.2)
Total Bilirubin: 0.2 mg/dL — ABNORMAL LOW (ref 0.3–1.2)
Total Protein: 6.3 g/dL (ref 6.0–8.3)

## 2013-06-29 LAB — GLUCOSE, CAPILLARY: Glucose-Capillary: 116 mg/dL — ABNORMAL HIGH (ref 70–99)

## 2013-06-29 LAB — URINE MICROSCOPIC-ADD ON

## 2013-06-29 LAB — RETICULOCYTES
RBC.: 3.22 MIL/uL — ABNORMAL LOW (ref 3.87–5.11)
Retic Count, Absolute: 38.6 10*3/uL (ref 19.0–186.0)
Retic Ct Pct: 1.2 % (ref 0.4–3.1)

## 2013-06-29 LAB — DIC (DISSEMINATED INTRAVASCULAR COAGULATION)PANEL
D-Dimer, Quant: 2.11 ug/mL-FEU — ABNORMAL HIGH (ref 0.00–0.48)
Prothrombin Time: 19.5 seconds — ABNORMAL HIGH (ref 11.6–15.2)
Smear Review: NONE SEEN

## 2013-06-29 LAB — TSH: TSH: 1.863 u[IU]/mL (ref 0.350–4.500)

## 2013-06-29 LAB — PROTIME-INR: Prothrombin Time: 19.2 seconds — ABNORMAL HIGH (ref 11.6–15.2)

## 2013-06-29 LAB — CLOSTRIDIUM DIFFICILE BY PCR: Toxigenic C. Difficile by PCR: NEGATIVE

## 2013-06-29 MED ORDER — TACROLIMUS 1 MG PO CAPS
5.0000 mg | ORAL_CAPSULE | Freq: Two times a day (BID) | ORAL | Status: DC
  Administered 2013-06-29 – 2013-06-30 (×3): 5 mg via ORAL
  Filled 2013-06-29 (×4): qty 5

## 2013-06-29 MED ORDER — MYCOPHENOLATE MOFETIL 250 MG PO CAPS
1000.0000 mg | ORAL_CAPSULE | Freq: Two times a day (BID) | ORAL | Status: DC
  Administered 2013-06-29 – 2013-06-30 (×3): 1000 mg via ORAL
  Filled 2013-06-29 (×4): qty 4

## 2013-06-29 MED ORDER — PREDNISONE 5 MG PO TABS
5.0000 mg | ORAL_TABLET | Freq: Every day | ORAL | Status: DC
  Administered 2013-06-30: 5 mg via ORAL
  Filled 2013-06-29 (×2): qty 1

## 2013-06-29 MED ORDER — CALCIUM CARBONATE ANTACID 500 MG PO CHEW
3.0000 | CHEWABLE_TABLET | Freq: Three times a day (TID) | ORAL | Status: DC
  Administered 2013-06-29 – 2013-06-30 (×3): 600 mg via ORAL
  Filled 2013-06-29 (×5): qty 3

## 2013-06-29 MED ORDER — PIPERACILLIN-TAZOBACTAM 3.375 G IVPB
3.3750 g | Freq: Three times a day (TID) | INTRAVENOUS | Status: DC
  Administered 2013-06-29 – 2013-06-30 (×3): 3.375 g via INTRAVENOUS
  Filled 2013-06-29 (×5): qty 50

## 2013-06-29 MED ORDER — VITAMIN D3 25 MCG (1000 UNIT) PO TABS
1000.0000 [IU] | ORAL_TABLET | Freq: Every day | ORAL | Status: DC
  Administered 2013-06-29 – 2013-06-30 (×2): 1000 [IU] via ORAL
  Filled 2013-06-29 (×2): qty 1

## 2013-06-29 MED ORDER — BOOST / RESOURCE BREEZE PO LIQD: 1.0000 | ORAL | Status: DC | PRN

## 2013-06-29 MED ORDER — MYCOPHENOLATE SODIUM 180 MG PO TBEC
1440.0000 mg | DELAYED_RELEASE_TABLET | Freq: Two times a day (BID) | ORAL | Status: DC
  Filled 2013-06-29 (×2): qty 8

## 2013-06-29 MED ORDER — CALCITRIOL 0.5 MCG PO CAPS
0.5000 ug | ORAL_CAPSULE | Freq: Two times a day (BID) | ORAL | Status: DC
  Administered 2013-06-29 – 2013-06-30 (×3): 0.5 ug via ORAL
  Filled 2013-06-29 (×4): qty 1

## 2013-06-29 MED ORDER — PREDNISONE 10 MG PO TABS
10.0000 mg | ORAL_TABLET | Freq: Every day | ORAL | Status: DC
  Administered 2013-06-29: 10 mg via ORAL
  Filled 2013-06-29 (×2): qty 1

## 2013-06-29 MED ORDER — TACROLIMUS 1 MG PO CAPS
9.0000 mg | ORAL_CAPSULE | Freq: Two times a day (BID) | ORAL | Status: DC
  Filled 2013-06-29 (×2): qty 9

## 2013-06-29 MED ORDER — POTASSIUM CHLORIDE CRYS ER 20 MEQ PO TBCR
20.0000 meq | EXTENDED_RELEASE_TABLET | Freq: Once | ORAL | Status: AC
  Administered 2013-06-29: 20 meq via ORAL
  Filled 2013-06-29: qty 1

## 2013-06-29 NOTE — Progress Notes (Signed)
[facility]                                                                                Patient Demographics  Raven Walker, is a 32 y.o. female, DOB - [birth year], OZD:[id number]  Admit date - 06/28/2013   Admitting Physician Derrill Kay, MD  Outpatient Primary MD for the patient is Ala Bent, MD  LOS - 1   No chief complaint on file.       Assessment & Plan    1. Acute pyelonephritis - urine cultures are pending, she is afebrile, empiric IV Zosyn along with IV fluids to be continued, with supportive care she is feeling better. Systemic complaints of nausea vomiting and generalized weakness improved, will start her on clear liquid diet and monitor cultures.   2. Chronic kidney disease stage III in a patient with history of renal transplant one year ago - continue IV fluids, continue her transplant medications which are prograf $RemoveBef'9mg'cwXZOGjAFm$  bid, mycophenolate 360 mg 4 tab bid and prednisone. At home dose. Renal has been consulted they will be following the patient while she is here.    3. GERD. IV protonix tolerates food we'll switch to oral tomorrow.    4. ? Mild AKI vs baseline CKD - IV hydration. Renal to see, will try to find out her baseline.    5. Mildly elevated INR. Could be due to persistent nausea vomiting with relative low vitamin K levels, other liver enzymes are stable, we'll monitor repeat INR tomorrow     Code Status: full   Family Communication: none present   Disposition Plan: Home   Procedures     Consults Renal   DVT Prophylaxis   Heparin    Lab Results  Component Value Date   PLT 229 06/29/2013   PLT 240 06/29/2013    Medications  Scheduled Meds: . antiseptic oral rinse  15 mL Mouth Rinse BID  . heparin  5,000 Units Subcutaneous Q8H  . influenza vac split quadrivalent PF  0.5 mL Intramuscular Tomorrow-1000  . mycophenolate  1,440 mg Oral BID  . pantoprazole  40 mg Oral BID  . piperacillin-tazobactam (ZOSYN)  IV  3.375 g  Intravenous Q8H  . pneumococcal 23 valent vaccine  0.5 mL Intramuscular Tomorrow-1000  . potassium chloride  20 mEq Oral Once  . predniSONE  10 mg Oral Q breakfast  . sevelamer carbonate  2.4 g Oral TID WC  . tacrolimus  9 mg Oral BID   Continuous Infusions: . sodium chloride 75 mL/hr at 06/29/13 0422   PRN Meds:.acetaminophen, acetaminophen, gi cocktail, HYDROcodone-acetaminophen, menthol-cetylpyridinium, morphine injection, ondansetron (ZOFRAN) IV, ondansetron  Antibiotics    Anti-infectives   Start     Dose/Rate Route Frequency Ordered Stop   06/29/13 2359  piperacillin-tazobactam (ZOSYN) IVPB 3.375 g     3.375 g 12.5 mL/hr over 240 Minutes Intravenous 3 times per day 06/28/13 2345         Time Spent in minutes   35   Sadarius Norman K M.D on 06/29/2013 at 10:31 AM  Between 7am to 7pm - Pager - [phone]  After 7pm go to [email or web address] - password [email or web address]  And look for the night coverage  person covering for me after hours  [facility] Office  [phone]    Subjective:   Raven Walker today has, No headache, No chest pain, No abdominal pain - No Nausea, No new weakness tingling or numbness, No Cough - SOB. Some generalized body aches but feels better  Objective:   Filed Vitals:   06/28/13 2212 06/28/13 2224 06/29/13 0514  BP:  112/74 107/69  Pulse:  101 107  Temp:  99.6 F (37.6 C) 100 F (37.8 C)  TempSrc:  Oral Oral  Resp:  18 20  Height: 5' 9.5" (1.765 m)    Weight: 92.715 kg (204 lb 6.4 oz)  93.622 kg (206 lb 6.4 oz)  SpO2:  98% 97%    Wt Readings from Last 3 Encounters:  06/29/13 93.622 kg (206 lb 6.4 oz)  06/13/13 94.802 kg (209 lb)  08/26/11 62.143 kg (137 lb)     Intake/Output Summary (Last 24 hours) at 06/29/13 1031 Last data filed at 06/29/13 0605  Gross per 24 hour  Intake    490 ml  Output    350 ml  Net    140 ml    Exam Awake Alert, Oriented X 3, No new F.N deficits, Normal affect [place].AT,PERRAL Supple Neck,No  JVD, No cervical lymphadenopathy appriciated.  Symmetrical Chest wall movement, Good air movement bilaterally, CTAB RRR,No Gallops,Rubs or new Murmurs, No Parasternal Heave +ve B.Sounds, Abd Soft, Non tender, No organomegaly appriciated, No rebound - guarding or rigidity. Mild pain and discomfort on the right lower quadrant around her transplant site No Cyanosis, Clubbing or edema, No new Rash or bruise      Data Review   Micro Results Recent Results (from the past 240 hour(s))  CLOSTRIDIUM DIFFICILE BY PCR     Status: None   Collection Time    06/29/13  3:45 AM      Result Value Range Status   C difficile by pcr NEGATIVE  NEGATIVE Final    Radiology Reports No results found.  CBC  Recent Labs Lab 06/29/13 0028 06/29/13 0834  WBC 10.1 11.9*  HGB 8.0* 8.3*  HCT 24.6* 26.3*  PLT 191 229  240  MCV 76.4* 76.5*  MCH 24.8* 24.1*  MCHC 32.5 31.6  RDW 15.3 15.1  LYMPHSABS 0.7 0.9  MONOABS 1.1* 1.7*  EOSABS 0.0 0.0  BASOSABS 0.0 0.0    Chemistries   Recent Labs Lab 06/29/13 0028 06/29/13 0834  NA 142 138  K 3.4* 3.4*  CL 108 103  CO2 21 22  GLUCOSE 127* 122*  BUN 16 16  CREATININE 2.07* 2.27*  CALCIUM 6.2* 6.6*  AST 19 19  ALT 7 12  ALKPHOS 47 60  BILITOT 0.3 0.2*   ------------------------------------------------------------------------------------------------------------------ estimated creatinine clearance is 42.9 ml/min (by C-G formula based on Cr of 2.27). ------------------------------------------------------------------------------------------------------------------ No results found for this basename: HGBA1C,  in the last 72 hours ------------------------------------------------------------------------------------------------------------------ No results found for this basename: CHOL, HDL, LDLCALC, TRIG, CHOLHDL, LDLDIRECT,  in the last 72  hours ------------------------------------------------------------------------------------------------------------------ No results found for this basename: TSH, T4TOTAL, FREET3, T3FREE, THYROIDAB,  in the last 72 hours ------------------------------------------------------------------------------------------------------------------  Recent Labs  06/29/13 0028  RETICCTPCT 1.2    Coagulation profile  Recent Labs Lab 06/29/13 0028 06/29/13 0834  INR 1.75* 1.67*  1.70*     Recent Labs  06/29/13 0834  DDIMER 2.11*    Cardiac Enzymes No results found for this basename: CK, CKMB, TROPONINI, MYOGLOBIN,  in the last 168 hours ------------------------------------------------------------------------------------------------------------------ No components found  with this basename: POCBNP,

## 2013-06-29 NOTE — Progress Notes (Signed)
INITIAL NUTRITION ASSESSMENT  DOCUMENTATION CODES Per approved criteria  -Obesity Unspecified   INTERVENTION: 1. Resource Breeze po PRN, each supplement provides 250 kcal and 9 grams of protein.   NUTRITION DIAGNOSIS: Increased nutrient needs related to hx of renal transplant as evidenced by estimated needs.   Goal: PO intake to meet >/=90% estimated nutrition needs.   Monitor:  PO intake, diet advance, weight trends, labs   Reason for Assessment: Malnutrition Screening Tool  32 y.o. female  Admitting Dx: Acute pyelonephritis  ASSESSMENT: Pt with hx of ESRD, now s/p renal transplant x1 year. Presented to [facility] and was transferred to [facility]. Pt with fever, chills, N/V/D, and body aches. Consistent with pyelonephritis.   Pt reports her appetite has been good until Tuesday. Has been trying to be more active and reduce intake to lose some weight. Currently on clear liquids diet. Expect good return of appetite.    Height: Ht Readings from Last 1 Encounters:  06/28/13 5' 9.5" (1.765 m)    Weight: Wt Readings from Last 1 Encounters:  06/29/13 206 lb 6.4 oz (93.622 kg)    Ideal Body Weight: 148 lbs   % Ideal Body Weight: 139%  Wt Readings from Last 10 Encounters:  06/29/13 206 lb 6.4 oz (93.622 kg)  06/13/13 209 lb (94.802 kg)  08/26/11 137 lb (62.143 kg)  08/22/11 134 lb (60.782 kg)  08/05/11 133 lb 12.8 oz (60.69 kg)  05/19/11 156 lb 12.8 oz (71.124 kg)    Usual Body Weight: 206 lbs   % Usual Body Weight: 100%  BMI:  Body mass index is 30.05 kg/(m^2). obesity class 1   Estimated Nutritional Needs: Kcal: 2200-2400 Protein: 74-93 gm  Fluid: 2.2-2.4 L   Skin: WNL   Diet Order: Clear Liquid  EDUCATION NEEDS: -No education needs identified at this time   Intake/Output Summary (Last 24 hours) at 06/29/13 1338 Last data filed at 06/29/13 0605  Gross per 24 hour  Intake    490 ml  Output    350 ml  Net    140 ml    Last BM: 9/25    Labs:   Recent Labs Lab 06/29/13 0028 06/29/13 0834  NA 142 138  K 3.4* 3.4*  CL 108 103  CO2 21 22  BUN 16 16  CREATININE 2.07* 2.27*  CALCIUM 6.2* 6.6*  GLUCOSE 127* 122*    CBG (last 3)   Recent Labs  06/29/13 0849  GLUCAP 116*    Scheduled Meds: . antiseptic oral rinse  15 mL Mouth Rinse BID  . heparin  5,000 Units Subcutaneous Q8H  . influenza vac split quadrivalent PF  0.5 mL Intramuscular Tomorrow-1000  . mycophenolate  1,000 mg Oral BID  . pantoprazole  40 mg Oral BID  . piperacillin-tazobactam (ZOSYN)  IV  3.375 g Intravenous Q8H  . pneumococcal 23 valent vaccine  0.5 mL Intramuscular Tomorrow-1000  . predniSONE  10 mg Oral Q breakfast  . sevelamer carbonate  2.4 g Oral TID WC  . tacrolimus  5 mg Oral BID    Continuous Infusions: . sodium chloride 75 mL/hr at 06/29/13 0422    Past Medical History  Diagnosis Date  . Hypertension   . Poor appetite   . 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 GERD (gastroesophageal reflux disease) Dec. 2010    ulcerative esophagitis per EGD   . Secondary hyperparathyroidism     s/p parathyroidectomy with autotransplantation to  left arm per Dr. Baker Pierini on 08/04/11  . Streptococcal peritonitis  Sept. 2012    Past Surgical History  Procedure Laterality Date  . Arm graphs      three - done at [facility]  . Parathyroidectomy  08/05/11    with graft of parathyroid into left upper arm.    Pricilla Holm RD, LDN Pager [phone] After Hours pager [phone]

## 2013-06-29 NOTE — Progress Notes (Signed)
Utilization review completed. Levy Wellman, RN, BSN. 

## 2013-06-29 NOTE — H&P (Signed)
[facility] History and Physical  Patient: Raven Walker  JKD:[id number]  DOB: [birth year]  DOA: 06/28/2013 Pt was seen on 06/28/2013,   Referring physician: Lubertha Sayres PCP: Ala Bent, MD  Consults:     Chief Complaint: Nausea and vomiting with generalized weakness  HPI: Raven Walker is a 32 y.o. female with Past medical history of ESRD, hypertension, renal transplant in 2030, hyperparathyroidism. She presented today with the complaint of nausea and vomiting that has been ongoing since last 3-4 days with symptoms being worse Monday and Sunday. Associated with that she had some abdominal pain as well which was diffuse in nature more on the right side than on the left. She complains the pain is sharp nonradiating. She also had some dizziness and lightheadedness but did not pass out. She started having some fever since yesterday associated with chills but she has not measured her temperature. She denies any shortness of breath or cough denies any diarrhea or constipation. She does mention that she has increased urination but denies any burning sensation or blood in it. She denies any focal neurological deficit. She denies any leg swelling or calf tenderness or recent immobilization  Review of Systems: as mentioned in the history of present illness.  A Comprehensive review of the other systems is negative.  Past Medical History  Diagnosis Date  . Hypertension   . Poor appetite   . 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 GERD (gastroesophageal reflux disease) Dec. 2010    ulcerative esophagitis per EGD   . Secondary hyperparathyroidism     s/p parathyroidectomy with autotransplantation to left arm per Dr. Baker Pierini on 08/04/11  . Streptococcal peritonitis  Sept. 2012   Past Surgical History  Procedure Laterality Date  . Arm graphs      three - done at [facility]  . Parathyroidectomy  08/05/11    with  graft of parathyroid into left upper arm.   Social History:  reports that she has quit smoking. She has never used smokeless tobacco. She reports that  drinks alcohol. She reports that she does not use illicit drugs. Patient is coming from home. Independent for most of her  ADL.  Allergies  Allergen Reactions  . Nickel Itching and Rash    Only where touched    Family History  Problem Relation Age of Onset  . Diabetes type I Mother   . Hypertension Mother   . Diabetes type I Father   . Hypertension Father     Prior to Admission medications   Medication Sig Start Date End Date Taking? Authorizing Provider  acetaminophen (TYLENOL) 325 MG tablet Take 650 mg by mouth every 6 (six) hours as needed. For pain     Historical Provider, MD  amLODipine (NORVASC) 10 MG tablet Take 10 mg by mouth at bedtime.     Historical Provider, MD  b complex-vitamin c-folic acid (NEPHRO-VITE) 0.8 MG TABS Take 0.8 mg by mouth at bedtime.      Historical Provider, MD  calcium carbonate, dosed in mg elemental calcium, 1250 MG/5ML Take 20 mLs (2,000 mg of elemental calcium total) by mouth 4 (four) times daily. 08/23/11   Hosie Poisson, MD  Doxercalciferol (HECTOROL PO) Take by mouth 3 (three) times daily.      Historical Provider, MD  epoetin alfa (EPOGEN,PROCRIT) 09983 UNIT/ML injection Inject 20,000 Units into the skin once a week.     Historical Provider, MD  HYDROcodone-acetaminophen (VICODIN) 5-500 MG per tablet 1 tablet as needed.  08/21/11   Historical Provider, MD  pantoprazole (PROTONIX) 40 MG tablet Take 40 mg by mouth 2 (two) times daily.     Historical Provider, MD  potassium chloride 20 MEQ/15ML (10%) solution Take 80 mEq by mouth daily.      Historical Provider, MD  promethazine (PHENERGAN) 25 MG tablet 25 mg as needed.  08/17/11   Historical Provider, MD  Sevelamer Carbonate 2.4 G PACK Take 2.4 g by mouth 3 (three) times daily with meals.      Historical Provider, MD    Physical Exam: Filed  Vitals:   06/28/13 2212 06/28/13 2224  BP:  112/74  Pulse:  101  Temp:  99.6 F (37.6 C)  TempSrc:  Oral  Resp:  18  Height: 5' 9.5" (1.765 m)   Weight: 92.715 kg (204 lb 6.4 oz)   SpO2:  98%    General: Alert, Awake and Oriented to Time, Place and Person. Appear in mild distress, patient speaking in was due to sore throat that she mentions is secondary to excess vomiting Eyes: PERRL ENT: Oral Mucosa clear dry. Neck: No  JVD, no  Carotid Bruits  Cardiovascular: S1 and S2 Present, aortic systolic  Murmur, Peripheral Pulses Present Respiratory: Bilateral Air entry equal and Decreased, Clear to Auscultation,  No  Crackles,no  wheezes Abdomen: Bowel Sound Present, Soft and diffusely  tenderNo guarding no rigidity  Skin: No  Rash Extremities: Trace  Pedal edema, no  calf tenderness Neurologic: Grossly Unremarkable.  Labs on Admission:  CBC:  Recent Labs Lab 06/29/13 0028  WBC 10.1  NEUTROABS 8.3*  HGB 8.0*  HCT 24.6*  MCV 76.4*  PLT 191    CMP     Component Value Date/Time   NA 141 08/23/2011 0610   K 3.7 08/23/2011 0610   CL 98 08/23/2011 0610   CO2 32 08/23/2011 0610   GLUCOSE 96 08/23/2011 0610   BUN 21 08/23/2011 0610   CREATININE 11.79* 08/23/2011 0610   CALCIUM 7.1* 08/23/2011 0610   CALCIUM 5.9* 08/19/2011 0704   PROT 4.9* 08/21/2011 0600   ALBUMIN 2.0* 08/23/2011 0610   AST 13 08/21/2011 0600   ALT <5 08/21/2011 0600   ALKPHOS 206* 08/21/2011 0600   BILITOT 0.1* 08/21/2011 0600   GFRNONAA 4* 08/23/2011 0610   GFRAA 4* 08/23/2011 0610   Assessment/Plan Principal Problem:   Acute pyelonephritis Active Problems:   Hyperparathyroidism, secondary   Renal failure (ARF), acute on chronic   GERD (gastroesophageal reflux disease)   History of renal transplant   1. Acute pyelonephritis The patient presents with generalized weakness nausea and vomiting that has been ongoing since last few days.  She has elevated S creat, hypokalemia, normal lactate,  leucocytosis, UA suggestive of 30-40 WBC with positive nitrites, normal chest x ray, and a CT abdomen that is suggestive of atrophic left kidney, and perinephric stranding of the right kidney, without any other acute abnormalities. i will give her Zosyn for gram positive and gram negative coverage. I will also hydrate with IV fluids. She may need nephrology input in AM.  2.renal transplant I will resume her prograf $RemoveBef'9mg'oknQWwMdOb$  bid, mycophenolate 360 mg 4 tab bid and prednisone. Antibiotics are broaden due to her transplant state.  3. GERD.  IV protonix.  4.AKI IV hydration. Likely due to prerenal etiology due to excess vomiting.  DVT Prophylaxis: subcutaneous Heparin Nutrition: renal diet  Code Status: full  Disposition: Admitted to inpatient in  med-surge.  Author: Berle Mull, MD [facility] Pager: [phone]  If 7PM-7AM, please contact night-coverage [email or web address] Password [email or web address]

## 2013-06-29 NOTE — Consult Note (Signed)
[facility] Consult Note  Reason for Consult: Hx of renal transplant, UTI Referring Physician: Dr. Candiss Norse ([facility])  HPI:  Raven Walker is an 32 y.o. female who had a cadaveric renal transplant one year ago at [facility], who presented to [facility] not feeling well, transferred to [facility] yesterday 9/24. Tues though felt hot and had a fever of 103. She had also had some diarrhea, 3 episodes of diarrhea which was green in color. Was dry heaving and vomiting dark-colored emesis. Vomited with ingestion of anything (solid or liquid). At [facility] she received a nausea medicine and is now feeling better and tolerating PO without any further vomiting. Did have some diarrhea overnight but none so far today. Has been urinating her normal amount at home. Does not take NSAID's, only tylenol for pain. Denies dysuria. Endorses fluctuations in temperature feeling hot & cold intermittently. + headache & dizziness/vertigo. Gets chills/shakes with cold feeling. Entire body aches, especially in legs and lower back.  History of congenital unilateral renal agenesis, with eventual ESRD secondary to hypertension according to outpatient records. Did peritoneal dialysis starting in 2009 until being transplanted of a cadaveric kidney which was CMV positive (pt CMV negative). She had a prior episode of illness in June of this year, and was sent to [facility]. She had a biopsy of her transplanted kidney at that time, which according to pt showed no signs of rejection. Was admitted there for 5 days. Diagnosed with infection, and had her prograf dose reduced to $RemoveBe'5mg'lyvHdBBGG$  BID from $Remove'6mg'StREUSK$  BID. So this appears to be her second episode of severe UTI since transplant.  Everything, leukocytosis, pyuria, tenderness in transplanted kidney seems to go along with pyelo of transplanted kidney   Past Medical History  Diagnosis Date  . Hypertension   . Poor appetite   . ESRD on peritoneal dialysis     secondary to HTN and congenital  single kidney  . GERD (gastroesophageal reflux disease) Dec. 2010    ulcerative esophagitis per EGD   . Secondary hyperparathyroidism     s/p parathyroidectomy with autotransplantation to left arm per Dr. Baker Pierini on 08/04/11  . Streptococcal peritonitis  Sept. 2012    Past Surgical History  Procedure Laterality Date  . Arm graphs      three - done at [facility]  . Parathyroidectomy  08/05/11    with graft of parathyroid into left upper arm.    Family History  Problem Relation Age of Onset  . Diabetes type I Mother   . Hypertension Mother   . Diabetes type I Father   . Hypertension Father    No family hx of renal disease. + Fhx for hypertension in grandmother, aunts, uncles on both sides of family. No other medical problems in family except for grandmother with diabetes. Has two siblings who are healthy.   Social History:  reports that she has quit smoking. She has never used smokeless tobacco. She reports that  drinks alcohol. She reports that she does not use illicit drugs. No smoking or illicit drug use. Occasionally drinks alcohol.  Allergies:  Allergies  Allergen Reactions  . Nickel Itching and Rash    Only where touched    Medications:  Scheduled Meds: . antiseptic oral rinse  15 mL Mouth Rinse BID  . heparin  5,000 Units Subcutaneous Q8H  . influenza vac split quadrivalent PF  0.5 mL Intramuscular Tomorrow-1000  . mycophenolate  1,000 mg Oral BID  . pantoprazole  40 mg Oral BID  .  piperacillin-tazobactam (ZOSYN)  IV  3.375 g Intravenous Q8H  . pneumococcal 23 valent vaccine  0.5 mL Intramuscular Tomorrow-1000  . potassium chloride  20 mEq Oral Once  . predniSONE  10 mg Oral Q breakfast  . sevelamer carbonate  2.4 g Oral TID WC  . tacrolimus  5 mg Oral BID   Continuous Infusions: . sodium chloride 75 mL/hr at 06/29/13 0422   PRN Meds:.acetaminophen, acetaminophen, gi cocktail, HYDROcodone-acetaminophen, menthol-cetylpyridinium, morphine injection,  ondansetron (ZOFRAN) IV, ondansetron    Results for orders placed during the hospital encounter of 06/28/13 (from the past 48 hour(s))  COMPREHENSIVE METABOLIC PANEL     Status: Abnormal   Collection Time    06/29/13 12:28 AM      Result Value Range   Sodium 142  135 - 145 mEq/L   Potassium 3.4 (*) 3.5 - 5.1 mEq/L   Chloride 108  96 - 112 mEq/L   CO2 21  19 - 32 mEq/L   Glucose, Bld 127 (*) 70 - 99 mg/dL   BUN 16  6 - 23 mg/dL   Creatinine, Ser 2.07 (*) 0.50 - 1.10 mg/dL   Calcium 6.2 (*) 8.4 - 10.5 mg/dL   Comment: CRITICAL RESULT CALLED TO, READ BACK BY AND VERIFIED WITH:     LANEY,E RN 06/29/2013 0109 JORDANS     REPEATED TO VERIFY   Total Protein 6.3  6.0 - 8.3 g/dL   Albumin 3.0 (*) 3.5 - 5.2 g/dL   AST 19  0 - 37 U/L   ALT 7  0 - 35 U/L   Alkaline Phosphatase 47  39 - 117 U/L   Total Bilirubin 0.3  0.3 - 1.2 mg/dL   GFR calc non Af Amer 30 (*) >90 mL/min   GFR calc Af Amer 35 (*) >90 mL/min   Comment: (NOTE)     The eGFR has been calculated using the CKD EPI equation.     This calculation has not been validated in all clinical situations.     eGFR's persistently <90 mL/min signify possible Chronic Kidney     Disease.  CBC WITH DIFFERENTIAL     Status: Abnormal   Collection Time    06/29/13 12:28 AM      Result Value Range   WBC 10.1  4.0 - 10.5 K/uL   RBC 3.22 (*) 3.87 - 5.11 MIL/uL   Hemoglobin 8.0 (*) 12.0 - 15.0 g/dL   HCT 24.6 (*) 36.0 - 46.0 %   MCV 76.4 (*) 78.0 - 100.0 fL   MCH 24.8 (*) 26.0 - 34.0 pg   MCHC 32.5  30.0 - 36.0 g/dL   RDW 15.3  11.5 - 15.5 %   Platelets 191  150 - 400 K/uL   Comment: PLATELET COUNT CONFIRMED BY SMEAR   Neutrophils Relative % 82 (*) 43 - 77 %   Neutro Abs 8.3 (*) 1.7 - 7.7 K/uL   Lymphocytes Relative 7 (*) 12 - 46 %   Lymphs Abs 0.7  0.7 - 4.0 K/uL   Monocytes Relative 11  3 - 12 %   Monocytes Absolute 1.1 (*) 0.1 - 1.0 K/uL   Eosinophils Relative 0  0 - 5 %   Eosinophils Absolute 0.0  0.0 - 0.7 K/uL   Basophils  Relative 0  0 - 1 %   Basophils Absolute 0.0  0.0 - 0.1 K/uL   RBC Morphology SCHISTOCYTES PRESENT (2-5/hpf)    PROTIME-INR     Status: Abnormal   Collection Time  06/29/13 12:28 AM      Result Value Range   Prothrombin Time 19.9 (*) 11.6 - 15.2 seconds   INR 1.75 (*) 0.00 - 1.49  RETICULOCYTES     Status: Abnormal   Collection Time    06/29/13 12:28 AM      Result Value Range   Retic Ct Pct 1.2  0.4 - 3.1 %   RBC. 3.22 (*) 3.87 - 5.11 MIL/uL   Retic Count, Manual 38.6  19.0 - 186.0 K/uL  TSH     Status: None   Collection Time    06/29/13 12:28 AM      Result Value Range   TSH 1.863  0.350 - 4.500 uIU/mL   Comment: Performed at [place]     Status: Abnormal   Collection Time    06/29/13  3:33 AM      Result Value Range   Color, Urine YELLOW  YELLOW   APPearance CLOUDY (*) CLEAR   Specific Gravity, Urine 1.017  1.005 - 1.030   pH 5.5  5.0 - 8.0   Glucose, UA NEGATIVE  NEGATIVE mg/dL   Hgb urine dipstick SMALL (*) NEGATIVE   Bilirubin Urine NEGATIVE  NEGATIVE   Ketones, ur NEGATIVE  NEGATIVE mg/dL   Protein, ur 100 (*) NEGATIVE mg/dL   Urobilinogen, UA 0.2  0.0 - 1.0 mg/dL   Nitrite POSITIVE (*) NEGATIVE   Leukocytes, UA MODERATE (*) NEGATIVE  URINE MICROSCOPIC-ADD ON     Status: Abnormal   Collection Time    06/29/13  3:33 AM      Result Value Range   Squamous Epithelial / LPF FEW (*) RARE   WBC, UA 21-50  <3 WBC/hpf   RBC / HPF 3-6  <3 RBC/hpf   Bacteria, UA MANY (*) RARE  CLOSTRIDIUM DIFFICILE BY PCR     Status: None   Collection Time    06/29/13  3:45 AM      Result Value Range   C difficile by pcr NEGATIVE  NEGATIVE  CBC WITH DIFFERENTIAL     Status: Abnormal   Collection Time    06/29/13  8:34 AM      Result Value Range   WBC 11.9 (*) 4.0 - 10.5 K/uL   RBC 3.44 (*) 3.87 - 5.11 MIL/uL   Hemoglobin 8.3 (*) 12.0 - 15.0 g/dL   HCT 26.3 (*) 36.0 - 46.0 %   MCV 76.5 (*) 78.0 - 100.0 fL   MCH 24.1 (*)  26.0 - 34.0 pg   MCHC 31.6  30.0 - 36.0 g/dL   RDW 15.1  11.5 - 15.5 %   Platelets 229  150 - 400 K/uL   Neutrophils Relative % 78 (*) 43 - 77 %   Neutro Abs 9.3 (*) 1.7 - 7.7 K/uL   Lymphocytes Relative 7 (*) 12 - 46 %   Lymphs Abs 0.9  0.7 - 4.0 K/uL   Monocytes Relative 14 (*) 3 - 12 %   Monocytes Absolute 1.7 (*) 0.1 - 1.0 K/uL   Eosinophils Relative 0  0 - 5 %   Eosinophils Absolute 0.0  0.0 - 0.7 K/uL   Basophils Relative 0  0 - 1 %   Basophils Absolute 0.0  0.0 - 0.1 K/uL  PROTIME-INR     Status: Abnormal   Collection Time    06/29/13  8:34 AM      Result Value Range   Prothrombin Time 19.2 (*) 11.6 -  15.2 seconds   INR 1.67 (*) 0.00 - 1.49  DIC (DISSEMINATED INTRAVASCULAR COAGULATION) PANEL     Status: Abnormal   Collection Time    06/29/13  8:34 AM      Result Value Range   Prothrombin Time 19.5 (*) 11.6 - 15.2 seconds   INR 1.70 (*) 0.00 - 1.49   aPTT 36  24 - 37 seconds   Fibrinogen 679 (*) 204 - 475 mg/dL   D-Dimer, Quant 2.11 (*) 0.00 - 0.48 ug/mL-FEU   Comment:            AT THE INHOUSE ESTABLISHED CUTOFF     VALUE OF 0.48 ug/mL FEU,     THIS ASSAY HAS BEEN DOCUMENTED     IN THE LITERATURE TO HAVE     A SENSITIVITY AND NEGATIVE     PREDICTIVE VALUE OF AT LEAST     98 TO 99%.  THE TEST RESULT     SHOULD BE CORRELATED WITH     AN ASSESSMENT OF THE CLINICAL     PROBABILITY OF DVT / VTE.   Platelets 240  150 - 400 K/uL   Smear Review NO SCHISTOCYTES SEEN    COMPREHENSIVE METABOLIC PANEL     Status: Abnormal   Collection Time    06/29/13  8:34 AM      Result Value Range   Sodium 138  135 - 145 mEq/L   Potassium 3.4 (*) 3.5 - 5.1 mEq/L   Chloride 103  96 - 112 mEq/L   CO2 22  19 - 32 mEq/L   Glucose, Bld 122 (*) 70 - 99 mg/dL   BUN 16  6 - 23 mg/dL   Creatinine, Ser 2.27 (*) 0.50 - 1.10 mg/dL   Calcium 6.6 (*) 8.4 - 10.5 mg/dL   Total Protein 6.9  6.0 - 8.3 g/dL   Albumin 3.2 (*) 3.5 - 5.2 g/dL   AST 19  0 - 37 U/L   ALT 12  0 - 35 U/L   Alkaline  Phosphatase 60  39 - 117 U/L   Total Bilirubin 0.2 (*) 0.3 - 1.2 mg/dL   GFR calc non Af Amer 27 (*) >90 mL/min   GFR calc Af Amer 31 (*) >90 mL/min   Comment: (NOTE)     The eGFR has been calculated using the CKD EPI equation.     This calculation has not been validated in all clinical situations.     eGFR's persistently <90 mL/min signify possible Chronic Kidney     Disease.  GLUCOSE, CAPILLARY     Status: Abnormal   Collection Time    06/29/13  8:49 AM      Result Value Range   Glucose-Capillary 116 (*) 70 - 99 mg/dL    No results found.  ROS: Per HPI, otherwise negative  BP 107/69  Pulse 107  Temp(Src) 100 F (37.8 C) (Oral)  Resp 20  Ht 5' 9.5" (1.765 m)  Wt 206 lb 6.4 oz (93.622 kg)  BMI 30.05 kg/m2  SpO2 97% Gen: NAD HEENT: NCAT, MMM, no oropharyngeal exudates Heart: RRR, no m/r/g Lungs: CTAB, NWOB Abd: soft. Some fullness in RLQ, mildly to moderately TTP. Back: tender across entire lower back. Unclear if CVA tenderness. Ext: No appreciable lower extremity edema bilaterally. 2+ pulses in all extremities. Skin: no rashes noted, she is very warm Neuro: grossly nonfocal, speech intact, PERRL  Assessment/Plan: 1. Renal transplant: at this time no overt signs of rejection. Her creatinine was last  checked on 8/26 and was 2.01 at that time. Currently with mild bump in creatinine (2.21 today). Agree with IVF hydration & Zosyn, await cultures. Will continue outpatient transplant medications (have adjusted doses based on her actual home regimen). Obtained records from [facility], reviewed them and will place in paper chart. Recent CMV titers were negative. 2. Hypertension: Currently with soft BP's so will hold BP medicines for another day or so at least. Her home regimen consists of amlodipine $RemoveBefor'10mg'wgEfTxmptSRQ$  BID, 4. Anemia of ESRD: seems to be on Procrit 30,000 units SQ q3 weeks as an outpatient. Will defer giving that here while in the hospital. Hgb stable compared to when last  checked as outpatient (8.6 on 8/26). Iron % sat at that time was 25. No indication for IV iron load right now. 5. Metabolic Bone Disease: hx secondary hyperparathyroidism s/p parathyroidectomy with autotransplantation. PTH was 15 on 8/26. On calcitriol & calcium carbonate as outpatient.  Chrisandra Netters, MD Family Medicine PGY-2  Renal Service Resident  Patient seen and examined, agree with above note with above modifications. Briefly a 32 year old BF s/p renal transplant in 2013 at [facility].  She has some renal insufficiency and biopsy showing in the past some interstitial nephritis and prograf toxicity- meds adjusted.  She now presents with pyelo, renal function actually seems close to baseline but has been affected by the infection and may have some volume depletion as well.  Agree with management to date, getting home meds where they should be with pharmacy help and will follow along.  Raven Parish, MD 06/29/2013   ]

## 2013-06-29 NOTE — Progress Notes (Signed)
CRITICAL VALUE ALERT  Critical value received:  Ca 6.2  Date of notification:  06/29/13  Time of notification:  0115  Critical value read back:yes  Nurse who received alert:  Benjamine Mola, RN  MD notified (1st page):  Baltazar Najjar  Time of first page:  0117  MD notified (2nd page):  Time of second page:  Responding MD:  Baltazar Najjar  Time MD responded:  [phone]

## 2013-06-30 LAB — URINE CULTURE: Colony Count: 5000

## 2013-06-30 LAB — CBC
HCT: 23.7 % — ABNORMAL LOW (ref 36.0–46.0)
MCHC: 32.5 g/dL (ref 30.0–36.0)
MCV: 75.2 fL — ABNORMAL LOW (ref 78.0–100.0)
Platelets: 227 10*3/uL (ref 150–400)
RDW: 15.2 % (ref 11.5–15.5)
WBC: 10.2 10*3/uL (ref 4.0–10.5)

## 2013-06-30 LAB — RENAL FUNCTION PANEL
Albumin: 2.7 g/dL — ABNORMAL LOW (ref 3.5–5.2)
BUN: 17 mg/dL (ref 6–23)
Calcium: 6.7 mg/dL — ABNORMAL LOW (ref 8.4–10.5)
Chloride: 104 mEq/L (ref 96–112)
Creatinine, Ser: 2.11 mg/dL — ABNORMAL HIGH (ref 0.50–1.10)
GFR calc non Af Amer: 29 mL/min — ABNORMAL LOW (ref >90)
Phosphorus: 3 mg/dL (ref 2.3–4.6)

## 2013-06-30 LAB — PROTIME-INR
INR: 1.47 (ref 0.00–1.49)
Prothrombin Time: 17.4 seconds — ABNORMAL HIGH (ref 11.6–15.2)

## 2013-06-30 MED ORDER — HYDROCODONE-ACETAMINOPHEN 5-500 MG PO TABS: 1.0000 | ORAL_TABLET | Freq: Three times a day (TID) | ORAL | Status: DC | PRN

## 2013-06-30 MED ORDER — POTASSIUM CHLORIDE CRYS ER 20 MEQ PO TBCR
20.0000 meq | EXTENDED_RELEASE_TABLET | Freq: Once | ORAL | Status: AC
  Administered 2013-06-30: 20 meq via ORAL
  Filled 2013-06-30: qty 1

## 2013-06-30 MED ORDER — LEVOFLOXACIN 250 MG PO TABS: 250.0000 mg | ORAL_TABLET | Freq: Every day | ORAL | Status: DC

## 2013-06-30 MED ORDER — POTASSIUM CHLORIDE CRYS ER 20 MEQ PO TBCR: 40.0000 meq | EXTENDED_RELEASE_TABLET | Freq: Once | ORAL | Status: AC

## 2013-06-30 MED ORDER — LEVOFLOXACIN 250 MG PO TABS
250.0000 mg | ORAL_TABLET | Freq: Every day | ORAL | Status: DC
  Administered 2013-06-30: 250 mg via ORAL
  Filled 2013-06-30: qty 1

## 2013-06-30 NOTE — Discharge Summary (Signed)
Physician Discharge Summary  ZANOBIA GRIEBEL VPX:[id number] DOB: 06-17-1981 DOA: 06/28/2013  PCP: Ala Bent, MD  Admit date: 06/28/2013 Discharge date: 06/30/2013  Time spent: 45 minutes  Recommendations for Outpatient Follow-up:  1. Continue Levaquin as prescribed 2. Follow up with Dr. Jimmy Footman with [facility] in 3-4 days  Discharge Diagnoses:  Principal Problem:   Acute pyelonephritis Active Problems:   Hyperparathyroidism, secondary   Renal failure (ARF), acute on chronic   GERD (gastroesophageal reflux disease)   History of renal transplant   Discharge Condition: Stable  Diet recommendation: Renal diet  Filed Weights   06/28/13 2212 06/29/13 0514  Weight: 92.715 kg (204 lb 6.4 oz) 93.622 kg (206 lb 6.4 oz)    History of present illness at admission:   Raven Walker is a 32 y.o. female with a past medical history of ESRD, hypertension, renal transplant in Sept 2013 at [facility] and hyperparathyroidism, presents with chief complaint of nausea and vomiting for 3-4 days. Associated symptoms include diffuse abdominal pain which is more concentrated on the right side than on the left. She reports that the pain is sharp and nonradiating. She also had some dizziness and lightheadedness but has not passed out. She started having a tactile fever with chills since yesterday.  She denied any shortness of breath or cough denies any diarrhea or constipation.  She did mention that she has increased urination but denies any burning sensation or hematuria.  She denied any focal neurological deficit, leg swelling, calf tenderness, or recent immobilization.    Hospital Course:     Acute Pyelonephritis Secondary to UTI Urinalysis results positive for leukocytes, bacteria, nitrites  Insignificant growth on urine culture Of note, previous UTI/pyelonephritis in June 2014, hospitalized at [facility] In response to Empiric IV Zosyn with IV fluids started  Patient remained  afebrile but n/v and weakness improved Case discussed with nephrologist Dr. Clover Mealy and will be discharged on 10 more days of oral Levaquin    Mild AKI on Chronic Kidney Disease Stage III S/P Renal transplant (06/19/2012) Creatinine close to baseline now after hydration. Was secondary to acute Pyelonephritis with nausea vomiting and dehydration     Hypokalemia K has been replaced. Will follow with renal in 3-4 days     Hypocalcemia Likely secondary to CKD Stage III Ca= 6.2 on admission IV fluids for hydration Most recent BMP, Ca=6.7 Renal follow up for treatment    GERD IV Protonix  Resume po pantoprazole upon discharge    History of Renal Transplant S/P transplant at [facility] (06/19/2012) Followed by Dr. Jimmy Footman at [place] transplant medications: Prograf 9 mg bid, mycophenolate 360 mg 4 tab bid, and prednisone Renal consulted      Procedures:  None  Consultations:  Nephrology  Discharge Exam: Filed Vitals:   06/30/13 0500  BP: 127/86  Pulse: 86  Temp: 98.5 F (36.9 C)  Resp: 18    General: Well-developed, well-nourished, African American female, in no acute distress Cardiovascular: RRR, no rubs, gallops, or murmurs Respiratory: CTA bilaterally, no rales, rhonchi, or wheezes Abdomen: soft, non-distended, bowel sounds present, mild tenderness in the RLQ and L and R flank Musculoskeletal: no edema, tender bilateral lower extremities, full ROM   Discharge Instructions  Follow with Primary MD in 3-4 days   Get CBC, CMP, follow up on your  tacrolimus level  -  checked 3-4 days by Primary MD and again as instructed by your Primary MD.    Get Medicines reviewed and adjusted.  Please request  your Prim.MD to go over all Hospital Tests and Procedure/Radiological results at the follow up, please get all Hospital records sent to your Prim MD by signing hospital release before you go home.  Activity: As tolerated with Full fall  precautions use walker/cane & assistance as needed   Diet:  Heart healthy  For Heart failure patients - Check your Weight same time everyday, if you gain over 2 pounds, or you develop in leg swelling, experience more shortness of breath or chest pain, call your Primary MD immediately. Follow Cardiac Low Salt Diet and 1.8 lit/day fluid restriction.  Disposition Home   If you experience worsening of your admission symptoms, develop shortness of breath, life threatening emergency, suicidal or homicidal thoughts you must seek medical attention immediately by calling 911 or calling your MD immediately  if symptoms less severe.  You Must read complete instructions/literature along with all the possible adverse reactions/side effects for all the Medicines you take and that have been prescribed to you. Take any new Medicines after you have completely understood and accpet all the possible adverse reactions/side effects.   Do not drive and provide baby sitting services if your were admitted for syncope or siezures until you have seen by Primary MD or a Neurologist and advised to do so again.  Do not drive when taking Pain medications.    Do not take more than prescribed Pain, Sleep and Anxiety Medications  Special Instructions: If you have smoked or chewed Tobacco  in the last 2 yrs please stop smoking, stop any regular Alcohol  and or any Recreational drug use.  Wear Seat belts while driving.   Please note  You were cared for by a hospitalist during your hospital stay. If you have any questions about your discharge medications or the care you received while you were in the hospital after you are discharged, you can call the unit and asked to speak with the hospitalist on call if the hospitalist that took care of you is not available. Once you are discharged, your primary care physician will handle any further medical issues. Please note that NO REFILLS for any discharge medications will be  authorized once you are discharged, as it is imperative that you return to your primary care physician (or establish a relationship with a primary care physician if you do not have one) for your aftercare needs so that they can reassess your need for medications and monitor your lab values.       Follow-up Information   Follow up with DETERDING,JAMES L, MD. Schedule an appointment as soon as possible for a visit in 3-4 days.   Specialty:  Nephrology   Contact information:   [place] [address] [phone]          Medication List    ASK your doctor about these medications       acetaminophen 325 MG tablet  Commonly known as:  TYLENOL  Take 650 mg by mouth every 6 (six) hours as needed. For pain     amLODipine 5 MG tablet  Commonly known as:  NORVASC  Take 5 mg by mouth every morning.     calcitRIOL 0.5 MCG capsule  Commonly known as:  ROCALTROL  Take 0.5 mcg by mouth 2 (two) times daily.     calcium carbonate 500 MG chewable tablet  Commonly known as:  TUMS - dosed in mg elemental calcium  Chew 3 tablets by mouth 3 (three) times daily.  cholecalciferol 400 UNITS Tabs tablet  Commonly known as:  VITAMIN D  Take 1,000 Units by mouth daily.     diltiazem 30 MG tablet  Commonly known as:  CARDIZEM  Take 30 mg by mouth 2 (two) times daily.     epoetin alfa 10000 UNIT/ML injection  Commonly known as:  EPOGEN,PROCRIT  Inject 30,000 Units into the skin every 21 ( twenty-one) days.     furosemide 40 MG tablet  Commonly known as:  LASIX  Take 40 mg by mouth daily.     metoprolol 50 MG tablet  Commonly known as:  LOPRESSOR  Take 50 mg by mouth every evening.     mycophenolate 250 MG capsule  Commonly known as:  CELLCEPT  Take 1,000 mg by mouth 2 (two) times daily.     pantoprazole 40 MG tablet  Commonly known as:  PROTONIX  Take 40 mg by mouth 2 (two) times daily.     predniSONE 5 MG tablet  Commonly known as:   DELTASONE  Take 5 mg by mouth daily.     PROGRAF 1 MG capsule  Generic drug:  tacrolimus  Take 5 mg by mouth 2 (two) times daily.       Allergies  Allergen Reactions  . Nickel Itching and Rash    Only where touched       Follow-up Information   Follow up with DETERDING,JAMES L, MD. Schedule an appointment as soon as possible for a visit in 1 week.   Specialty:  Nephrology   Contact information:   [place] [address] [phone]        The results of significant diagnostics from this hospitalization (including imaging, microbiology, ancillary and laboratory) are listed below for reference.    Significant Diagnostic Studies: No results found.  Microbiology: Recent Results (from the past 240 hour(s))  URINE CULTURE     Status: None   Collection Time    06/29/13  3:33 AM      Result Value Range Status   Specimen Description URINE, RANDOM   Final   Special Requests CX ADDED AT 9702 ON [id number]   Final   Culture  Setup Time     Final   Value: 06/29/2013 00:54     Performed at [facility]     Final   Value: 5,000 COLONIES/ML     Performed at [facility]   Culture     Final   Value: INSIGNIFICANT GROWTH     Performed at [facility]   Report Status 06/30/2013 FINAL   Final  CLOSTRIDIUM DIFFICILE BY PCR     Status: None   Collection Time    06/29/13  3:45 AM      Result Value Range Status   C difficile by pcr NEGATIVE  NEGATIVE Final     Labs: Basic Metabolic Panel:  Recent Labs Lab 06/29/13 0028 06/29/13 0834 06/30/13 0545  NA 142 138 138  K 3.4* 3.4* 3.2*  CL 108 103 104  CO2 $Re'21 22 22  'Xuq$ GLUCOSE 127* 122* 99  BUN $Re'16 16 17  'kBU$ CREATININE 2.07* 2.27* 2.11*  CALCIUM 6.2* 6.6* 6.7*  PHOS  --   --  3.0   Liver Function Tests:  Recent Labs Lab 06/29/13 0028 06/29/13 0834 06/30/13 0545  AST 19 19  --   ALT 7 12  --   ALKPHOS 47 60  --   BILITOT 0.3 0.2*  --  PROT 6.3  6.9  --   ALBUMIN 3.0* 3.2* 2.7*   CBC:  Recent Labs Lab 06/29/13 0028 06/29/13 0834 06/30/13 0545  WBC 10.1 11.9* 10.2  NEUTROABS 8.3* 9.3*  --   HGB 8.0* 8.3* 7.7*  HCT 24.6* 26.3* 23.7*  MCV 76.4* 76.5* 75.2*  PLT 191 229  240 227   CBG:  Recent Labs Lab 06/29/13 0849  GLUCAP 116*       Signed:  DENNIN, SARA A PA-S  [facility] 06/30/2013, 9:42 AM

## 2013-06-30 NOTE — Progress Notes (Signed)
NURSING PROGRESS NOTE  Raven Walker [id number] Discharge Data: 06/30/2013 1:59 PM Attending Provider: Thurnell Lose, MD BJY:[id number], [name], MD     [name] to be D/C'd Home per MD order.    All IV's discontinued with no bleeding noted.  All belongings returned to patient for patient to take home.   Last Vital Signs:  Blood pressure 127/86, pulse 86, temperature 98.5 F (36.9 C), temperature source Oral, resp. rate 18, height 5' 9.5" (1.765 m), weight 93.622 kg (206 lb 6.4 oz), SpO2 98.00%.  Discharge Medication List   Medication List         acetaminophen 325 MG tablet  Commonly known as:  TYLENOL  Take 650 mg by mouth every 6 (six) hours as needed. For pain     amLODipine 5 MG tablet  Commonly known as:  NORVASC  Take 5 mg by mouth every morning.     calcitRIOL 0.5 MCG capsule  Commonly known as:  ROCALTROL  Take 0.5 mcg by mouth 2 (two) times daily.     calcium carbonate 500 MG chewable tablet  Commonly known as:  TUMS - dosed in mg elemental calcium  Chew 3 tablets by mouth 3 (three) times daily.     cholecalciferol 400 UNITS Tabs tablet  Commonly known as:  VITAMIN D  Take 1,000 Units by mouth daily.     diltiazem 30 MG tablet  Commonly known as:  CARDIZEM  Take 30 mg by mouth 2 (two) times daily.     epoetin alfa 10000 UNIT/ML injection  Commonly known as:  EPOGEN,PROCRIT  Inject 30,000 Units into the skin every 21 ( twenty-one) days.     furosemide 40 MG tablet  Commonly known as:  LASIX  Take 40 mg by mouth daily.     HYDROcodone-acetaminophen 5-500 MG per tablet  Commonly known as:  VICODIN  Take 1 tablet by mouth every 8 (eight) hours as needed.     levofloxacin 250 MG tablet  Commonly known as:  LEVAQUIN  Take 1 tablet (250 mg total) by mouth daily.     metoprolol 50 MG tablet  Commonly known as:  LOPRESSOR  Take 50 mg by mouth every evening.     mycophenolate 250 MG capsule  Commonly known as:  CELLCEPT  Take  1,000 mg by mouth 2 (two) times daily.     pantoprazole 40 MG tablet  Commonly known as:  PROTONIX  Take 40 mg by mouth 2 (two) times daily.     predniSONE 5 MG tablet  Commonly known as:  DELTASONE  Take 5 mg by mouth daily.     PROGRAF 1 MG capsule  Generic drug:  tacrolimus  Take 5 mg by mouth 2 (two) times daily.        Joslyn Hy, MSN, RN, [employer]

## 2013-06-30 NOTE — Progress Notes (Signed)
Raven Walker Progress Note    Assessment/ Plan:   1. Renal transplant - Remains afebrile and with no s/s of rejection. Elevation in SCr this admission likely related to her pyelo.  - f/u tacrolimus level - continue tacrolimus, prednisone, mycophenolate - continue zosyn- will be changed to levaquin to continue with antibiotics as an OP 2. Acute renal failure - Likely due to setting of pyelo in transplanted kidney with recent volume depletion. Urine culture with insignificant growth. AG normal and bicarb normal. SCr trending back down. Her baseline approx 2 as it was 1.96 in July. Currently SCr 2.11- is really about baseline, has appt to see Dr. Jimmy Footman in October advised her to keep.    3. Hypertension - Has been off her home anti-hypertensive's since admission, BP's remain normal, will consider resuming over the weekend 4. Anemia - 7.7 today, asymptomatic. Related to her ESRD and is on Procrit 30K units SQ q3 weeks. Iron studies in August showed normal sat ratio and she is mildly below her baseline of 8.6.  She gets procrit as OP- this will be continued - continue to monitor and will resume procrit on discharge 5. Metabolic bone disease - Secondary hyperPTH and s/p parathyroidectomy. Phos normal. Corrected calcium low - continue home calcitriol and calcium carbonate- advised her to make sure she brings her home meds to her next OP appt  Subjective:   Patient resting comfortably in bed with improvement since yesterday. Diarrhea and vomiting has resolved. Still mild nausea. Some sweating in bed overnight but remains afebrile. Overall aches and abdominal tenderness improving.    Objective:   BP 127/86  Pulse 86  Temp(Src) 98.5 F (36.9 C) (Oral)  Resp 18  Ht 5' 9.5" (1.765 m)  Wt 93.622 kg (206 lb 6.4 oz)  BMI 30.05 kg/m2  SpO2 98%  Intake/Output Summary (Last 24 hours) at 06/30/13 0805 Last data filed at 06/30/13 0700  Gross per 24 hour  Intake 2858.75 ml  Output       0 ml  Net 2858.75 ml   Weight change:   Physical Exam: General appearance - alert, well appearing, and in no distress Eyes - pupils equal and reactive, extraocular eye movements intact Mouth - mucous membranes moist, pharynx normal without lesions Chest - clear to auscultation, no wheezes, rales or rhonchi, symmetric air entry Heart - normal rate, regular rhythm, normal S1, S2, no murmurs, rubs, clicks or gallops Abdomen - soft, positive BS, mild TTP RLQ but improved from previous exam, palpable transplant in RLQ Extremities - trace pedal edema Raven/L feet  Skin - normal coloration and turgor, no rashes, no suspicious skin lesions noted  Imaging: No results found.  Labs: BMET  Recent Labs Lab 06/29/13 0028 06/29/13 0834 06/30/13 0545  NA 142 138 138  K 3.4* 3.4* 3.2*  CL 108 103 104  CO2 $Re'21 22 22  'DxZ$ GLUCOSE 127* 122* 99  BUN $Re'16 16 17  'EgL$ CREATININE 2.07* 2.27* 2.11*  CALCIUM 6.2* 6.6* 6.7*  PHOS  --   --  3.0   CBC  Recent Labs Lab 06/29/13 0028 06/29/13 0834 06/30/13 0545  WBC 10.1 11.9* 10.2  NEUTROABS 8.3* 9.3*  --   HGB 8.0* 8.3* 7.7*  HCT 24.6* 26.3* 23.7*  MCV 76.4* 76.5* 75.2*  PLT 191 229  240 227    Medications:    . antiseptic oral rinse  15 mL Mouth Rinse BID  . calcitRIOL  0.5 mcg Oral BID  . calcium carbonate  3 tablet  Oral TID  . cholecalciferol  1,000 Units Oral Daily  . heparin  5,000 Units Subcutaneous Q8H  . influenza vac split quadrivalent PF  0.5 mL Intramuscular Tomorrow-1000  . mycophenolate  1,000 mg Oral BID  . pantoprazole  40 mg Oral BID  . piperacillin-tazobactam (ZOSYN)  IV  3.375 g Intravenous Q8H  . pneumococcal 23 valent vaccine  0.5 mL Intramuscular Tomorrow-1000  . potassium chloride  20 mEq Oral Once  . predniSONE  5 mg Oral Q breakfast  . tacrolimus  5 mg Oral BID    Dwyane Dee, MS4 06/30/2013, 8:05 AM   I have seen and evaluated this patient and agree with the subjective as documented in the medical student note  above. Additionally:  Gen: NAD Heart: RRR Lungs: CTAB, NWOB Abd: soft, nontender to palpation, even in RLQ. Ext: calves nontender Neuro: grossly nonfocal, speech intact  Creatinine mildly improved this morning. No UOP quantitative charted yesterday but it appears she is urinating. Given potassium already today. Urine culture with no growth, but should still treat as UTI given her presentation and immunocompromised status. She reports she is going to be discharged today. Should follow up with [facility] in 1-2 weeks.  Chrisandra Netters, MD Family Medicine PGY-2 Renal Service Resident   Patient seen and examined, agree with above note with above modifications.  Feeling clinicaly better, creatinine is at her baseline.  I am Ok with her going home with levaquin course and she will follow up with Dr. Jimmy Footman next month.  Her calcium and hgb are both low, she is on repletion and procrit as OP and those will be continued.   Corliss Parish, MD 06/30/2013

## 2013-06-30 NOTE — Progress Notes (Signed)
Agree with above note.   Harolyn Rutherford, PharmD Clinical Pharmacist - Resident Pager: [phone] Pharmacy: [phone] 06/30/2013 10:51 AM

## 2013-06-30 NOTE — Progress Notes (Signed)
ANTIBIOTIC CONSULT NOTE - INITIAL  Pharmacy Consult for Zosyn Indication: UTI  Allergies  Allergen Reactions  . Nickel Itching and Rash    Only where touched    Patient Measurements: Height: 5' 9.5" (176.5 cm) Weight: 206 lb 6.4 oz (93.622 kg) IBW/kg (Calculated) : 67.35  Vital Signs: Temp: 98.5 F (36.9 C) (09/26 0500) Temp src: Oral (09/26 0500) BP: 127/86 mmHg (09/26 0500) Pulse Rate: 86 (09/26 0500) Intake/Output from previous day: 09/25 0701 - 09/26 0700 In: 2858.8 [P.O.:840; I.V.:1868.8; IV Piggyback:150] Out: -  Intake/Output from this shift:    Labs (at [facility]): WBC 11.6 Hgb 8.8 Hct 28.1 Plt 221  SCr 1.96     Recent Labs  06/29/13 0028 06/29/13 0834 06/30/13 0545  WBC 10.1 11.9* 10.2  HGB 8.0* 8.3* 7.7*  PLT 191 229  240 227  CREATININE 2.07* 2.27* 2.11*   Estimated Creatinine Clearance: 46.2 ml/min (by C-G formula based on Cr of 2.11). No results found for this basename: Letta Median, VANCORANDOM, [place], [place], [place], [place], TOBRAPEAK, TOBRARND, AMIKACINPEAK, AMIKACINTROU, AMIKACIN,  in the last 72 hours   Microbiology: Recent Results (from the past 720 hour(s))  URINE CULTURE     Status: None   Collection Time    06/29/13  3:33 AM      Result Value Range Status   Specimen Description URINE, RANDOM   Final   Special Requests CX ADDED AT [place] ON [id number]   Final   Culture  Setup Time     Final   Value: 06/29/2013 00:54     Performed at [place]     Final   Value: 5,000 COLONIES/ML     Performed at [facility]   Culture     Final   Value: INSIGNIFICANT GROWTH     Performed at [facility]   Report Status 06/30/2013 FINAL   Final  CLOSTRIDIUM DIFFICILE BY PCR     Status: None   Collection Time    06/29/13  3:45 AM      Result Value Range Status   C difficile by pcr NEGATIVE  NEGATIVE Final    Medical History: Past Medical History  Diagnosis Date  .  Hypertension   . Poor appetite   . 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 GERD (gastroesophageal reflux disease) Dec. 2010    ulcerative esophagitis per EGD   . Secondary hyperparathyroidism     s/p parathyroidectomy with autotransplantation to left arm per Dr. Baker Pierini on 08/04/11  . Streptococcal peritonitis  Sept. 2012    Medications:  Norvasc  Protonix  Prograf  Prednisone  Calcitriol  Diltiazem  Myfortic    Assessment: 32 yo female presented with fever/chills, N/V, possible UTI/pyelonephritis. Now afebrile, N/V resolved. Urine culture: insignificant growth (5,000 colonies/mL) but will continue to cover due to hx of severe UTI and immunocompromised state, s/p renal transplant (Sept '14).   Plan:  1. D/C Zosyn 3.375 g IV q8h  2. Levaquin $RemoveBef'250mg'[id number]$  PO Qday - start today, continue for 8 days outpatient (total 10 day course)  Demichael Traum 06/30/2013,10:30 AM

## 2013-07-02 LAB — TACROLIMUS LEVEL: Tacrolimus (FK506) - LabCorp: 7.5 ng/mL

## 2013-07-26 DIAGNOSIS — Z940 Kidney transplant status: Secondary | ICD-10-CM | POA: Diagnosis not present

## 2013-07-26 DIAGNOSIS — D649 Anemia, unspecified: Secondary | ICD-10-CM | POA: Diagnosis not present

## 2013-07-26 DIAGNOSIS — E559 Vitamin D deficiency, unspecified: Secondary | ICD-10-CM | POA: Diagnosis not present

## 2013-07-26 DIAGNOSIS — D899 Disorder involving the immune mechanism, unspecified: Secondary | ICD-10-CM | POA: Diagnosis not present

## 2013-07-26 DIAGNOSIS — I10 Essential (primary) hypertension: Secondary | ICD-10-CM | POA: Diagnosis not present

## 2013-08-08 DIAGNOSIS — I129 Hypertensive chronic kidney disease with stage 1 through stage 4 chronic kidney disease, or unspecified chronic kidney disease: Secondary | ICD-10-CM | POA: Diagnosis not present

## 2013-08-08 DIAGNOSIS — N183 Chronic kidney disease, stage 3 unspecified: Secondary | ICD-10-CM | POA: Diagnosis not present

## 2013-08-08 DIAGNOSIS — N189 Chronic kidney disease, unspecified: Secondary | ICD-10-CM | POA: Diagnosis not present

## 2013-08-08 DIAGNOSIS — Z940 Kidney transplant status: Secondary | ICD-10-CM | POA: Diagnosis not present

## 2013-08-29 ENCOUNTER — Inpatient Hospital Stay (HOSPITAL_COMMUNITY)
Admission: AD | Admit: 2013-08-29 | Discharge: 2013-09-01 | DRG: 866 | Disposition: A | Discharge status: Home / Self Care | Payer: Medicare Other | Source: Other Acute Inpatient Hospital | Attending: Internal Medicine | Admitting: Internal Medicine

## 2013-08-29 DIAGNOSIS — Z87891 Personal history of nicotine dependence: Secondary | ICD-10-CM | POA: Diagnosis not present

## 2013-08-29 DIAGNOSIS — R509 Fever, unspecified: Secondary | ICD-10-CM | POA: Diagnosis present

## 2013-08-29 DIAGNOSIS — Z8249 Family history of ischemic heart disease and other diseases of the circulatory system: Secondary | ICD-10-CM

## 2013-08-29 DIAGNOSIS — N189 Chronic kidney disease, unspecified: Secondary | ICD-10-CM | POA: Diagnosis not present

## 2013-08-29 DIAGNOSIS — Z833 Family history of diabetes mellitus: Secondary | ICD-10-CM | POA: Diagnosis not present

## 2013-08-29 DIAGNOSIS — N179 Acute kidney failure, unspecified: Secondary | ICD-10-CM

## 2013-08-29 DIAGNOSIS — Z79899 Other long term (current) drug therapy: Secondary | ICD-10-CM

## 2013-08-29 DIAGNOSIS — N186 End stage renal disease: Secondary | ICD-10-CM | POA: Diagnosis not present

## 2013-08-29 DIAGNOSIS — N058 Unspecified nephritic syndrome with other morphologic changes: Secondary | ICD-10-CM | POA: Diagnosis present

## 2013-08-29 DIAGNOSIS — IMO0002 Reserved for concepts with insufficient information to code with codable children: Secondary | ICD-10-CM | POA: Diagnosis not present

## 2013-08-29 DIAGNOSIS — N2581 Secondary hyperparathyroidism of renal origin: Secondary | ICD-10-CM | POA: Diagnosis present

## 2013-08-29 DIAGNOSIS — N390 Urinary tract infection, site not specified: Secondary | ICD-10-CM | POA: Diagnosis not present

## 2013-08-29 DIAGNOSIS — D631 Anemia in chronic kidney disease: Secondary | ICD-10-CM | POA: Diagnosis present

## 2013-08-29 DIAGNOSIS — K219 Gastro-esophageal reflux disease without esophagitis: Secondary | ICD-10-CM | POA: Diagnosis present

## 2013-08-29 DIAGNOSIS — I10 Essential (primary) hypertension: Secondary | ICD-10-CM | POA: Diagnosis not present

## 2013-08-29 DIAGNOSIS — I951 Orthostatic hypotension: Secondary | ICD-10-CM | POA: Diagnosis not present

## 2013-08-29 DIAGNOSIS — Z940 Kidney transplant status: Secondary | ICD-10-CM | POA: Diagnosis not present

## 2013-08-29 DIAGNOSIS — B9789 Other viral agents as the cause of diseases classified elsewhere: Secondary | ICD-10-CM | POA: Diagnosis present

## 2013-08-30 ENCOUNTER — Encounter (HOSPITAL_COMMUNITY): Payer: Self-pay | Admitting: *Deleted

## 2013-08-30 DIAGNOSIS — Z940 Kidney transplant status: Secondary | ICD-10-CM

## 2013-08-30 DIAGNOSIS — R509 Fever, unspecified: Secondary | ICD-10-CM | POA: Diagnosis present

## 2013-08-30 LAB — URINALYSIS, ROUTINE W REFLEX MICROSCOPIC
Glucose, UA: NEGATIVE mg/dL
Hgb urine dipstick: NEGATIVE
Ketones, ur: NEGATIVE mg/dL
Leukocytes, UA: NEGATIVE
Protein, ur: 100 mg/dL — AB
pH: 5 (ref 5.0–8.0)

## 2013-08-30 LAB — CBC WITH DIFFERENTIAL/PLATELET
Basophils Absolute: 0 10*3/uL (ref 0.0–0.1)
Eosinophils Relative: 0 % (ref 0–5)
HCT: 26.4 % — ABNORMAL LOW (ref 36.0–46.0)
Lymphocytes Relative: 9 % — ABNORMAL LOW (ref 12–46)
Lymphs Abs: 1 10*3/uL (ref 0.7–4.0)
MCHC: 32.2 g/dL (ref 30.0–36.0)
MCV: 76.5 fL — ABNORMAL LOW (ref 78.0–100.0)
Monocytes Relative: 10 % (ref 3–12)
Neutro Abs: 9.2 10*3/uL — ABNORMAL HIGH (ref 1.7–7.7)
RBC: 3.45 MIL/uL — ABNORMAL LOW (ref 3.87–5.11)
RDW: 16 % — ABNORMAL HIGH (ref 11.5–15.5)
WBC: 11.3 10*3/uL — ABNORMAL HIGH (ref 4.0–10.5)

## 2013-08-30 LAB — COMPREHENSIVE METABOLIC PANEL
ALT: 5 U/L (ref 0–35)
Albumin: 3 g/dL — ABNORMAL LOW (ref 3.5–5.2)
Alkaline Phosphatase: 40 U/L (ref 39–117)
BUN: 12 mg/dL (ref 6–23)
Calcium: 7.4 mg/dL — ABNORMAL LOW (ref 8.4–10.5)
Creatinine, Ser: 1.94 mg/dL — ABNORMAL HIGH (ref 0.50–1.10)
GFR calc Af Amer: 38 mL/min — ABNORMAL LOW (ref >90)
Glucose, Bld: 126 mg/dL — ABNORMAL HIGH (ref 70–99)
Potassium: 3.3 mEq/L — ABNORMAL LOW (ref 3.5–5.1)
Total Protein: 6.4 g/dL (ref 6.0–8.3)

## 2013-08-30 LAB — URINE MICROSCOPIC-ADD ON

## 2013-08-30 LAB — LACTIC ACID, PLASMA: Lactic Acid, Venous: 1.5 mmol/L (ref 0.5–2.2)

## 2013-08-30 LAB — PREGNANCY, URINE: Preg Test, Ur: NEGATIVE

## 2013-08-30 MED ORDER — PREDNISONE 5 MG PO TABS
5.0000 mg | ORAL_TABLET | Freq: Every day | ORAL | Status: DC
  Filled 2013-08-30 (×2): qty 1

## 2013-08-30 MED ORDER — DILTIAZEM HCL 30 MG PO TABS
30.0000 mg | ORAL_TABLET | Freq: Two times a day (BID) | ORAL | Status: DC
  Administered 2013-08-30 – 2013-09-01 (×6): 30 mg via ORAL
  Filled 2013-08-30 (×7): qty 1

## 2013-08-30 MED ORDER — MYCOPHENOLATE MOFETIL 250 MG PO CAPS
1000.0000 mg | ORAL_CAPSULE | Freq: Two times a day (BID) | ORAL | Status: DC
  Administered 2013-08-30 – 2013-09-01 (×5): 1000 mg via ORAL
  Filled 2013-08-30 (×6): qty 4

## 2013-08-30 MED ORDER — PREDNISONE 20 MG PO TABS
20.0000 mg | ORAL_TABLET | Freq: Every day | ORAL | Status: AC
  Administered 2013-08-30 – 2013-08-31 (×2): 20 mg via ORAL
  Filled 2013-08-30 (×2): qty 1

## 2013-08-30 MED ORDER — ONDANSETRON HCL 4 MG/2ML IJ SOLN: 4.0000 mg | Freq: Four times a day (QID) | INTRAMUSCULAR | Status: DC | PRN

## 2013-08-30 MED ORDER — VITAMIN D3 25 MCG (1000 UNIT) PO TABS
1000.0000 [IU] | ORAL_TABLET | Freq: Every day | ORAL | Status: DC
  Administered 2013-08-30 – 2013-09-01 (×3): 1000 [IU] via ORAL
  Filled 2013-08-30 (×3): qty 1

## 2013-08-30 MED ORDER — ACETAMINOPHEN 325 MG PO TABS
650.0000 mg | ORAL_TABLET | Freq: Four times a day (QID) | ORAL | Status: DC | PRN
  Administered 2013-09-01: 650 mg via ORAL
  Filled 2013-08-30: qty 2

## 2013-08-30 MED ORDER — SODIUM CHLORIDE 0.9 % IV SOLN
INTRAVENOUS | Status: AC
  Administered 2013-08-30 – 2013-08-31 (×3): via INTRAVENOUS

## 2013-08-30 MED ORDER — MORPHINE SULFATE 2 MG/ML IJ SOLN
1.0000 mg | INTRAMUSCULAR | Status: DC | PRN
  Administered 2013-08-30 – 2013-08-31 (×3): 1 mg via INTRAVENOUS
  Filled 2013-08-30 (×3): qty 1

## 2013-08-30 MED ORDER — CALCIUM CARBONATE ANTACID 500 MG PO CHEW
3.0000 | CHEWABLE_TABLET | Freq: Three times a day (TID) | ORAL | Status: DC
  Administered 2013-08-30 – 2013-09-01 (×7): 600 mg via ORAL
  Filled 2013-08-30 (×9): qty 3

## 2013-08-30 MED ORDER — ACETAMINOPHEN 650 MG RE SUPP: 650.0000 mg | Freq: Four times a day (QID) | RECTAL | Status: DC | PRN

## 2013-08-30 MED ORDER — SODIUM CHLORIDE 0.9 % IJ SOLN
3.0000 mL | Freq: Two times a day (BID) | INTRAMUSCULAR | Status: DC
  Administered 2013-08-30: 3 mL via INTRAVENOUS

## 2013-08-30 MED ORDER — HYDROCORTISONE SOD SUCCINATE 100 MG IJ SOLR
50.0000 mg | Freq: Once | INTRAMUSCULAR | Status: AC
  Administered 2013-08-30: 50 mg via INTRAVENOUS
  Filled 2013-08-30: qty 1

## 2013-08-30 MED ORDER — CALCITRIOL 0.5 MCG PO CAPS
0.5000 ug | ORAL_CAPSULE | Freq: Two times a day (BID) | ORAL | Status: DC
  Administered 2013-08-30 – 2013-09-01 (×5): 0.5 ug via ORAL
  Filled 2013-08-30 (×7): qty 1

## 2013-08-30 MED ORDER — POTASSIUM CHLORIDE CRYS ER 20 MEQ PO TBCR
60.0000 meq | EXTENDED_RELEASE_TABLET | Freq: Once | ORAL | Status: AC
  Administered 2013-08-30: 60 meq via ORAL

## 2013-08-30 MED ORDER — HYDROCODONE-ACETAMINOPHEN 5-325 MG PO TABS
1.0000 | ORAL_TABLET | Freq: Three times a day (TID) | ORAL | Status: DC | PRN
  Administered 2013-08-30: 1 via ORAL
  Filled 2013-08-30: qty 1

## 2013-08-30 MED ORDER — MYCOPHENOLATE MOFETIL 250 MG PO CAPS
1000.0000 mg | ORAL_CAPSULE | Freq: Two times a day (BID) | ORAL | Status: DC
  Administered 2013-08-30: 1000 mg via ORAL
  Filled 2013-08-30 (×3): qty 4

## 2013-08-30 MED ORDER — VANCOMYCIN HCL 10 G IV SOLR
1250.0000 mg | INTRAVENOUS | Status: DC
  Administered 2013-08-30 – 2013-09-01 (×3): 1250 mg via INTRAVENOUS
  Filled 2013-08-30 (×4): qty 1250

## 2013-08-30 MED ORDER — TACROLIMUS 1 MG PO CAPS
5.0000 mg | ORAL_CAPSULE | Freq: Two times a day (BID) | ORAL | Status: DC
  Administered 2013-08-30: 5 mg via ORAL
  Filled 2013-08-30 (×3): qty 5

## 2013-08-30 MED ORDER — PIPERACILLIN-TAZOBACTAM 3.375 G IVPB
3.3750 g | Freq: Three times a day (TID) | INTRAVENOUS | Status: DC
  Administered 2013-08-30 – 2013-09-01 (×7): 3.375 g via INTRAVENOUS
  Filled 2013-08-30 (×10): qty 50

## 2013-08-30 MED ORDER — ONDANSETRON HCL 4 MG PO TABS: 4.0000 mg | ORAL_TABLET | Freq: Four times a day (QID) | ORAL | Status: DC | PRN

## 2013-08-30 MED ORDER — PANTOPRAZOLE SODIUM 40 MG PO TBEC
40.0000 mg | DELAYED_RELEASE_TABLET | Freq: Two times a day (BID) | ORAL | Status: DC
  Administered 2013-08-30 – 2013-09-01 (×6): 40 mg via ORAL
  Filled 2013-08-30 (×6): qty 1

## 2013-08-30 MED ORDER — METOPROLOL TARTRATE 50 MG PO TABS
50.0000 mg | ORAL_TABLET | Freq: Every evening | ORAL | Status: DC
Start: 2013-08-30 — End: 2013-08-30
  Filled 2013-08-30: qty 1

## 2013-08-30 MED ORDER — TACROLIMUS 1 MG PO CAPS
5.0000 mg | ORAL_CAPSULE | Freq: Two times a day (BID) | ORAL | Status: DC
  Administered 2013-08-30 – 2013-09-01 (×5): 5 mg via ORAL
  Filled 2013-08-30 (×6): qty 5

## 2013-08-30 MED ORDER — HEPARIN SODIUM (PORCINE) 5000 UNIT/ML IJ SOLN
5000.0000 [IU] | Freq: Three times a day (TID) | INTRAMUSCULAR | Status: DC
  Administered 2013-08-30 – 2013-09-01 (×7): 5000 [IU] via SUBCUTANEOUS
  Filled 2013-08-30 (×10): qty 1

## 2013-08-30 MED ORDER — EPOETIN ALFA 10000 UNIT/ML IJ SOLN: 30000.0000 [IU] | INTRAMUSCULAR | Status: DC

## 2013-08-30 NOTE — Progress Notes (Signed)
ANTIBIOTIC CONSULT NOTE - INITIAL  Pharmacy Consult for Vancomycin/Zosyn Indication: rule out sepsis  Allergies  Allergen Reactions  . Nickel Itching and Rash    Only where touched   Patient Measurements: Height: 5\' 9"  (175.3 cm) Weight: 205 lb 4 oz (93.1 kg) IBW/kg (Calculated) : 66.2  Vital Signs: Temp: 99.5 F (37.5 C) (11/25 2356) Temp src: Oral (11/25 2356) BP: 100/59 mmHg (11/25 2356) Pulse Rate: 100 (11/25 2356)  Labs: No results found for this basename: WBC, HGB, PLT, LABCREA, CREATININE,  in the last 72 hours Estimated Creatinine Clearance: 45.7 ml/min (by C-G formula based on Cr of 2.11).  Medical History: Past Medical History  Diagnosis Date  . Hypertension   . Poor appetite   . 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 GERD (gastroesophageal reflux disease) Dec. 2010    ulcerative esophagitis per EGD   . Secondary hyperparathyroidism     s/p parathyroidectomy with autotransplantation to left arm per Dr. Baker Pierini on 08/04/11  . Streptococcal peritonitis  Sept. 2012    Assessment: 32 y/o F transfer for [facility]. Patient s/p renal transplant (was on PD before the transplant)  Goal of Therapy:  Vancomycin trough level 15-20 mcg/ml  Plan:  -Vancomycin 1250 mg IV q24h -Zosyn 3.375G IV q8h to be infused over 4 hours -Trend WBC, temp, renal function  -DC vancomycin soon if possible given kidney transplant  Thank you for allowing me to take part in this patient's care,  Narda Bonds, PharmD Clinical Pharmacist Phone: [phone] Pager: [phone] 08/30/2013 2:52 AM

## 2013-08-30 NOTE — H&P (Addendum)
[facility] History and Physical  Raven Walker KPT:[id number] DOB: 12-Jun-1981 DOA: 08/29/2013  Referring physician: ER physician. Patient was transferred from [facility]. PCP: Ala Bent, MD  Specialists: Nephrologist Dr. Jimmy Footman.  Chief Complaint: Fever.  HPI: Raven Walker is a 32 y.o. female with history of renal transplant, chronic anemia and hypertension was experiencing fever and chills over the last 2 days. Patient did not have any chest pain or shortness of breath productive cough abdominal pain diarrhea dysuria or any discharges. Patient did not have any visual symptoms or headache. In the ER patient was found to be febrile with temperatures around 104F. Blood cultures were sent. And on-call nephrologist was consulted and was revised patient be transferred to [facility] for further management as patient was well known to the nephrology team. Influenza panel and strep throat were negative. Chest x-ray did not show anything acute Patient's UA over there showed negative for leukocyte esterase and nitrites but showed moderate bacteria with loss of epithelial cells. Further labs were not obtained because patient was a difficult stick. Presently patient is alert awake oriented and not in distress.  Review of Systems: As presented in the history of presenting illness, rest negative.  Past Medical History  Diagnosis Date  . Hypertension   . Poor appetite   . 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 GERD (gastroesophageal reflux disease) Dec. 2010    ulcerative esophagitis per EGD   . Secondary hyperparathyroidism     s/p parathyroidectomy with autotransplantation to left arm per Dr. Baker Pierini on 08/04/11  . Streptococcal peritonitis  Sept. 2012   Past Surgical History  Procedure Laterality Date  . Arm graphs      three - done at [facility]  . Parathyroidectomy  08/05/11    with graft of parathyroid into  left upper arm.   Social History:  reports that she has quit smoking. She has never used smokeless tobacco. She reports that she drinks alcohol. She reports that she does not use illicit drugs. Where does patient live home. Can patient participate in ADLs? Yes.  Allergies  Allergen Reactions  . Nickel Itching and Rash    Only where touched    Family History:  Family History  Problem Relation Age of Onset  . Diabetes type I Mother   . Hypertension Mother   . Diabetes type I Father   . Hypertension Father       Prior to Admission medications   Medication Sig Start Date End Date Taking? Authorizing Provider  acetaminophen (TYLENOL) 325 MG tablet Take 650 mg by mouth every 6 (six) hours as needed. For pain     Historical Provider, MD  amLODipine (NORVASC) 5 MG tablet Take 5 mg by mouth every morning. 06/08/13   Historical Provider, MD  calcitRIOL (ROCALTROL) 0.5 MCG capsule Take 0.5 mcg by mouth 2 (two) times daily. 04/25/13   Historical Provider, MD  calcium carbonate (TUMS - DOSED IN MG ELEMENTAL CALCIUM) 500 MG chewable tablet Chew 3 tablets by mouth 3 (three) times daily.    Historical Provider, MD  cholecalciferol (VITAMIN D) 400 UNITS TABS tablet Take 1,000 Units by mouth daily.    Historical Provider, MD  diltiazem (CARDIZEM) 30 MG tablet Take 30 mg by mouth 2 (two) times daily. 03/27/13   Historical Provider, MD  epoetin alfa (EPOGEN,PROCRIT) 17001 UNIT/ML injection Inject 30,000 Units into the skin every 21 ( twenty-one) days.  Historical Provider, MD  furosemide (LASIX) 40 MG tablet Take 40 mg by mouth daily. 06/21/13   Historical Provider, MD  HYDROcodone-acetaminophen (VICODIN) 5-500 MG per tablet Take 1 tablet by mouth every 8 (eight) hours as needed. 06/30/13   Thurnell Lose, MD  levofloxacin (LEVAQUIN) 250 MG tablet Take 1 tablet (250 mg total) by mouth daily. 06/30/13   Thurnell Lose, MD  metoprolol (LOPRESSOR) 50 MG tablet Take 50 mg by mouth every evening. 06/21/13    Historical Provider, MD  mycophenolate (CELLCEPT) 250 MG capsule Take 1,000 mg by mouth 2 (two) times daily.    Historical Provider, MD  pantoprazole (PROTONIX) 40 MG tablet Take 40 mg by mouth 2 (two) times daily.     Historical Provider, MD  predniSONE (DELTASONE) 5 MG tablet Take 5 mg by mouth daily.    Historical Provider, MD  PROGRAF 1 MG capsule Take 5 mg by mouth 2 (two) times daily. 06/21/13   Historical Provider, MD    Physical Exam: Filed Vitals:   08/29/13 2356  BP: 100/59  Pulse: 100  Temp: 99.5 F (37.5 C)  TempSrc: Oral  Resp: 18  Height: $Remove'5\' 9"'XzxMYKE$  (1.753 m)  Weight: 93.1 kg (205 lb 4 oz)  SpO2: 100%     General:  Well-developed well-nourished.  Eyes: Anicteric no pallor.  ENT: No discharge from ears eyes nose mouth.  Neck: No mass felt.  Cardiovascular: S1-S2 heard.  Respiratory: No rhonchi or crepitations.  Abdomen: Soft nontender bowel sounds present.  Skin: No rash.  Musculoskeletal: No edema.  Psychiatric: Appears normal.  Neurologic: Alert awake oriented to time place and person. Moves all extremities.  Labs on Admission:  Basic Metabolic Panel: No results found for this basename: NA, K, CL, CO2, GLUCOSE, BUN, CREATININE, CALCIUM, MG, PHOS,  in the last 168 hours Liver Function Tests: No results found for this basename: AST, ALT, ALKPHOS, BILITOT, PROT, ALBUMIN,  in the last 168 hours No results found for this basename: LIPASE, AMYLASE,  in the last 168 hours No results found for this basename: AMMONIA,  in the last 168 hours CBC: No results found for this basename: WBC, NEUTROABS, HGB, HCT, MCV, PLT,  in the last 168 hours Cardiac Enzymes: No results found for this basename: CKTOTAL, CKMB, CKMBINDEX, TROPONINI,  in the last 168 hours  BNP (last 3 results) No results found for this basename: PROBNP,  in the last 8760 hours CBG: No results found for this basename: GLUCAP,  in the last 168 hours  Radiological Exams on Admission: No results  found.   Assessment/Plan Principal Problem:   Fever Active Problems:   History of renal transplant   1. Fever with possible developing sepsis - source is not clear. Check blood cultures UA urine cultures. For now I am keeping patient on empiric antibiotics vancomycin and Zosyn. Patient also had blood cultures drawn at [facility]. 2. History of renal transplant - patient's baseline creatinine is around 2. Presently all labs are pending. Continue with gentle hydration. Continue immunosuppressants. One dose of stress dose steroids given. 3. Chronic anemia - follow CBC. 4. History of hypertension - continue metoprolol and Cardizem but we will hold off amlodipine for now and keep patient on when necessary IV hydralazine.    Code Status: Full code.  Family Communication: None.  Disposition Plan: Admit to inpatient.    Lular Letson N. [facility] Pager [phone].  If 7PM-7AM, please contact night-coverage [email or web address] Password [email or web address] 08/30/2013, 1:55 AM

## 2013-08-30 NOTE — Consult Note (Signed)
[facility] Renal Consultation Note  Requesting MD: Hal Hope Indication for Consultation:  Fever in a renal transplant patient  HPI:  Raven Walker is a 32 y.o. female with PMhx of congenital unilateral renal agenesis and HTN which led to ESRD. She was on peritoneal dialysis from 2009 until she was fortunate enough to undergo a cadaveric renal transplant at [facility] in September of 2013. It sounds as if she initially did well. She was noted to have "illness" in June of 2014 and was eventually sent to [facility] and had a biopsy of her transplanted kidney which was consistent with interstitial nephritis and Prograf toxicity so she has had some renal insufficiency. Patient is also status post brief hospitalization in September which was noted to have a febrile illness which appeared to be consistent with pyelonephritis of her transplanted kidney. During that hospitalization her creatinine remained just above 2. Patient states that when she was discharged from the hospital in September she has been well until yesterday when she developed a high fever. She called the [place] kidney office and was instructed to use Tylenol  and arrangements were being made for a lab draw. However, when patient felt temperature going up again she just presented to [place] she was noted to have a temperature of 104. She's had no localizing symptoms. Her white blood count is 11.3. Her urinalysis actually looks pretty clean. Blood cultures were drawn. She was given a dose of Zosyn at [facility] and is currently receiving vancomycin here. There is been no chest x-ray done but again patient does not have any localizing symptoms. She states that her son does have a cold. She also noted dizziness.  Creatinine, Ser  Date/Time Value Range Status  08/30/2013  1:41 AM 1.94* 0.50 - 1.10 mg/dL Final  06/30/2013  5:45 AM 2.11* 0.50 - 1.10 mg/dL Final  06/29/2013  8:34 AM 2.27* 0.50 - 1.10 mg/dL Final   06/29/2013 12:28 AM 2.07* 0.50 - 1.10 mg/dL Final  08/23/2011  6:10 AM 11.79* 0.50 - 1.10 mg/dL Final  08/22/2011  8:15 AM 12.66* 0.50 - 1.10 mg/dL Final  08/21/2011  6:00 AM 14.04* 0.50 - 1.10 mg/dL Final  08/20/2011  3:34 AM 15.16* 0.50 - 1.10 mg/dL Final  08/19/2011  4:05 PM 14.92* 0.50 - 1.10 mg/dL Final  08/19/2011  7:04 AM 14.51* 0.50 - 1.10 mg/dL Final  08/19/2011  7:04 AM 14.61* 0.50 - 1.10 mg/dL Final  08/19/2011  5:05 AM 14.21* 0.50 - 1.10 mg/dL Final  08/19/2011  4:00 AM 13.97* 0.50 - 1.10 mg/dL Final  08/08/2011  7:00 AM 12.44* 0.50 - 1.10 mg/dL Final  08/07/2011  1:15 PM 12.21* 0.50 - 1.10 mg/dL Final  08/06/2011  6:35 PM 13.12* 0.50 - 1.10 mg/dL Final  08/06/2011  5:00 AM 12.86* 0.50 - 1.10 mg/dL Final  08/05/2011  4:45 PM 12.40* 0.50 - 1.10 mg/dL Final  08/05/2011  5:00 AM 12.56* 0.50 - 1.10 mg/dL Final  08/04/2011  9:29 PM 12.56* 0.50 - 1.10 mg/dL Final  08/04/2011  6:10 AM 11.91* 0.50 - 1.10 mg/dL Final  07/02/2011 11:04 AM 10.29* 0.50 - 1.10 mg/dL Final  07/01/2011  6:05 AM 11.37* 0.50 - 1.10 mg/dL Final  06/30/2011  6:32 AM 12.20* 0.50 - 1.10 mg/dL Final  06/29/2011  5:34 AM 13.62* 0.50 - 1.10 mg/dL Final  06/28/2011  6:30 AM 14.48* 0.50 - 1.10 mg/dL Final  06/26/2011  5:07 AM 20.37* 0.50 - 1.10 mg/dL Final  06/25/2011  5:00 AM 21.24* 0.50 -  1.10 mg/dL Final  06/24/2011 12:35 PM 21.23* 0.50 - 1.10 mg/dL Final  09/08/2009  4:55 AM 15.30* 0.4 - 1.2 mg/dL Final  09/07/2009  8:00 AM 17.09* 0.4 - 1.2 mg/dL Final  09/06/2009  4:50 AM 18.75* 0.4 - 1.2 mg/dL Final  09/05/2009  5:03 PM 19.81* 0.4 - 1.2 mg/dL Final  09/05/2009  6:10 AM 20.78* 0.4 - 1.2 mg/dL Final  09/04/2009  1:52 PM 22.30* 0.4 - 1.2 mg/dL Final  07/16/2009  6:55 PM 19.06* 0.4 - 1.2 mg/dL Final  06/12/2009  7:04 AM 7.84* 0.4 - 1.2 mg/dL Final  04/17/2009  4:03 PM 7.36* 0.4 - 1.2 mg/dL Final  02/01/2009  2:56 PM 7.27* 0.40 - 1.20 mg/dL Final     PMHx:   Past Medical History  Diagnosis Date  . Hypertension   . Poor  appetite   . 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 GERD (gastroesophageal reflux disease) Dec. 2010    ulcerative esophagitis per EGD   . Secondary hyperparathyroidism     s/p parathyroidectomy with autotransplantation to left arm per Dr. Baker Pierini on 08/04/11  . Streptococcal peritonitis  Sept. 2012    Past Surgical History  Procedure Laterality Date  . Arm graphs      three - done at [facility]  . Parathyroidectomy  08/05/11    with graft of parathyroid into left upper arm.    Family Hx:  Family History  Problem Relation Age of Onset  . Diabetes type I Mother   . Hypertension Mother   . Diabetes type I Father   . Hypertension Father     Social History:  reports that she has quit smoking. She has never used smokeless tobacco. She reports that she drinks alcohol. She reports that she does not use illicit drugs.  Allergies:  Allergies  Allergen Reactions  . Nickel Itching and Rash    Only where touched    Medications: Prior to Admission medications   Medication Sig Start Date End Date Taking? Authorizing Provider  acetaminophen (TYLENOL) 325 MG tablet Take 650 mg by mouth every 6 (six) hours as needed. For pain     Historical Provider, MD  amLODipine (NORVASC) 5 MG tablet Take 5 mg by mouth every morning. 06/08/13   Historical Provider, MD  calcitRIOL (ROCALTROL) 0.5 MCG capsule Take 0.5 mcg by mouth 2 (two) times daily. 04/25/13   Historical Provider, MD  calcium carbonate (TUMS - DOSED IN MG ELEMENTAL CALCIUM) 500 MG chewable tablet Chew 3 tablets by mouth 3 (three) times daily.    Historical Provider, MD  cholecalciferol (VITAMIN D) 400 UNITS TABS tablet Take 1,000 Units by mouth daily.    Historical Provider, MD  diltiazem (CARDIZEM) 30 MG tablet Take 30 mg by mouth 2 (two) times daily. 03/27/13   Historical Provider, MD  epoetin alfa (EPOGEN,PROCRIT) 67893 UNIT/ML injection Inject 30,000 Units into the skin every 21 (  twenty-one) days.    Historical Provider, MD  furosemide (LASIX) 40 MG tablet Take 40 mg by mouth daily. 06/21/13   Historical Provider, MD  HYDROcodone-acetaminophen (VICODIN) 5-500 MG per tablet Take 1 tablet by mouth every 8 (eight) hours as needed. 06/30/13   Thurnell Lose, MD  levofloxacin (LEVAQUIN) 250 MG tablet Take 1 tablet (250 mg total) by mouth daily. 06/30/13   Thurnell Lose, MD  metoprolol (LOPRESSOR) 50 MG tablet Take 50 mg by mouth every evening. 06/21/13  Historical Provider, MD  mycophenolate (CELLCEPT) 250 MG capsule Take 1,000 mg by mouth 2 (two) times daily.    Historical Provider, MD  pantoprazole (PROTONIX) 40 MG tablet Take 40 mg by mouth 2 (two) times daily.     Historical Provider, MD  predniSONE (DELTASONE) 5 MG tablet Take 5 mg by mouth daily.    Historical Provider, MD  PROGRAF 1 MG capsule Take 5 mg by mouth 2 (two) times daily. 06/21/13   Historical Provider, MD    I have reviewed the patient's current medications.  Labs:  Results for orders placed during the hospital encounter of 08/29/13 (from the past 48 hour(s))  COMPREHENSIVE METABOLIC PANEL     Status: Abnormal   Collection Time    08/30/13  1:41 AM      Result Value Range   Sodium 137  135 - 145 mEq/L   Potassium 3.3 (*) 3.5 - 5.1 mEq/L   Chloride 103  96 - 112 mEq/L   CO2 20  19 - 32 mEq/L   Glucose, Bld 126 (*) 70 - 99 mg/dL   BUN 12  6 - 23 mg/dL   Creatinine, Ser 1.94 (*) 0.50 - 1.10 mg/dL   Calcium 7.4 (*) 8.4 - 10.5 mg/dL   Total Protein 6.4  6.0 - 8.3 g/dL   Albumin 3.0 (*) 3.5 - 5.2 g/dL   AST 13  0 - 37 U/L   ALT 5  0 - 35 U/L   Alkaline Phosphatase 40  39 - 117 U/L   Total Bilirubin 0.3  0.3 - 1.2 mg/dL   GFR calc non Af Amer 33 (*) >90 mL/min   GFR calc Af Amer 38 (*) >90 mL/min   Comment: (NOTE)     The eGFR has been calculated using the CKD EPI equation.     This calculation has not been validated in all clinical situations.     eGFR's persistently <90 mL/min signify possible  Chronic Kidney     Disease.  CBC WITH DIFFERENTIAL     Status: Abnormal   Collection Time    08/30/13  1:41 AM      Result Value Range   WBC 11.3 (*) 4.0 - 10.5 K/uL   RBC 3.45 (*) 3.87 - 5.11 MIL/uL   Hemoglobin 8.5 (*) 12.0 - 15.0 g/dL   HCT 26.4 (*) 36.0 - 46.0 %   MCV 76.5 (*) 78.0 - 100.0 fL   MCH 24.6 (*) 26.0 - 34.0 pg   MCHC 32.2  30.0 - 36.0 g/dL   RDW 16.0 (*) 11.5 - 15.5 %   Platelets 157  150 - 400 K/uL   Comment: REPEATED TO VERIFY     PLATELET COUNT CONFIRMED BY SMEAR   Neutrophils Relative % 81 (*) 43 - 77 %   Lymphocytes Relative 9 (*) 12 - 46 %   Monocytes Relative 10  3 - 12 %   Eosinophils Relative 0  0 - 5 %   Basophils Relative 0  0 - 1 %   Neutro Abs 9.2 (*) 1.7 - 7.7 K/uL   Lymphs Abs 1.0  0.7 - 4.0 K/uL   Monocytes Absolute 1.1 (*) 0.1 - 1.0 K/uL   Eosinophils Absolute 0.0  0.0 - 0.7 K/uL   Basophils Absolute 0.0  0.0 - 0.1 K/uL   RBC Morphology POLYCHROMASIA PRESENT     Comment: TEARDROP CELLS     ELLIPTOCYTES   WBC Morphology TOXIC GRANULATION    LACTIC ACID, PLASMA  Status: None   Collection Time    08/30/13  1:41 AM      Result Value Range   Lactic Acid, Venous 1.5  0.5 - 2.2 mmol/L  URINALYSIS, ROUTINE W REFLEX MICROSCOPIC     Status: Abnormal   Collection Time    08/30/13  2:47 AM      Result Value Range   Color, Urine YELLOW  YELLOW   APPearance CLOUDY (*) CLEAR   Specific Gravity, Urine 1.026  1.005 - 1.030   pH 5.0  5.0 - 8.0   Glucose, UA NEGATIVE  NEGATIVE mg/dL   Hgb urine dipstick NEGATIVE  NEGATIVE   Bilirubin Urine NEGATIVE  NEGATIVE   Ketones, ur NEGATIVE  NEGATIVE mg/dL   Protein, ur 100 (*) NEGATIVE mg/dL   Urobilinogen, UA 0.2  0.0 - 1.0 mg/dL   Nitrite NEGATIVE  NEGATIVE   Leukocytes, UA NEGATIVE  NEGATIVE  PREGNANCY, URINE     Status: None   Collection Time    08/30/13  2:47 AM      Result Value Range   Preg Test, Ur NEGATIVE  NEGATIVE   Comment:            THE SENSITIVITY OF THIS     METHODOLOGY IS >20 mIU/mL.   URINE MICROSCOPIC-ADD ON     Status: Abnormal   Collection Time    08/30/13  2:47 AM      Result Value Range   Squamous Epithelial / LPF MANY (*) RARE   WBC, UA 3-6  <3 WBC/hpf   RBC / HPF 0-2  <3 RBC/hpf   Bacteria, UA FEW (*) RARE     ROS:  Constitutional: positive for chills, fatigue, sweats and body aches Respiratory: negative Cardiovascular: negative Gastrointestinal: negative Genitourinary:negative Musculoskeletal:negative, body aches and dizziness  Physical Exam: Filed Vitals:   08/30/13 0402  BP:   Pulse:   Temp: 100.5 F (38.1 C)  Resp:      General: A well appearing black female. She is alert and very knowledgeable about her medical situation. HEENT: Pupils are equal round reactive to light, extraocular motions are intact, mucous membranes are slightly dry Neck: There is no jugular venous distention, no lymphadenopathy Heart: Tachycardic without murmur Lungs: Clear to auscultation bilaterally without wheezes or cough Abdomen:  Positive bowel sounds, soft nontender, nondistended  Extremities: No edema  Skin: Right now clammy  Neuro: Alert, the remainder of the neurologic exam is nonfocal  Assessment/Plan: 32 year old black female status post cadaveric kidney transplant. She now presents with a febrile illness.  1.Fever- severe fever with no localizing symptoms. Cultures have been obtained and she's being covered with Zosyn and vancomycin. She's had a sick contact with her son who likely has a virus. This could be consistent with a viral syndrome. However, given her immune compromised state I agree with antibiotics and supportive care for now.  2.  Renal  - patient has some renal insufficiency at baseline and not completely normal allograft biopsy. I suspect her creatinine is close to her baseline. She is making urine. Will continue to follow. She will be continued on her CellCept, Prograf  and her prednisone. Given her slight hypotension going to increase her dose  of prednisone slightly but still keep her on by mouth.  3.  Hypertension/volume  - patient seems dry at this time. I agree with holding amlodipine and Lasix and hydrating. 4. Anemia- she appears to have this as an outpatient as she is on ESA therapy. This will be  continued. 5. Bones- patient also appears to have this at baseline. She will be continued on her doses of Rocaltrol, TUMS as well as cholecalciferol. Her level is still appropriate for repletion at this time.  Thank you for this consultation. We will continue to follow with you  Kameisha Malicki AAnd  08/30/2013, 5:03 AM

## 2013-08-30 NOTE — Progress Notes (Signed)
   Patient seen and examined earlier today by my colleague Dr. Hal Hope.  Patient seen and examined, database reviewed.  Presented with fever of 104, likely early sepsis.  History of renal transplant, patient taking immunomodulators including prednisone, CellCept and Prograf.  Clear chest x-ray, clear UA waiting on blood cultures.  Patient is  on empiric treatment with Zosyn and vancomycin.   Birdie Hopes Pager: [phone] 08/30/2013, 12:21 PM

## 2013-08-30 NOTE — Progress Notes (Signed)
NURSING PROGRESS NOTE  Raven Walker [id number] Admission Data: 08/30/2013 4:33 AM Attending Provider: Rise Patience, MD TRV:[id number], MC10, MD Code Status: full code   Raven Walker is a 32 y.o. female patient admitted from ED:  -No acute distress noted.  -No complaints of shortness of breath.  -No complaints of chest pain.   Cardiac Monitoring: Box # TX 08 in place. Cardiac monitor yields:sinus tachycardia.  Blood pressure 119/69, pulse 100, temperature 100.5 F (38.1 C), temperature source Oral, resp. rate 18, height $RemoveBe'5\' 9"'tlopkIEUh$  (1.753 m), weight 93.1 kg (205 lb 4 oz), last menstrual period 08/14/2013, SpO2 100.00%.   IV Fluids:  IV in place, occlusive dsg intact without redness, IV cath hand right, condition patent and no redness normal saline.   Allergies:  Nickel  Past Medical History:   has a past medical history of Hypertension; Poor appetite; ESRD on peritoneal dialysis; GERD (gastroesophageal reflux disease) (Dec. 2010); Secondary hyperparathyroidism; and Streptococcal peritonitis ( Sept. 2012).  Past Surgical History:   has past surgical history that includes arm graphs and Parathyroidectomy (08/05/11).  Social History:   reports that she has quit smoking. She has never used smokeless tobacco. She reports that she drinks alcohol. She reports that she does not use illicit drugs.  Skin: Skin is intact  Patient/Family orientated to room. Information packet given to patient/family. Admission inpatient armband information verified with patient/family to include name and date of birth and placed on patient arm. Side rails up x 2, fall assessment and education completed with patient/family. Patient/family able to verbalize understanding of risk associated with falls and verbalized understanding to call for assistance before getting out of bed. Call light within reach. Patient/family able to voice and demonstrate understanding of unit orientation instructions.     Will continue to evaluate and treat per MD orders.

## 2013-08-30 NOTE — Progress Notes (Signed)
Utilization review completed. Allessandra Bernardi, RN, BSN. 

## 2013-08-30 NOTE — Care Management Note (Unsigned)
    Page 1 of 1   08/30/2013     2:46:06 PM   CARE MANAGEMENT NOTE 08/30/2013  Patient:  Raven Walker, Raven Walker   Account Number:  [id number]  Date Initiated:  08/30/2013  Documentation initiated by:  Tomi Bamberger  Subjective/Objective Assessment:   dx fever of unknown origin, ? early sepsis  admit- lives with fiance.     Action/Plan:   Anticipated DC Date:  09/02/2013   Anticipated DC Plan:  [place]  CM consult      Choice offered to / List presented to:             Status of service:  In process, will continue to follow Medicare Important Message given?   (If response is "NO", the following Medicare IM given date fields will be blank) Date Medicare IM given:   Date Additional Medicare IM given:    Discharge Disposition:    Per UR Regulation:  Reviewed for med. necessity/level of care/duration of stay  If discussed at [place] of Stay Meetings, dates discussed:    Comments:  08/30/13 14:45 Deboah Lovena Le RN, BSN [phone] patient lives with fiance, patient with fuo, NCM will continue to follow for dc needs.

## 2013-08-31 DIAGNOSIS — N189 Chronic kidney disease, unspecified: Secondary | ICD-10-CM

## 2013-08-31 DIAGNOSIS — N179 Acute kidney failure, unspecified: Secondary | ICD-10-CM

## 2013-08-31 DIAGNOSIS — I10 Essential (primary) hypertension: Secondary | ICD-10-CM

## 2013-08-31 LAB — CBC
Hemoglobin: 7.5 g/dL — ABNORMAL LOW (ref 12.0–15.0)
MCH: 24.3 pg — ABNORMAL LOW (ref 26.0–34.0)
MCHC: 32.2 g/dL (ref 30.0–36.0)
RDW: 16.1 % — ABNORMAL HIGH (ref 11.5–15.5)

## 2013-08-31 LAB — URINE CULTURE
Colony Count: NO GROWTH
Culture: NO GROWTH

## 2013-08-31 LAB — BASIC METABOLIC PANEL
BUN: 18 mg/dL (ref 6–23)
Calcium: 7.3 mg/dL — ABNORMAL LOW (ref 8.4–10.5)
Creatinine, Ser: 1.76 mg/dL — ABNORMAL HIGH (ref 0.50–1.10)
GFR calc Af Amer: 43 mL/min — ABNORMAL LOW (ref >90)
GFR calc non Af Amer: 37 mL/min — ABNORMAL LOW (ref >90)
Glucose, Bld: 115 mg/dL — ABNORMAL HIGH (ref 70–99)
Potassium: 3.7 mEq/L (ref 3.5–5.1)

## 2013-08-31 MED ORDER — PREDNISONE 10 MG PO TABS
10.0000 mg | ORAL_TABLET | Freq: Every day | ORAL | Status: DC
  Administered 2013-09-01: 10 mg via ORAL
  Filled 2013-08-31 (×2): qty 1

## 2013-08-31 NOTE — Progress Notes (Signed)
Patient ID: [name], female   DOB: 01/04/1981, 32 y.o.   MRN: [id number]   [facility] Progress Note    Assessment/ Plan:   1.Fever-Afebrile overnight with broad spectrum antibiotics and negative cultures so far--likely suggestive of a viral infection, she is non-toxic and possibly in a position to DC in the next 24hrs if remains afebrile (off ABX).  2. Renal - creatinine better and now back to baseline on IVFs and with improving appetite. Continued on her usual immunosuppression.   3. Hypertension/volume - s/p IVFs and lasix held- will restart amlodipine tomorrow based on BPs 4. Anemia- on ESA.  5. Metabolic bone disease- patient also appears to have this at baseline. She will be continued on her doses of Rocaltrol, TUMS as well as cholecalciferol. Her level is still appropriate for repletion at this time.   Subjective:   Reports to be feeling fair and without focal symptoms   Objective:   BP 116/70  Pulse 76  Temp(Src) 97.8 F (36.6 C) (Oral)  Resp 18  Ht $R'5\' 9"'IG$  (1.753 m)  Wt 93.1 kg (205 lb 4 oz)  BMI 30.30 kg/m2  SpO2 100%  LMP 08/14/2013 No intake or output data in the 24 hours ending 08/31/13 1026 Weight change:   Physical Exam: TWK:[id number] resting in bed. Meal tray empty. HAF:BXUXY RRR, Normal S1 and S2 Resp:CTA bilaterally, no rales/rhonchi BFX:OVAN, flat, non-tender, BS normal Ext:No LE edema.  Imaging: No results found.  Labs: BMET  Recent Labs Lab 08/30/13 0141 08/31/13 0338  NA 137 140  K 3.3* 3.7  CL 103 108  CO2 20 20  GLUCOSE 126* 115*  BUN 12 18  CREATININE 1.94* 1.76*  CALCIUM 7.4* 7.3*   CBC  Recent Labs Lab 08/30/13 0141 08/31/13 0338  WBC 11.3* 10.9*  NEUTROABS 9.2*  --   HGB 8.5* 7.5*  HCT 26.4* 23.3*  MCV 76.5* 75.4*  PLT 157 167   Medications:    . calcitRIOL  0.5 mcg Oral BID  . calcium carbonate  3 tablet Oral TID  . cholecalciferol  1,000 Units Oral Daily  . diltiazem  30 mg Oral BID   . [START ON 09/04/2013] epoetin alfa  30,000 Units Subcutaneous Q21 days  . heparin  5,000 Units Subcutaneous Q8H  . mycophenolate  1,000 mg Oral Q12H  . pantoprazole  40 mg Oral BID  . piperacillin-tazobactam (ZOSYN)  IV  3.375 g Intravenous Q8H  . sodium chloride  3 mL Intravenous Q12H  . tacrolimus  5 mg Oral Q12H  . vancomycin  1,250 mg Intravenous Q24H   Elmarie Shiley, MD 08/31/2013, 10:26 AM

## 2013-08-31 NOTE — Progress Notes (Signed)
[facility] PROGRESS NOTE  Raven Walker JWJ:[id number] DOB: 03-18-1981 DOA: 08/29/2013 PCP: Ala Bent, MD  HPI/Subjective: Feels much better, afebrile overnight.  Assessment/Plan: Principal Problem:   Fever Active Problems:   History of renal transplant   Fever of unknown origin -Source is unclear, urinalysis and chest x-ray clear. Blood culture obtained NGTD. -Patient is on vancomycin and [facility], no blood cultures, both A/B influenza antigens are negative. -Could be secondary to viral illness, max temp was 104F.  History of renal transplant -On immunomodulators including Prograf, CellCept and prednisone. -Seen by nephrology recommended to continue immunomodulators.  Chronic anemia -Secondary to chronic kidney disease, stable no acute changes.  Hypertension -Continue metoprolol and Cardizem, she is off of amlodipine.  Code Status: Full code Family Communication: Plan discussed with the patient. Disposition Plan: Remains inpatient   Consultants:  Dr. Posey Pronto of [facility]  Procedures:  None  Antibiotics:  Vancomycin and Zosyn.   Objective: Filed Vitals:   08/31/13 0930  BP: 116/70  Pulse:   Temp:   Resp:    No intake or output data in the 24 hours ending 08/31/13 1314 Filed Weights   08/29/13 2356  Weight: 93.1 kg (205 lb 4 oz)    Exam: General: Alert and awake, oriented x3, not in any acute distress. HEENT: anicteric sclera, pupils reactive to light and accommodation, EOMI CVS: S1-S2 clear, no murmur rubs or gallops Chest: clear to auscultation bilaterally, no wheezing, rales or rhonchi Abdomen: soft nontender, nondistended, normal bowel sounds, no organomegaly Extremities: no cyanosis, clubbing or edema noted bilaterally Neuro: Cranial nerves II-XII intact, no focal neurological deficits  Data Reviewed: Basic Metabolic Panel:  Recent Labs Lab 08/30/13 0141 08/31/13 0338   NA 137 140  K 3.3* 3.7  CL 103 108  CO2 20 20  GLUCOSE 126* 115*  BUN 12 18  CREATININE 1.94* 1.76*  CALCIUM 7.4* 7.3*   Liver Function Tests:  Recent Labs Lab 08/30/13 0141  AST 13  ALT 5  ALKPHOS 40  BILITOT 0.3  PROT 6.4  ALBUMIN 3.0*   No results found for this basename: LIPASE, AMYLASE,  in the last 168 hours No results found for this basename: AMMONIA,  in the last 168 hours CBC:  Recent Labs Lab 08/30/13 0141 08/31/13 0338  WBC 11.3* 10.9*  NEUTROABS 9.2*  --   HGB 8.5* 7.5*  HCT 26.4* 23.3*  MCV 76.5* 75.4*  PLT 157 167   Cardiac Enzymes: No results found for this basename: CKTOTAL, CKMB, CKMBINDEX, TROPONINI,  in the last 168 hours BNP (last 3 results) No results found for this basename: PROBNP,  in the last 8760 hours CBG: No results found for this basename: GLUCAP,  in the last 168 hours  Micro Recent Results (from the past 240 hour(s))  CULTURE, BLOOD (ROUTINE X 2)     Status: None   Collection Time    08/30/13  1:41 AM      Result Value Range Status   Specimen Description BLOOD RIGHT ANTECUBITAL   Final   Special Requests BOTTLES DRAWN AEROBIC ONLY 10CC   Final   Culture  Setup Time     Final   Value: 08/30/2013 08:59     Performed at [facility]   Culture     Final   Value:        BLOOD CULTURE RECEIVED NO GROWTH TO DATE CULTURE WILL BE HELD FOR 5 DAYS BEFORE ISSUING A FINAL NEGATIVE REPORT  Performed at [facility]   Report Status PENDING   Incomplete  CULTURE, BLOOD (ROUTINE X 2)     Status: None   Collection Time    08/30/13  1:54 AM      Result Value Range Status   Specimen Description BLOOD RIGHT HAND   Final   Special Requests BOTTLES DRAWN AEROBIC AND ANAEROBIC 10CC   Final   Culture  Setup Time     Final   Value: 08/30/2013 08:59     Performed at [facility]   Culture     Final   Value:        BLOOD CULTURE RECEIVED NO GROWTH TO DATE CULTURE WILL BE HELD FOR 5 DAYS BEFORE ISSUING A FINAL NEGATIVE  REPORT     Performed at [facility]   Report Status PENDING   Incomplete  URINE CULTURE     Status: None   Collection Time    08/30/13  2:47 AM      Result Value Range Status   Specimen Description URINE, CLEAN CATCH   Final   Special Requests NONE ADDED AT 0425   Final   Culture  Setup Time     Final   Value: 08/30/2013 05:11     Performed at [place]     Final   Value: NO GROWTH     Performed at [facility]   Culture     Final   Value: NO GROWTH     Performed at [facility]   Report Status 08/31/2013 FINAL   Final     Studies: No results found.  Scheduled Meds: . calcitRIOL  0.5 mcg Oral BID  . calcium carbonate  3 tablet Oral TID  . cholecalciferol  1,000 Units Oral Daily  . diltiazem  30 mg Oral BID  . [START ON 09/04/2013] epoetin alfa  30,000 Units Subcutaneous Q21 days  . heparin  5,000 Units Subcutaneous Q8H  . mycophenolate  1,000 mg Oral Q12H  . pantoprazole  40 mg Oral BID  . piperacillin-tazobactam (ZOSYN)  IV  3.375 g Intravenous Q8H  . [START ON 09/01/2013] predniSONE  10 mg Oral Q breakfast  . sodium chloride  3 mL Intravenous Q12H  . tacrolimus  5 mg Oral Q12H  . vancomycin  1,250 mg Intravenous Q24H   Continuous Infusions:      Time spent: 35 minutes    [facility] A  [facility] Pager [phone] If 7PM-7AM, please contact night-coverage at [email or web address], password [email or web address] 08/31/2013, 1:14 PM  LOS: 2 days

## 2013-08-31 NOTE — Progress Notes (Signed)
Pt stated that her right hand was severely swollen where the IV had been placed at [facility] said that since they put it in, it felt really tight to her but she did not complain. RN assessed tape around IV and found it to be really tight on pt's hand. The entire hand (front and back) was swollen. Pt stated that it had been really tight for the past 2 days but tonight it started to swell. RN took IV out and will place new one on opposite hand. Heated pad has been applied and RN will continue to monitor hand.

## 2013-08-31 NOTE — Progress Notes (Signed)
Pt feels she is having a fever. RN came and checked temp 97.9. Lower room temp. Will continue to monitor.

## 2013-09-01 LAB — CBC
MCH: 24.8 pg — ABNORMAL LOW (ref 26.0–34.0)
MCHC: 32.5 g/dL (ref 30.0–36.0)
Platelets: 195 10*3/uL (ref 150–400)
RBC: 3.15 MIL/uL — ABNORMAL LOW (ref 3.87–5.11)

## 2013-09-01 LAB — BASIC METABOLIC PANEL
Calcium: 8 mg/dL — ABNORMAL LOW (ref 8.4–10.5)
Creatinine, Ser: 1.68 mg/dL — ABNORMAL HIGH (ref 0.50–1.10)
GFR calc Af Amer: 45 mL/min — ABNORMAL LOW (ref >90)
GFR calc non Af Amer: 39 mL/min — ABNORMAL LOW (ref >90)
Glucose, Bld: 116 mg/dL — ABNORMAL HIGH (ref 70–99)
Potassium: 3.6 mEq/L (ref 3.5–5.1)
Sodium: 140 mEq/L (ref 135–145)

## 2013-09-01 NOTE — Progress Notes (Signed)
Patient discharge teaching given, including activity, diet, follow-up appoints, and medications. Patient verbalized understanding of all discharge instructions. IV access was d/c'd. Vitals are stable. Skin is intact except as charted in most recent assessments. Pt to be escorted out by RN, to be driven home by family.

## 2013-09-01 NOTE — Discharge Summary (Signed)
Physician Discharge Summary  [name] ERX:[id number] DOB: [birth year] DOA: 08/29/2013  PCP: Delphina Cahill, MD  Admit date: 08/29/2013 Discharge date: 09/01/2013  Time spent: 40 minutes  Recommendations for Outpatient Follow-up:  1. Followup with primary care physician within one week.  Discharge Diagnoses:  Principal Problem:   Fever Active Problems:   HTN (hypertension)   History of renal transplant   Discharge Condition: Stable  Diet recommendation: Heart healthy diet  Filed Weights   08/29/13 2356  Weight: 93.1 kg (205 lb 4 oz)    History of present illness:  Raven Walker is a 32 y.o. female with history of renal transplant, chronic anemia and hypertension was experiencing fever and chills over the last 2 days. Patient did not have any chest pain or shortness of breath productive cough abdominal pain diarrhea dysuria or any discharges. Patient did not have any visual symptoms or headache. In the ER patient was found to be febrile with temperatures around 104F. Blood cultures were sent. And on-call nephrologist was consulted and was revised patient be transferred to [facility] for further management as patient was well known to the nephrology team. Influenza panel and strep throat were negative. Chest x-ray did not show anything acute Patient's UA over there showed negative for leukocyte esterase and nitrites but showed moderate bacteria with loss of epithelial cells. Further labs were not obtained because patient was a difficult stick. Presently patient is alert awake oriented and not in distress.  Hospital Course:   1. Fever: Likely secondary to viral febrile fever, patient came in to the hospital with high-grade fever of 104, presented initially to [facility] and then she was transferred to [facility] for further evaluation. Patient is on prednisone, CellCept and Prograf for previous renal transplant. I have contacted [facility], she had  both A/P influenza antigens are negative, urine cultures negative from over there and no blood culture was done. Patient started empirically on Zosyn and vancomycin. She defervesces within 24 hours, her urine, chest x-ray and blood culture were negative. Patient mentioned that her son had flulike symptoms. Her fever is likely secondary to transient viral syndrome. On discharge no antibiotics prescribed.  2. History of renal transplant: Nephrology was consulted, recommended to continue her immunomodulators including Prograf, CellCept and prednisone.  3. Chronic anemia: Secondary to chronic kidney disease, stable with no acute changes.  4. Hypertension: Her home medication continued throughout the hospital stay.  Procedures:  None  Consultations:  Dr. Posey Pronto of [facility]  Discharge Exam: Filed Vitals:   09/01/13 0900  BP: 124/80  Pulse: 87  Temp: 98.3 F (36.8 C)  Resp: 18   General: Alert and awake, oriented x3, not in any acute distress. HEENT: anicteric sclera, pupils reactive to light and accommodation, EOMI CVS: S1-S2 clear, no murmur rubs or gallops Chest: clear to auscultation bilaterally, no wheezing, rales or rhonchi Abdomen: soft nontender, nondistended, normal bowel sounds, no organomegaly Extremities: no cyanosis, clubbing or edema noted bilaterally Neuro: Cranial nerves II-XII intact, no focal neurological deficits  Discharge Instructions     Medication List         acetaminophen 325 MG tablet  Commonly known as:  TYLENOL  Take 650 mg by mouth every 6 (six) hours as needed. For pain     amLODipine 5 MG tablet  Commonly known as:  NORVASC  Take 5 mg by mouth every morning.     calcitRIOL 0.5 MCG capsule  Commonly known as:  ROCALTROL  Take 0.5 mcg by mouth 2 (two) times daily.     calcium carbonate 500 MG chewable tablet  Commonly known as:  TUMS - dosed in mg elemental calcium  Chew 3 tablets by mouth 3 (three) times daily.      cholecalciferol 400 UNITS Tabs tablet  Commonly known as:  VITAMIN D  Take 1,000 Units by mouth daily.     diltiazem 30 MG tablet  Commonly known as:  CARDIZEM  Take 30 mg by mouth 2 (two) times daily.     epoetin alfa 10000 UNIT/ML injection  Commonly known as:  EPOGEN,PROCRIT  Inject 30,000 Units into the skin every 21 ( twenty-one) days.     furosemide 40 MG tablet  Commonly known as:  LASIX  Take 40 mg by mouth daily.     metoprolol 50 MG tablet  Commonly known as:  LOPRESSOR  Take 50 mg by mouth every evening.     mycophenolate 250 MG capsule  Commonly known as:  CELLCEPT  Take 1,000 mg by mouth 2 (two) times daily.     pantoprazole 40 MG tablet  Commonly known as:  PROTONIX  Take 40 mg by mouth 2 (two) times daily.     predniSONE 5 MG tablet  Commonly known as:  DELTASONE  Take 5 mg by mouth daily.     PROGRAF 1 MG capsule  Generic drug:  tacrolimus  Take 5 mg by mouth 2 (two) times daily.       Allergies  Allergen Reactions  . Nickel Itching and Rash    Only where touched      The results of significant diagnostics from this hospitalization (including imaging, microbiology, ancillary and laboratory) are listed below for reference.    Significant Diagnostic Studies: No results found.  Microbiology: Recent Results (from the past 240 hour(s))  CULTURE, BLOOD (ROUTINE X 2)     Status: None   Collection Time    08/30/13  1:41 AM      Result Value Range Status   Specimen Description BLOOD RIGHT ANTECUBITAL   Final   Special Requests BOTTLES DRAWN AEROBIC ONLY 10CC   Final   Culture  Setup Time     Final   Value: 08/30/2013 08:59     Performed at [facility]   Culture     Final   Value:        BLOOD CULTURE RECEIVED NO GROWTH TO DATE CULTURE WILL BE HELD FOR 5 DAYS BEFORE ISSUING A FINAL NEGATIVE REPORT     Performed at [facility]   Report Status PENDING   Incomplete  CULTURE, BLOOD (ROUTINE X 2)     Status: None   Collection Time     08/30/13  1:54 AM      Result Value Range Status   Specimen Description BLOOD RIGHT HAND   Final   Special Requests BOTTLES DRAWN AEROBIC AND ANAEROBIC 10CC   Final   Culture  Setup Time     Final   Value: 08/30/2013 08:59     Performed at [facility]   Culture     Final   Value:        BLOOD CULTURE RECEIVED NO GROWTH TO DATE CULTURE WILL BE HELD FOR 5 DAYS BEFORE ISSUING A FINAL NEGATIVE REPORT     Performed at [facility]   Report Status PENDING   Incomplete  URINE CULTURE     Status: None   Collection Time  08/30/13  2:47 AM      Result Value Range Status   Specimen Description URINE, CLEAN CATCH   Final   Special Requests NONE ADDED AT 0425   Final   Culture  Setup Time     Final   Value: 08/30/2013 05:11     Performed at [place]     Final   Value: NO GROWTH     Performed at [facility]   Culture     Final   Value: NO GROWTH     Performed at [facility]   Report Status 08/31/2013 FINAL   Final     Labs: Basic Metabolic Panel:  Recent Labs Lab 08/30/13 0141 08/31/13 0338 09/01/13 0358  NA 137 140 140  K 3.3* 3.7 3.6  CL 103 108 110  CO2 $Re'20 20 21  'yrw$ GLUCOSE 126* 115* 116*  BUN $Re'12 18 19  'CCa$ CREATININE 1.94* 1.76* 1.68*  CALCIUM 7.4* 7.3* 8.0*   Liver Function Tests:  Recent Labs Lab 08/30/13 0141  AST 13  ALT 5  ALKPHOS 40  BILITOT 0.3  PROT 6.4  ALBUMIN 3.0*   No results found for this basename: LIPASE, AMYLASE,  in the last 168 hours No results found for this basename: AMMONIA,  in the last 168 hours CBC:  Recent Labs Lab 08/30/13 0141 08/31/13 0338 09/01/13 0358  WBC 11.3* 10.9* 9.2  NEUTROABS 9.2*  --   --   HGB 8.5* 7.5* 7.8*  HCT 26.4* 23.3* 24.0*  MCV 76.5* 75.4* 76.2*  PLT 157 167 195   Cardiac Enzymes: No results found for this basename: CKTOTAL, CKMB, CKMBINDEX, TROPONINI,  in the last 168 hours BNP: BNP (last 3 results) No results found for this basename: PROBNP,   in the last 8760 hours CBG: No results found for this basename: GLUCAP,  in the last 168 hours     Signed:  Manal Kreutzer A  [facility] 09/01/2013, 11:21 AM

## 2013-09-05 LAB — CULTURE, BLOOD (ROUTINE X 2): Culture: NO GROWTH

## 2013-10-09 DIAGNOSIS — Z940 Kidney transplant status: Secondary | ICD-10-CM | POA: Diagnosis not present

## 2013-10-09 DIAGNOSIS — D649 Anemia, unspecified: Secondary | ICD-10-CM | POA: Diagnosis not present

## 2013-10-09 DIAGNOSIS — N2581 Secondary hyperparathyroidism of renal origin: Secondary | ICD-10-CM | POA: Diagnosis not present

## 2013-10-09 DIAGNOSIS — N189 Chronic kidney disease, unspecified: Secondary | ICD-10-CM | POA: Diagnosis not present

## 2013-10-09 DIAGNOSIS — E785 Hyperlipidemia, unspecified: Secondary | ICD-10-CM | POA: Diagnosis not present

## 2013-10-09 DIAGNOSIS — I129 Hypertensive chronic kidney disease with stage 1 through stage 4 chronic kidney disease, or unspecified chronic kidney disease: Secondary | ICD-10-CM | POA: Diagnosis not present

## 2013-10-09 DIAGNOSIS — N183 Chronic kidney disease, stage 3 unspecified: Secondary | ICD-10-CM | POA: Diagnosis not present

## 2013-10-09 DIAGNOSIS — D631 Anemia in chronic kidney disease: Secondary | ICD-10-CM | POA: Diagnosis not present

## 2013-10-09 DIAGNOSIS — N039 Chronic nephritic syndrome with unspecified morphologic changes: Secondary | ICD-10-CM | POA: Diagnosis not present

## 2013-10-18 ENCOUNTER — Other Ambulatory Visit (HOSPITAL_COMMUNITY): Payer: Self-pay | Admitting: *Deleted

## 2013-10-19 ENCOUNTER — Ambulatory Visit (HOSPITAL_COMMUNITY)
Admission: RE | Admit: 2013-10-19 | Discharge: 2013-10-19 | Disposition: A | Discharge status: Home / Self Care | Payer: Medicare Other | Source: Ambulatory Visit | Attending: Nephrology | Admitting: Nephrology

## 2013-10-19 DIAGNOSIS — D631 Anemia in chronic kidney disease: Secondary | ICD-10-CM | POA: Diagnosis not present

## 2013-10-19 DIAGNOSIS — N183 Chronic kidney disease, stage 3 unspecified: Secondary | ICD-10-CM | POA: Diagnosis not present

## 2013-10-19 DIAGNOSIS — Z940 Kidney transplant status: Secondary | ICD-10-CM | POA: Insufficient documentation

## 2013-10-19 DIAGNOSIS — N039 Chronic nephritic syndrome with unspecified morphologic changes: Principal | ICD-10-CM

## 2013-10-19 MED ORDER — SODIUM CHLORIDE 0.9 % IV SOLN
1020.0000 mg | Freq: Once | INTRAVENOUS | Status: AC
  Administered 2013-10-19: 1020 mg via INTRAVENOUS
  Filled 2013-10-19: qty 34

## 2013-10-19 MED ORDER — SODIUM CHLORIDE 0.9 % IV SOLN
INTRAVENOUS | Status: DC
  Administered 2013-10-19: 12:00:00 via INTRAVENOUS

## 2013-10-26 DIAGNOSIS — Z940 Kidney transplant status: Secondary | ICD-10-CM | POA: Diagnosis not present

## 2013-11-06 ENCOUNTER — Encounter: Payer: Self-pay | Admitting: Obstetrics and Gynecology

## 2013-11-15 ENCOUNTER — Ambulatory Visit (INDEPENDENT_AMBULATORY_CARE_PROVIDER_SITE_OTHER): Payer: Medicare Other | Admitting: Obstetrics and Gynecology

## 2013-11-15 ENCOUNTER — Encounter: Payer: Self-pay | Admitting: Obstetrics and Gynecology

## 2013-11-15 ENCOUNTER — Encounter (INDEPENDENT_AMBULATORY_CARE_PROVIDER_SITE_OTHER): Payer: Self-pay

## 2013-11-15 VITALS — BP 120/82 | Ht 69.5 in | Wt 203.0 lb

## 2013-11-15 DIAGNOSIS — Z719 Counseling, unspecified: Secondary | ICD-10-CM

## 2013-11-15 DIAGNOSIS — N924 Excessive bleeding in the premenopausal period: Secondary | ICD-10-CM | POA: Insufficient documentation

## 2013-11-15 DIAGNOSIS — D649 Anemia, unspecified: Secondary | ICD-10-CM | POA: Diagnosis not present

## 2013-11-15 NOTE — Progress Notes (Signed)
This chart was scribed by Jenne Campus, Medical Scribe, for Dr. Mallory Shirk on 11/15/13 at 11:48 AM. This chart was reviewed by Dr. Mallory Shirk and is accurate.    [facility] Visit  Patient name: Raven Walker MRN [id number]  Date of birth: 1981-06-02  CC & HPI:  Raven Walker is a 33 y.o. female presenting today for an endo ablation discussion. Pt became ill during 2005.found to have renal failure. Was on dialysis for 3 years. Did not have menses during that time. Now is having extended menses for 7 to 8 days. 5 out of the days are heavy with clots. She now requires iron supplements. LNMP was 11/01/13. Denies hx fibroids. PAP is due. Has been getting PAPs through PCP. Not currently on birth control.  PCP is Dr. Wende Neighbors. Full time disability ROS:  Extended menses with heavy clot-like bleeding, otherwise negative  Pertinent History Reviewed:  Medical & Surgical Hx:  Reviewed: Significant for kidney transplant Sept. 2013, last admission was Nov 2014, reports to heavy a dose of prograft causes her to become ill Medications: Reviewed & Updated - see associated section Social History: Reviewed -  reports that she has quit smoking. She has never used smokeless tobacco.  Objective Findings:  Vitals: BP 120/82  Ht 5' 9.5" (1.765 m)  Wt 203 lb (92.08 kg)  BMI 29.56 kg/m2  LMP 11/01/2013  Physical Examination: General appearance - alert, well appearing, and in no distress and oriented to person, place, and time Mental status - alert, oriented to person, place, and time, normal mood, behavior, speech, dress, motor activity, and thought processes   Assessment & Plan:  Will get US transvaginal, physical exam followed by endo ablation. Pre and post surgery plans discussion. Pt provided with brochure and is agreeable to plan.

## 2013-11-15 NOTE — Patient Instructions (Signed)
Endometrial Ablation Endometrial ablation removes the lining of the uterus (endometrium). It is usually a same-day, outpatient treatment. Ablation helps avoid major surgery, such as surgery to remove the cervix and uterus (hysterectomy). After endometrial ablation, you will have little or no menstrual bleeding and may not be able to have children. However, if you are premenopausal, you will need to use a reliable method of birth control following the procedure because of the small chance that pregnancy can occur. There are different reasons to have this procedure, which include:  Heavy periods.  Bleeding that is causing anemia.  Irregular bleeding.  Bleeding fibroids on the lining inside the uterus if they are smaller than 3 centimeters. This procedure should not be done if:  You want children in the future.  You have severe cramps with your menstrual period.  You have precancerous or cancerous cells in your uterus.  You were recently pregnant.  You have gone through menopause.  You have had major surgery on the uterus, such as a cesarean delivery. LET [facility] CARE PROVIDER KNOW ABOUT:  Any allergies you have.  All medicines you are taking, including vitamins, herbs, eye drops, creams, and over-the-counter medicines.  Previous problems you or members of your family have had with the use of anesthetics.  Any blood disorders you have.  Previous surgeries you have had.  Medical conditions you have. RISKS AND COMPLICATIONS  Generally, this is a safe procedure. However, as with any procedure, complications can occur. Possible complications include:  Perforation of the uterus.  Bleeding.  Infection of the uterus, bladder, or vagina.  Injury to surrounding organs.  An air bubble to the lung (air embolus).  Pregnancy following the procedure.  Failure of the procedure to help the problem, requiring hysterectomy.  Decreased ability to diagnose cancer in the lining of  the uterus. BEFORE THE PROCEDURE  The lining of the uterus must be tested to make sure there is no pre-cancerous or cancer cells present.  An ultrasound may be performed to look at the size of the uterus and to check for abnormalities.  Medicines may be given to thin the lining of the uterus. PROCEDURE  During the procedure, your health care provider will use a tool called a resectoscope to help see inside your uterus. There are different ways to remove the lining of your uterus.   Radiofrequency  This method uses a radiofrequency-alternating electric current to remove the lining of the uterus.  Cryotherapy This method uses extreme cold to freeze the lining of the uterus.  Heated-Free Liquid  This method uses heated salt (saline) solution to remove the lining of the uterus.  Microwave This method uses high-energy microwaves to heat up the lining of the uterus to remove it.  Thermal balloon  This method involves inserting a catheter with a balloon tip into the uterus. The balloon tip is filled with heated fluid to remove the lining of the uterus. AFTER THE PROCEDURE  After your procedure, do not have sexual intercourse or insert anything into your vagina until permitted by your health care provider. After the procedure, you may experience:  Cramps.  Vaginal discharge.  Frequent urination. Document Released: 07/31/2004 Document Revised: 05/24/2013 Document Reviewed: 02/22/2013 [facility] Patient Information 2014 [place].

## 2013-11-20 ENCOUNTER — Encounter (HOSPITAL_COMMUNITY): Payer: Self-pay | Admitting: Emergency Medicine

## 2013-11-20 ENCOUNTER — Inpatient Hospital Stay (HOSPITAL_COMMUNITY)
Admission: EM | Admit: 2013-11-20 | Discharge: 2013-11-23 | DRG: 872 | Disposition: A | Discharge status: Home / Self Care | Payer: Medicare Other | Attending: Internal Medicine | Admitting: Internal Medicine

## 2013-11-20 DIAGNOSIS — Z79899 Other long term (current) drug therapy: Secondary | ICD-10-CM

## 2013-11-20 DIAGNOSIS — R509 Fever, unspecified: Secondary | ICD-10-CM | POA: Diagnosis not present

## 2013-11-20 DIAGNOSIS — E860 Dehydration: Secondary | ICD-10-CM | POA: Diagnosis present

## 2013-11-20 DIAGNOSIS — A419 Sepsis, unspecified organism: Principal | ICD-10-CM | POA: Diagnosis present

## 2013-11-20 DIAGNOSIS — IMO0002 Reserved for concepts with insufficient information to code with codable children: Secondary | ICD-10-CM

## 2013-11-20 DIAGNOSIS — T8610 Unspecified complication of kidney transplant: Secondary | ICD-10-CM

## 2013-11-20 DIAGNOSIS — K219 Gastro-esophageal reflux disease without esophagitis: Secondary | ICD-10-CM | POA: Diagnosis not present

## 2013-11-20 DIAGNOSIS — R252 Cramp and spasm: Secondary | ICD-10-CM | POA: Diagnosis not present

## 2013-11-20 DIAGNOSIS — I129 Hypertensive chronic kidney disease with stage 1 through stage 4 chronic kidney disease, or unspecified chronic kidney disease: Secondary | ICD-10-CM | POA: Diagnosis present

## 2013-11-20 DIAGNOSIS — N179 Acute kidney failure, unspecified: Secondary | ICD-10-CM | POA: Diagnosis present

## 2013-11-20 DIAGNOSIS — N390 Urinary tract infection, site not specified: Secondary | ICD-10-CM | POA: Diagnosis not present

## 2013-11-20 DIAGNOSIS — Z87891 Personal history of nicotine dependence: Secondary | ICD-10-CM

## 2013-11-20 DIAGNOSIS — Z823 Family history of stroke: Secondary | ICD-10-CM

## 2013-11-20 DIAGNOSIS — E876 Hypokalemia: Secondary | ICD-10-CM | POA: Diagnosis present

## 2013-11-20 DIAGNOSIS — J069 Acute upper respiratory infection, unspecified: Secondary | ICD-10-CM | POA: Diagnosis present

## 2013-11-20 DIAGNOSIS — B9789 Other viral agents as the cause of diseases classified elsewhere: Secondary | ICD-10-CM | POA: Diagnosis present

## 2013-11-20 DIAGNOSIS — Z940 Kidney transplant status: Secondary | ICD-10-CM | POA: Diagnosis not present

## 2013-11-20 DIAGNOSIS — D649 Anemia, unspecified: Secondary | ICD-10-CM | POA: Diagnosis present

## 2013-11-20 DIAGNOSIS — Z833 Family history of diabetes mellitus: Secondary | ICD-10-CM

## 2013-11-20 DIAGNOSIS — Z8249 Family history of ischemic heart disease and other diseases of the circulatory system: Secondary | ICD-10-CM | POA: Diagnosis not present

## 2013-11-20 DIAGNOSIS — I10 Essential (primary) hypertension: Secondary | ICD-10-CM

## 2013-11-20 DIAGNOSIS — M791 Myalgia, unspecified site: Secondary | ICD-10-CM | POA: Diagnosis present

## 2013-11-20 DIAGNOSIS — N281 Cyst of kidney, acquired: Secondary | ICD-10-CM | POA: Diagnosis not present

## 2013-11-20 DIAGNOSIS — N186 End stage renal disease: Secondary | ICD-10-CM | POA: Diagnosis not present

## 2013-11-20 DIAGNOSIS — N183 Chronic kidney disease, stage 3 unspecified: Secondary | ICD-10-CM | POA: Diagnosis present

## 2013-11-20 DIAGNOSIS — N189 Chronic kidney disease, unspecified: Secondary | ICD-10-CM | POA: Diagnosis present

## 2013-11-20 DIAGNOSIS — D72829 Elevated white blood cell count, unspecified: Secondary | ICD-10-CM | POA: Diagnosis present

## 2013-11-20 DIAGNOSIS — N1330 Unspecified hydronephrosis: Secondary | ICD-10-CM | POA: Diagnosis not present

## 2013-11-20 DIAGNOSIS — N12 Tubulo-interstitial nephritis, not specified as acute or chronic: Secondary | ICD-10-CM

## 2013-11-20 LAB — CBC
HCT: 31.9 % — ABNORMAL LOW (ref 36.0–46.0)
HEMATOCRIT: 27.7 % — AB (ref 36.0–46.0)
HEMOGLOBIN: 8.9 g/dL — AB (ref 12.0–15.0)
Hemoglobin: 10.3 g/dL — ABNORMAL LOW (ref 12.0–15.0)
MCH: 24.9 pg — ABNORMAL LOW (ref 26.0–34.0)
MCH: 24.8 pg — AB (ref 26.0–34.0)
MCHC: 32.3 g/dL (ref 30.0–36.0)
MCHC: 32.1 g/dL (ref 30.0–36.0)
MCV: 77.1 fL — ABNORMAL LOW (ref 78.0–100.0)
MCV: 77.2 fL — ABNORMAL LOW (ref 78.0–100.0)
Platelets: 229 10*3/uL (ref 150–400)
Platelets: 192 10*3/uL (ref 150–400)
RBC: 4.14 MIL/uL (ref 3.87–5.11)
RBC: 3.59 MIL/uL — ABNORMAL LOW (ref 3.87–5.11)
RDW: 16.8 % — ABNORMAL HIGH (ref 11.5–15.5)
RDW: 16.9 % — AB (ref 11.5–15.5)
WBC: 16.3 10*3/uL — ABNORMAL HIGH (ref 4.0–10.5)
WBC: 12.5 10*3/uL — ABNORMAL HIGH (ref 4.0–10.5)

## 2013-11-20 LAB — URINE MICROSCOPIC-ADD ON

## 2013-11-20 LAB — CREATININE, SERUM
CREATININE: 2.44 mg/dL — AB (ref 0.50–1.10)
GFR calc Af Amer: 28 mL/min — ABNORMAL LOW (ref >90)
GFR calc non Af Amer: 25 mL/min — ABNORMAL LOW (ref >90)

## 2013-11-20 LAB — COMPREHENSIVE METABOLIC PANEL
ALT: 13 U/L (ref 0–35)
AST: 19 U/L (ref 0–37)
Albumin: 3.7 g/dL (ref 3.5–5.2)
Alkaline Phosphatase: 60 U/L (ref 39–117)
BUN: 16 mg/dL (ref 6–23)
CO2: 22 mEq/L (ref 19–32)
Chloride: 98 mEq/L (ref 96–112)
GFR calc Af Amer: 33 mL/min — ABNORMAL LOW (ref >90)
Potassium: 3.7 mEq/L (ref 3.7–5.3)
Total Bilirubin: 0.4 mg/dL (ref 0.3–1.2)

## 2013-11-20 LAB — COMPREHENSIVE METABOLIC PANEL WITH GFR
Calcium: 8.3 mg/dL — ABNORMAL LOW (ref 8.4–10.5)
Creatinine, Ser: 2.16 mg/dL — ABNORMAL HIGH (ref 0.50–1.10)
GFR calc non Af Amer: 28 mL/min — ABNORMAL LOW (ref >90)
Glucose, Bld: 119 mg/dL — ABNORMAL HIGH (ref 70–99)
Sodium: 137 meq/L (ref 137–147)
Total Protein: 7.9 g/dL (ref 6.0–8.3)

## 2013-11-20 LAB — LACTIC ACID, PLASMA: Lactic Acid, Venous: 0.8 mmol/L (ref 0.5–2.2)

## 2013-11-20 LAB — URINALYSIS, ROUTINE W REFLEX MICROSCOPIC
Glucose, UA: NEGATIVE mg/dL
Ketones, ur: 15 mg/dL — AB
Nitrite: NEGATIVE
Protein, ur: 100 mg/dL — AB
Specific Gravity, Urine: 1.024 (ref 1.005–1.030)
Urobilinogen, UA: 0.2 mg/dL (ref 0.0–1.0)
pH: 5 (ref 5.0–8.0)

## 2013-11-20 LAB — POCT PREGNANCY, URINE: Preg Test, Ur: NEGATIVE

## 2013-11-20 LAB — MAGNESIUM: Magnesium: 1.7 mg/dL (ref 1.5–2.5)

## 2013-11-20 LAB — MRSA PCR SCREENING: MRSA by PCR: NEGATIVE

## 2013-11-20 LAB — PHOSPHORUS: PHOSPHORUS: 3.4 mg/dL (ref 2.3–4.6)

## 2013-11-20 MED ORDER — SODIUM CHLORIDE 0.9 % IV SOLN
Freq: Once | INTRAVENOUS | Status: AC
  Administered 2013-11-20: 15:00:00 via INTRAVENOUS

## 2013-11-20 MED ORDER — TACROLIMUS 1 MG PO CAPS: 4.0000 mg | ORAL_CAPSULE | Freq: Two times a day (BID) | ORAL | Status: DC

## 2013-11-20 MED ORDER — TACROLIMUS 1 MG PO CAPS
4.0000 mg | ORAL_CAPSULE | Freq: Every morning | ORAL | Status: DC
  Administered 2013-11-21 – 2013-11-23 (×3): 4 mg via ORAL
  Filled 2013-11-20 (×3): qty 4

## 2013-11-20 MED ORDER — PIPERACILLIN-TAZOBACTAM IN DEX 2-0.25 GM/50ML IV SOLN
2.2500 g | Freq: Three times a day (TID) | INTRAVENOUS | Status: DC
  Administered 2013-11-21 – 2013-11-22 (×4): 2.25 g via INTRAVENOUS
  Filled 2013-11-20 (×7): qty 50

## 2013-11-20 MED ORDER — VITAMIN D3 25 MCG (1000 UNIT) PO TABS
1000.0000 [IU] | ORAL_TABLET | Freq: Every day | ORAL | Status: DC
  Administered 2013-11-20 – 2013-11-23 (×4): 1000 [IU] via ORAL
  Filled 2013-11-20 (×5): qty 1

## 2013-11-20 MED ORDER — ONDANSETRON HCL 4 MG/2ML IJ SOLN: 4.0000 mg | Freq: Four times a day (QID) | INTRAMUSCULAR | Status: DC | PRN

## 2013-11-20 MED ORDER — PREDNISONE 5 MG PO TABS
5.0000 mg | ORAL_TABLET | Freq: Every day | ORAL | Status: DC
  Administered 2013-11-20 – 2013-11-23 (×4): 5 mg via ORAL
  Filled 2013-11-20 (×4): qty 1

## 2013-11-20 MED ORDER — ACETAMINOPHEN 325 MG PO TABS
650.0000 mg | ORAL_TABLET | Freq: Once | ORAL | Status: AC
  Administered 2013-11-20: 650 mg via ORAL
  Filled 2013-11-20: qty 2

## 2013-11-20 MED ORDER — PIPERACILLIN-TAZOBACTAM 3.375 G IVPB 30 MIN
3.3750 g | Freq: Once | INTRAVENOUS | Status: AC
  Administered 2013-11-20: 3.375 g via INTRAVENOUS
  Filled 2013-11-20: qty 50

## 2013-11-20 MED ORDER — ACETAMINOPHEN 650 MG RE SUPP: 650.0000 mg | Freq: Four times a day (QID) | RECTAL | Status: DC | PRN

## 2013-11-20 MED ORDER — CALCITRIOL 0.5 MCG PO CAPS
0.5000 ug | ORAL_CAPSULE | Freq: Two times a day (BID) | ORAL | Status: DC
  Administered 2013-11-20 – 2013-11-23 (×6): 0.5 ug via ORAL
  Filled 2013-11-20 (×9): qty 1

## 2013-11-20 MED ORDER — ONDANSETRON HCL 4 MG PO TABS: 4.0000 mg | ORAL_TABLET | Freq: Four times a day (QID) | ORAL | Status: DC | PRN

## 2013-11-20 MED ORDER — PANTOPRAZOLE SODIUM 40 MG PO TBEC
40.0000 mg | DELAYED_RELEASE_TABLET | Freq: Two times a day (BID) | ORAL | Status: DC
  Administered 2013-11-20 – 2013-11-23 (×6): 40 mg via ORAL
  Filled 2013-11-20 (×6): qty 1

## 2013-11-20 MED ORDER — MORPHINE SULFATE 4 MG/ML IJ SOLN
4.0000 mg | Freq: Once | INTRAMUSCULAR | Status: AC
  Administered 2013-11-20: 4 mg via INTRAVENOUS
  Filled 2013-11-20: qty 1

## 2013-11-20 MED ORDER — MYCOPHENOLATE MOFETIL 250 MG PO CAPS
1000.0000 mg | ORAL_CAPSULE | Freq: Two times a day (BID) | ORAL | Status: DC
  Administered 2013-11-20 – 2013-11-23 (×6): 1000 mg via ORAL
  Filled 2013-11-20 (×7): qty 4

## 2013-11-20 MED ORDER — ACETAMINOPHEN 325 MG PO TABS
650.0000 mg | ORAL_TABLET | Freq: Four times a day (QID) | ORAL | Status: DC | PRN
  Administered 2013-11-22: 650 mg via ORAL
  Filled 2013-11-20: qty 2

## 2013-11-20 MED ORDER — ONDANSETRON HCL 4 MG/2ML IJ SOLN
4.0000 mg | Freq: Once | INTRAMUSCULAR | Status: AC
  Administered 2013-11-20: 4 mg via INTRAVENOUS
  Filled 2013-11-20: qty 2

## 2013-11-20 MED ORDER — SODIUM CHLORIDE 0.9 % IV SOLN
INTRAVENOUS | Status: AC
  Administered 2013-11-20 – 2013-11-21 (×3): via INTRAVENOUS

## 2013-11-20 MED ORDER — HEPARIN SODIUM (PORCINE) 5000 UNIT/ML IJ SOLN
5000.0000 [IU] | Freq: Three times a day (TID) | INTRAMUSCULAR | Status: DC
  Administered 2013-11-20 – 2013-11-23 (×8): 5000 [IU] via SUBCUTANEOUS
  Filled 2013-11-20 (×11): qty 1

## 2013-11-20 MED ORDER — OSELTAMIVIR PHOSPHATE 75 MG PO CAPS
75.0000 mg | ORAL_CAPSULE | Freq: Every day | ORAL | Status: DC
  Administered 2013-11-20 – 2013-11-21 (×2): 75 mg via ORAL
  Filled 2013-11-20 (×3): qty 1

## 2013-11-20 MED ORDER — CALCIUM CARBONATE ANTACID 500 MG PO CHEW
3.0000 | CHEWABLE_TABLET | Freq: Three times a day (TID) | ORAL | Status: DC
  Administered 2013-11-20 – 2013-11-23 (×9): 600 mg via ORAL
  Filled 2013-11-20 (×11): qty 3

## 2013-11-20 MED ORDER — MORPHINE SULFATE 2 MG/ML IJ SOLN
1.0000 mg | INTRAMUSCULAR | Status: DC | PRN
  Administered 2013-11-20 – 2013-11-22 (×4): 1 mg via INTRAVENOUS
  Filled 2013-11-20 (×4): qty 1

## 2013-11-20 MED ORDER — TACROLIMUS 1 MG PO CAPS
5.0000 mg | ORAL_CAPSULE | Freq: Every evening | ORAL | Status: DC
  Administered 2013-11-20 – 2013-11-22 (×3): 5 mg via ORAL
  Filled 2013-11-20 (×6): qty 5

## 2013-11-20 MED ORDER — METOPROLOL TARTRATE 50 MG PO TABS
50.0000 mg | ORAL_TABLET | Freq: Every evening | ORAL | Status: DC
  Administered 2013-11-21 – 2013-11-22 (×2): 50 mg via ORAL
  Filled 2013-11-20 (×4): qty 1

## 2013-11-20 NOTE — Progress Notes (Addendum)
ANTIBIOTIC CONSULT NOTE - INITIAL  Pharmacy Consult for Zosyn Indication: urosepsis  Allergies  Allergen Reactions  . Nickel Itching and Rash    Only where touched  Patient Measurements: Height: 5\' 9"  (175.3 cm) Weight: 199 lb 11.8 oz (90.6 kg) IBW/kg (Calculated) : 66.2 Vital Signs: Temp: 98.6 F (37 C) (02/16 1735) Temp src: Oral (02/16 1735) BP: 100/54 mmHg (02/16 1829) Pulse Rate: 104 (02/16 1829) Labs:  Recent Labs  11/20/13 1246  WBC 16.3*  HGB 10.3*  PLT 229  CREATININE 2.16*   Estimated Creatinine Clearance: 43.6 ml/min (by C-G formula based on Cr of 2.16).  Microbiology: No results found for this or any previous visit (from the past 720 hour(s)).  Medical History: Past Medical History  Diagnosis Date  . Hypertension   . Poor appetite   . 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 GERD (gastroesophageal reflux disease) Dec. 2010    ulcerative esophagitis per EGD   . Secondary hyperparathyroidism     s/p parathyroidectomy with autotransplantation to left arm per Dr. Baker Pierini on 08/04/11  . Streptococcal peritonitis  Sept. 2012   Assessment: 89 YOF with ESRD on peritoneal dialysis admitted with r/o urosepsis to start IV Zosyn. Patient has fever, leukocytosis, and reports N/V and foul smelling urine. Cultures are pending.   Goal of Therapy:  Clinical resolution of infection  Plan:  1. Zosyn 3.375g IV over 30 min STAT then 2.25g IV q8h.  2. Follow-up renal consult and recommendations for dialysis while inpatient. Adjust dosing regimen as needed.  Sloan Leiter, PharmD, BCPS Clinical Pharmacist [phone]   11/22/13: Lajean Silvius continues at 2.25 grams iv Q 8 hours Pharmacy will sign off. Re - consult if needed.  Thank you Anette Guarneri, PharmD [phone]

## 2013-11-20 NOTE — ED Notes (Signed)
Pt had kidney transplant in 06/2012.  Pt states had fever last pm of 104.  Pt reports she took tylenol for her fever.  Pt reports overall body aches.  Pt reports that her urine is starting to have an odor.

## 2013-11-20 NOTE — Progress Notes (Addendum)
[facility] History and Physical  CAGNEY STEENSON YKZ:[id number] DOB: 1980-12-02 DOA: 11/20/2013  Referring physician: Clayton Bibles PA PCP: Delphina Cahill, MD   Chief Complaint: N/V, fever, chills and myalgia  HPI: Raven Walker is a 33 y.o. female with PMH of HTN, ESRD (s/p transplant; no CKD stage 2-3 with baseline Cr 1.2) and GERD; came to ED complaining of fever, chills, nausea/vomiting, myalgias and intermittent SOB. Patient also reports increase odor in her urine. She denies hematemesis, melena, sick contacts, CP, HA's or any other complaints. In ED patient found with WBC's 16000, worsening renal function, febrile 102.2; tachycardic and with UA suggesting UTI.  Of note patient symptoms started Saturday night.    Review of Systems:  Negative except as mentioned on HPI.  Past Medical History  Diagnosis Date  . Hypertension   . Poor appetite   . 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 GERD (gastroesophageal reflux disease) Dec. 2010    ulcerative esophagitis per EGD   . Secondary hyperparathyroidism     s/p parathyroidectomy with autotransplantation to left arm per Dr. Baker Pierini on 08/04/11  . Streptococcal peritonitis  Sept. 2012   Past Surgical History  Procedure Laterality Date  . Arm graphs      three - done at [facility]  . Parathyroidectomy  08/05/11    with graft of parathyroid into left upper arm.  . Kidney transplant Right 2013   Social History:  reports that she has quit smoking. She has never used smokeless tobacco. She reports that she drinks alcohol. She reports that she does not use illicit drugs.  Allergies  Allergen Reactions  . Nickel Itching and Rash    Only where touched    Family History  Problem Relation Age of Onset  . Hypertension Mother   . Hypertension Father   . Obesity Son   . Cancer Maternal Uncle   . Diabetes Maternal Grandmother   . Stroke Maternal Grandfather      Prior  to Admission medications   Medication Sig Start Date End Date Taking? Authorizing Provider  acetaminophen (TYLENOL) 325 MG tablet Take 650 mg by mouth every 6 (six) hours as needed. For pain    Yes Historical Provider, MD  calcitRIOL (ROCALTROL) 0.5 MCG capsule Take 0.5 mcg by mouth 2 (two) times daily. 04/25/13  Yes Historical Provider, MD  calcium carbonate (TUMS - DOSED IN MG ELEMENTAL CALCIUM) 500 MG chewable tablet Chew 3 tablets by mouth 3 (three) times daily.   Yes Historical Provider, MD  cholecalciferol (VITAMIN D) 400 UNITS TABS tablet Take 1,000 Units by mouth daily.   Yes Historical Provider, MD  diltiazem (CARDIZEM) 30 MG tablet Take 30 mg by mouth 2 (two) times daily. 03/27/13  Yes Historical Provider, MD  epoetin alfa (EPOGEN,PROCRIT) 93903 UNIT/ML injection Inject 30,000 Units into the skin every 21 ( twenty-one) days.   Yes Historical Provider, MD  furosemide (LASIX) 40 MG tablet Take 40 mg by mouth daily. 06/21/13  Yes Historical Provider, MD  metoprolol (LOPRESSOR) 50 MG tablet Take 50 mg by mouth every evening. 06/21/13  Yes Historical Provider, MD  mycophenolate (CELLCEPT) 250 MG capsule Take 1,000 mg by mouth 2 (two) times daily.   Yes Historical Provider, MD  pantoprazole (PROTONIX) 40 MG tablet Take 40 mg by mouth 2 (two) times daily.    Yes Historical Provider, MD  predniSONE (DELTASONE) 5 MG tablet Take 5 mg by mouth  daily.   Yes Historical Provider, MD  PROGRAF 1 MG capsule Take 4-5 mg by mouth 2 (two) times daily. $RemoveBefo'4mg'qdVZxTTpOwU$  in the morning and $RemoveBef'5mg'JRxyaQaGlJ$  in the evening. 06/21/13  Yes Historical Provider, MD   Physical Exam: Filed Vitals:   11/20/13 1735  BP:   Pulse:   Temp: 98.6 F (37 C)  Resp:     BP 111/63  Pulse 112  Temp(Src) 98.6 F (37 C) (Oral)  Resp 18  Ht $R'5\' 9"'HZ$  (1.753 m)  Wt 90.6 kg (199 lb 11.8 oz)  BMI 29.48 kg/m2  SpO2 100%  LMP 11/01/2013  General:  Appears calm, febrile, complaining of nausea Eyes: PERRL, normal lids, irises & conjunctiva ENT: grossly  normal hearing, dry MM, no erythema or exudates inside her mouth Neck: no LAD, masses or thyromegaly, no JVD Cardiovascular: tachycardic, no rubs or gallops Respiratory: CTA bilaterally, no w/r/r. Normal respiratory effort. Abdomen: soft, diffuse tendernes, nd, positive BS Skin: no rash, petechiae or induration seen on exam Musculoskeletal: grossly normal tone BUE/BLE Psychiatric: grossly normal mood and affect, speech fluent and appropriate Neurologic: grossly non-focal.          Labs on Admission:  Basic Metabolic Panel:  Recent Labs Lab 11/20/13 1246  NA 137  K 3.7  CL 98  CO2 22  GLUCOSE 119*  BUN 16  CREATININE 2.16*  CALCIUM 8.3*   Liver Function Tests:  Recent Labs Lab 11/20/13 1246  AST 19  ALT 13  ALKPHOS 60  BILITOT 0.4  PROT 7.9  ALBUMIN 3.7   CBC:  Recent Labs Lab 11/20/13 1246  WBC 16.3*  HGB 10.3*  HCT 31.9*  MCV 77.1*  PLT 229   BNP (last 3 results) No results found for this basename: PROBNP,  in the last 8760 hours  Assessment/Plan 1-Sepsis: patient with temp 102.2; WBC's 16,000; HR 115 and abnormal urine suggesting UTI. -will admit to step down -check urine culture, lactic acid and blood cx's -start zosyn -given some ongoing myalgias, URI and high fever, will check resp viral panel and place patient on droplet precautions. -will provide IVF's -supportive care  2-Renal failure (ARF), acute on chronic: hx of transplant in the past. -baseline Cr 1.2 -start IVF's -follow urine culture -check cellcept level -will continue cellcept, prograft and steroids -renal has been consulted will follow rec's -check renal US  3-GERD (gastroesophageal reflux disease): continue PPI  4-Fever and leukocytosis: due to #1; treatment as mentioned above  5-UTI (urinary tract infection): follow urine cultures; started on zosyn  6-cough, SOB and Myalgia: with high grade fever will be also concern for flu. -will start tamiflu if patient neg on resp  panel will d/c in am  7-HTN: stable. Will continue b-blocker. Given settings of early sepsis  DVT: heparin  Renal service (Dr. Arty Baumgartner)  Code Status: Full Family Communication: husband at bedside Disposition Plan: inpatient, LOS > 2 days; stepdown  Time spent: 2 minutes  [facility] Pager [phone]

## 2013-11-20 NOTE — Progress Notes (Signed)
Raven Walker [id number] Code Status: full   Admission Data: 11/20/2013 4:39 PM Attending Provider:  Dyann Kief XNA:TFTD, [facility], MD Consults/ Treatment Team:    [name] is a 33 y.o. female patient admitted from ED awake, alert - oriented  X 3 - no acute distress noted.  VSS - Blood pressure 111/63, pulse 112, temperature 102.2 F (39 C), temperature source Oral, resp. rate 18, weight 90.776 kg (200 lb 2 oz), last menstrual period 11/01/2013, SpO2 100.00%.  no c/o shortness of breath, no c/o chest pain. Cardiac tele # [id number], in place, cardiac monitor yields:sinus tachycardia. IV Fluids:  IV in place, occlusive dsg intact without redness, IV cath antecubital left, condition patent and no redness normal saline.  Allergies:   Allergies  Allergen Reactions  . Nickel Itching and Rash    Only where touched     Past Medical History  Diagnosis Date  . Hypertension   . Poor appetite   . 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 GERD (gastroesophageal reflux disease) Dec. 2010    ulcerative esophagitis per EGD   . Secondary hyperparathyroidism     s/p parathyroidectomy with autotransplantation to left arm per Dr. Baker Pierini on 08/04/11  . Streptococcal peritonitis  Sept. 2012   Medications Prior to Admission  Medication Sig Dispense Refill  . acetaminophen (TYLENOL) 325 MG tablet Take 650 mg by mouth every 6 (six) hours as needed. For pain       . calcitRIOL (ROCALTROL) 0.5 MCG capsule Take 0.5 mcg by mouth 2 (two) times daily.      . calcium carbonate (TUMS - DOSED IN MG ELEMENTAL CALCIUM) 500 MG chewable tablet Chew 3 tablets by mouth 3 (three) times daily.      . cholecalciferol (VITAMIN D) 400 UNITS TABS tablet Take 1,000 Units by mouth daily.      Marland Kitchen diltiazem (CARDIZEM) 30 MG tablet Take 30 mg by mouth 2 (two) times daily.      Marland Kitchen epoetin alfa (EPOGEN,PROCRIT) 32202 UNIT/ML injection Inject 30,000 Units into the skin every 21 (  twenty-one) days.      . furosemide (LASIX) 40 MG tablet Take 40 mg by mouth daily.      . metoprolol (LOPRESSOR) 50 MG tablet Take 50 mg by mouth every evening.      . mycophenolate (CELLCEPT) 250 MG capsule Take 1,000 mg by mouth 2 (two) times daily.      . pantoprazole (PROTONIX) 40 MG tablet Take 40 mg by mouth 2 (two) times daily.       . predniSONE (DELTASONE) 5 MG tablet Take 5 mg by mouth daily.      Marland Kitchen PROGRAF 1 MG capsule Take 4-5 mg by mouth 2 (two) times daily. $RemoveBefo'4mg'NyrczPveIBh$  in the morning and $RemoveBef'5mg'WlpnrPKZBN$  in the evening.       History:  obtained from the patient. Tobacco/alcohol: denied none  Orientation to room, and floor completed with information packet given to patient/family.  Patient declined safety video at this time.  Admission INP armband ID verified with patient/family, and in place.   SR up x 2, fall assessment complete, with patient and family able to verbalize understanding of risk associated with falls, and verbalized understanding to call nsg before up out of bed.  Call light within reach, patient able to voice, and demonstrate understanding.  Skin, clean-dry- intact without evidence of bruising, or skin tears.   No evidence of skin break  down noted on exam.     Will cont to eval and treat per MD orders.  Gracy Bruins, RN 11/20/2013 4:39 PM

## 2013-11-20 NOTE — H&P (Signed)
[facility]  History and Physical   JAIMIE PIPPINS SNK:[id number] DOB: [birth year] DOA: 11/20/2013   Referring physician: Clayton Bibles PA  PCP: Delphina Cahill, MD   Chief Complaint: N/V, fever, chills and myalgia   HPI: Raven Walker is a 33 y.o. female with PMH of HTN, ESRD (s/p transplant; no CKD stage 2-3 with baseline Cr 1.2) and GERD; came to ED complaining of fever, chills, nausea/vomiting, myalgias and intermittent SOB. Patient also reports increase odor in her urine. She denies hematemesis, melena, sick contacts, CP, HA's or any other complaints. In ED patient found with WBC's 16000, worsening renal function, febrile 102.2; tachycardic and with UA suggesting UTI.  Of note patient symptoms started Saturday night.   Review of Systems:  Negative except as mentioned on HPI.  Past Medical History   Diagnosis  Date   .  Hypertension    .  Poor appetite    .  ESRD on peritoneal dialysis      secondary to HTN and congenital single kidney   .  GERD (gastroesophageal reflux disease)  Dec. 2010     ulcerative esophagitis per EGD   .  Secondary hyperparathyroidism      s/p parathyroidectomy with autotransplantation to left arm per Dr. Baker Pierini on 08/04/11   .  Streptococcal peritonitis  Sept. 2012    Past Surgical History   Procedure  Laterality  Date   .  Arm graphs       three - done at [facility]   .  Parathyroidectomy   08/05/11     with graft of parathyroid into left upper arm.   .  Kidney transplant  Right  2013    Social History: reports that she has quit smoking. She has never used smokeless tobacco. She reports that she drinks alcohol. She reports that she does not use illicit drugs.   Allergies   Allergen  Reactions   .  Nickel  Itching and Rash     Only where touched    Family History   Problem  Relation  Age of Onset   .  Hypertension  Mother    .  Hypertension  Father    .  Obesity  Son    .  Cancer  Maternal Uncle    .  Diabetes  Maternal  Grandmother    .  Stroke  Maternal Grandfather     Prior to Admission medications   Medication  Sig  Start Date  End Date  Taking?  Authorizing Provider   acetaminophen (TYLENOL) 325 MG tablet  Take 650 mg by mouth every 6 (six) hours as needed. For pain    Yes  Historical Provider, MD   calcitRIOL (ROCALTROL) 0.5 MCG capsule  Take 0.5 mcg by mouth 2 (two) times daily.  04/25/13   Yes  Historical Provider, MD   calcium carbonate (TUMS - DOSED IN MG ELEMENTAL CALCIUM) 500 MG chewable tablet  Chew 3 tablets by mouth 3 (three) times daily.    Yes  Historical Provider, MD   cholecalciferol (VITAMIN D) 400 UNITS TABS tablet  Take 1,000 Units by mouth daily.    Yes  Historical Provider, MD   diltiazem (CARDIZEM) 30 MG tablet  Take 30 mg by mouth 2 (two) times daily.  03/27/13   Yes  Historical Provider, MD   epoetin alfa (EPOGEN,PROCRIT) 93790 UNIT/ML injection  Inject 30,000 Units into the skin every 21 ( twenty-one) days.    Yes  Historical Provider,  MD   furosemide (LASIX) 40 MG tablet  Take 40 mg by mouth daily.  06/21/13   Yes  Historical Provider, MD   metoprolol (LOPRESSOR) 50 MG tablet  Take 50 mg by mouth every evening.  06/21/13   Yes  Historical Provider, MD   mycophenolate (CELLCEPT) 250 MG capsule  Take 1,000 mg by mouth 2 (two) times daily.    Yes  Historical Provider, MD   pantoprazole (PROTONIX) 40 MG tablet  Take 40 mg by mouth 2 (two) times daily.    Yes  Historical Provider, MD   predniSONE (DELTASONE) 5 MG tablet  Take 5 mg by mouth daily.    Yes  Historical Provider, MD   PROGRAF 1 MG capsule  Take 4-5 mg by mouth 2 (two) times daily. $RemoveBefo'4mg'bQhuFfFHEpg$  in the morning and $RemoveBef'5mg'LkTCYjiqDZ$  in the evening.  06/21/13   Yes  Historical Provider, MD   Physical Exam:  Filed Vitals:    11/20/13 1735   BP:    Pulse:    Temp:  98.6 F (37 C)   Resp:     BP 111/63  Pulse 112  Temp(Src) 98.6 F (37 C) (Oral)  Resp 18  Ht $R'5\' 9"'gr$  (1.753 m)  Wt 90.6 kg (199 lb 11.8 oz)  BMI 29.48 kg/m2  SpO2 100%  LMP 11/01/2013   General: Appears calm, febrile, complaining of nausea  Eyes: PERRL, normal lids, irises & conjunctiva  ENT: grossly normal hearing, dry MM, no erythema or exudates inside her mouth  Neck: no LAD, masses or thyromegaly, no JVD  Cardiovascular: tachycardic, no rubs or gallops  Respiratory: CTA bilaterally, no w/r/r. Normal respiratory effort.  Abdomen: soft, diffuse tendernes, nd, positive BS  Skin: no rash, petechiae or induration seen on exam  Musculoskeletal: grossly normal tone BUE/BLE  Psychiatric: grossly normal mood and affect, speech fluent and appropriate  Neurologic: grossly non-focal.          Labs on Admission:  Basic Metabolic Panel:   Recent Labs  Lab  11/20/13 1246   NA  137   K  3.7   CL  98   CO2  22   GLUCOSE  119*   BUN  16   CREATININE  2.16*   CALCIUM  8.3*    Liver Function Tests:   Recent Labs  Lab  11/20/13 1246   AST  19   ALT  13   ALKPHOS  60   BILITOT  0.4   PROT  7.9   ALBUMIN  3.7    CBC:   Recent Labs  Lab  11/20/13 1246   WBC  16.3*   HGB  10.3*   HCT  31.9*   MCV  77.1*   PLT  229    BNP (last 3 results)  No results found for this basename: PROBNP, in the last 8760 hours   Assessment/Plan  1-Sepsis: patient with temp 102.2; WBC's 16,000; HR 115 and abnormal urine suggesting UTI.  -will admit to step down  -check urine culture, lactic acid and blood cx's  -start zosyn  -given some ongoing myalgias, URI and high fever, will check resp viral panel and place patient on droplet precautions.  -will provide IVF's  -supportive care   2-Renal failure (ARF), acute on chronic: hx of transplant in the past.  -baseline Cr 1.2  -start IVF's  -follow urine culture  -check cellcept level  -will continue cellcept, prograft and steroids  -renal has been consulted will follow rec's  -check  renal US   3-GERD (gastroesophageal reflux disease): continue PPI   4-Fever and leukocytosis: due to #1; treatment as mentioned above    5-UTI (urinary tract infection): follow urine cultures; started on zosyn   6-cough, SOB and Myalgia: with high grade fever will be also concern for flu.  -will start tamiflu if patient neg on resp panel will d/c  7-HTN: stable. Will continue b-blocker. Given settings of early sepsis   DVT: heparin   Renal service (Dr. Arty Baumgartner)   Code Status: Full  Family Communication: husband at bedside  Disposition Plan: inpatient, LOS > 2 days; stepdown   Time spent: 46 minutes  [facility]  Pager [phone]

## 2013-11-20 NOTE — ED Provider Notes (Signed)
CSN: [id number]     Arrival date & time 11/20/13  1208 History   First MD Initiated Contact with Patient 11/20/13 1353     Chief Complaint  Patient presents with  . Fever  . Generalized Body Aches     (Consider location/radiation/quality/duration/timing/severity/associated sxs/prior Treatment) The history is provided by the patient and medical records.   Patient with hx renal transplant in 2013 presents with fever, chills, myalgias x 2 days and malodorous urine, frequency and urgency x 1 day.  Has episode of lower abdominal pain last night that has resolved.  Had dry cough and SOB last night that has resolved.  Denies CP.  Denies abnormal vaginal discharge or bleeding.  Has had two loose bowel movement last night, no blood.  LMP Jan 28 was on time and normal.   Nephrologist is Dr Deterding PCP is Delphina Cahill [facility])  Past Medical History  Diagnosis Date  . Hypertension   . Poor appetite   . 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 GERD (gastroesophageal reflux disease) Dec. 2010    ulcerative esophagitis per EGD   . Secondary hyperparathyroidism     s/p parathyroidectomy with autotransplantation to left arm per Dr. Baker Pierini on 08/04/11  . Streptococcal peritonitis  Sept. 2012   Past Surgical History  Procedure Laterality Date  . Arm graphs      three - done at [facility]  . Parathyroidectomy  08/05/11    with graft of parathyroid into left upper arm.  . Kidney transplant Right 2013   Family History  Problem Relation Age of Onset  . Hypertension Mother   . Hypertension Father   . Obesity Son   . Cancer Maternal Uncle   . Diabetes Maternal Grandmother   . Stroke Maternal Grandfather    History  Substance Use Topics  . Smoking status: Former Research scientist (life sciences)  . Smokeless tobacco: Never Used  . Alcohol Use: Yes     Comment: Once per month   OB History   Grav Para Term Preterm Abortions TAB SAB Ect Mult Living                  Review of Systems  Constitutional: Positive for chills. Negative for fever.  Respiratory: Negative for cough and shortness of breath.   Cardiovascular: Negative for chest pain.  Gastrointestinal: Negative for nausea, vomiting, abdominal pain and diarrhea.  Genitourinary: Negative for dysuria, urgency, frequency, vaginal bleeding and vaginal discharge.  All other systems reviewed and are negative.      Allergies  Nickel  Home Medications   Current Outpatient Rx  Name  Route  Sig  Dispense  Refill  . acetaminophen (TYLENOL) 325 MG tablet   Oral   Take 650 mg by mouth every 6 (six) hours as needed. For pain          . amLODipine (NORVASC) 5 MG tablet   Oral   Take 5 mg by mouth every morning.         . calcitRIOL (ROCALTROL) 0.5 MCG capsule   Oral   Take 0.5 mcg by mouth 2 (two) times daily.         . calcium carbonate (TUMS - DOSED IN MG ELEMENTAL CALCIUM) 500 MG chewable tablet   Oral   Chew 3 tablets by mouth 3 (three) times daily.         . cholecalciferol (VITAMIN D) 400 UNITS TABS tablet   Oral  Take 1,000 Units by mouth daily.         Marland Kitchen diltiazem (CARDIZEM) 30 MG tablet   Oral   Take 30 mg by mouth 2 (two) times daily.         Marland Kitchen epoetin alfa (EPOGEN,PROCRIT) 35465 UNIT/ML injection   Subcutaneous   Inject 30,000 Units into the skin every 21 ( twenty-one) days.         . furosemide (LASIX) 40 MG tablet   Oral   Take 40 mg by mouth daily.         . metoprolol (LOPRESSOR) 50 MG tablet   Oral   Take 50 mg by mouth every evening.         . mycophenolate (CELLCEPT) 250 MG capsule   Oral   Take 1,000 mg by mouth 2 (two) times daily.         . pantoprazole (PROTONIX) 40 MG tablet   Oral   Take 40 mg by mouth 2 (two) times daily.          . predniSONE (DELTASONE) 5 MG tablet   Oral   Take 5 mg by mouth daily.         Marland Kitchen PROGRAF 1 MG capsule   Oral   Take 5 mg by mouth 2 (two) times daily.          BP 122/75  Pulse 108   Temp(Src) 99.4 F (37.4 C) (Oral)  Resp 18  Wt 200 lb 2 oz (90.776 kg)  SpO2 100%  LMP 11/01/2013 Physical Exam  Nursing note and vitals reviewed. Constitutional: She appears well-developed and well-nourished. No distress.  HENT:  Head: Normocephalic and atraumatic.  Neck: Neck supple.  Cardiovascular: Normal rate and regular rhythm.   Pulmonary/Chest: Effort normal and breath sounds normal. No respiratory distress. She has no wheezes. She has no rales.  Abdominal: Soft. She exhibits no distension. There is no tenderness. There is no rebound and no guarding.  Neurological: She is alert.  Skin: She is not diaphoretic.    ED Course  Procedures (including critical care time) Labs Review Labs Reviewed  CBC - Abnormal; Notable for the following:    WBC 16.3 (*)    Hemoglobin 10.3 (*)    HCT 31.9 (*)    MCV 77.1 (*)    MCH 24.9 (*)    RDW 16.8 (*)    All other components within normal limits  COMPREHENSIVE METABOLIC PANEL - Abnormal; Notable for the following:    Glucose, Bld 119 (*)    Creatinine, Ser 2.16 (*)    Calcium 8.3 (*)    GFR calc non Af Amer 28 (*)    GFR calc Af Amer 33 (*)    All other components within normal limits  URINALYSIS, ROUTINE W REFLEX MICROSCOPIC - Abnormal; Notable for the following:    Color, Urine AMBER (*)    APPearance TURBID (*)    Hgb urine dipstick MODERATE (*)    Bilirubin Urine SMALL (*)    Ketones, ur 15 (*)    Protein, ur 100 (*)    Leukocytes, UA MODERATE (*)    All other components within normal limits  URINE MICROSCOPIC-ADD ON - Abnormal; Notable for the following:    Squamous Epithelial / LPF FEW (*)    Bacteria, UA MANY (*)    Casts GRANULAR CAST (*)    All other components within normal limits  RESPIRATORY VIRUS PANEL  LACTIC ACID, PLASMA  POCT PREGNANCY, URINE   Imaging  Review No results found.  EKG Interpretation   None      Discussed patient with Dr Dorna Mai.   2:58 PM Discussed patient with on call nephrologist  who prefers medical admission, will consult.   3:24 PM I spoke with Dr Dyann Kief, [facility], who will admit the patient.  I have placed holding orders for Inpatient Stepdown, team 10.  Discussed antibiotic choice with Dr Dyann Kief.    MDM   Final diagnoses:  Sepsis  Pyelonephritis  Renal transplant disorder    Pt with hx renal transplant 2013 p/w fever, myalgias, malodorous urine and urinary frequency/urgency.  Found to be septic- tachycardic and febrile with leukocytosis, +UTI.  Urine culture pending.  Zosyn ordered.  Admitted to hospitalist, nephrology to consult.     Clayton Bibles, PA-C 11/20/13 1635

## 2013-11-21 ENCOUNTER — Inpatient Hospital Stay (HOSPITAL_COMMUNITY): Payer: Medicare Other

## 2013-11-21 DIAGNOSIS — D72829 Elevated white blood cell count, unspecified: Secondary | ICD-10-CM

## 2013-11-21 LAB — CBC
HEMATOCRIT: 27.3 % — AB (ref 36.0–46.0)
Hemoglobin: 8.7 g/dL — ABNORMAL LOW (ref 12.0–15.0)
MCH: 24.6 pg — ABNORMAL LOW (ref 26.0–34.0)
MCHC: 31.9 g/dL (ref 30.0–36.0)
MCV: 77.3 fL — AB (ref 78.0–100.0)
Platelets: 138 10*3/uL — ABNORMAL LOW (ref 150–400)
RBC: 3.53 MIL/uL — ABNORMAL LOW (ref 3.87–5.11)
RDW: 17.2 % — ABNORMAL HIGH (ref 11.5–15.5)
WBC: 11.1 10*3/uL — ABNORMAL HIGH (ref 4.0–10.5)

## 2013-11-21 LAB — RESPIRATORY VIRUS PANEL
Adenovirus: NOT DETECTED
INFLUENZA A H1: NOT DETECTED
INFLUENZA B 1: NOT DETECTED
Influenza A H3: NOT DETECTED
Influenza A: NOT DETECTED
Metapneumovirus: NOT DETECTED
PARAINFLUENZA 2 A: NOT DETECTED
PARAINFLUENZA 3 A: NOT DETECTED
Parainfluenza 1: NOT DETECTED
RESPIRATORY SYNCYTIAL VIRUS B: NOT DETECTED
Respiratory Syncytial Virus A: NOT DETECTED
Rhinovirus: NOT DETECTED

## 2013-11-21 LAB — BASIC METABOLIC PANEL
BUN: 23 mg/dL (ref 6–23)
CALCIUM: 7.8 mg/dL — AB (ref 8.4–10.5)
CO2: 21 mEq/L (ref 19–32)
Chloride: 102 mEq/L (ref 96–112)
Creatinine, Ser: 2.33 mg/dL — ABNORMAL HIGH (ref 0.50–1.10)
GFR, EST AFRICAN AMERICAN: 30 mL/min — AB (ref >90)
GFR, EST NON AFRICAN AMERICAN: 26 mL/min — AB (ref >90)
Glucose, Bld: 128 mg/dL — ABNORMAL HIGH (ref 70–99)
Potassium: 4 mEq/L (ref 3.7–5.3)
Sodium: 138 mEq/L (ref 137–147)

## 2013-11-21 LAB — PROTEIN / CREATININE RATIO, URINE
Creatinine, Urine: 219.04 mg/dL
Protein Creatinine Ratio: 0.19 — ABNORMAL HIGH (ref 0.00–0.15)
Total Protein, Urine: 41.5 mg/dL

## 2013-11-21 NOTE — Consult Note (Signed)
Indication for Consultation:  Management of ESRD/hemodialysis; anemia, hypertension/volume and secondary hyperparathyroidism  HPI: Raven Walker is a 33 y.o. female with a history of kidney transplant in 06/2012 who presented to the ED with fever, aches, fatigue, and feeling lightheaded and weak since Saturday night. She has also been having loose stools and decreased appetite. She reports urinary frequency and urgency as well as noting that her urine looked much darker, but no change in the amount of urine. She had an episode similar to this back in November when she had a UTI. She has not had any comlpications since transplant, her creat has been running around 1.2-2. She follows with Dr Jimmy Footman and was last seen on 10/11/13. She conts to follow with [facility] transplant. Also, she has been having heavy menses and has been seen by Dr Glo Herring who recommends transvaginal US and possible endo ablation.  Past Medical History  Diagnosis Date  . Hypertension   . Poor appetite   . 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 GERD (gastroesophageal reflux disease) Dec. 2010    ulcerative esophagitis per EGD   . Secondary hyperparathyroidism     s/p parathyroidectomy with autotransplantation to left arm per Dr. Baker Pierini on 08/04/11  . Streptococcal peritonitis  Sept. 2012   Past Surgical History  Procedure Laterality Date  . Arm graphs      three - done at [facility]  . Parathyroidectomy  08/05/11    with graft of parathyroid into left upper arm.  . Kidney transplant Right 2013   Family History  Problem Relation Age of Onset  . Hypertension Mother   . Hypertension Father   . Obesity Son   . Cancer Maternal Uncle   . Diabetes Maternal Grandmother   . Stroke Maternal Grandfather    Social History:  reports that she has quit smoking. She has never used smokeless tobacco. She reports that she drinks alcohol. She reports that she does not use illicit  drugs. Allergies  Allergen Reactions  . Nickel Itching and Rash    Only where touched   Prior to Admission medications   Medication Sig Start Date End Date Taking? Authorizing Provider  acetaminophen (TYLENOL) 325 MG tablet Take 650 mg by mouth every 6 (six) hours as needed. For pain    Yes Historical Provider, MD  calcitRIOL (ROCALTROL) 0.5 MCG capsule Take 0.5 mcg by mouth 2 (two) times daily. 04/25/13  Yes Historical Provider, MD  calcium carbonate (TUMS - DOSED IN MG ELEMENTAL CALCIUM) 500 MG chewable tablet Chew 3 tablets by mouth 3 (three) times daily.   Yes Historical Provider, MD  cholecalciferol (VITAMIN D) 400 UNITS TABS tablet Take 1,000 Units by mouth daily.   Yes Historical Provider, MD  diltiazem (CARDIZEM) 30 MG tablet Take 30 mg by mouth 2 (two) times daily. 03/27/13  Yes Historical Provider, MD  epoetin alfa (EPOGEN,PROCRIT) 01093 UNIT/ML injection Inject 30,000 Units into the skin every 21 ( twenty-one) days.   Yes Historical Provider, MD  furosemide (LASIX) 40 MG tablet Take 40 mg by mouth daily. 06/21/13  Yes Historical Provider, MD  metoprolol (LOPRESSOR) 50 MG tablet Take 50 mg by mouth every evening. 06/21/13  Yes Historical Provider, MD  mycophenolate (CELLCEPT) 250 MG capsule Take 1,000 mg by mouth 2 (two) times daily.   Yes Historical Provider, MD  pantoprazole (PROTONIX) 40 MG tablet Take 40 mg by mouth 2 (two) times daily.  Yes Historical Provider, MD  predniSONE (DELTASONE) 5 MG tablet Take 5 mg by mouth daily.   Yes Historical Provider, MD  PROGRAF 1 MG capsule Take 4-5 mg by mouth 2 (two) times daily. $RemoveBefo'4mg'KSfGYackMoG$  in the morning and $RemoveBef'5mg'hHbEokgPGC$  in the evening. 06/21/13  Yes Historical Provider, MD   Current Facility-Administered Medications  Medication Dose Route Frequency Provider Last Rate Last Dose  . 0.9 %  sodium chloride infusion   Intravenous Continuous Barton Dubois, MD 100 mL/hr at 11/21/13 0235    . acetaminophen (TYLENOL) tablet 650 mg  650 mg Oral Q6H PRN Barton Dubois,  MD       Or  . acetaminophen (TYLENOL) suppository 650 mg  650 mg Rectal Q6H PRN Barton Dubois, MD      . calcitRIOL (ROCALTROL) capsule 0.5 mcg  0.5 mcg Oral BID Barton Dubois, MD   0.5 mcg at 11/21/13 1051  . calcium carbonate (TUMS - dosed in mg elemental calcium) chewable tablet 600 mg of elemental calcium  3 tablet Oral TID Barton Dubois, MD   600 mg of elemental calcium at 11/21/13 1051  . cholecalciferol (VITAMIN D) tablet 1,000 Units  1,000 Units Oral Daily Barton Dubois, MD   1,000 Units at 11/21/13 1051  . heparin injection 5,000 Units  5,000 Units Subcutaneous 3 times per day Barton Dubois, MD   5,000 Units at 11/21/13 0700  . metoprolol (LOPRESSOR) tablet 50 mg  50 mg Oral QPM Barton Dubois, MD      . morphine 2 MG/ML injection 1 mg  1 mg Intravenous Q3H PRN Barton Dubois, MD   1 mg at 11/20/13 2136  . mycophenolate (CELLCEPT) capsule 1,000 mg  1,000 mg Oral BID Barton Dubois, MD   1,000 mg at 11/21/13 1051  . ondansetron (ZOFRAN) tablet 4 mg  4 mg Oral Q6H PRN Barton Dubois, MD       Or  . ondansetron [facility]) injection 4 mg  4 mg Intravenous Q6H PRN Barton Dubois, MD      . oseltamivir (TAMIFLU) capsule 75 mg  75 mg Oral Daily Barton Dubois, MD   75 mg at 11/21/13 1051  . pantoprazole (PROTONIX) EC tablet 40 mg  40 mg Oral BID Barton Dubois, MD   40 mg at 11/21/13 1051  . piperacillin-tazobactam (ZOSYN) IVPB 2.25 g  2.25 g Intravenous Q8H Charmian Muff [place], RPH   2.25 g at 11/21/13 1123  . predniSONE (DELTASONE) tablet 5 mg  5 mg Oral Daily Barton Dubois, MD   5 mg at 11/21/13 1051  . tacrolimus (PROGRAF) capsule 4 mg  4 mg Oral q morning - 10a Barton Dubois, MD   4 mg at 11/21/13 1051   And  . tacrolimus (PROGRAF) capsule 5 mg  5 mg Oral QPM Barton Dubois, MD   5 mg at 11/20/13 2123   Labs: Basic Metabolic Panel:  Recent Labs Lab 11/20/13 1246 11/20/13 1935 11/21/13 0319  NA 137  --  138  K 3.7  --  4.0  CL 98  --  102  CO2 22  --  21  GLUCOSE 119*  --  128*  BUN  16  --  23  CREATININE 2.16* 2.44* 2.33*  CALCIUM 8.3*  --  7.8*  PHOS  --  3.4  --    Liver Function Tests:  Recent Labs Lab 11/20/13 1246  AST 19  ALT 13  ALKPHOS 60  BILITOT 0.4  PROT 7.9  ALBUMIN 3.7   No results found for  this basename: LIPASE, AMYLASE,  in the last 168 hours No results found for this basename: AMMONIA,  in the last 168 hours CBC:  Recent Labs Lab 11/20/13 1246 11/20/13 1935 11/21/13 0319  WBC 16.3* 12.5* 11.1*  HGB 10.3* 8.9* 8.7*  HCT 31.9* 27.7* 27.3*  MCV 77.1* 77.2* 77.3*  PLT 229 192 138*   Cardiac Enzymes: No results found for this basename: CKTOTAL, CKMB, CKMBINDEX, TROPONINI,  in the last 168 hours CBG: No results found for this basename: GLUCAP,  in the last 168 hours Iron Studies: No results found for this basename: IRON, TIBC, TRANSFERRIN, FERRITIN,  in the last 72 hours Studies/Results: No results found.  ROS:  Review of Systems: Gen: Reports fever, chills, anorexia, fatigue, weakness, malaise, HEENT: No visual complaints, No history of Retinopathy. Normal external appearance No Epistaxis or Sore throat. No sinusitis.   CV: Denies chest pain, angina, palpitations, syncope, orthopnea, PND, peripheral edema, and claudication. Resp: Denies dyspnea at rest, dyspnea with exercise, cough, sputum, wheezing, coughing up blood, and pleurisy. GI: Reports decreased appetite, nausea, and diarrhea. Denies abd pain GU : Denies urinary burning, blood in urine. Reports urinary frequency, urinary urgency and dark urine MS: Reports muscle aches.  No use of non steroidal antiinflammatory drugs. Derm: Denies rash, itching, dry skin, hives, moles, warts, or unhealing ulcers.  Psych: Denies depression, anxiety, memory loss, suicidal ideation, hallucinations, paranoia, and confusion. Heme: Denies bruising, bleeding, and enlarged lymph nodes. Neuro: No headache.  No diplopia. No dysarthria.  No dysphasia.  No history of CVA.  No Seizures. No  paresthesias.  Endocrine No DM.  No Thyroid disease.  No Adrenal disease.  Physical Exam: Filed Vitals:   11/21/13 0001 11/21/13 0344 11/21/13 0713 11/21/13 1134  BP: 111/69 112/71 131/63 119/71  Pulse: 94 83 74 91  Temp: 98.4 F (36.9 C) 97.9 F (36.6 C) 97.7 F (36.5 C) 98.4 F (36.9 C)  TempSrc: Oral Oral Oral Oral  Resp: $Remo'14 22 15 25  'OvxZJ$ Height:      Weight:      SpO2: 99% 98% 99% 99%     General: Well developed, well nourished, in no acute distress.  Head: Normocephalic, atraumatic, sclera non-icteric, mucus membranes are moist Neck: Supple. JVD not elevated. Lungs: Clear bilaterally to auscultation without wheezes, rales, or rhonchi. Breathing is unlabored. Heart: RRR with S1 S2, S4 No murmurs, rubs, or gallops appreciated. Abdomen: Soft, non-distended with normoactive bowel sounds. Tender to palpation over RLQ. Healed incisional scars M-S:  Strength and tone appear normal for age. Lower extremities: trace LE edema Neuro: Alert and oriented X 3. Moves all extremities spontaneously. Psych:  Responds to questions appropriately with a normal affect.   Assessment/Plan: 1. Sepsis- febrile on admission, tmax 102.2. WBC 16,000. Afebrile and feeling better now, WBC 11000. UA + leukocytes, WBC and bacteria..Blood culture and viral panel pending. Started on zosyn- per primary 2.  ESRD -  ARF, history of renal transplant 9/13 at [place]. Creat 2.33- baseline 1.2-2. Prograf, cellcept and prednisone. Will check Tac trough. Renal US pending 3.  Hypertension/volume  - 119/71, metoprolol 4.  Anemia  - hgb 8.7, occasionally requires IV iron, last Feraheme $RemoveBefor'1020mg'aOXJSwguvBWa$  iv 1/15 (hbg was 9.8 on 2/11) 5.  Metabolic bone disease -  Hx of parathyroidectomy. Ca+ 7.8. Calcitriol and tums 6.  Nutrition - poor intake since Saturday, appetite improving.   Shelle Iron, NP [facility] [phone] 11/21/2013, 1:12 PM     I have seen and examined this patient  and agree with plan as  outlined by Shelle Iron, NP.  Mrs. Weatherford reports that she developed a feeling of weakness last week and had labs drawn which were normal but then developed high fevers to 104 on Saturday.  She had similar presentation in  November and already feels better today.  Old AVG's without evidence of infection.  Cultures pending.  Cont with current plan and keep prograf dose the same as her outpt dose and follow. Draycen Leichter A,MD 11/21/2013 3:43 PM

## 2013-11-21 NOTE — Progress Notes (Signed)
[facility] PROGRESS NOTE  AIDE WOJNAR OYD:[id number] DOB: 10-17-1980 DOA: 11/20/2013 PCP: Delphina Cahill, MD  Assessment/Plan: 1-Sepsis: patient with temp 102.2; WBC's 16,000; HR 115 and abnormal urine suggesting UTI on admission. -resolving -now afebrile for 10 hours; feeling better and WBC's down to 11,000 range -follow blood and urine cx -lactic acid normal -continue zosyn  -given some ongoing myalgias, URI symptoms and high fever, will follow resp viral panel and continue tamiflu and droplet precautions.  -will continue IVF's and supportive care   2-Renal failure (ARF), acute on chronic: hx of transplant in the past.  -baseline Cr 1.2-1.4  -continue IVF's and follow renal US -follow renal service rec's -follow urine culture  -follow cellcept level  -will continue cellcept, prograft and steroids at same dose for now  3-GERD (gastroesophageal reflux disease): continue PPI   4-Fever and leukocytosis: due to #1; treatment as mentioned above   5-UTI (urinary tract infection): follow urine cultures; continue on zosyn   6-cough, SOB and Myalgia: with high grade fever will be also concern for flu.  -will continue tamiflu for now, keep patient on droplet and follow resp panel results   7-HTN: stable. Will continue b-blocker for now and slowly reintroduce rest of her antihypertensive drugs   DVT: heparin   Code Status: Full Family Communication: no family at bedside Disposition Plan: transfer to telemetry    Consultants:  Renal service   Procedures:  See below for x-ray report   Antibiotics:  Zosyn   HPI/Subjective: Feeling better; denies nausea or vomiting. Patient afebrile for the last 10 hours  Objective: Filed Vitals:   11/21/13 1134  BP: 119/71  Pulse: 91  Temp: 98.4 F (36.9 C)  Resp: 25    Intake/Output Summary (Last 24 hours) at 11/21/13 1231 Last data filed at 11/21/13 1200  Gross per 24 hour  Intake   2640 ml  Output    800 ml   Net   1840 ml   Filed Weights   11/20/13 1228 11/20/13 1634  Weight: 90.776 kg (200 lb 2 oz) 90.6 kg (199 lb 11.8 oz)    Exam:   General:  Feeling better; denies nausea an vomiting; currently afebrile  Cardiovascular: mild tachycardia, no rubs or gallops  Respiratory: scattered rhonchi; otherwise CTA  Abdomen: soft, ND, positive BS  Musculoskeletal: no cyanosis or clubbing   Data Reviewed: Basic Metabolic Panel:  Recent Labs Lab 11/20/13 1246 11/20/13 1935 11/21/13 0319  NA 137  --  138  K 3.7  --  4.0  CL 98  --  102  CO2 22  --  21  GLUCOSE 119*  --  128*  BUN 16  --  23  CREATININE 2.16* 2.44* 2.33*  CALCIUM 8.3*  --  7.8*  MG  --  1.7  --   PHOS  --  3.4  --    Liver Function Tests:  Recent Labs Lab 11/20/13 1246  AST 19  ALT 13  ALKPHOS 60  BILITOT 0.4  PROT 7.9  ALBUMIN 3.7   CBC:  Recent Labs Lab 11/20/13 1246 11/20/13 1935 11/21/13 0319  WBC 16.3* 12.5* 11.1*  HGB 10.3* 8.9* 8.7*  HCT 31.9* 27.7* 27.3*  MCV 77.1* 77.2* 77.3*  PLT 229 192 138*   BNP (last 3 results) No results found for this basename: PROBNP,  in the last 8760 hours CBG: No results found for this basename: GLUCAP,  in the last 168 hours  Recent Results (from the past 240 hour(s))  MRSA PCR SCREENING     Status: None   Collection Time    11/20/13  4:57 PM      Result Value Ref Range Status   MRSA by PCR NEGATIVE  NEGATIVE Final   Comment:            The GeneXpert MRSA Assay (FDA     approved for NASAL specimens     only), is one component of a     comprehensive MRSA colonization     surveillance program. It is not     intended to diagnose MRSA     infection nor to guide or     monitor treatment for     MRSA infections.     Studies: No results found.  Scheduled Meds: . calcitRIOL  0.5 mcg Oral BID  . calcium carbonate  3 tablet Oral TID  . cholecalciferol  1,000 Units Oral Daily  . heparin  5,000 Units Subcutaneous 3 times per day  . metoprolol  50  mg Oral QPM  . mycophenolate  1,000 mg Oral BID  . oseltamivir  75 mg Oral Daily  . pantoprazole  40 mg Oral BID  . piperacillin-tazobactam (ZOSYN)  IV  2.25 g Intravenous Q8H  . predniSONE  5 mg Oral Daily  . tacrolimus  4 mg Oral q morning - 10a   And  . tacrolimus  5 mg Oral QPM   Continuous Infusions: . sodium chloride 100 mL/hr at 11/21/13 0235     Time spent: >30 minutes    Jd Mccaster  [facility] Pager [phone]. If 7PM-7AM, please contact night-coverage at [email or web address], password [facility] 11/21/2013, 12:31 PM  LOS: 1 day

## 2013-11-21 NOTE — Progress Notes (Signed)
Utilization review completed.  

## 2013-11-21 NOTE — Progress Notes (Signed)
Pt admitted to unit [facility] from [place], received report from [place], [place]. Pt is alert and oriented to staff, call bell, and room. Family at bedside. Bed in lowest position. Call bell within reach. Full assessment to Epic. Will continue to monitor.

## 2013-11-21 NOTE — Progress Notes (Signed)
Patient being transferred to [place] per MD order, Report called to Noah [place], [place]. Patient will transfer on tele to a tele bed. Alert and oriented and all VSS,

## 2013-11-21 NOTE — Clinical Documentation Improvement (Signed)
Possible Clinical Conditions? Please clarify diagnosis due conflicting supporting information.    _______CKD Stage I - GFR > OR = 90 _______CKD Stage II - GFR 60-80 _______CKD Stage III - GFR 30-59 _______CKD Stage IV - GFR 15-29 _______CKD Stage V - GFR < 15 _______ESRD (End Stage Renal Disease) _______Other condition_____________ _______Cannot Clinically determine   Supporting Information:  Per 11/20/13 H&P, progress notes with PMH of HTN, ESRD (s/p transplant; no CKD stage 2-3 with baseline Cr 1.2).  Thank You, Serena Colonel ,RN Clinical Documentation Specialist:  [place] Information Management

## 2013-11-22 LAB — CBC
HCT: 28.1 % — ABNORMAL LOW (ref 36.0–46.0)
HEMOGLOBIN: 8.9 g/dL — AB (ref 12.0–15.0)
MCH: 24.5 pg — AB (ref 26.0–34.0)
MCHC: 31.7 g/dL (ref 30.0–36.0)
MCV: 77.2 fL — ABNORMAL LOW (ref 78.0–100.0)
Platelets: 194 10*3/uL (ref 150–400)
RBC: 3.64 MIL/uL — ABNORMAL LOW (ref 3.87–5.11)
RDW: 17.3 % — ABNORMAL HIGH (ref 11.5–15.5)
WBC: 8.9 10*3/uL (ref 4.0–10.5)

## 2013-11-22 LAB — RENAL FUNCTION PANEL
Albumin: 2.9 g/dL — ABNORMAL LOW (ref 3.5–5.2)
BUN: 26 mg/dL — ABNORMAL HIGH (ref 6–23)
CALCIUM: 8.7 mg/dL (ref 8.4–10.5)
CHLORIDE: 104 meq/L (ref 96–112)
CO2: 22 meq/L (ref 19–32)
Creatinine, Ser: 1.85 mg/dL — ABNORMAL HIGH (ref 0.50–1.10)
GFR calc non Af Amer: 34 mL/min — ABNORMAL LOW (ref >90)
GFR, EST AFRICAN AMERICAN: 40 mL/min — AB (ref >90)
GLUCOSE: 105 mg/dL — AB (ref 70–99)
Phosphorus: 4.1 mg/dL (ref 2.3–4.6)
Potassium: 3.6 mEq/L — ABNORMAL LOW (ref 3.7–5.3)
SODIUM: 140 meq/L (ref 137–147)

## 2013-11-22 MED ORDER — CIPROFLOXACIN HCL 500 MG PO TABS
500.0000 mg | ORAL_TABLET | Freq: Two times a day (BID) | ORAL | Status: DC
  Administered 2013-11-22 – 2013-11-23 (×2): 500 mg via ORAL
  Filled 2013-11-22 (×5): qty 1

## 2013-11-22 MED ORDER — PIPERACILLIN-TAZOBACTAM IN DEX 2-0.25 GM/50ML IV SOLN
2.2500 g | Freq: Three times a day (TID) | INTRAVENOUS | Status: DC
  Administered 2013-11-22: 2.25 g via INTRAVENOUS
  Filled 2013-11-22 (×2): qty 50

## 2013-11-22 MED ORDER — POTASSIUM CHLORIDE CRYS ER 20 MEQ PO TBCR
30.0000 meq | EXTENDED_RELEASE_TABLET | Freq: Once | ORAL | Status: AC
  Administered 2013-11-22: 30 meq via ORAL
  Filled 2013-11-22: qty 1

## 2013-11-22 MED ORDER — PIPERACILLIN-TAZOBACTAM 3.375 G IVPB
3.3750 g | Freq: Three times a day (TID) | INTRAVENOUS | Status: DC
  Filled 2013-11-22 (×2): qty 50

## 2013-11-22 NOTE — Progress Notes (Signed)
Subjective:   Feeling better. Tolerating more po fluid.  Abd pain much improved. Thinks antibiotics are really helping.  Objective Filed Vitals:   11/21/13 1851 11/21/13 2143 11/22/13 0601 11/22/13 0921  BP: 117/73 124/83 125/74 122/79  Pulse: 78 82 76 81  Temp: 98.7 F (37.1 C) 98.4 F (36.9 C) 98.8 F (37.1 C) 99.2 F (37.3 C)  TempSrc:  Oral Oral Oral  Resp: $Remo'18 17 16 18  'kAfxC$ Height:      Weight:  91.808 kg (202 lb 6.4 oz)    SpO2: 99% 98% 100% 100%   Physical Exam General: Well developed, well nourished, in no acute distress. Pleasant and cooperative Heart:RRR with S1 S2, S4 No murmurs, rubs, or gallops appreciated. Lungs: Clear bilaterally to auscultation without wheezes, rales, or rhonchi. Breathing is unlabored. Abdomen: Soft, nontender. non-distended with normoactive bowel sounds  Extremities: no edema  Assessment/Plan:  1. Sepsis- febrile and WBC 16,000 on admission. . Afebrile and feeling better now, WBC 8.9. UA + leukocytes, WBC and bacteria. Less urinary frequency and urine starting to clear per pt. Blood culture- no growth to date. and viral panel negative Started on zosyn- per primary 2. ESRD - ARF, history of renal transplant 9/13 at [place]. Creat improving now 1.85- baseline 1.2-2. Prograf, cellcept and prednisone.  Renal US- no hydronephrosis.  3. Hypertension/volume - 122/79, metoprolol 4. Anemia - hgb 8.9, watch CBC, occasionally requires IV iron, last Feraheme $RemoveBefor'1020mg'HzCTuSBBZMYg$  iv 1/15 (hbg was 9.8 on 2/11).  5. 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 Ca+ 8.7 Calcitriol and tums 6. Nutrition - poor intake since Saturday, appetite improving.    Shelle Iron, NP [facility] [phone] 11/22/2013,12:06 PM  LOS: 2 days    Additional Objective Labs: Basic Metabolic Panel:  Recent Labs Lab 11/20/13 1246 11/20/13 1935 11/21/13 0319 11/22/13 0519  NA 137  --  138 140  K 3.7  --  4.0 3.6*  CL 98  --  102 104  CO2 22  --  21 22  GLUCOSE  119*  --  128* 105*  BUN 16  --  23 26*  CREATININE 2.16* 2.44* 2.33* 1.85*  CALCIUM 8.3*  --  7.8* 8.7  PHOS  --  3.4  --  4.1   Liver Function Tests:  Recent Labs Lab 11/20/13 1246 11/22/13 0519  AST 19  --   ALT 13  --   ALKPHOS 60  --   BILITOT 0.4  --   PROT 7.9  --   ALBUMIN 3.7 2.9*   No results found for this basename: LIPASE, AMYLASE,  in the last 168 hours CBC:  Recent Labs Lab 11/20/13 1246 11/20/13 1935 11/21/13 0319 11/22/13 0519  WBC 16.3* 12.5* 11.1* 8.9  HGB 10.3* 8.9* 8.7* 8.9*  HCT 31.9* 27.7* 27.3* 28.1*  MCV 77.1* 77.2* 77.3* 77.2*  PLT 229 192 138* 194   Blood Culture    Component Value Date/Time   SDES BLOOD RIGHT HAND 11/20/2013 1941   SPECREQUEST BOTTLES DRAWN AEROBIC ONLY 5CC 11/20/2013 1941   CULT  Value:        BLOOD CULTURE RECEIVED NO GROWTH TO DATE CULTURE WILL BE HELD FOR 5 DAYS BEFORE ISSUING A FINAL NEGATIVE REPORT Performed at [place] 11/20/2013 1941   REPTSTATUS PENDING 11/20/2013 1941    Cardiac Enzymes: No results found for this basename: CKTOTAL, CKMB, CKMBINDEX, TROPONINI,  in the last 168 hours CBG: No results found for this basename: GLUCAP,  in the last 168  hours Iron Studies: No results found for this basename: IRON, TIBC, TRANSFERRIN, FERRITIN,  in the last 72 hours $Remove'@lablastinr3'yodrDFb$ @ Studies/Results: US Renal  11/21/2013   CLINICAL DATA:  Acute on chronic renal failure.  EXAM: RENAL/URINARY TRACT ULTRASOUND COMPLETE  COMPARISON:  CT abdomen and pelvis 06/28/2013.  FINDINGS: Right Kidney:  Not visualized.  The right kidney is extremely atrophic on prior CT.  Left Kidney:  Length: 7.6 cm. There is increased cortical echogenicity. No hydronephrosis  Right lower quadrant renal transplant:  Length: 11.3 cm. No stone or hydronephrosis. A simple 1.8 cm cyst is identified. Cortical echogenicity is unremarkable. No fluid collection about the transplant is identified.  Bladder:  Appears normal for degree of bladder distention.   Other findings: There appears to be a cystic lesion in the left hemipelvis maybe in the left ovary measuring 4.5 cm in diameter. Fibroid uterus is noted.  IMPRESSION: Left lower quadrant renal transplant without hydronephrosis or other abnormality. Small, 1.8 cm simple cyst is incidentally noted.  Atrophic native kidneys.  Fibroid uterus and possible left ovarian cyst. These findings could be better evaluated with a dedicated pelvic ultrasound.   Electronically Signed   By: Inge Rise M.D.   On: 11/21/2013 16:23   Medications:   . calcitRIOL  0.5 mcg Oral BID  . calcium carbonate  3 tablet Oral TID  . cholecalciferol  1,000 Units Oral Daily  . heparin  5,000 Units Subcutaneous 3 times per day  . metoprolol  50 mg Oral QPM  . mycophenolate  1,000 mg Oral BID  . pantoprazole  40 mg Oral BID  . piperacillin-tazobactam (ZOSYN)  IV  2.25 g Intravenous Q8H  . predniSONE  5 mg Oral Daily  . tacrolimus  4 mg Oral q morning - 10a   And  . tacrolimus  5 mg Oral QPM     I have seen and examined this patient and agree with plan as outlined by Shelle Iron, NP.  Pt feels much better, unfortunately no urine culture was sent at time of admission despite having fever.  Would like to narrow abx but unclear what organism is offending agent.  Will follow blood cultures. Creig Landin A,MD 11/22/2013 12:57 PM

## 2013-11-22 NOTE — Progress Notes (Signed)
[facility] PROGRESS NOTE  Raven Walker PXT:[id number] DOB: 06/20/1981 DOA: 11/20/2013 PCP: Delphina Cahill, MD  Assessment/Plan: Principal Problem:   Sepsis: Blood cultures negative to date. Source likely urine. No evidence of pneumonia  URI: Tamiflu discontinued after flu PCR negative. Continue supportive care. Likely viral   Active Problems:    GERD (gastroesophageal reflux disease)   History of renal transplant: Continue CellCept and Prograf and steroids   Fever: Afebrile since admission.   UTI (urinary tract infection): No urine cultures noted. Clearly responding to Zosyn. Now that white count has normalized, we'll change to by mouth.    Myalgia: Improving. Will increase ambulation.   Acute on chronic stage II renal failure: Appreciate nephrology help. Renal ultrasound unremarkable. Back to baseline.    Leukocytosis, unspecified: Secondary to sepsis. Resolved with antibiotics  Code Status: Full code Family Communication: Husband at the bedside, a sleep Disposition Plan: Home likely the next 24 hours   Consultants:  Nephrology  Procedures:  None  Antibiotics:  Tamiflu 2/16-2/17  Zosyn 2/16-present   HPI/Subjective: Patient feeling weaker this morning, generalized malaise and some dysuria. Also complains of leg cramping.  Objective: Filed Vitals:   11/22/13 0921  BP: 122/79  Pulse: 81  Temp: 99.2 F (37.3 C)  Resp: 18    Intake/Output Summary (Last 24 hours) at 11/22/13 1236 Last data filed at 11/22/13 0900  Gross per 24 hour  Intake   1260 ml  Output    300 ml  Net    960 ml   Filed Weights   11/20/13 1228 11/20/13 1634 11/21/13 2143  Weight: 90.776 kg (200 lb 2 oz) 90.6 kg (199 lb 11.8 oz) 91.808 kg (202 lb 6.4 oz)    Exam:   General:  Alert and oriented x3, no acute distress  Cardiovascular: Regular rate and rhythm, S1-S2  Respiratory: Clear to auscultation bilaterally  Abdomen: Soft, nontender, nondistended, positive bowel  sounds  Musculoskeletal: No clubbing or cyanosis or edema   Data Reviewed: Basic Metabolic Panel:  Recent Labs Lab 11/20/13 1246 11/20/13 1935 11/21/13 0319 11/22/13 0519  NA 137  --  138 140  K 3.7  --  4.0 3.6*  CL 98  --  102 104  CO2 22  --  21 22  GLUCOSE 119*  --  128* 105*  BUN 16  --  23 26*  CREATININE 2.16* 2.44* 2.33* 1.85*  CALCIUM 8.3*  --  7.8* 8.7  MG  --  1.7  --   --   PHOS  --  3.4  --  4.1   Liver Function Tests:  Recent Labs Lab 11/20/13 1246 11/22/13 0519  AST 19  --   ALT 13  --   ALKPHOS 60  --   BILITOT 0.4  --   PROT 7.9  --   ALBUMIN 3.7 2.9*   No results found for this basename: LIPASE, AMYLASE,  in the last 168 hours No results found for this basename: AMMONIA,  in the last 168 hours CBC:  Recent Labs Lab 11/20/13 1246 11/20/13 1935 11/21/13 0319 11/22/13 0519  WBC 16.3* 12.5* 11.1* 8.9  HGB 10.3* 8.9* 8.7* 8.9*  HCT 31.9* 27.7* 27.3* 28.1*  MCV 77.1* 77.2* 77.3* 77.2*  PLT 229 192 138* 194   Cardiac Enzymes: No results found for this basename: CKTOTAL, CKMB, CKMBINDEX, TROPONINI,  in the last 168 hours BNP (last 3 results) No results found for this basename: PROBNP,  in the last 8760 hours CBG: No results  found for this basename: GLUCAP,  in the last 168 hours  Recent Results (from the past 240 hour(s))  MRSA PCR SCREENING     Status: None   Collection Time    11/20/13  4:57 PM      Result Value Ref Range Status   MRSA by PCR NEGATIVE  NEGATIVE Final   Comment:            The GeneXpert MRSA Assay (FDA     approved for NASAL specimens     only), is one component of a     comprehensive MRSA colonization     surveillance program. It is not     intended to diagnose MRSA     infection nor to guide or     monitor treatment for     MRSA infections.  RESPIRATORY VIRUS PANEL     Status: None   Collection Time    11/20/13  7:19 PM      Result Value Ref Range Status   Source - RVPAN NASAL SWAB   Corrected   Comment:  CORRECTED ON 02/17 AT 2148: PREVIOUSLY REPORTED AS NASAL SWAB   Respiratory Syncytial Virus A NOT DETECTED   Final   Respiratory Syncytial Virus B NOT DETECTED   Final   Influenza A NOT DETECTED   Final   Influenza B NOT DETECTED   Final   Parainfluenza 1 NOT DETECTED   Final   Parainfluenza 2 NOT DETECTED   Final   Parainfluenza 3 NOT DETECTED   Final   Metapneumovirus NOT DETECTED   Final   Rhinovirus NOT DETECTED   Final   Adenovirus NOT DETECTED   Final   Influenza A H1 NOT DETECTED   Final   Influenza A H3 NOT DETECTED   Final   Comment: (NOTE)           Normal Reference Range for each Analyte: NOT DETECTED     Testing performed using the Luminex xTAG Respiratory Viral Panel test     kit.     This test was developed and its performance characteristics determined     by [facility]. It has not been cleared or approved by the [place]     Food and Drug Administration. This test is used for clinical purposes.     It should not be regarded as investigational or for research. This     laboratory is certified under the [place] (CLIA) as qualified to perform high complexity     clinical laboratory testing.     Performed at [facility], BLOOD (ROUTINE X 2)     Status: None   Collection Time    11/20/13  7:35 PM      Result Value Ref Range Status   Specimen Description BLOOD RIGHT ARM   Final   Special Requests BOTTLES DRAWN AEROBIC AND ANAEROBIC 4CC   Final   Culture  Setup Time     Final   Value: 11/21/2013 00:45     Performed at [facility]   Culture     Final   Value:        BLOOD CULTURE RECEIVED NO GROWTH TO DATE CULTURE WILL BE HELD FOR 5 DAYS BEFORE ISSUING A FINAL NEGATIVE REPORT     Performed at [facility]   Report Status PENDING   Incomplete  CULTURE, BLOOD (ROUTINE X 2)     Status: None  Collection Time    11/20/13  7:41 PM      Result Value Ref Range Status   Specimen Description  BLOOD RIGHT HAND   Final   Special Requests BOTTLES DRAWN AEROBIC ONLY 5CC   Final   Culture  Setup Time     Final   Value: 11/21/2013 00:44     Performed at [facility]   Culture     Final   Value:        BLOOD CULTURE RECEIVED NO GROWTH TO DATE CULTURE WILL BE HELD FOR 5 DAYS BEFORE ISSUING A FINAL NEGATIVE REPORT     Performed at [facility]   Report Status PENDING   Incomplete     Studies: US Renal  11/21/2013   CLINICAL DATA:  Acute on chronic renal failure.  EXAM: RENAL/URINARY TRACT ULTRASOUND COMPLETE  COMPARISON:  CT abdomen and pelvis 06/28/2013.  FINDINGS: Right Kidney:  Not visualized.  The right kidney is extremely atrophic on prior CT.  Left Kidney:  Length: 7.6 cm. There is increased cortical echogenicity. No hydronephrosis  Right lower quadrant renal transplant:  Length: 11.3 cm. No stone or hydronephrosis. A simple 1.8 cm cyst is identified. Cortical echogenicity is unremarkable. No fluid collection about the transplant is identified.  Bladder:  Appears normal for degree of bladder distention.  Other findings: There appears to be a cystic lesion in the left hemipelvis maybe in the left ovary measuring 4.5 cm in diameter. Fibroid uterus is noted.  IMPRESSION: Left lower quadrant renal transplant without hydronephrosis or other abnormality. Small, 1.8 cm simple cyst is incidentally noted.  Atrophic native kidneys.  Fibroid uterus and possible left ovarian cyst. These findings could be better evaluated with a dedicated pelvic ultrasound.   Electronically Signed   By: Inge Rise M.D.   On: 11/21/2013 16:23    Scheduled Meds: . calcitRIOL  0.5 mcg Oral BID  . calcium carbonate  3 tablet Oral TID  . cholecalciferol  1,000 Units Oral Daily  . heparin  5,000 Units Subcutaneous 3 times per day  . metoprolol  50 mg Oral QPM  . mycophenolate  1,000 mg Oral BID  . pantoprazole  40 mg Oral BID  . piperacillin-tazobactam (ZOSYN)  IV  2.25 g Intravenous Q8H  .  predniSONE  5 mg Oral Daily  . tacrolimus  4 mg Oral q morning - 10a   And  . tacrolimus  5 mg Oral QPM   Continuous Infusions:   Principal Problem:   Sepsis Active Problems:   Renal failure (ARF), acute on chronic   GERD (gastroesophageal reflux disease)   History of renal transplant   Fever   UTI (urinary tract infection)   Myalgia   Acute on chronic renal failure   Leukocytosis, unspecified    Time spent: 25 minutes    [facility] Pager [phone]. If 7PM-7AM, please contact night-coverage at [email or web address], password [facility] 11/22/2013, 12:36 PM  LOS: 2 days

## 2013-11-23 LAB — URINE CULTURE
COLONY COUNT: NO GROWTH
CULTURE: NO GROWTH

## 2013-11-23 LAB — BASIC METABOLIC PANEL
BUN: 20 mg/dL (ref 6–23)
CALCIUM: 8.6 mg/dL (ref 8.4–10.5)
CO2: 22 mEq/L (ref 19–32)
CREATININE: 1.55 mg/dL — AB (ref 0.50–1.10)
Chloride: 107 mEq/L (ref 96–112)
GFR calc Af Amer: 49 mL/min — ABNORMAL LOW (ref >90)
GFR, EST NON AFRICAN AMERICAN: 42 mL/min — AB (ref >90)
GLUCOSE: 135 mg/dL — AB (ref 70–99)
Potassium: 3.3 mEq/L — ABNORMAL LOW (ref 3.7–5.3)
Sodium: 143 mEq/L (ref 137–147)

## 2013-11-23 LAB — TACROLIMUS LEVEL: Tacrolimus (FK506) - LabCorp: 3.8 ng/mL

## 2013-11-23 MED ORDER — POTASSIUM CHLORIDE CRYS ER 20 MEQ PO TBCR
40.0000 meq | EXTENDED_RELEASE_TABLET | Freq: Two times a day (BID) | ORAL | Status: DC
  Administered 2013-11-23: 40 meq via ORAL
  Filled 2013-11-23: qty 2

## 2013-11-23 MED ORDER — CIPROFLOXACIN HCL 500 MG PO TABS: 500.0000 mg | ORAL_TABLET | Freq: Two times a day (BID) | ORAL | Status: AC

## 2013-11-23 NOTE — Progress Notes (Signed)
Patient ID: Raven Walker, female   DOB: Mar 25, 1981, 33 y.o.   MRN: [id number] S:pt feels better O:BP 133/88  Pulse 64  Temp(Src) 98 F (36.7 C) (Oral)  Resp 18  Ht $R'5\' 9"'GW$  (1.753 m)  Wt 92.08 kg (203 lb)  BMI 29.96 kg/m2  SpO2 98%  LMP 11/01/2013  Intake/Output Summary (Last 24 hours) at 11/23/13 1026 Last data filed at 11/23/13 0900  Gross per 24 hour  Intake    720 ml  Output      0 ml  Net    720 ml   Intake/Output: I/O last 3 completed shifts: In: 960 [P.O.:960] Out: -   Intake/Output this shift:  Total I/O In: 240 [P.O.:240] Out: -  Weight change: 0.272 kg (9.6 oz) Gen:WD WN AAF in NAD CVS:no rub Resp:cta OHY:WVPXTG Ext:no edema   Recent Labs Lab 11/20/13 1246 11/20/13 1935 11/21/13 0319 11/22/13 0519 11/23/13 0354  NA 137  --  138 140 143  K 3.7  --  4.0 3.6* 3.3*  CL 98  --  102 104 107  CO2 22  --  $R'21 22 22  'lf$ GLUCOSE 119*  --  128* 105* 135*  BUN 16  --  23 26* 20  CREATININE 2.16* 2.44* 2.33* 1.85* 1.55*  ALBUMIN 3.7  --   --  2.9*  --   CALCIUM 8.3*  --  7.8* 8.7 8.6  PHOS  --  3.4  --  4.1  --   AST 19  --   --   --   --   ALT 13  --   --   --   --    Liver Function Tests:  Recent Labs Lab 11/20/13 1246 11/22/13 0519  AST 19  --   ALT 13  --   ALKPHOS 60  --   BILITOT 0.4  --   PROT 7.9  --   ALBUMIN 3.7 2.9*   No results found for this basename: LIPASE, AMYLASE,  in the last 168 hours No results found for this basename: AMMONIA,  in the last 168 hours CBC:  Recent Labs Lab 11/20/13 1246 11/20/13 1935 11/21/13 0319 11/22/13 0519  WBC 16.3* 12.5* 11.1* 8.9  HGB 10.3* 8.9* 8.7* 8.9*  HCT 31.9* 27.7* 27.3* 28.1*  MCV 77.1* 77.2* 77.3* 77.2*  PLT 229 192 138* 194   Cardiac Enzymes: No results found for this basename: CKTOTAL, CKMB, CKMBINDEX, TROPONINI,  in the last 168 hours CBG: No results found for this basename: GLUCAP,  in the last 168 hours  Iron Studies: No results found for this basename: IRON, TIBC,  TRANSFERRIN, FERRITIN,  in the last 72 hours Studies/Results: US Renal  11/21/2013   CLINICAL DATA:  Acute on chronic renal failure.  EXAM: RENAL/URINARY TRACT ULTRASOUND COMPLETE  COMPARISON:  CT abdomen and pelvis 06/28/2013.  FINDINGS: Right Kidney:  Not visualized.  The right kidney is extremely atrophic on prior CT.  Left Kidney:  Length: 7.6 cm. There is increased cortical echogenicity. No hydronephrosis  Right lower quadrant renal transplant:  Length: 11.3 cm. No stone or hydronephrosis. A simple 1.8 cm cyst is identified. Cortical echogenicity is unremarkable. No fluid collection about the transplant is identified.  Bladder:  Appears normal for degree of bladder distention.  Other findings: There appears to be a cystic lesion in the left hemipelvis maybe in the left ovary measuring 4.5 cm in diameter. Fibroid uterus is noted.  IMPRESSION: Left lower quadrant renal transplant without hydronephrosis or other abnormality.  Small, 1.8 cm simple cyst is incidentally noted.  Atrophic native kidneys.  Fibroid uterus and possible left ovarian cyst. These findings could be better evaluated with a dedicated pelvic ultrasound.   Electronically Signed   By: Inge Rise M.D.   On: 11/21/2013 16:23   . calcitRIOL  0.5 mcg Oral BID  . calcium carbonate  3 tablet Oral TID  . cholecalciferol  1,000 Units Oral Daily  . ciprofloxacin  500 mg Oral BID  . heparin  5,000 Units Subcutaneous 3 times per day  . metoprolol  50 mg Oral QPM  . mycophenolate  1,000 mg Oral BID  . pantoprazole  40 mg Oral BID  . potassium chloride  40 mEq Oral BID  . predniSONE  5 mg Oral Daily  . tacrolimus  4 mg Oral q morning - 10a   And  . tacrolimus  5 mg Oral QPM    BMET    Component Value Date/Time   NA 143 11/23/2013 0354   K 3.3* 11/23/2013 0354   CL 107 11/23/2013 0354   CO2 22 11/23/2013 0354   GLUCOSE 135* 11/23/2013 0354   BUN 20 11/23/2013 0354   CREATININE 1.55* 11/23/2013 0354   CALCIUM 8.6 11/23/2013 0354    CALCIUM 5.9* 08/19/2011 0704   GFRNONAA 42* 11/23/2013 0354   GFRAA 49* 11/23/2013 0354   CBC    Component Value Date/Time   WBC 8.9 11/22/2013 0519   WBC 4.5 02/01/2009 1456   RBC 3.64* 11/22/2013 0519   RBC 3.22* 06/29/2013 0028   RBC 4.22 02/01/2009 1456   HGB 8.9* 11/22/2013 0519   HGB 11.2* 02/01/2009 1456   HCT 28.1* 11/22/2013 0519   HCT 34.2* 02/01/2009 1456   PLT 194 11/22/2013 0519   PLT 221 02/01/2009 1456   MCV 77.2* 11/22/2013 0519   MCV 80.9 02/01/2009 1456   MCH 24.5* 11/22/2013 0519   MCH 26.6 02/01/2009 1456   MCHC 31.7 11/22/2013 0519   MCHC 32.8 02/01/2009 1456   RDW 17.3* 11/22/2013 0519   RDW 16.9* 02/01/2009 1456   LYMPHSABS 1.0 08/30/2013 0141   LYMPHSABS 1.7 02/01/2009 1456   MONOABS 1.1* 08/30/2013 0141   MONOABS 0.3 02/01/2009 1456   EOSABS 0.0 08/30/2013 0141   EOSABS 0.3 02/01/2009 1456   BASOSABS 0.0 08/30/2013 0141   BASOSABS 0.0 02/01/2009 1456     Assessment/Plan:  1. Sepsis- febrile and WBC 16,000 on admission. . Afebrile and feeling better now, likely UTI although no urine culture sent at time of admission for unclear reasons. 1. Improving after abx with less urinary frequency and urine starting to clear per pt.  2. Blood culture- no growth to date. and viral panel negative  3. Started on po cipro and hopeful discharge later today- per primary 2. ESRD s/p DDKT- ARF, history of renal transplant 9/13 at [place]. Creat improving now 1.55- baseline 1.2-2.  1. Prograf, cellcept and prednisone.  2. Renal US- no hydronephrosis.  3. Hypertension/volume - 122/79, metoprolol 4. Anemia - hgb 8.9, watch CBC, occasionally requires IV iron, last Feraheme $RemoveBefor'1020mg'vhMPRulcUGTo$  iv 1/15 (hbg was 9.8 on 2/11).  5. 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 Ca+ 8.7 Calcitriol and tums 6. Nutrition - poor intake since Saturday, appetite improving.  7. Hypokalemia- being repleted 8. Will sign off and can f/u with Dr. Jimmy Footman as an outpt and will arrange repeat K level next week with our  office.  Please feel free to call with questions or concerns 9.  [place] [facility]

## 2013-11-23 NOTE — Progress Notes (Signed)
Patient to be discharged to home with spouse. PIV removed. Discharge instructions and new medications reviewed with patient. All belongings returned to patient.  Aaron Edelman, Doak Mah [place]

## 2013-11-23 NOTE — Discharge Summary (Signed)
Physician Discharge Summary  Raven Walker OZD:[id number] DOB: 07/03/1981 DOA: 11/20/2013  PCP: Delphina Cahill, MD  Admit date: 11/20/2013 Discharge date: 11/23/2013  Time spent: 25 minutes  Recommendations for Outpatient Follow-up:  1. New medication: Cipro 500 by mouth twice a day x2 more days for total of 7 days of therapy 2. Patient will followup with her PCP as needed 3. Patient will follow up with nephrology next week for repeat potassium level  Discharge Diagnoses:  Principal Problem:   Sepsis Active Problems:   Renal failure (ARF), acute on chronic   GERD (gastroesophageal reflux disease)   History of renal transplant   Fever   UTI (urinary tract infection)   Myalgia   Acute on chronic renal failure   Leukocytosis, unspecified   Discharge Condition: Improved, being discharged home  Diet recommendation: Low-sodium  Filed Weights   11/20/13 1634 11/21/13 2143 11/22/13 2249  Weight: 90.6 kg (199 lb 11.8 oz) 91.808 kg (202 lb 6.4 oz) 92.08 kg (203 lb)    History of present illness:  33 year old African American female past medical history of hypertension and end-stage renal disease status post renal transplant who came to the emergency room on 2/16 with complaints of fevers, chills and nausea vomiting. Patient was found to have large urinary tract infection her white count is 16,000, urinalysis suggestive of UTI, fever of 102.2, tachycardia and creatinine of 2.16 with a baseline around 1.5. Patient was admitted for sepsis from UTI to the hospitalist service.  Hospital Course:  Principal Problem:   Sepsis secondary to UTI: Patient initially placed on Tamiflu if she was also having some upper respiratory infection symptoms. Flu titers were negative and this was discontinued she was started on IV Zosyn. Blood cultures are negative to date. Urine cultures were not sent, however lactic acid level was normal. After several days, white blood cell count fully resolved and other  vital signs stabilized. Patient was then changed over to by mouth antibiotics. She is felt medically stable for discharge 2/19. She'll continue 2 more days of antibiotics for total of 7 days of therapy.  Active Problems:   Renal failure (ARF), acute on chronic: Patient continue to see her IV fluids and by day of discharge, creatinine was down to 1.55. Nephrology felt she was stable.   GERD (gastroesophageal reflux disease)   History of renal transplant: Stable. Patient was continued on CellCept, Prograf and daily prednisone.    Fever: Secondary to sepsis. Resolved with antibiotics    UTI (urinary tract infection): See above. No organism identified.    Myalgia: To be secondary to dehydration. Resolved with fluids.  Hypokalemia: With fluid resuscitation, patient started sprinting leg cramping. Potassium levels were on the low end of normal, supplemental potassium, symptoms resolved. Patient will followup next week with nephrology about summary the potassium level will be checked.  Procedures:  None  Consultations:  None  Discharge Exam: Filed Vitals:   11/23/13 0923  BP: 133/88  Pulse: 64  Temp: 98 F (36.7 C)  Resp: 18    General: Alert and oriented x3, fatigue Cardiovascular: Regular rate and rhythm, S1-S2 Respiratory: Clear to auscultation bilaterally  Discharge Instructions  Discharge Orders   Future Appointments Provider Department Dept Phone   11/29/2013 10:30 AM [facility] [phone]   11/29/2013 11:00 AM Jonnie Kind, MD [facility] [phone]   Future Orders Complete By Expires   Diet - low sodium heart healthy  As directed    Increase activity slowly  As directed        Medication List         acetaminophen 325 MG tablet  Commonly known as:  TYLENOL  Take 650 mg by mouth every 6 (six) hours as needed. For pain     calcitRIOL 0.5 MCG capsule  Commonly known as:  ROCALTROL  Take 0.5 mcg by mouth 2 (two) times  daily.     calcium carbonate 500 MG chewable tablet  Commonly known as:  TUMS - dosed in mg elemental calcium  Chew 3 tablets by mouth 3 (three) times daily.     cholecalciferol 400 UNITS Tabs tablet  Commonly known as:  VITAMIN D  Take 1,000 Units by mouth daily.     ciprofloxacin 500 MG tablet  Commonly known as:  CIPRO  Take 1 tablet (500 mg total) by mouth 2 (two) times daily.     diltiazem 30 MG tablet  Commonly known as:  CARDIZEM  Take 30 mg by mouth 2 (two) times daily.     epoetin alfa 10000 UNIT/ML injection  Commonly known as:  EPOGEN,PROCRIT  Inject 30,000 Units into the skin every 21 ( twenty-one) days.     furosemide 40 MG tablet  Commonly known as:  LASIX  Take 40 mg by mouth daily.     metoprolol 50 MG tablet  Commonly known as:  LOPRESSOR  Take 50 mg by mouth every evening.     mycophenolate 250 MG capsule  Commonly known as:  CELLCEPT  Take 1,000 mg by mouth 2 (two) times daily.     pantoprazole 40 MG tablet  Commonly known as:  PROTONIX  Take 40 mg by mouth 2 (two) times daily.     predniSONE 5 MG tablet  Commonly known as:  DELTASONE  Take 5 mg by mouth daily.     PROGRAF 1 MG capsule  Generic drug:  tacrolimus  Take 4-5 mg by mouth 2 (two) times daily. $RemoveBefo'4mg'afKCSbtgpnI$  in the morning and $RemoveBef'5mg'rwoGcwsGFQ$  in the evening.       Allergies  Allergen Reactions  . Nickel Itching and Rash    Only where touched       Follow-up Information   Follow up with Delphina Cahill, MD. (As needed)    Specialty:  Internal Medicine   Contact information:    [place] [address] [phone]       Follow up with DETERDING,JAMES L, MD In 1 week.   Specialty:  Nephrology   Contact information:   [place] [address] [phone]        The results of significant diagnostics from this hospitalization (including imaging, microbiology, ancillary and laboratory) are listed below for reference.    Significant Diagnostic Studies: US  Renal  11/21/2013   CLINICAL DATA:  Acute on chronic renal failure.  EXAM: RENAL/URINARY TRACT ULTRASOUND COMPLETE  COMPARISON:  CT abdomen and pelvis 06/28/2013.  FINDINGS: Right Kidney:  Not visualized.  The right kidney is extremely atrophic on prior CT.  Left Kidney:  Length: 7.6 cm. There is increased cortical echogenicity. No hydronephrosis  Right lower quadrant renal transplant:  Length: 11.3 cm. No stone or hydronephrosis. A simple 1.8 cm cyst is identified. Cortical echogenicity is unremarkable. No fluid collection about the transplant is identified.  Bladder:  Appears normal for degree of bladder distention.  Other findings: There appears to be a cystic lesion in the left hemipelvis maybe in the left ovary measuring 4.5 cm in diameter.  Fibroid uterus is noted.  IMPRESSION: Left lower quadrant renal transplant without hydronephrosis or other abnormality. Small, 1.8 cm simple cyst is incidentally noted.  Atrophic native kidneys.  Fibroid uterus and possible left ovarian cyst. These findings could be better evaluated with a dedicated pelvic ultrasound.   Electronically Signed   By: Inge Rise M.D.   On: 11/21/2013 16:23    Microbiology: Recent Results (from the past 240 hour(s))  MRSA PCR SCREENING     Status: None   Collection Time    11/20/13  4:57 PM      Result Value Ref Range Status   MRSA by PCR NEGATIVE  NEGATIVE Final   Comment:            The GeneXpert MRSA Assay (FDA     approved for NASAL specimens     only), is one component of a     comprehensive MRSA colonization     surveillance program. It is not     intended to diagnose MRSA     infection nor to guide or     monitor treatment for     MRSA infections.  RESPIRATORY VIRUS PANEL     Status: None   Collection Time    11/20/13  7:19 PM      Result Value Ref Range Status   Source - RVPAN NASAL SWAB   Corrected   Comment: CORRECTED ON 02/17 AT 2148: PREVIOUSLY REPORTED AS NASAL SWAB   Respiratory Syncytial Virus A NOT  DETECTED   Final   Respiratory Syncytial Virus B NOT DETECTED   Final   Influenza A NOT DETECTED   Final   Influenza B NOT DETECTED   Final   Parainfluenza 1 NOT DETECTED   Final   Parainfluenza 2 NOT DETECTED   Final   Parainfluenza 3 NOT DETECTED   Final   Metapneumovirus NOT DETECTED   Final   Rhinovirus NOT DETECTED   Final   Adenovirus NOT DETECTED   Final   Influenza A H1 NOT DETECTED   Final   Influenza A H3 NOT DETECTED   Final   Comment: (NOTE)           Normal Reference Range for each Analyte: NOT DETECTED     Testing performed using the Luminex xTAG Respiratory Viral Panel test     kit.     This test was developed and its performance characteristics determined     by [facility]. It has not been cleared or approved by the [place]     Food and Drug Administration. This test is used for clinical purposes.     It should not be regarded as investigational or for research. This     laboratory is certified under the [place] (CLIA) as qualified to perform high complexity     clinical laboratory testing.     Performed at [place], BLOOD (ROUTINE X 2)     Status: None   Collection Time    11/20/13  7:35 PM      Result Value Ref Range Status   Specimen Description BLOOD RIGHT ARM   Final   Special Requests BOTTLES DRAWN AEROBIC AND ANAEROBIC 4CC   Final   Culture  Setup Time     Final   Value: 11/21/2013 00:45     Performed at [facility]   Culture     Final   Value:  BLOOD CULTURE RECEIVED NO GROWTH TO DATE CULTURE WILL BE HELD FOR 5 DAYS BEFORE ISSUING A FINAL NEGATIVE REPORT     Performed at [facility]   Report Status PENDING   Incomplete  CULTURE, BLOOD (ROUTINE X 2)     Status: None   Collection Time    11/20/13  7:41 PM      Result Value Ref Range Status   Specimen Description BLOOD RIGHT HAND   Final   Special Requests BOTTLES DRAWN AEROBIC ONLY 5CC   Final   Culture   Setup Time     Final   Value: 11/21/2013 00:44     Performed at [facility]   Culture     Final   Value:        BLOOD CULTURE RECEIVED NO GROWTH TO DATE CULTURE WILL BE HELD FOR 5 DAYS BEFORE ISSUING A FINAL NEGATIVE REPORT     Performed at [facility]   Report Status PENDING   Incomplete  URINE CULTURE     Status: None   Collection Time    11/22/13  5:31 PM      Result Value Ref Range Status   Specimen Description URINE, CLEAN CATCH   Final   Special Requests Immunocompromised   Final   Culture  Setup Time     Final   Value: 11/22/2013 19:01     Performed at [facility]     Final   Value: NO GROWTH     Performed at [facility]   Culture     Final   Value: NO GROWTH     Performed at [facility]   Report Status 11/23/2013 FINAL   Final     Labs: Basic Metabolic Panel:  Recent Labs Lab 11/20/13 1246 11/20/13 1935 11/21/13 0319 11/22/13 0519 11/23/13 0354  NA 137  --  138 140 143  K 3.7  --  4.0 3.6* 3.3*  CL 98  --  102 104 107  CO2 22  --  $R'21 22 22  'UV$ GLUCOSE 119*  --  128* 105* 135*  BUN 16  --  23 26* 20  CREATININE 2.16* 2.44* 2.33* 1.85* 1.55*  CALCIUM 8.3*  --  7.8* 8.7 8.6  MG  --  1.7  --   --   --   PHOS  --  3.4  --  4.1  --    Liver Function Tests:  Recent Labs Lab 11/20/13 1246 11/22/13 0519  AST 19  --   ALT 13  --   ALKPHOS 60  --   BILITOT 0.4  --   PROT 7.9  --   ALBUMIN 3.7 2.9*   No results found for this basename: LIPASE, AMYLASE,  in the last 168 hours No results found for this basename: AMMONIA,  in the last 168 hours CBC:  Recent Labs Lab 11/20/13 1246 11/20/13 1935 11/21/13 0319 11/22/13 0519  WBC 16.3* 12.5* 11.1* 8.9  HGB 10.3* 8.9* 8.7* 8.9*  HCT 31.9* 27.7* 27.3* 28.1*  MCV 77.1* 77.2* 77.3* 77.2*  PLT 229 192 138* 194   Cardiac Enzymes: No results found for this basename: CKTOTAL, CKMB, CKMBINDEX, TROPONINI,  in the last 168 hours BNP: BNP (last 3  results) No results found for this basename: PROBNP,  in the last 8760 hours CBG: No results found for this basename: GLUCAP,  in the last 168 hours     Signed:  Annita Brod  [facility] 11/23/2013, 2:31 PM

## 2013-11-24 LAB — MYCOPHENOLIC ACID (CELLCEPT)
MPA Glucuronide: 24 ug/mL — ABNORMAL LOW (ref 35.0–100.0)
MPA: <0.5 ug/mL — CL (ref 1.0–3.5)

## 2013-11-24 NOTE — ED Provider Notes (Signed)
Medical screening examination/treatment/procedure(s) were performed by non-physician practitioner and as supervising physician I was immediately available for consultation/collaboration.   Saddie Benders. Manisha Cancel, MD 11/24/13 6962

## 2013-11-27 LAB — CULTURE, BLOOD (ROUTINE X 2)
Culture: NO GROWTH
Culture: NO GROWTH

## 2013-11-29 ENCOUNTER — Ambulatory Visit: Payer: Medicare Other | Admitting: Obstetrics and Gynecology

## 2013-11-29 ENCOUNTER — Other Ambulatory Visit: Payer: Self-pay | Admitting: Obstetrics and Gynecology

## 2013-11-29 ENCOUNTER — Other Ambulatory Visit: Payer: Medicare Other

## 2013-11-29 DIAGNOSIS — N920 Excessive and frequent menstruation with regular cycle: Secondary | ICD-10-CM

## 2013-12-07 ENCOUNTER — Ambulatory Visit (INDEPENDENT_AMBULATORY_CARE_PROVIDER_SITE_OTHER): Payer: Medicare Other | Admitting: Obstetrics & Gynecology

## 2013-12-07 ENCOUNTER — Other Ambulatory Visit: Payer: Self-pay | Admitting: Obstetrics and Gynecology

## 2013-12-07 ENCOUNTER — Ambulatory Visit (INDEPENDENT_AMBULATORY_CARE_PROVIDER_SITE_OTHER): Payer: Medicare Other

## 2013-12-07 ENCOUNTER — Encounter: Payer: Self-pay | Admitting: Obstetrics & Gynecology

## 2013-12-07 VITALS — BP 128/62 | Ht 69.5 in | Wt 201.0 lb

## 2013-12-07 DIAGNOSIS — N920 Excessive and frequent menstruation with regular cycle: Secondary | ICD-10-CM

## 2013-12-07 DIAGNOSIS — D259 Leiomyoma of uterus, unspecified: Secondary | ICD-10-CM

## 2013-12-07 DIAGNOSIS — Z940 Kidney transplant status: Secondary | ICD-10-CM | POA: Diagnosis not present

## 2013-12-07 NOTE — Progress Notes (Signed)
Patient ID: Raven Walker, female   DOB: 1981-07-03, 33 y.o.   MRN: [id number] Sonogram reviewed and noted  US Pelvis Complete  12/07/2013   GYNECOLOGIC SONOGRAM   Raven Walker is a 33 y.o. 11/29/2013- LMP for a pelvic sonogram  for menorrhagia.  Uterus                      11.8 x 9.0 x 6.9 cm, retroflexed with multiple  fibroids noted (largest fibroid=3.1cm)  Endometrium          12.3 mm, asymmetrical, distorted by fibroids  Right ovary             3.3 x 2.5 x 2.2 cm,   Left ovary                4.0 x 2.8 x 2.3 cm,   No free fluid noted within pelvis  Technician Comments:  Retroflexed uterus with multiple fibroids noted, Endometrium=12.4mm  distorted by fibroids, bilateral adnexa/ovaries appears wnl   [name] 12/07/2013 11:49 AM  Multiple relatively small myomas with 1 or 2 distorting the endometrium,  submucosal Dr Glo Herring to evaluate patient for ablation.  I told patient an ablation  with Holmium laser of endometrial myomas would be appropriate.  Also with  significant PMH would have patient fail an ablation before proceeding to  more invasive surgery which woul increase her complication percentages  Camani Sesay H 12:22 PM 12/07/2013       I talked with patient regarding management.  I would proceed with ablation with the possibility of using the Holmium laser to shave th submucosal myomas if needed the do the ablation  I told pt I would exhaust all conservative management options before proceeding with a more invasive approach  She understands the clinical situation and will follow up with Dr Barnetta Chapel for further planning  Past Medical History  Diagnosis Date  . Hypertension   . Poor appetite   . 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 GERD (gastroesophageal reflux disease) Dec. 2010    ulcerative esophagitis per EGD   . Secondary hyperparathyroidism     s/p parathyroidectomy with autotransplantation to left arm per Dr. Baker Pierini  on 08/04/11  . Streptococcal peritonitis  Sept. 2012    Past Surgical History  Procedure Laterality Date  . Arm graphs      three - done at [facility]  . Parathyroidectomy  08/05/11    with graft of parathyroid into left upper arm.  . Kidney transplant Right 2013    OB History   Grav Para Term Preterm Abortions TAB SAB Ect Mult Living                  Allergies  Allergen Reactions  . Nickel Itching and Rash    Only where touched    History   Social History  . Marital Status: Single    Spouse Name: N/A    Number of Children: N/A  . Years of Education: N/A   Social History Main Topics  . Smoking status: Former Research scientist (life sciences)  . Smokeless tobacco: Never Used  . Alcohol Use: Yes     Comment: Once per month  . Drug Use: No  . Sexual Activity: Yes    Birth Control/ Protection: None   Other Topics Concern  . None   Social History Narrative  . None    Family History  Problem  Relation Age of Onset  . Hypertension Mother   . Hypertension Father   . Obesity Son   . Cancer Maternal Uncle   . Diabetes Maternal Grandmother   . Stroke Maternal Grandfather

## 2013-12-18 ENCOUNTER — Encounter: Payer: Self-pay | Admitting: Obstetrics and Gynecology

## 2013-12-18 ENCOUNTER — Encounter (INDEPENDENT_AMBULATORY_CARE_PROVIDER_SITE_OTHER): Payer: Self-pay

## 2013-12-18 ENCOUNTER — Ambulatory Visit: Payer: Medicare Other | Admitting: Obstetrics and Gynecology

## 2013-12-18 VITALS — BP 124/90 | Ht 69.5 in | Wt 201.6 lb

## 2013-12-21 NOTE — Progress Notes (Signed)
Pt had to reschedule due to delay in being seen , and dental appt in town.

## 2013-12-25 ENCOUNTER — Ambulatory Visit (INDEPENDENT_AMBULATORY_CARE_PROVIDER_SITE_OTHER): Payer: Medicare Other | Admitting: Obstetrics and Gynecology

## 2013-12-25 ENCOUNTER — Encounter (INDEPENDENT_AMBULATORY_CARE_PROVIDER_SITE_OTHER): Payer: Self-pay

## 2013-12-25 ENCOUNTER — Encounter: Payer: Self-pay | Admitting: Obstetrics and Gynecology

## 2013-12-25 VITALS — BP 146/94 | Ht 69.5 in | Wt 203.6 lb

## 2013-12-25 DIAGNOSIS — D250 Submucous leiomyoma of uterus: Secondary | ICD-10-CM | POA: Diagnosis not present

## 2013-12-25 NOTE — Patient Instructions (Signed)
We will proceed toward hysteroscopy with resection of fibroids, and endometrial ablation after your April period.Raven Walker will schedule.

## 2013-12-25 NOTE — Progress Notes (Signed)
Patient ID: Raven Walker, female   DOB: [birth year], 33 y.o.   MRN: [id number]   [facility] Visit  Patient name: Raven Walker MRN [id number]  Date of birth: 1981/01/07  CC & HPI:  Raven Walker is a 33 y.o. female presenting today for decision re: hysteroscopy  And endometrial ablation. She had u/s in office, see DrEure's note detailing multiple small fibriods, which deform endometrium.  ROS:  S/p kidney transplant  Pertinent History Reviewed:  Medical & Surgical Hx:  Reviewed: Significant for  Medications: Reviewed & Updated - see associated section Social History: Reviewed -  reports that she has quit smoking. She has never used smokeless tobacco.  Objective Findings:  Vitals: BP 146/94  Ht 5' 9.5" (1.765 m)  Wt 92.352 kg (203 lb 9.6 oz)  BMI 29.65 kg/m2  LMP 11/29/2013  Physical Examination: General appearance - alert, well appearing, and in no distress, oriented to person, place, and time and normal appearing weight Mental status - alert, oriented to person, place, and time, normal mood, behavior, speech, dress, motor activity, and thought processes Eyes - pupils equal and reactive, extraocular eye movements intact Mouth - mucous membranes moist, pharynx normal without lesions Heart - normal rate, regular rhythm, normal S1, S2, no murmurs, rubs, clicks or gallops Abdomen - soft, nontender, nondistended, no masses or organomegaly Pelvic - examination not indicated   Assessment & Plan:   Will need to arrange hysteroscopic resection of partially exposed fibroid at time of hysteroscopy and endometrial ablation. Will call pt with info once arranged.

## 2014-01-17 NOTE — Telephone Encounter (Signed)
k

## 2014-01-23 DIAGNOSIS — Z940 Kidney transplant status: Secondary | ICD-10-CM | POA: Diagnosis not present

## 2014-01-23 DIAGNOSIS — N390 Urinary tract infection, site not specified: Secondary | ICD-10-CM | POA: Diagnosis not present

## 2014-01-23 DIAGNOSIS — N183 Chronic kidney disease, stage 3 unspecified: Secondary | ICD-10-CM | POA: Diagnosis not present

## 2014-01-23 DIAGNOSIS — D631 Anemia in chronic kidney disease: Secondary | ICD-10-CM | POA: Diagnosis not present

## 2014-01-23 DIAGNOSIS — I129 Hypertensive chronic kidney disease with stage 1 through stage 4 chronic kidney disease, or unspecified chronic kidney disease: Secondary | ICD-10-CM | POA: Diagnosis not present

## 2014-01-23 DIAGNOSIS — N039 Chronic nephritic syndrome with unspecified morphologic changes: Secondary | ICD-10-CM | POA: Diagnosis not present

## 2014-01-23 DIAGNOSIS — E785 Hyperlipidemia, unspecified: Secondary | ICD-10-CM | POA: Diagnosis not present

## 2014-01-23 DIAGNOSIS — Z87440 Personal history of urinary (tract) infections: Secondary | ICD-10-CM | POA: Diagnosis not present

## 2014-01-24 DIAGNOSIS — Z940 Kidney transplant status: Secondary | ICD-10-CM | POA: Diagnosis not present

## 2014-01-24 DIAGNOSIS — N390 Urinary tract infection, site not specified: Secondary | ICD-10-CM | POA: Diagnosis not present

## 2014-02-04 ENCOUNTER — Encounter: Payer: Self-pay | Admitting: Obstetrics and Gynecology

## 2014-02-05 ENCOUNTER — Encounter: Payer: Medicare Other | Admitting: Obstetrics and Gynecology

## 2014-02-05 DIAGNOSIS — D250 Submucous leiomyoma of uterus: Secondary | ICD-10-CM | POA: Insufficient documentation

## 2014-02-16 DIAGNOSIS — Z940 Kidney transplant status: Secondary | ICD-10-CM | POA: Diagnosis not present

## 2014-02-16 DIAGNOSIS — IMO0001 Reserved for inherently not codable concepts without codable children: Secondary | ICD-10-CM | POA: Diagnosis not present

## 2014-02-16 DIAGNOSIS — R509 Fever, unspecified: Secondary | ICD-10-CM | POA: Diagnosis not present

## 2014-02-16 DIAGNOSIS — R6889 Other general symptoms and signs: Secondary | ICD-10-CM | POA: Diagnosis not present

## 2014-02-16 DIAGNOSIS — I10 Essential (primary) hypertension: Secondary | ICD-10-CM | POA: Diagnosis not present

## 2014-02-19 DIAGNOSIS — Z940 Kidney transplant status: Secondary | ICD-10-CM | POA: Diagnosis not present

## 2014-02-19 DIAGNOSIS — N390 Urinary tract infection, site not specified: Secondary | ICD-10-CM | POA: Diagnosis not present

## 2014-02-19 DIAGNOSIS — D631 Anemia in chronic kidney disease: Secondary | ICD-10-CM | POA: Diagnosis not present

## 2014-02-19 DIAGNOSIS — N189 Chronic kidney disease, unspecified: Secondary | ICD-10-CM | POA: Diagnosis not present

## 2014-02-19 DIAGNOSIS — N039 Chronic nephritic syndrome with unspecified morphologic changes: Secondary | ICD-10-CM | POA: Diagnosis not present

## 2014-02-19 DIAGNOSIS — E785 Hyperlipidemia, unspecified: Secondary | ICD-10-CM | POA: Diagnosis not present

## 2014-02-19 DIAGNOSIS — N183 Chronic kidney disease, stage 3 unspecified: Secondary | ICD-10-CM | POA: Diagnosis not present

## 2014-02-19 DIAGNOSIS — Z87440 Personal history of urinary (tract) infections: Secondary | ICD-10-CM | POA: Diagnosis not present

## 2014-02-19 DIAGNOSIS — D649 Anemia, unspecified: Secondary | ICD-10-CM | POA: Diagnosis not present

## 2014-02-28 ENCOUNTER — Other Ambulatory Visit (HOSPITAL_COMMUNITY): Payer: Self-pay | Admitting: *Deleted

## 2014-03-01 ENCOUNTER — Ambulatory Visit (HOSPITAL_COMMUNITY)
Admission: RE | Admit: 2014-03-01 | Discharge: 2014-03-01 | Disposition: A | Discharge status: Home / Self Care | Payer: Medicare Other | Source: Ambulatory Visit | Attending: Nephrology | Admitting: Nephrology

## 2014-03-01 DIAGNOSIS — N189 Chronic kidney disease, unspecified: Secondary | ICD-10-CM | POA: Diagnosis not present

## 2014-03-01 MED ORDER — SODIUM CHLORIDE 0.9 % IV SOLN
510.0000 mg | Freq: Once | INTRAVENOUS | Status: AC
  Administered 2014-03-01: 510 mg via INTRAVENOUS
  Filled 2014-03-01: qty 17

## 2014-03-01 MED ORDER — FERUMOXYTOL INJECTION 510 MG/17 ML
510.0000 mg | Freq: Once | INTRAVENOUS | Status: DC
Start: 2014-03-01 — End: 2014-03-01

## 2014-03-09 ENCOUNTER — Ambulatory Visit (INDEPENDENT_AMBULATORY_CARE_PROVIDER_SITE_OTHER): Payer: Medicare Other | Admitting: Obstetrics and Gynecology

## 2014-03-09 ENCOUNTER — Encounter: Payer: Self-pay | Admitting: Obstetrics and Gynecology

## 2014-03-09 VITALS — BP 110/74 | Ht 69.0 in | Wt 202.0 lb

## 2014-03-09 DIAGNOSIS — N898 Other specified noninflammatory disorders of vagina: Secondary | ICD-10-CM

## 2014-03-09 DIAGNOSIS — N760 Acute vaginitis: Secondary | ICD-10-CM

## 2014-03-09 NOTE — Progress Notes (Signed)
Patient ID: Raven Walker, female   DOB: 17-Jun-1981, 33 y.o.   MRN: [id number]  This chart was scribed by Erling Conte, Medical Scribe, for Dr. Mallory Shirk on 03/09/14 at 12:37 PM. This chart was reviewed by Dr. Mallory Shirk for accuracy.    Assessment:  1. Decision regarding hysteroscopy, resection of fibroids and endometrial ablation.    Plan:  1. Will schedule [place] for after patient menstrual period this month  Subjective:  Raven Walker is a 33 y.o. female No obstetric history on file. who presents for annual exam. Patient's last menstrual period was 02/23/2014.  The patient presents today for decision regarding hysteroscopy, resection of fibroids and endometrial ablation.   due for NMP on 03/26/14  The following portions of the patient's history were reviewed and updated as appropriate: allergies, current medications, past family history, past medical history, past social history, past surgical history and problem list.  Review of Systems No complaints at this time  Objective:  BP 110/74  Ht $R'5\' 9"'lj$  (1.753 m)  Wt 202 lb (91.627 kg)  BMI 29.82 kg/m2  LMP 02/23/2014   BMI: Body mass index is 29.82 kg/(m^2).  General Appearance: Alert, appropriate appearance for age. No acute distress HEENT: Grossly normal Neck / Thyroid:  Cardiovascular: RRR; normal S1, S2, no murmur Lungs: CTA bilaterally Back: No CVA Gastrointestinal: Soft, non-tender, no masses or organomegaly Pelvic Exam: Uterus: retroverted. Minimally enlarged adnexa non tender.  Light white vaginal discharge noted. GC, Chl collected  Lymphatic Exam: Non-palpable nodes in neck, clavicular, axillary, or inguinal regions Skin: no rash or abnormalities Neurologic: Normal gait and speech, no tremor  Psychiatric: Alert and oriented, appropriate affect.  Imaging Review  Raven Walker is a 33 y.o. 11/29/2013- LMP for a pelvic sonogram for menorrhagia.  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 I told patient an ablation with Holmium laser of endometrial myomas would be appropriate. Also with significant PMH would have patient fail an ablation before proceeding to more invasive surgery which woul increase her complication percentages  EURE,LUTHER H  12:22 PM  12/07/2013      Mallory Shirk. MD Pgr [phone] 12:44 PM

## 2014-03-10 LAB — GC/CHLAMYDIA PROBE AMP
CT Probe RNA: NEGATIVE
GC Probe RNA: NEGATIVE

## 2014-03-19 DIAGNOSIS — N390 Urinary tract infection, site not specified: Secondary | ICD-10-CM | POA: Diagnosis not present

## 2014-03-21 ENCOUNTER — Encounter: Payer: Medicare Other | Attending: Nephrology | Admitting: Dietician

## 2014-03-21 ENCOUNTER — Encounter: Payer: Self-pay | Admitting: Dietician

## 2014-03-21 VITALS — Ht 69.0 in | Wt 205.6 lb

## 2014-03-21 DIAGNOSIS — N186 End stage renal disease: Secondary | ICD-10-CM | POA: Diagnosis not present

## 2014-03-21 DIAGNOSIS — E663 Overweight: Secondary | ICD-10-CM | POA: Insufficient documentation

## 2014-03-21 DIAGNOSIS — I120 Hypertensive chronic kidney disease with stage 5 chronic kidney disease or end stage renal disease: Secondary | ICD-10-CM | POA: Diagnosis not present

## 2014-03-21 DIAGNOSIS — N189 Chronic kidney disease, unspecified: Secondary | ICD-10-CM

## 2014-03-21 DIAGNOSIS — N179 Acute kidney failure, unspecified: Secondary | ICD-10-CM | POA: Diagnosis not present

## 2014-03-21 DIAGNOSIS — N2581 Secondary hyperparathyroidism of renal origin: Secondary | ICD-10-CM | POA: Diagnosis not present

## 2014-03-21 DIAGNOSIS — I10 Essential (primary) hypertension: Secondary | ICD-10-CM

## 2014-03-21 DIAGNOSIS — Z713 Dietary counseling and surveillance: Secondary | ICD-10-CM | POA: Diagnosis not present

## 2014-03-21 NOTE — Progress Notes (Signed)
Medical Nutrition Therapy:  Appt start time: 1100 end time:  1200.  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 Pt has sig hx for kidney transplant Sept 2013 at [facility]. 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 Kidney progression is solid since that time, with minimal complications and pt notes minimal concerns from nephrologist. Per pt, primary concern is weight control and prevention of T2DM, with fam hx of T2DM in 2 grandparents and one aunt.  Pt has clearly received a number of nutrition messages from a variety of family, friends, media, etc. That run contrary to nutrition therapy for kidney disease, with little to no prior nutrition education for CKD.   Preferred Learning Style:   No preference indicated   Learning Readiness:   Contemplating  MEDICATIONS: see list.    DIETARY INTAKE: pt reports significant sugar cravings at night. D/C fast food for nearly a year.  Usual eating pattern includes 2-3 meals and 1-2 snacks per day. Everyday foods include sweets, microwave meal.  Avoided foods include pork.    24-hr recall:  B ( AM): banana and cantaloupe with water or diet dr. Reino Kent.  Snk ( AM): none  L ( PM): usually alter, around 2. Usually microwave meal, commonly mac and cheese with a sweet food like an oatmeal cookie.  Snk ( PM): maybe oatmeal cookie or graham crackers D ( PM): salmon and green beans with lots of butter and garlic, with a piece of toast with butter, diet dr. Reino Kent.  Snk ( PM): brownies.  Beverages: D/C mountain dew, regularly diet dr. Reino Kent, no lemonade, no tea. Only tea and diet soda.   Usual physical activity: goes to gym 3-4 times per week. Elliptical 30 minutes, StairMaster 20 minutes and some ab work.   Progress Towards Goal(s):  In progress.   Nutritional Diagnosis:  [place]-3.3 Overweight/obesity As related to frequent intake of high kcal sweets.  As evidenced by diet  recall, BMI>28.    Intervention:  Nutrition counseling. Key aspects included thorough education on protein, potassium, phosphorus, and sodium control through dietary methods with multiple references for foods higher and lower in these nutrients across all food groups. Importance of regular but not excessive protein consumption was emphasized, and utility of vegetarian protein choices for improved kidney function was discussed. General portion control and moderate increase in exercise was encouraged. Pt requested a daily meal and snack layout to follow, which was provided, and is attached below. Pt instructed to follow meal plan making lower potassium, phosphorus, and sodium choices as often as possible. RD also recommended increase in vitamin D intake to 4000 IUs per day.   Teaching Method Utilized:  Visual Auditory  Handouts given during visit include:  CHO control and CKD nutrition  CKD and Potassium, Phosphorus, Sodium  Low Sodium Flavoring Tips  Supplement Recommendations  Barriers to learning/adherence to lifestyle change: preference for sweet foods, lack of prior knowledge  Breakfasts daily: 3-4 oz. Protein or 1-1.5 scoops vegetarian protein powder 1 cup (or 2 servings) starch or up to 1.5 cups fruit or  cup starch plus 1/2 cup fruit  Lunches: 3-4 oz. protein 1 cup starch 2 cups nonstarchy vegetables  Snack (afternoon):  nuts or 1 cup [place] yogurt -1 cup fruit  Dinner: 3-4 oz. protein 1 cup starch 2 cups nonstarchy vegetables  Dessert: 1 cup fruit or 1 small cookie/pastry  Remember to limit Diet Cola/Diet Dr. Reino Kent to 1  can per day.   Exercise at gym 5 days per week at about an hour each day, plus 30 minutes exercise from home on non-gym days.  Demonstrated degree of understanding via:  Teach Back   Monitoring/Evaluation:  Dietary intake, exercise, portion control, and body weight in 2 month(s).

## 2014-03-26 ENCOUNTER — Telehealth: Payer: Self-pay | Admitting: Obstetrics and Gynecology

## 2014-03-26 NOTE — Telephone Encounter (Signed)
Pt advised that scheduling person has been called and left a message and that we are just waiting on the return call from them. I advised the pt that I would call her back tomorrow and let her know either way if we heard from them or not. Pt verbalized understanding.

## 2014-03-27 ENCOUNTER — Encounter (HOSPITAL_COMMUNITY): Payer: Self-pay | Admitting: Pharmacist

## 2014-03-27 NOTE — Telephone Encounter (Signed)
LMOM that [facility] will call her in the next few days to set up her pre op appointment.

## 2014-04-03 DIAGNOSIS — Z940 Kidney transplant status: Secondary | ICD-10-CM | POA: Diagnosis not present

## 2014-04-03 DIAGNOSIS — Z87440 Personal history of urinary (tract) infections: Secondary | ICD-10-CM | POA: Diagnosis not present

## 2014-04-03 DIAGNOSIS — N189 Chronic kidney disease, unspecified: Secondary | ICD-10-CM | POA: Diagnosis not present

## 2014-04-03 DIAGNOSIS — D631 Anemia in chronic kidney disease: Secondary | ICD-10-CM | POA: Diagnosis not present

## 2014-04-04 NOTE — Patient Instructions (Addendum)
   Your procedure is scheduled on:  Tuesday, July 7  Enter through the [employer] of [facility] at:  [place] up the phone at the desk and dial [phone] and inform us of your arrival.  Please call this number if you have any problems the morning of surgery: [phone]  Remember: Do not eat or drink after midnight: Monday Take these medicines the morning of surgery with a SIP OF WATER:  Mycophenolic, prograf, diltiazem, lasix, protonix  Do not wear jewelry, make-up, or FINGER nail polish No metal in your hair or on your body. Do not wear lotions, powders, perfumes.  You may wear deodorant.  Do not bring valuables to the hospital. Contacts, dentures or bridgework may not be worn into surgery.  Patients discharged on the day of surgery will not be allowed to drive home.  Home with mother Elias Else or boyfriend Glendell Docker

## 2014-04-09 ENCOUNTER — Encounter (HOSPITAL_COMMUNITY)
Admission: RE | Admit: 2014-04-09 | Discharge: 2014-04-09 | Disposition: A | Discharge status: Home / Self Care | Payer: Medicare Other | Source: Ambulatory Visit | Attending: Obstetrics and Gynecology | Admitting: Obstetrics and Gynecology

## 2014-04-09 ENCOUNTER — Encounter (HOSPITAL_COMMUNITY): Payer: Self-pay

## 2014-04-09 DIAGNOSIS — N189 Chronic kidney disease, unspecified: Secondary | ICD-10-CM | POA: Diagnosis not present

## 2014-04-09 DIAGNOSIS — Z01812 Encounter for preprocedural laboratory examination: Secondary | ICD-10-CM | POA: Diagnosis not present

## 2014-04-09 DIAGNOSIS — I129 Hypertensive chronic kidney disease with stage 1 through stage 4 chronic kidney disease, or unspecified chronic kidney disease: Secondary | ICD-10-CM | POA: Diagnosis not present

## 2014-04-09 DIAGNOSIS — N2581 Secondary hyperparathyroidism of renal origin: Secondary | ICD-10-CM | POA: Diagnosis not present

## 2014-04-09 HISTORY — DX: Anemia, unspecified: D64.9

## 2014-04-09 LAB — BASIC METABOLIC PANEL
Anion gap: 15 (ref 5–15)
BUN: 30 mg/dL — ABNORMAL HIGH (ref 6–23)
CHLORIDE: 101 meq/L (ref 96–112)
CO2: 26 meq/L (ref 19–32)
Calcium: 9.1 mg/dL (ref 8.4–10.5)
Creatinine, Ser: 1.59 mg/dL — ABNORMAL HIGH (ref 0.50–1.10)
GFR calc Af Amer: 48 mL/min — ABNORMAL LOW (ref >90)
GFR calc non Af Amer: 41 mL/min — ABNORMAL LOW (ref >90)
GLUCOSE: 104 mg/dL — AB (ref 70–99)
POTASSIUM: 4.1 meq/L (ref 3.7–5.3)
SODIUM: 142 meq/L (ref 137–147)

## 2014-04-09 LAB — CBC
HCT: 31.6 % — ABNORMAL LOW (ref 36.0–46.0)
HEMOGLOBIN: 9.9 g/dL — AB (ref 12.0–15.0)
MCH: 25 pg — ABNORMAL LOW (ref 26.0–34.0)
MCHC: 31.3 g/dL (ref 30.0–36.0)
MCV: 79.8 fL (ref 78.0–100.0)
PLATELETS: 319 10*3/uL (ref 150–400)
RBC: 3.96 MIL/uL (ref 3.87–5.11)
RDW: 13.7 % (ref 11.5–15.5)
WBC: 3.6 10*3/uL — AB (ref 4.0–10.5)

## 2014-04-10 ENCOUNTER — Telehealth: Payer: Self-pay | Admitting: Obstetrics and Gynecology

## 2014-04-10 ENCOUNTER — Encounter (HOSPITAL_COMMUNITY): Admission: RE | Payer: Self-pay | Source: Ambulatory Visit

## 2014-04-10 ENCOUNTER — Ambulatory Visit (HOSPITAL_COMMUNITY)
Admission: RE | Admit: 2014-04-10 | Payer: Medicare Other | Source: Ambulatory Visit | Admitting: Obstetrics and Gynecology

## 2014-04-10 SURGERY — DILATATION & CURETTAGE/HYSTEROSCOPY WITH TRUCLEAR
Anesthesia: General

## 2014-04-10 NOTE — Telephone Encounter (Signed)
Pt contacted, to cancel case for today.  [facility] Rep did not have case on his schedule, and case will need to be rescheduled for next week to fit into pt schedule.   I will make arrangements for next week and call pt back.

## 2014-04-11 ENCOUNTER — Other Ambulatory Visit (HOSPITAL_COMMUNITY): Payer: Self-pay | Admitting: *Deleted

## 2014-04-12 ENCOUNTER — Ambulatory Visit (HOSPITAL_COMMUNITY)
Admission: RE | Admit: 2014-04-12 | Discharge: 2014-04-12 | Disposition: A | Discharge status: Home / Self Care | Payer: Medicare Other | Source: Ambulatory Visit | Attending: Nephrology | Admitting: Nephrology

## 2014-04-12 DIAGNOSIS — N189 Chronic kidney disease, unspecified: Secondary | ICD-10-CM | POA: Insufficient documentation

## 2014-04-12 DIAGNOSIS — Z01812 Encounter for preprocedural laboratory examination: Secondary | ICD-10-CM | POA: Insufficient documentation

## 2014-04-12 DIAGNOSIS — I129 Hypertensive chronic kidney disease with stage 1 through stage 4 chronic kidney disease, or unspecified chronic kidney disease: Secondary | ICD-10-CM | POA: Diagnosis not present

## 2014-04-12 DIAGNOSIS — N390 Urinary tract infection, site not specified: Secondary | ICD-10-CM | POA: Diagnosis not present

## 2014-04-12 DIAGNOSIS — N2581 Secondary hyperparathyroidism of renal origin: Secondary | ICD-10-CM | POA: Insufficient documentation

## 2014-04-12 DIAGNOSIS — Z940 Kidney transplant status: Secondary | ICD-10-CM | POA: Diagnosis not present

## 2014-04-12 MED ORDER — SODIUM CHLORIDE 0.9 % IV SOLN
1020.0000 mg | Freq: Once | INTRAVENOUS | Status: AC
  Administered 2014-04-12: 1020 mg via INTRAVENOUS
  Filled 2014-04-12: qty 34

## 2014-04-16 ENCOUNTER — Encounter (HOSPITAL_COMMUNITY): Payer: Medicare Other | Admitting: Certified Registered Nurse Anesthetist

## 2014-04-16 ENCOUNTER — Ambulatory Visit (HOSPITAL_COMMUNITY)
Admission: RE | Admit: 2014-04-16 | Discharge: 2014-04-16 | Disposition: A | Discharge status: Home / Self Care | Payer: Medicare Other | Source: Ambulatory Visit | Attending: Obstetrics and Gynecology | Admitting: Obstetrics and Gynecology

## 2014-04-16 ENCOUNTER — Encounter (HOSPITAL_COMMUNITY)
Admission: RE | Disposition: A | Discharge status: Home / Self Care | Payer: Self-pay | Source: Ambulatory Visit | Attending: Obstetrics and Gynecology

## 2014-04-16 ENCOUNTER — Encounter (HOSPITAL_COMMUNITY): Payer: Self-pay | Admitting: *Deleted

## 2014-04-16 ENCOUNTER — Ambulatory Visit (HOSPITAL_COMMUNITY): Payer: Medicare Other | Admitting: Certified Registered Nurse Anesthetist

## 2014-04-16 DIAGNOSIS — D649 Anemia, unspecified: Secondary | ICD-10-CM | POA: Diagnosis not present

## 2014-04-16 DIAGNOSIS — N920 Excessive and frequent menstruation with regular cycle: Secondary | ICD-10-CM | POA: Diagnosis not present

## 2014-04-16 DIAGNOSIS — D250 Submucous leiomyoma of uterus: Secondary | ICD-10-CM | POA: Diagnosis not present

## 2014-04-16 DIAGNOSIS — N921 Excessive and frequent menstruation with irregular cycle: Secondary | ICD-10-CM | POA: Diagnosis not present

## 2014-04-16 DIAGNOSIS — Z87891 Personal history of nicotine dependence: Secondary | ICD-10-CM | POA: Insufficient documentation

## 2014-04-16 DIAGNOSIS — I129 Hypertensive chronic kidney disease with stage 1 through stage 4 chronic kidney disease, or unspecified chronic kidney disease: Secondary | ICD-10-CM | POA: Diagnosis not present

## 2014-04-16 DIAGNOSIS — N854 Malposition of uterus: Secondary | ICD-10-CM | POA: Insufficient documentation

## 2014-04-16 DIAGNOSIS — N924 Excessive bleeding in the premenopausal period: Secondary | ICD-10-CM

## 2014-04-16 DIAGNOSIS — D259 Leiomyoma of uterus, unspecified: Secondary | ICD-10-CM | POA: Diagnosis not present

## 2014-04-16 DIAGNOSIS — N189 Chronic kidney disease, unspecified: Secondary | ICD-10-CM | POA: Diagnosis not present

## 2014-04-16 DIAGNOSIS — D500 Iron deficiency anemia secondary to blood loss (chronic): Secondary | ICD-10-CM | POA: Diagnosis not present

## 2014-04-16 DIAGNOSIS — K219 Gastro-esophageal reflux disease without esophagitis: Secondary | ICD-10-CM | POA: Insufficient documentation

## 2014-04-16 HISTORY — PX: DILATATION & CURETTAGE/HYSTEROSCOPY WITH TRUECLEAR: SHX6353

## 2014-04-16 LAB — PREGNANCY, URINE: Preg Test, Ur: NEGATIVE

## 2014-04-16 SURGERY — DILATATION & CURETTAGE/HYSTEROSCOPY WITH TRUCLEAR
Anesthesia: General | Site: Vagina

## 2014-04-16 MED ORDER — SODIUM CHLORIDE 0.9 % IR SOLN
Status: DC | PRN
  Administered 2014-04-16: 3000 mL

## 2014-04-16 MED ORDER — LACTATED RINGERS IV SOLN
INTRAVENOUS | Status: DC
  Administered 2014-04-16: 15:00:00 via INTRAVENOUS

## 2014-04-16 MED ORDER — METOCLOPRAMIDE HCL 5 MG/ML IJ SOLN: 10.0000 mg | Freq: Once | INTRAMUSCULAR | Status: DC | PRN

## 2014-04-16 MED ORDER — BUPIVACAINE-EPINEPHRINE (PF) 0.25% -1:200000 IJ SOLN
INTRAMUSCULAR | Status: AC
  Filled 2014-04-16: qty 30

## 2014-04-16 MED ORDER — LACTATED RINGERS IV SOLN
INTRAVENOUS | Status: DC
  Administered 2014-04-16: 13:00:00 via INTRAVENOUS

## 2014-04-16 MED ORDER — FENTANYL CITRATE 0.05 MG/ML IJ SOLN
INTRAMUSCULAR | Status: DC | PRN
Start: 2014-04-16 — End: 2014-04-16
  Administered 2014-04-16: 25 ug via INTRAVENOUS
  Administered 2014-04-16: 50 ug via INTRAVENOUS
  Administered 2014-04-16: 25 ug via INTRAVENOUS

## 2014-04-16 MED ORDER — ONDANSETRON HCL 4 MG/2ML IJ SOLN
INTRAMUSCULAR | Status: DC | PRN
  Administered 2014-04-16: 4 mg via INTRAVENOUS

## 2014-04-16 MED ORDER — LIDOCAINE HCL (CARDIAC) 20 MG/ML IV SOLN
INTRAVENOUS | Status: AC
  Filled 2014-04-16: qty 5

## 2014-04-16 MED ORDER — MIDAZOLAM HCL 2 MG/2ML IJ SOLN
INTRAMUSCULAR | Status: AC
  Filled 2014-04-16: qty 2

## 2014-04-16 MED ORDER — OXYCODONE-ACETAMINOPHEN 5-325 MG PO TABS
1.0000 | ORAL_TABLET | Freq: Once | ORAL | Status: AC
  Administered 2014-04-16: 1 via ORAL

## 2014-04-16 MED ORDER — LIDOCAINE HCL (CARDIAC) 20 MG/ML IV SOLN
INTRAVENOUS | Status: DC | PRN
  Administered 2014-04-16: 60 mg via INTRAVENOUS

## 2014-04-16 MED ORDER — STERILE WATER FOR IRRIGATION IR SOLN
Status: DC | PRN
  Administered 2014-04-16: 1000 mL

## 2014-04-16 MED ORDER — FENTANYL CITRATE 0.05 MG/ML IJ SOLN
INTRAMUSCULAR | Status: AC
  Administered 2014-04-16: 25 ug via INTRAVENOUS
  Filled 2014-04-16: qty 2

## 2014-04-16 MED ORDER — BUPIVACAINE-EPINEPHRINE (PF) 0.25% -1:200000 IJ SOLN
INTRAMUSCULAR | Status: DC | PRN
  Administered 2014-04-16: 18 mL

## 2014-04-16 MED ORDER — OXYCODONE HCL 5 MG PO TABS: 5.0000 mg | ORAL_TABLET | ORAL | Status: DC | PRN

## 2014-04-16 MED ORDER — FENTANYL CITRATE 0.05 MG/ML IJ SOLN
25.0000 ug | INTRAMUSCULAR | Status: DC | PRN
  Administered 2014-04-16: 25 ug via INTRAVENOUS

## 2014-04-16 MED ORDER — PROPOFOL 10 MG/ML IV BOLUS
INTRAVENOUS | Status: DC | PRN
  Administered 2014-04-16: 180 mg via INTRAVENOUS

## 2014-04-16 MED ORDER — LIDOCAINE HCL 1 % IJ SOLN
INTRAMUSCULAR | Status: AC
  Filled 2014-04-16: qty 20

## 2014-04-16 MED ORDER — FENTANYL CITRATE 0.05 MG/ML IJ SOLN
INTRAMUSCULAR | Status: AC
  Filled 2014-04-16: qty 2

## 2014-04-16 MED ORDER — MIDAZOLAM HCL 2 MG/2ML IJ SOLN
INTRAMUSCULAR | Status: DC | PRN
  Administered 2014-04-16: 1 mg via INTRAVENOUS

## 2014-04-16 MED ORDER — ONDANSETRON HCL 4 MG/2ML IJ SOLN
INTRAMUSCULAR | Status: AC
  Filled 2014-04-16: qty 2

## 2014-04-16 MED ORDER — OXYCODONE-ACETAMINOPHEN 5-325 MG PO TABS
ORAL_TABLET | ORAL | Status: AC
  Administered 2014-04-16: 1 via ORAL
  Filled 2014-04-16: qty 1

## 2014-04-16 MED ORDER — DEXTROSE 5 % IV SOLN
INTRAVENOUS | Status: DC | PRN
  Administered 2014-04-16: 60 mL

## 2014-04-16 SURGICAL SUPPLY — 18 items
BLADE INCISOR TRUC PLUS 2.9 (ABLATOR) IMPLANT
CANISTERS HI-FLOW 3000CC (CANNISTER) ×2 IMPLANT
CATH ROBINSON RED A/P 16FR (CATHETERS) ×2 IMPLANT
CLOTH BEACON ORANGE TIMEOUT ST (SAFETY) ×2 IMPLANT
DRAPE HYSTEROSCOPY (DRAPE) ×2 IMPLANT
GLOVE BIOGEL PI IND STRL 9 (GLOVE) ×1 IMPLANT
GLOVE BIOGEL PI INDICATOR 9 (GLOVE) ×1
GLOVE INDICATOR STER SZ 9 (GLOVE) ×4 IMPLANT
GOWN STRL REUS W/TWL 2XL LVL3 (GOWN DISPOSABLE) ×2 IMPLANT
GOWN STRL REUS W/TWL LRG LVL3 (GOWN DISPOSABLE) ×2 IMPLANT
INCISOR TRUC PLUS BLADE 2.9 (ABLATOR)
KIT HYSTEROSCOPY TRUCLEAR (ABLATOR) IMPLANT
MORCELLATOR RECIP TRUCLEAR 4.0 (ABLATOR) IMPLANT
MORCELLATOR ROTARY HYSTERO (ABLATOR) ×1 IMPLANT
PACK VAGINAL MINOR WOMEN LF (CUSTOM PROCEDURE TRAY) ×2 IMPLANT
PAD OB MATERNITY 4.3X12.25 (PERSONAL CARE ITEMS) ×2 IMPLANT
SET TUBING HYSTEROSCOPY 2 NDL (TUBING) ×2 IMPLANT
TUBE HYSTEROSCOPY W Y-CONNECT (TUBING) ×2 IMPLANT

## 2014-04-16 NOTE — Anesthesia Preprocedure Evaluation (Signed)
Anesthesia Evaluation  Patient identified by MRN, date of birth, ID band Patient awake    Reviewed: Allergy & Precautions, H&P , NPO status , Patient's Chart, lab work & pertinent test results  Airway Mallampati: III TM Distance: >3 FB Neck ROM: Full    Dental no notable dental hx. (+) Teeth Intact   Pulmonary former smoker,  breath sounds clear to auscultation  Pulmonary exam normal       Cardiovascular hypertension, Pt. on medications Rhythm:Regular Rate:Normal     Neuro/Psych negative neurological ROS  negative psych ROS   GI/Hepatic Neg liver ROS, GERD-  Medicated and Controlled,  Endo/Other  negative endocrine ROSSecondary hyperparathyroidism  Renal/GU CRF and ARFRenal diseaseS/P renal transplant Steroid dependent  negative genitourinary   Musculoskeletal   Abdominal   Peds  Hematology  (+) anemia ,   Anesthesia Other Findings   Reproductive/Obstetrics Menorrhagia Menometrorrhagia Uterine fibroids                           Anesthesia Physical Anesthesia Plan  ASA: III  Anesthesia Plan: General   Post-op Pain Management:    Induction: Intravenous  Airway Management Planned: LMA  Additional Equipment:   Intra-op Plan:   Post-operative Plan: Extubation in OR  Informed Consent: I have reviewed the patients History and Physical, chart, labs and discussed the procedure including the risks, benefits and alternatives for the proposed anesthesia with the patient or authorized representative who has indicated his/her understanding and acceptance.   Dental advisory given  Plan Discussed with: Anesthesiologist, CRNA and Surgeon  Anesthesia Plan Comments:         Anesthesia Quick Evaluation

## 2014-04-16 NOTE — Op Note (Signed)
04/16/2014  3:51 PM  PATIENT:  Raven Walker  33 y.o. female  PRE-OPERATIVE DIAGNOSIS:  heavy menses, submucous firbroids, anemia  POST-OPERATIVE DIAGNOSIS:  heavy menses, submucous firbroids, anemia  PROCEDURE:  Procedure(s): DILATATION & CURETTAGE/HYSTEROSCOPY WITH TRUCLEAR AND RESECTION OF FIBROID/THERMACHOICE ABLATION (N/A)  SURGEON:  Surgeon(s) and Role:    * Jonnie Kind, MD - Primary  PHYSICIAN ASSISTANT:   ASSISTANTS: none   ANESTHESIA:   local and general  EBL:  Total I/O In: 1500 [I.V.:1500] Out: 55 [Urine:50; Blood:5]  BLOOD ADMINISTERED:none  DRAINS: none   LOCAL MEDICATIONS USED:  Amount: 18 ml  SPECIMEN:  Source of Specimen:  endometrial curettings  DISPOSITION OF SPECIMEN:  PATHOLOGY  COUNTS:  YES  TOURNIQUET:  * No tourniquets in log *  DICTATION: .Dragon Dictation  PLAN OF CARE: Discharge to home after PACU  PATIENT DISPOSITION:  PACU - hemodynamically stable.   Delay start of Pharmacological VTE agent (>24hrs) due to surgical blood loss or risk of bleeding: not applicable Details of procedure: Patient was taken to the operating room prepped and draped for vaginal procedure with timeout conducted and surgical procedure time confirmed by operative team Speculum was inserted, cervix grasped, uterus sounded in the retroverted position to 9 cm, dilated to 25 [place] allowing introduction of the 8 mm truclear obturator device, then the insertion of the camera revealed  the endometrial cavity to be shaggy. A to require endometrial device was then used and endometrial tissue. The myoma was submucous, and did not interfere with the ablation. Therefore was not resected. Curettings were sent as a surgical specimen. Endometrial ablation: Can occur ThermaChoice 3 endometrial ablation device was prepared inserted 9 cm into the endometrial cavity, filled with 19 cc of D5W and the 8 minute thermal ablation sequence completed. Paracervical block with  Marcaine solution x18 cc was injected prior to the procedure. The entire 19 cc were recovered,, and patient went to recovery room in good condition sponge and needle counts correct

## 2014-04-16 NOTE — Anesthesia Postprocedure Evaluation (Signed)
  Anesthesia Post-op Note  Patient: Raven Walker  Procedure(s) Performed: Procedure(s): DILATATION & CURETTAGE/HYSTEROSCOPY WITH TRUCLEAR AND RESECTION OF FIBROID/THERMACHOICE ABLATION (N/A)  Patient Location: PACU  Anesthesia Type:General  Level of Consciousness: awake, alert  and oriented  Airway and Oxygen Therapy: Patient Spontanous Breathing  Post-op Pain: none  Post-op Assessment: Post-op Vital signs reviewed, Patient's Cardiovascular Status Stable, Respiratory Function Stable, Patent Airway, No signs of Nausea or vomiting and Pain level controlled  Post-op Vital Signs: Reviewed and stable  Last Vitals:  Filed Vitals:   04/16/14 1600  BP: 127/82  Pulse: 69  Temp:   Resp: 20    Complications: No apparent anesthesia complications

## 2014-04-16 NOTE — H&P (Signed)
Assessment:   1. Decision regarding hysteroscopy, resection of fibroids and endometrial ablation.  Plan:   1. Will schedule [place] for after patient menstrual period this month  Subjective:   Raven Walker is a 33 y.o. female No obstetric history on file. who presents for annual exam. Patient's last menstrual period was 02/23/2014.  The patient presents today for decision regarding hysteroscopy, resection of fibroids and endometrial ablation.  due for NMP on 03/26/14  The following portions of the patient's history were reviewed and updated as appropriate: allergies, current medications, past family history, past medical history, past social history, past surgical history and problem list.  Review of Systems  No complaints at this time  Objective:   BP 110/74  Ht $R'5\' 9"'ST$  (1.753 m)  Wt 202 lb (91.627 kg)  BMI 29.82 kg/m2  LMP 02/23/2014  BMI: Body mass index is 29.82 kg/(m^2).  General Appearance: Alert, appropriate appearance for age. No acute distress  HEENT: Grossly normal  Neck / Thyroid:  Cardiovascular: RRR; normal S1, S2, no murmur  Lungs: CTA bilaterally  Back: No CVA  Gastrointestinal: Soft, non-tender, no masses or organomegaly  Pelvic Exam: Uterus: retroverted. Minimally enlarged adnexa non tender.  Light white vaginal discharge noted. GC, Chl collected  Lymphatic Exam: Non-palpable nodes in neck, clavicular, axillary, or inguinal regions Skin: no rash or abnormalities  Neurologic: Normal gait and speech, no tremor  Psychiatric: Alert and oriented, appropriate affect.  Imaging Review  Raven Walker is a 33 y.o. 11/29/2013- LMP for a pelvic sonogram for menorrhagia.  Uterus 11.8 x 9.0 x 6.9 cm, retroflexed with multiple fibroids noted (largest fibroid=3.1cm)  Endometrium 12.3 mm, asymmetrical, distorted by fibroids  Right ovary 3.3 x 2.5 x 2.2 cm,  Left ovary 4.0 x 2.8 x 2.3 cm,  No free fluid noted within pelvis  Technician Comments:  Retroflexed uterus with  multiple fibroids noted, Endometrium=12.66mm distorted by fibroids, bilateral adnexa/ovaries appears wnl  Raven Walker  12/07/2013  11:49 AM  Multiple relatively small myomas with 1 or 2 distorting the endometrium, submucosal  Dr Glo Herring to evaluate patient for ablation. I told patient an ablation with Holmium laser of endometrial myomas would be appropriate. Also with significant PMH would have patient fail an ablation before proceeding to more invasive surgery which woul increase her complication percentages  EURE,LUTHER H  12:22 PM  12/07/2013   CBC    Component Value Date/Time   WBC 3.6* 04/09/2014 1100   WBC 4.5 02/01/2009 1456   RBC 3.96 04/09/2014 1100   RBC 3.22* 06/29/2013 0028   RBC 4.22 02/01/2009 1456   HGB 9.9* 04/09/2014 1100   HGB 11.2* 02/01/2009 1456   HCT 31.6* 04/09/2014 1100   HCT 34.2* 02/01/2009 1456   PLT 319 04/09/2014 1100   PLT 221 02/01/2009 1456   MCV 79.8 04/09/2014 1100   MCV 80.9 02/01/2009 1456   MCH 25.0* 04/09/2014 1100   MCH 26.6 02/01/2009 1456   MCHC 31.3 04/09/2014 1100   MCHC 32.8 02/01/2009 1456   RDW 13.7 04/09/2014 1100   RDW 16.9* 02/01/2009 1456   LYMPHSABS 1.0 08/30/2013 0141   LYMPHSABS 1.7 02/01/2009 1456   MONOABS 1.1* 08/30/2013 0141   MONOABS 0.3 02/01/2009 1456   EOSABS 0.0 08/30/2013 0141   EOSABS 0.3 02/01/2009 1456   BASOSABS 0.0 08/30/2013 0141   BASOSABS 0.0 02/01/2009 1456     CMP     Component Value Date/Time   NA 142 04/09/2014 1100   K 4.1 04/09/2014 1100  CL 101 04/09/2014 1100   CO2 26 04/09/2014 1100   GLUCOSE 104* 04/09/2014 1100   BUN 30* 04/09/2014 1100   CREATININE 1.59* 04/09/2014 1100   CALCIUM 9.1 04/09/2014 1100   CALCIUM 5.9* 08/19/2011 0704   PROT 7.9 11/20/2013 1246   ALBUMIN 2.9* 11/22/2013 0519   AST 19 11/20/2013 1246   ALT 13 11/20/2013 1246   ALKPHOS 60 11/20/2013 1246   BILITOT 0.4 11/20/2013 1246   GFRNONAA 41* 04/09/2014 1100   GFRAA 48* 04/09/2014 1100    A:  Menorrhagia Submucous fibroid(s) Chronic renal  disease Anemia Plan: Hysteroscopy, Dilation and curettage endometrial ablation [facility] available for possible resection of fibroid if impinging on endometrial cavity Mallory Shirk. MD  Pgr [phone]  12:44 PM

## 2014-04-16 NOTE — Brief Op Note (Signed)
04/16/2014  3:51 PM  PATIENT:  Patricia Pesa Weatherford  33 y.o. female  PRE-OPERATIVE DIAGNOSIS:  heavy menses, submucous firbroids, anemia  POST-OPERATIVE DIAGNOSIS:  heavy menses, submucous firbroids, anemia  PROCEDURE:  Procedure(s): DILATATION & CURETTAGE/HYSTEROSCOPY WITH TRUCLEAR AND RESECTION OF FIBROID/THERMACHOICE ABLATION (N/A)  SURGEON:  Surgeon(s) and Role:    * Jonnie Kind, MD - Primary  PHYSICIAN ASSISTANT:   ASSISTANTS: none   ANESTHESIA:   local and general  EBL:  Total I/O In: 1500 [I.V.:1500] Out: 55 [Urine:50; Blood:5]  BLOOD ADMINISTERED:none  DRAINS: none   LOCAL MEDICATIONS USED:  Amount: 18 ml  SPECIMEN:  Source of Specimen:  endometrial curettings  DISPOSITION OF SPECIMEN:  PATHOLOGY  COUNTS:  YES  TOURNIQUET:  * No tourniquets in log *  DICTATION: .Dragon Dictation  PLAN OF CARE: Discharge to home after PACU  PATIENT DISPOSITION:  PACU - hemodynamically stable.   Delay start of Pharmacological VTE agent (>24hrs) due to surgical blood loss or risk of bleeding: not applicable

## 2014-04-16 NOTE — Discharge Instructions (Signed)
DISCHARGE INSTRUCTIONS: D&C / D&E The following instructions have been prepared to help you care for yourself upon your return home.   Personal hygiene:  Use sanitary pads for vaginal drainage, not tampons.  Shower the day after your procedure.  NO tub baths, pools or Jacuzzis for 2-3 weeks.  Wipe front to back after using the bathroom.  Activity and limitations:  Do NOT drive or operate any equipment for 24 hours. The effects of anesthesia are still present and drowsiness may result.  Do NOT rest in bed all day.  Walking is encouraged.  Walk up and down stairs slowly.  You may resume your normal activity in one to two days or as indicated by your physician.  Sexual activity: NO intercourse for at least 2 weeks after the procedure, or as indicated by your physician.  Diet: Eat a light meal as desired this evening. You may resume your usual diet tomorrow.  Return to work: You may resume your work activities in one to two days or as indicated by your doctor.  What to expect after your surgery: Expect to have vaginal bleeding/discharge for 2-3 days and spotting for up to 10 days. It is not unusual to have soreness for up to 1-2 weeks. You may have a slight burning sensation when you urinate for the first day. Mild cramps may continue for a couple of days. You may have a regular period in 2-6 weeks.  Call your doctor for any of the following:  Excessive vaginal bleeding, saturating and changing one pad every hour.  Inability to urinate 6 hours after discharge from hospital.  Pain not relieved by pain medication.  Fever of 100.4 F or greater.  Unusual vaginal discharge or odor.   Call for an appointment:    Patients signature: ______________________  Nurses signature ________________________  Support person's signature_______________________    Hysteroscopy Hysteroscopy is a procedure used for looking inside the womb (uterus). It may be done for various reasons,  including:  To evaluate abnormal bleeding, fibroid (benign, noncancerous) tumors, polyps, scar tissue (adhesions), and possibly cancer of the uterus.  To look for lumps (tumors) and other uterine growths.  To look for causes of why a woman cannot get pregnant (infertility), causes of recurrent loss of pregnancy (miscarriages), or a lost intrauterine device (IUD).  To perform a sterilization by blocking the fallopian tubes from inside the uterus. In this procedure, a thin, flexible tube with a tiny light and camera on the end of it (hysteroscope) is used to look inside the uterus. A hysteroscopy should be done right after a menstrual period to be sure you are not pregnant. LET [facility] CARE PROVIDER KNOW ABOUT:   Any allergies you have.  All medicines you are taking, including vitamins, herbs, eye drops, creams, and over-the-counter medicines.  Previous problems you or members of your family have had with the use of anesthetics.  Any blood disorders you have.  Previous surgeries you have had.  Medical conditions you have. RISKS AND COMPLICATIONS  Generally, this is a safe procedure. However, as with any procedure, complications can occur. Possible complications include:  Putting a hole in the uterus.  Excessive bleeding.  Infection.  Damage to the cervix.  Injury to other organs.  Allergic reaction to medicines.  Too much fluid used in the uterus for the procedure. BEFORE THE PROCEDURE   Ask your health care provider about changing or stopping any regular medicines.  Do not take aspirin or blood thinners for 1 week  before the procedure, or as directed by your health care provider. These can cause bleeding.  If you smoke, do not smoke for 2 weeks before the procedure.  In some cases, a medicine is placed in the cervix the day before the procedure. This medicine makes the cervix have a larger opening (dilate). This makes it easier for the instrument to be inserted into  the uterus during the procedure.  Do not eat or drink anything for at least 8 hours before the surgery.  Arrange for someone to take you home after the procedure. PROCEDURE   You may be given a medicine to relax you (sedative). You may also be given one of the following:  A medicine that numbs the area around the cervix (local anesthetic).  A medicine that makes you sleep through the procedure (general anesthetic).  The hysteroscope is inserted through the vagina into the uterus. The camera on the hysteroscope sends a picture to a TV screen. This gives the surgeon a good view inside the uterus.  During the procedure, air or a liquid is put into the uterus, which allows the surgeon to see better.  Sometimes, tissue is gently scraped from inside the uterus. These tissue samples are sent to a lab for testing. AFTER THE PROCEDURE   If you had a general anesthetic, you may be groggy for a couple hours after the procedure.  If you had a local anesthetic, you will be able to go home as soon as you are stable and feel ready.  You may have some cramping. This normally lasts for a couple days.  You may have bleeding, which varies from light spotting for a few days to menstrual-like bleeding for 3-7 days. This is normal.  If your test results are not back during the visit, make an appointment with your health care provider to find out the results. Document Released: 12/28/2000 Document Revised: 07/12/2013 Document Reviewed: 04/20/2013 [facility] Patient Information 2015 [place], [place]. This information is not intended to replace advice given to you by your health care provider. Make sure you discuss any questions you have with your health care provider.

## 2014-04-16 NOTE — Transfer of Care (Signed)
Immediate Anesthesia Transfer of Care Note  Patient: Raven Walker  Procedure(s) Performed: Procedure(s): DILATATION & CURETTAGE/HYSTEROSCOPY WITH TRUCLEAR AND RESECTION OF FIBROID/THERMACHOICE ABLATION (N/A)  Patient Location: PACU  Anesthesia Type:General  Level of Consciousness: awake, alert  and oriented  Airway & Oxygen Therapy: Patient Spontanous Breathing and Patient connected to nasal cannula oxygen  Post-op Assessment: Report given to PACU RN and Post -op Vital signs reviewed and stable  Post vital signs: Reviewed and stable  Complications: No apparent anesthesia complications

## 2014-04-17 ENCOUNTER — Encounter (HOSPITAL_COMMUNITY): Payer: Self-pay | Admitting: Obstetrics and Gynecology

## 2014-04-17 ENCOUNTER — Encounter: Payer: Medicare Other | Admitting: Obstetrics and Gynecology

## 2014-04-17 DIAGNOSIS — D250 Submucous leiomyoma of uterus: Secondary | ICD-10-CM | POA: Diagnosis not present

## 2014-04-24 ENCOUNTER — Telehealth: Payer: Self-pay | Admitting: Obstetrics and Gynecology

## 2014-04-25 NOTE — Telephone Encounter (Signed)
Pt states that she had the D&C last week on the 13th. Pt states that she did not have any bleeding after the surgery. Pt states that she started spotting on Saturday, and is much lighter than her usual period with no clots. Pt states that her last period was the 19th or 20th of last month. Pt denies any major pain at this time. Pt advised that everything sounds normal but I would run it by Dr. Glo Herring to make sure and call her back.  I spoke to JVF and he advised that things sounded normal and as long as she had no fever or heavy bleeding with clots that she should be fine.   I advised the pt of above and she verbalized understanding. The pt was also advised to call the office if any of those symptoms occurred.

## 2014-05-02 ENCOUNTER — Ambulatory Visit (INDEPENDENT_AMBULATORY_CARE_PROVIDER_SITE_OTHER): Payer: Medicare Other | Admitting: Obstetrics and Gynecology

## 2014-05-02 ENCOUNTER — Encounter: Payer: Self-pay | Admitting: Obstetrics and Gynecology

## 2014-05-02 VITALS — BP 120/82 | Ht 69.5 in | Wt 202.0 lb

## 2014-05-02 DIAGNOSIS — Z9889 Other specified postprocedural states: Secondary | ICD-10-CM | POA: Diagnosis not present

## 2014-05-02 MED ORDER — FLUCONAZOLE 150 MG PO TABS: 150.0000 mg | ORAL_TABLET | Freq: Once | ORAL | Status: DC

## 2014-05-02 NOTE — Progress Notes (Signed)
This chart was scribed by Ludger Nutting, Medical Scribe, for Dr. Mallory Shirk on 05/02/14 at 2:11 PM. This chart was reviewed by Dr. Mallory Shirk for accuracy.    Subjective:  Raven Walker is a 33 y.o. female who presents to the clinic 2.5 weeks status post [place] AND RESECTION OF FIBROID/THERMACHOICE ABLATION (N/A)  Review of Systems Negative except vaginal itching after changing soap  She has been eating a regular diet without difficulty.   Bowel movements are normal. The patient is not having any pain.  Objective:  BP 120/82  Ht 5' 9.5" (1.765 m)  Wt 202 lb (91.627 kg)  BMI 29.41 kg/m2  LMP 04/19/2014 General:Well developed, well nourished.  No acute distress. Abdomen: Bowel sounds normal, soft, non-tender. Pelvic Exam:    External Genitalia:  Normal.    Vagina: Normal    Bimanual: Normal    Cervix: Normal    Uterus: Normal    Adnexa: Normal  Incision(s):   Healing well, no drainage, no erythema, no hernia, no swelling, no dehiscence, incision well approximated.   Assessment:  Post-Op 2.5 weeks s/p DILATATION & CURETTAGE/HYSTEROSCOPY WITH TRUCLEAR AND RESECTION OF FIBROID/THERMACHOICE ABLATION (N/A)  Doing well postoperatively.   Plan:  1.Wound care discussed  Vag d/c to last 2 wk 2.Continue any current medications. 3. Activity restrictions: none 4. return to work: now. 5. Follow up as needed

## 2014-05-02 NOTE — Addendum Note (Signed)
Addended by: Jonnie Kind on: 05/02/2014 02:23 PM   Modules accepted: Orders

## 2014-05-04 DIAGNOSIS — Z940 Kidney transplant status: Secondary | ICD-10-CM | POA: Diagnosis not present

## 2014-05-04 DIAGNOSIS — D631 Anemia in chronic kidney disease: Secondary | ICD-10-CM | POA: Diagnosis not present

## 2014-05-04 DIAGNOSIS — N039 Chronic nephritic syndrome with unspecified morphologic changes: Secondary | ICD-10-CM | POA: Diagnosis not present

## 2014-05-04 DIAGNOSIS — Z87440 Personal history of urinary (tract) infections: Secondary | ICD-10-CM | POA: Diagnosis not present

## 2014-05-04 DIAGNOSIS — N189 Chronic kidney disease, unspecified: Secondary | ICD-10-CM | POA: Diagnosis not present

## 2014-05-21 DIAGNOSIS — I129 Hypertensive chronic kidney disease with stage 1 through stage 4 chronic kidney disease, or unspecified chronic kidney disease: Secondary | ICD-10-CM | POA: Diagnosis not present

## 2014-05-21 DIAGNOSIS — Z940 Kidney transplant status: Secondary | ICD-10-CM | POA: Diagnosis not present

## 2014-05-21 DIAGNOSIS — N183 Chronic kidney disease, stage 3 unspecified: Secondary | ICD-10-CM | POA: Diagnosis not present

## 2014-05-21 DIAGNOSIS — D631 Anemia in chronic kidney disease: Secondary | ICD-10-CM | POA: Diagnosis not present

## 2014-05-23 ENCOUNTER — Encounter: Payer: Medicare Other | Attending: Nephrology | Admitting: Dietician

## 2014-05-23 VITALS — Ht 69.0 in | Wt 202.6 lb

## 2014-05-23 DIAGNOSIS — Z713 Dietary counseling and surveillance: Secondary | ICD-10-CM | POA: Diagnosis not present

## 2014-05-23 DIAGNOSIS — N179 Acute kidney failure, unspecified: Secondary | ICD-10-CM | POA: Diagnosis not present

## 2014-05-23 DIAGNOSIS — N189 Chronic kidney disease, unspecified: Secondary | ICD-10-CM

## 2014-05-23 DIAGNOSIS — E663 Overweight: Secondary | ICD-10-CM | POA: Diagnosis not present

## 2014-05-23 DIAGNOSIS — N186 End stage renal disease: Secondary | ICD-10-CM | POA: Diagnosis not present

## 2014-05-23 DIAGNOSIS — I120 Hypertensive chronic kidney disease with stage 5 chronic kidney disease or end stage renal disease: Secondary | ICD-10-CM | POA: Diagnosis not present

## 2014-05-23 DIAGNOSIS — N2581 Secondary hyperparathyroidism of renal origin: Secondary | ICD-10-CM | POA: Diagnosis not present

## 2014-05-23 NOTE — Patient Instructions (Addendum)
Think about "switching up" gym routine and walking. Goal: 5 days week for 45-60 minutes. Try to increase frequency, duration, and/or intensity to get more out of your workouts. Get a copy of your lab work (phosphorus and potassium) next time at [facility] (so we can see if you need make dietary changes).  Don't buy the oatmeal cookies. Have splurges outside of your home. Eat all meals and snacks at the table or not in front of the TV. Add vegetables to lunch and dinners and even snacks. Think about keeping cut up vegetables in your fridge.

## 2014-05-23 NOTE — Progress Notes (Signed)
Medical Nutrition Therapy:  Appt start time: 200 end time:  230.  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 Pt has sig hx for kidney transplant Sept 2013 at [facility]. Had gained weight after her transplant. Current kidney function is good. Previously saw Kevan Mellandick, RD. Goes to [facility] for medical follow up post transplant and has monthly labs taken there.   Is primarily interested in weight loss and lost 3 lbs since the last visit in June (2 months ago). Has tried protein powder than Kevan recommended which she is having in the morning. Started walking most days with a friend. Struggles with sugar craving at night time.   Would like to weigh 165 lbs which she last weighed before her transplant.  Preferred Learning Style:   No preference indicated   Learning Readiness:   Ready  MEDICATIONS: see list.    DIETARY INTAKE: pt reports significant sugar cravings at night. D/C fast food for nearly a year.  Usual eating pattern includes 2-3 meals and 1-2 snacks per day. Everyday foods include sweets, microwave meal.  Avoided foods include pork.    24-hr recall:  B ( AM):protein powder with water Snk ( AM): none  L ( PM): 6 inch [place] sub Snk ( PM): maybe oatmeal cookie or graham crackers or grapes D ( PM): salmon or chicken and green beans with lots of butter and garlic, with a piece of toast with butter, diet dr. Malachi Bonds.  Snk ( PM): rice cakes or something sweet Beverages: water and 3 Diet Dr. Samson Frederic per day  Usual physical activity: walking 2 miles most days with a friend (45-60 minutes)  Progress Towards Goal(s):  In progress.   Nutritional Diagnosis:  [place]-3.3 Overweight/obesity As related to frequent intake of high kcal sweets.  As evidenced by diet recall, BMI>28.    Intervention:  Nutrition counseling.  Plan: Think about "switching up" gym routine and walking. Goal: 5 days week for 45-60 minutes. Try to increase frequency, duration, and/or  intensity to get more out of your workouts. Get a copy of your lab work (phosphorus and potassium) next time at [facility] (so we can see if you need make dietary changes).  Don't buy the oatmeal cookies. Have splurges outside of your home. Eat all meals and snacks at the table or not in front of the TV. Add vegetables to lunch and dinners and even snacks. Think about keeping cut up vegetables in your fridge.   Teaching Method Utilized:  Visual Auditory  Barriers to learning/adherence to lifestyle change: preference for sweet foods, lack of prior knowledge  Demonstrated degree of understanding via:  Teach Back   Monitoring/Evaluation:  Dietary intake, exercise, portion control, and body weight in 2 month(s).

## 2014-06-01 ENCOUNTER — Ambulatory Visit: Payer: Medicare Other | Admitting: Adult Health

## 2014-06-01 DIAGNOSIS — R509 Fever, unspecified: Secondary | ICD-10-CM | POA: Diagnosis not present

## 2014-06-01 DIAGNOSIS — Z9089 Acquired absence of other organs: Secondary | ICD-10-CM | POA: Diagnosis not present

## 2014-06-01 DIAGNOSIS — Z79899 Other long term (current) drug therapy: Secondary | ICD-10-CM | POA: Diagnosis not present

## 2014-06-01 DIAGNOSIS — Z87448 Personal history of other diseases of urinary system: Secondary | ICD-10-CM | POA: Diagnosis not present

## 2014-06-01 DIAGNOSIS — Z87891 Personal history of nicotine dependence: Secondary | ICD-10-CM | POA: Diagnosis not present

## 2014-06-01 DIAGNOSIS — IMO0002 Reserved for concepts with insufficient information to code with codable children: Secondary | ICD-10-CM | POA: Diagnosis not present

## 2014-06-01 DIAGNOSIS — R350 Frequency of micturition: Secondary | ICD-10-CM | POA: Diagnosis not present

## 2014-06-01 DIAGNOSIS — L293 Anogenital pruritus, unspecified: Secondary | ICD-10-CM | POA: Diagnosis not present

## 2014-06-01 DIAGNOSIS — Z9889 Other specified postprocedural states: Secondary | ICD-10-CM | POA: Diagnosis not present

## 2014-06-01 DIAGNOSIS — R3915 Urgency of urination: Secondary | ICD-10-CM | POA: Diagnosis not present

## 2014-06-01 DIAGNOSIS — I10 Essential (primary) hypertension: Secondary | ICD-10-CM | POA: Diagnosis not present

## 2014-06-01 DIAGNOSIS — Z940 Kidney transplant status: Secondary | ICD-10-CM | POA: Diagnosis not present

## 2014-06-01 DIAGNOSIS — N390 Urinary tract infection, site not specified: Secondary | ICD-10-CM | POA: Diagnosis not present

## 2014-06-04 ENCOUNTER — Ambulatory Visit: Payer: Medicare Other | Admitting: Adult Health

## 2014-06-04 ENCOUNTER — Encounter: Payer: Self-pay | Admitting: Adult Health

## 2014-06-04 ENCOUNTER — Ambulatory Visit (INDEPENDENT_AMBULATORY_CARE_PROVIDER_SITE_OTHER): Payer: Medicare Other | Admitting: Adult Health

## 2014-06-04 VITALS — BP 100/60 | Ht 69.5 in | Wt 201.5 lb

## 2014-06-04 DIAGNOSIS — A499 Bacterial infection, unspecified: Secondary | ICD-10-CM

## 2014-06-04 DIAGNOSIS — B9689 Other specified bacterial agents as the cause of diseases classified elsewhere: Secondary | ICD-10-CM

## 2014-06-04 DIAGNOSIS — N760 Acute vaginitis: Secondary | ICD-10-CM

## 2014-06-04 DIAGNOSIS — N898 Other specified noninflammatory disorders of vagina: Secondary | ICD-10-CM

## 2014-06-04 HISTORY — DX: Other specified bacterial agents as the cause of diseases classified elsewhere: B96.89

## 2014-06-04 HISTORY — DX: Other specified noninflammatory disorders of vagina: N89.8

## 2014-06-04 LAB — POCT WET PREP (WET MOUNT): WBC WET PREP: POSITIVE

## 2014-06-04 MED ORDER — METRONIDAZOLE 500 MG PO TABS: 500.0000 mg | ORAL_TABLET | Freq: Two times a day (BID) | ORAL | Status: DC

## 2014-06-04 MED ORDER — FLUCONAZOLE 150 MG PO TABS: ORAL_TABLET | ORAL | Status: DC

## 2014-06-04 NOTE — Progress Notes (Signed)
Subjective:     Patient ID: Raven Walker, female   DOB: August 10, 1981, 33 y.o.   MRN: [id number]  HPI Wandra is a 33 year old black female in complaining of vaginal discharge with itch.Was treated Friday at [facility] ER for UTI.She had endo ablation 04/16/14 but had period 05/26/14.No new partners.Takin cipro and pyridium for UTI.  Review of Systems See HPI Reviewed past medical,surgical, social and family history. Reviewed medications and allergies.     Objective:   Physical Exam BP 100/60  Ht 5' 9.5" (1.765 m)  Wt 201 lb 8 oz (91.4 kg)  BMI 29.34 kg/m2  LMP 05/26/2014 Skin warm and dry.Pelvic: external genitalia is normal in appearance, vagina: tan discharge with odor, cervix:smooth and bulbous, uterus: normal size, shape and contour, non tender, no masses felt, adnexa: no masses or tenderness noted. Wet prep: + for clue cells and +WBCs.    Assessment:     Vaginal discharge BV    Plan:     Rx flagyl 500 mg 1 bid x 7 days, no alcohol, review handout on BV   Rx diflucan 150 mg #2 take 1 now and 1 in 3 days with 1 refill Follow up prn

## 2014-06-04 NOTE — Patient Instructions (Signed)
Bacterial Vaginosis Bacterial vaginosis is a vaginal infection that occurs when the normal balance of bacteria in the vagina is disrupted. It results from an overgrowth of certain bacteria. This is the most common vaginal infection in women of childbearing age. Treatment is important to prevent complications, especially in pregnant women, as it can cause a premature delivery. CAUSES  Bacterial vaginosis is caused by an increase in harmful bacteria that are normally present in smaller amounts in the vagina. Several different kinds of bacteria can cause bacterial vaginosis. However, the reason that the condition develops is not fully understood. RISK FACTORS Certain activities or behaviors can put you at an increased risk of developing bacterial vaginosis, including:  Having a new sex partner or multiple sex partners.  Douching.  Using an intrauterine device (IUD) for contraception. Women do not get bacterial vaginosis from toilet seats, bedding, swimming pools, or contact with objects around them. SIGNS AND SYMPTOMS  Some women with bacterial vaginosis have no signs or symptoms. Common symptoms include:  Grey vaginal discharge.  A fishlike odor with discharge, especially after sexual intercourse.  Itching or burning of the vagina and vulva.  Burning or pain with urination. DIAGNOSIS  Your health care provider will take a medical history and examine the vagina for signs of bacterial vaginosis. A sample of vaginal fluid may be taken. Your health care provider will look at this sample under a microscope to check for bacteria and abnormal cells. A vaginal pH test may also be done.  TREATMENT  Bacterial vaginosis may be treated with antibiotic medicines. These may be given in the form of a pill or a vaginal cream. A second round of antibiotics may be prescribed if the condition comes back after treatment.  HOME CARE INSTRUCTIONS   Only take over-the-counter or prescription medicines as  directed by your health care provider.  If antibiotic medicine was prescribed, take it as directed. Make sure you finish it even if you start to feel better.  Do not have sex until treatment is completed.  Tell all sexual partners that you have a vaginal infection. They should see their health care provider and be treated if they have problems, such as a mild rash or itching.  Practice safe sex by using condoms and only having one sex partner. SEEK MEDICAL CARE IF:   Your symptoms are not improving after 3 days of treatment.  You have increased discharge or pain.  You have a fever. MAKE SURE YOU:   Understand these instructions.  Will watch your condition.  Will get help right away if you are not doing well or get worse. FOR MORE INFORMATION  Centers for Disease Control and Prevention, Division of STD Prevention: AppraiserFraud.fi American Sexual Health Association (ASHA): www.ashastd.org  Document Released: 09/21/2005 Document Revised: 07/12/2013 Document Reviewed: 05/03/2013 [facility] Patient Information 2015 [place], [place]. This information is not intended to replace advice given to you by your health care provider. Make sure you discuss any questions you have with your health care provider. Push fluids Follow up prn

## 2014-07-02 ENCOUNTER — Ambulatory Visit (INDEPENDENT_AMBULATORY_CARE_PROVIDER_SITE_OTHER): Payer: Medicare Other | Admitting: Adult Health

## 2014-07-02 ENCOUNTER — Encounter: Payer: Self-pay | Admitting: Adult Health

## 2014-07-02 VITALS — BP 100/80 | Temp 99.4°F | Ht 69.5 in | Wt 205.0 lb

## 2014-07-02 DIAGNOSIS — Z940 Kidney transplant status: Secondary | ICD-10-CM

## 2014-07-02 DIAGNOSIS — R319 Hematuria, unspecified: Secondary | ICD-10-CM | POA: Insufficient documentation

## 2014-07-02 DIAGNOSIS — R350 Frequency of micturition: Secondary | ICD-10-CM

## 2014-07-02 DIAGNOSIS — N390 Urinary tract infection, site not specified: Secondary | ICD-10-CM

## 2014-07-02 HISTORY — DX: Frequency of micturition: R35.0

## 2014-07-02 HISTORY — DX: Urinary tract infection, site not specified: N39.0

## 2014-07-02 HISTORY — DX: Hematuria, unspecified: R31.9

## 2014-07-02 LAB — POCT URINALYSIS DIPSTICK
GLUCOSE UA: NEGATIVE
Nitrite, UA: POSITIVE

## 2014-07-02 MED ORDER — PHENAZOPYRIDINE HCL 200 MG PO TABS: 200.0000 mg | ORAL_TABLET | Freq: Three times a day (TID) | ORAL | Status: DC | PRN

## 2014-07-02 MED ORDER — CIPROFLOXACIN HCL 500 MG PO TABS: 500.0000 mg | ORAL_TABLET | Freq: Two times a day (BID) | ORAL | Status: DC

## 2014-07-02 NOTE — Patient Instructions (Signed)
Urinary Tract Infection Urinary tract infections (UTIs) can develop anywhere along your urinary tract. Your urinary tract is your body's drainage system for removing wastes and extra water. Your urinary tract includes two kidneys, two ureters, a bladder, and a urethra. Your kidneys are a pair of bean-shaped organs. Each kidney is about the size of your fist. They are located below your ribs, one on each side of your spine. CAUSES Infections are caused by microbes, which are microscopic organisms, including fungi, viruses, and bacteria. These organisms are so small that they can only be seen through a microscope. Bacteria are the microbes that most commonly cause UTIs. SYMPTOMS  Symptoms of UTIs may vary by age and gender of the patient and by the location of the infection. Symptoms in young women typically include a frequent and intense urge to urinate and a painful, burning feeling in the bladder or urethra during urination. Older women and men are more likely to be tired, shaky, and weak and have muscle aches and abdominal pain. A fever may mean the infection is in your kidneys. Other symptoms of a kidney infection include pain in your back or sides below the ribs, nausea, and vomiting. DIAGNOSIS To diagnose a UTI, your caregiver will ask you about your symptoms. Your caregiver also will ask to provide a urine sample. The urine sample will be tested for bacteria and white blood cells. White blood cells are made by your body to help fight infection. TREATMENT  Typically, UTIs can be treated with medication. Because most UTIs are caused by a bacterial infection, they usually can be treated with the use of antibiotics. The choice of antibiotic and length of treatment depend on your symptoms and the type of bacteria causing your infection. HOME CARE INSTRUCTIONS  If you were prescribed antibiotics, take them exactly as your caregiver instructs you. Finish the medication even if you feel better after you  have only taken some of the medication.  Drink enough water and fluids to keep your urine clear or pale yellow.  Avoid caffeine, tea, and carbonated beverages. They tend to irritate your bladder.  Empty your bladder often. Avoid holding urine for long periods of time.  Empty your bladder before and after sexual intercourse.  After a bowel movement, women should cleanse from front to back. Use each tissue only once. SEEK MEDICAL CARE IF:   You have back pain.  You develop a fever.  Your symptoms do not begin to resolve within 3 days. SEEK IMMEDIATE MEDICAL CARE IF:   You have severe back pain or lower abdominal pain.  You develop chills.  You have nausea or vomiting.  You have continued burning or discomfort with urination. MAKE SURE YOU:   Understand these instructions.  Will watch your condition.  Will get help right away if you are not doing well or get worse. Document Released: 07/01/2005 Document Revised: 03/22/2012 Document Reviewed: 10/30/2011 [facility] Patient Information 2015 [place], [place]. This information is not intended to replace advice given to you by your health care provider. Make sure you discuss any questions you have with your health care provider. Take cipro 1 bid x 7 days Push fluids Take pyridium

## 2014-07-02 NOTE — Progress Notes (Signed)
Subjective:     Patient ID: Raven Walker, female   DOB: [birth year], 33 y.o.   MRN: [id number]  HPI Raven Walker is a 33 year old black female in complaining of pressure and urinary frequency since yesterday morning and she had low grade temp and felt queasy last night, she also has neck pain when she gets UTI.Marland KitchenShe is sp renal transplant. She had pyleo in September of 2014.  Review of Systems See HPI Reviewed past medical,surgical, social and family history. Reviewed medications and allergies.     Objective:   Physical Exam BP 100/80  Temp(Src) 99.4 F (37.4 C)  Ht 5' 9.5" (1.765 m)  Wt 205 lb (92.987 kg)  BMI 29.85 kg/m2  LMP 09/19/2015urine dipstick 2+ leuks,2+ protein, 3+ blood and + nitrates, Skin warm and dry, no CVAT and no bladder tenderness.Called and spoke with Dr Jeneen Rinks Deterding, he said get UA C&S as she is having frequent UTI's.    Assessment:    Urinary frequency  Hematuria UTI     Plan:     Rx Cipro 500 mg 1 bid x 7 days #14 no refills Rx pyridium 200 mg #10 1 tid x 3 days Push fluids UA C&S sent Call if temp over 100.6 or feels worse Review handout on UTI

## 2014-07-03 ENCOUNTER — Telehealth: Payer: Self-pay | Admitting: Adult Health

## 2014-07-03 DIAGNOSIS — Z940 Kidney transplant status: Secondary | ICD-10-CM | POA: Diagnosis not present

## 2014-07-03 DIAGNOSIS — D631 Anemia in chronic kidney disease: Secondary | ICD-10-CM | POA: Diagnosis not present

## 2014-07-03 DIAGNOSIS — Z87440 Personal history of urinary (tract) infections: Secondary | ICD-10-CM | POA: Diagnosis not present

## 2014-07-03 DIAGNOSIS — N189 Chronic kidney disease, unspecified: Secondary | ICD-10-CM | POA: Diagnosis not present

## 2014-07-03 LAB — URINALYSIS
BILIRUBIN URINE: NEGATIVE
Glucose, UA: NEGATIVE mg/dL
Ketones, ur: NEGATIVE mg/dL
Nitrite: POSITIVE — AB
Protein, ur: 100 mg/dL — AB
SPECIFIC GRAVITY, URINE: 1.018 (ref 1.005–1.030)
UROBILINOGEN UA: 0.2 mg/dL (ref 0.0–1.0)
pH: 5.5 (ref 5.0–8.0)

## 2014-07-03 NOTE — Telephone Encounter (Signed)
Took cipro and pyridium then vomited ?the pyridium dye...take the cipro then several hour slater take the pyridium and let me know if gets sick again or not

## 2014-07-03 NOTE — Telephone Encounter (Signed)
Spoke with pt. Pt was seen yesterday for UTI. Was given Cipro and Pyridium. Pt started vomiting after taking med. She thinks it may be the dye in the Pyridium. Please advise. Thanks!! [place]

## 2014-07-04 ENCOUNTER — Telehealth: Payer: Self-pay | Admitting: Adult Health

## 2014-07-04 NOTE — Telephone Encounter (Signed)
Pt has nausea and vomiting with pyridium stop it.temp below 100 taking cirpo

## 2014-07-05 ENCOUNTER — Telehealth: Payer: Self-pay | Admitting: Adult Health

## 2014-07-05 LAB — URINE CULTURE: Colony Count: >=100000

## 2014-07-05 NOTE — Telephone Encounter (Signed)
Left message that urine culture + ecoli and was sensitive for cipro

## 2014-07-12 ENCOUNTER — Other Ambulatory Visit (HOSPITAL_COMMUNITY): Payer: Self-pay | Admitting: *Deleted

## 2014-07-13 ENCOUNTER — Encounter (HOSPITAL_COMMUNITY)
Admission: RE | Admit: 2014-07-13 | Discharge: 2014-07-13 | Disposition: A | Discharge status: Home / Self Care | Payer: Medicare Other | Source: Ambulatory Visit | Attending: Nephrology | Admitting: Nephrology

## 2014-07-13 DIAGNOSIS — N390 Urinary tract infection, site not specified: Secondary | ICD-10-CM | POA: Insufficient documentation

## 2014-07-13 DIAGNOSIS — R350 Frequency of micturition: Secondary | ICD-10-CM | POA: Diagnosis present

## 2014-07-13 MED ORDER — SODIUM CHLORIDE 0.9 % IV SOLN
510.0000 mg | Freq: Once | INTRAVENOUS | Status: AC
  Administered 2014-07-13: 510 mg via INTRAVENOUS
  Filled 2014-07-13: qty 17

## 2014-07-19 ENCOUNTER — Other Ambulatory Visit (HOSPITAL_COMMUNITY): Payer: Self-pay | Admitting: *Deleted

## 2014-07-20 ENCOUNTER — Encounter (HOSPITAL_COMMUNITY)
Admission: RE | Admit: 2014-07-20 | Discharge: 2014-07-20 | Disposition: A | Discharge status: Home / Self Care | Payer: Medicare Other | Source: Ambulatory Visit | Attending: Nephrology | Admitting: Nephrology

## 2014-07-20 DIAGNOSIS — N390 Urinary tract infection, site not specified: Secondary | ICD-10-CM | POA: Diagnosis not present

## 2014-07-20 MED ORDER — SODIUM CHLORIDE 0.9 % IV SOLN
510.0000 mg | Freq: Once | INTRAVENOUS | Status: AC
  Administered 2014-07-20: 510 mg via INTRAVENOUS
  Filled 2014-07-20: qty 17

## 2014-07-23 ENCOUNTER — Ambulatory Visit: Payer: Medicare Other | Admitting: Dietician

## 2014-07-25 DIAGNOSIS — Z4822 Encounter for aftercare following kidney transplant: Secondary | ICD-10-CM | POA: Diagnosis not present

## 2014-07-25 DIAGNOSIS — N390 Urinary tract infection, site not specified: Secondary | ICD-10-CM | POA: Diagnosis not present

## 2014-07-25 DIAGNOSIS — Z23 Encounter for immunization: Secondary | ICD-10-CM | POA: Diagnosis not present

## 2014-07-25 DIAGNOSIS — I10 Essential (primary) hypertension: Secondary | ICD-10-CM | POA: Diagnosis not present

## 2014-07-25 DIAGNOSIS — D899 Disorder involving the immune mechanism, unspecified: Secondary | ICD-10-CM | POA: Diagnosis not present

## 2014-07-25 DIAGNOSIS — Z1159 Encounter for screening for other viral diseases: Secondary | ICD-10-CM | POA: Diagnosis not present

## 2014-07-25 DIAGNOSIS — Z940 Kidney transplant status: Secondary | ICD-10-CM | POA: Diagnosis not present

## 2014-07-27 DIAGNOSIS — N189 Chronic kidney disease, unspecified: Secondary | ICD-10-CM | POA: Diagnosis not present

## 2014-07-27 DIAGNOSIS — Z940 Kidney transplant status: Secondary | ICD-10-CM | POA: Diagnosis not present

## 2014-07-27 DIAGNOSIS — Z87440 Personal history of urinary (tract) infections: Secondary | ICD-10-CM | POA: Diagnosis not present

## 2014-08-06 ENCOUNTER — Encounter: Payer: Self-pay | Admitting: Adult Health

## 2014-08-16 ENCOUNTER — Encounter: Payer: Medicare Other | Attending: Nephrology | Admitting: Dietician

## 2014-08-16 VITALS — Ht 69.0 in | Wt 209.3 lb

## 2014-08-16 DIAGNOSIS — E669 Obesity, unspecified: Secondary | ICD-10-CM | POA: Diagnosis not present

## 2014-08-16 DIAGNOSIS — Z940 Kidney transplant status: Secondary | ICD-10-CM | POA: Insufficient documentation

## 2014-08-16 DIAGNOSIS — I120 Hypertensive chronic kidney disease with stage 5 chronic kidney disease or end stage renal disease: Secondary | ICD-10-CM | POA: Insufficient documentation

## 2014-08-16 DIAGNOSIS — N179 Acute kidney failure, unspecified: Secondary | ICD-10-CM | POA: Diagnosis not present

## 2014-08-16 DIAGNOSIS — N189 Chronic kidney disease, unspecified: Secondary | ICD-10-CM

## 2014-08-16 DIAGNOSIS — N186 End stage renal disease: Secondary | ICD-10-CM | POA: Insufficient documentation

## 2014-08-16 NOTE — Progress Notes (Signed)
Medical Nutrition Therapy:  Appt start time: 930 end time:  1000.  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 Pt has sig hx for kidney transplant Sept 2013 at [facility]. Goes to [facility] for medical follow up post transplant and has monthly labs taken there.   Returns with a 7 lbs weight gain since last time. Feeling more hungry at night. Having stress since her grandfather was in the hospital and she is helping to care for him. Has not been to the gym for the past 10 days or so.   Brought in lab work and potassium and phosphorus are normal.  Would like to weigh 165 lbs which she last weighed before her transplant.  Preferred Learning Style:   No preference indicated   Learning Readiness:   Ready  MEDICATIONS: see list.    DIETARY INTAKE: pt reports significant sugar cravings at night.  Usual eating pattern includes 2-3 meals and 1-2 snacks per day. Everyday foods include sweets, microwave meal.  Avoided foods include pork, milk    24-hr recall:  B ( AM):4 eggs with Diet Pepsi Snk ( AM): Hot Pocket or minute rice  L ( PM): 6 inch [place] and cheese with veggies sub or [employer] with water or Diet Pepsi and sometimes a cookie Snk ( PM): not usually  D ( PM): [employer] chicken breast with corn and candied yams  Snk ( PM):cookie crisp with no milk Beverages: water and 3 Diet Dr. Samson Frederic per day  Usual physical activity: walking 2 miles sometimes (45-60 minutes) (hasn't been to the gym recently)  Progress Towards Goal(s):  In progress.   Nutritional Diagnosis:  [place]-3.3 Overweight/obesity As related to frequent intake of high kcal sweets.  As evidenced by diet recall, BMI>28.    Intervention:  Nutrition counseling.  Plan: Plan to get up and go to the gym in the morning. Goal: 5 days week for 60 minutes (cardio and weights). Don't buy the sweet cereals. Eat all meals and snacks at the table or not in front of the TV. Add vegetables to lunch and dinners and even  snacks. Think about keeping cut up vegetables in your fridge.  Think about having just one diet soda a day. Drink mostly water and diet Minute Maid. Plan to cook/prepare meals at home most of the time. Eat at [employer] 3 x week. (Think about trying avocado).  Teaching Method Utilized:  Visual Auditory  Barriers to learning/adherence to lifestyle change: preference for sweet foods, lack of prior knowledge  Demonstrated degree of understanding via:  Teach Back   Monitoring/Evaluation:  Dietary intake, exercise, portion control, and body weight in 2 month(s).

## 2014-08-16 NOTE — Patient Instructions (Addendum)
Plan to get up and go to the gym in the morning. Goal: 5 days week for 60 minutes (cardio and weights). Don't buy the sweet cereals. Eat all meals and snacks at the table or not in front of the TV. Add vegetables to lunch and dinners and even snacks. Think about keeping cut up vegetables in your fridge.  Think about having just one diet soda a day. Drink mostly water and diet Minute Maid. Plan to cook/prepare meals at home most of the time. Eat at [employer] 3 x week. (Think about trying avocado).

## 2014-09-06 DIAGNOSIS — Z940 Kidney transplant status: Secondary | ICD-10-CM | POA: Diagnosis not present

## 2014-09-06 DIAGNOSIS — N2581 Secondary hyperparathyroidism of renal origin: Secondary | ICD-10-CM | POA: Diagnosis not present

## 2014-09-06 DIAGNOSIS — I129 Hypertensive chronic kidney disease with stage 1 through stage 4 chronic kidney disease, or unspecified chronic kidney disease: Secondary | ICD-10-CM | POA: Diagnosis not present

## 2014-09-06 DIAGNOSIS — N189 Chronic kidney disease, unspecified: Secondary | ICD-10-CM | POA: Diagnosis not present

## 2014-09-06 DIAGNOSIS — N183 Chronic kidney disease, stage 3 (moderate): Secondary | ICD-10-CM | POA: Diagnosis not present

## 2014-09-06 DIAGNOSIS — D631 Anemia in chronic kidney disease: Secondary | ICD-10-CM | POA: Diagnosis not present

## 2014-09-06 DIAGNOSIS — E785 Hyperlipidemia, unspecified: Secondary | ICD-10-CM | POA: Diagnosis not present

## 2014-09-19 DIAGNOSIS — Z940 Kidney transplant status: Secondary | ICD-10-CM | POA: Diagnosis not present

## 2014-10-17 ENCOUNTER — Ambulatory Visit: Payer: Medicare Other | Admitting: Adult Health

## 2014-10-23 ENCOUNTER — Encounter: Payer: Self-pay | Admitting: Adult Health

## 2014-10-23 ENCOUNTER — Ambulatory Visit: Payer: Medicare Other | Admitting: Adult Health

## 2014-11-22 DIAGNOSIS — Z87440 Personal history of urinary (tract) infections: Secondary | ICD-10-CM | POA: Diagnosis not present

## 2014-11-22 DIAGNOSIS — Z940 Kidney transplant status: Secondary | ICD-10-CM | POA: Diagnosis not present

## 2014-11-22 DIAGNOSIS — N189 Chronic kidney disease, unspecified: Secondary | ICD-10-CM | POA: Diagnosis not present

## 2014-12-14 DIAGNOSIS — D631 Anemia in chronic kidney disease: Secondary | ICD-10-CM | POA: Diagnosis not present

## 2014-12-14 DIAGNOSIS — N183 Chronic kidney disease, stage 3 (moderate): Secondary | ICD-10-CM | POA: Diagnosis not present

## 2014-12-14 DIAGNOSIS — N189 Chronic kidney disease, unspecified: Secondary | ICD-10-CM | POA: Diagnosis not present

## 2014-12-14 DIAGNOSIS — Z940 Kidney transplant status: Secondary | ICD-10-CM | POA: Diagnosis not present

## 2014-12-14 DIAGNOSIS — I129 Hypertensive chronic kidney disease with stage 1 through stage 4 chronic kidney disease, or unspecified chronic kidney disease: Secondary | ICD-10-CM | POA: Diagnosis not present

## 2014-12-14 DIAGNOSIS — E785 Hyperlipidemia, unspecified: Secondary | ICD-10-CM | POA: Diagnosis not present

## 2014-12-27 ENCOUNTER — Ambulatory Visit (INDEPENDENT_AMBULATORY_CARE_PROVIDER_SITE_OTHER): Payer: Medicare Other | Admitting: Advanced Practice Midwife

## 2014-12-27 ENCOUNTER — Encounter: Payer: Self-pay | Admitting: Advanced Practice Midwife

## 2014-12-27 VITALS — BP 110/60 | Ht 69.5 in | Wt 209.0 lb

## 2014-12-27 DIAGNOSIS — R350 Frequency of micturition: Secondary | ICD-10-CM | POA: Diagnosis not present

## 2014-12-27 LAB — POCT URINALYSIS DIPSTICK
Blood, UA: NEGATIVE
GLUCOSE UA: NEGATIVE
Ketones, UA: NEGATIVE
LEUKOCYTES UA: NEGATIVE
Nitrite, UA: NEGATIVE
PROTEIN UA: NEGATIVE

## 2014-12-27 MED ORDER — URIBEL 118 MG PO CAPS: 1.0000 | ORAL_CAPSULE | Freq: Four times a day (QID) | ORAL | Status: DC

## 2014-12-27 MED ORDER — SULFAMETHOXAZOLE-TRIMETHOPRIM 800-160 MG PO TABS: 1.0000 | ORAL_TABLET | Freq: Two times a day (BID) | ORAL | Status: DC

## 2014-12-27 NOTE — Progress Notes (Signed)
[facility] Visit  Patient name: Raven Walker MRN [id number]  Date of birth: 09-28-1981  CC & HPI:  Raven Walker is a 34 y.o. African American female presenting today for c/o urinary frequency and dysuria for 2 days.  She has had many UTI's (culture + ) in the past, and this "feels exactly like that.".  She vomited pyridium last time she took it, so does not want it.   Pertinent History Reviewed:  Medical & Surgical Hx:   Past Medical History  Diagnosis Date  . Hypertension   . Poor appetite     History  . GERD (gastroesophageal reflux disease) Dec. 2010    ulcerative esophagitis per EGD   . Secondary hyperparathyroidism     s/p parathyroidectomy with autotransplantation to left arm per Dr. Baker Pierini on 08/04/11  . Streptococcal peritonitis  Sept. 2012  . SVD (spontaneous vaginal delivery)     x 1  . ESRD on peritoneal dialysis 2008    History -secondary to HTN and congenital single kidney  . Anemia     history  . Vaginal discharge 06/04/2014  . BV (bacterial vaginosis) 06/04/2014  . Urinary frequency 07/02/2014  . Hematuria 07/02/2014  . UTI (lower urinary tract infection) 07/02/2014   Past Surgical History  Procedure Laterality Date  . Arm graphs  2008    three - done at [facility]  . Parathyroidectomy  08/05/11    with graft of parathyroid into left upper arm.  . Kidney transplant Right 2013    [place] tooth extraction    . Dilatation & curettage/hysteroscopy with trueclear N/A 04/16/2014    Procedure: DILATATION & CURETTAGE/HYSTEROSCOPY WITH TRUCLEAR AND RESECTION OF FIBROID/THERMACHOICE ABLATION;  Surgeon: Jonnie Kind, MD;  Location: [facility];  Service: Gynecology;  Laterality: N/A;   Family History  Problem Relation Age of Onset  . Hypertension Mother   . Hypertension Father   . Obesity Son   . Cancer Maternal Uncle   . Diabetes Maternal Grandmother   . Stroke Maternal Grandfather     Current outpatient  prescriptions:  .  Acetaminophen 500 MG coapsule, Take by mouth., Disp: , Rfl:  .  calcitRIOL (ROCALTROL) 0.5 MCG capsule, Take 0.5 mcg by mouth 2 (two) times daily., Disp: , Rfl:  .  calcium carbonate (TUMS - DOSED IN MG ELEMENTAL CALCIUM) 500 MG chewable tablet, Chew 3 tablets by mouth 3 (three) times daily., Disp: , Rfl:  .  Cholecalciferol 1000 UNITS tablet, Take 1,000 Units by mouth daily., Disp: , Rfl:  .  diltiazem (CARDIZEM) 30 MG tablet, Take 30 mg by mouth 2 (two) times daily., Disp: , Rfl:  .  epoetin alfa (EPOGEN,PROCRIT) 84665 UNIT/ML injection, Inject 30,000 Units into the skin every 21 ( twenty-one) days. Last dose last 04/07/14, Disp: , Rfl:  .  ferrous sulfate 325 (65 FE) MG EC tablet, Take by mouth., Disp: , Rfl:  .  fluconazole (DIFLUCAN) 150 MG tablet, Take 1 now and 1 in 3 days, Disp: 2 tablet, Rfl: 1 .  furosemide (LASIX) 40 MG tablet, Take 40 mg by mouth daily., Disp: , Rfl:  .  Meth-Hyo-M Bl-Na Phos-Ph Sal (URIBEL) 118 MG CAPS, Take 1 capsule (118 mg total) by mouth 4 (four) times daily., Disp: 20 capsule, Rfl: 1 .  mycophenolate (CELLCEPT) 250 MG capsule, Take 750 mg by mouth 2 (two) times daily. , Disp: , Rfl:  .  pantoprazole (PROTONIX) 40 MG tablet, Take  40 mg by mouth 2 (two) times daily. , Disp: , Rfl:  .  phenazopyridine (PYRIDIUM) 200 MG tablet, Take 1 tablet (200 mg total) by mouth 3 (three) times daily as needed for pain., Disp: 10 tablet, Rfl: 0 .  predniSONE (DELTASONE) 5 MG tablet, Take 5 mg by mouth daily., Disp: , Rfl:  .  PROGRAF 1 MG capsule, Take 4-5 mg by mouth 2 (two) times daily. $RemoveBefo'4mg'lyLlCKyAzDH$  in the morning and $RemoveBef'5mg'TNjCsPxBHg$  in the evening., Disp: , Rfl:  .  sulfamethoxazole-trimethoprim (BACTRIM DS,SEPTRA DS) 800-160 MG per tablet, Take 1 tablet by mouth 2 (two) times daily., Disp: 10 tablet, Rfl: 0 Social History: Reviewed -  reports that she quit smoking about 15 years ago. Her smoking use included Cigarettes. She has a .5 pack-year smoking history. She has never used  smokeless tobacco.  Review of Systems:   Constitutional: Negative for fever and chills Eyes: Negative for visual disturbances Respiratory: Negative for shortness of breath, dyspnea Cardiovascular: Negative for chest pain or palpitations  Gastrointestinal: Negative for vomiting, diarrhea and constipation; no abdominal pain Genitourinary: Negative for dysuria and urgency, vaginal irritation or itching Musculoskeletal: Negative for back pain, joint pain, myalgias  Neurological: Negative for dizziness and headaches    Objective Findings:  Vitals: BP 110/60 mmHg  Ht 5' 9.5" (1.765 m)  Wt 209 lb (94.802 kg)  BMI 30.43 kg/m2  Physical Examination: General appearance - alert, well appearing, and in no distress Mental status - alert, oriented to person, place, and time Abdomen - tenderness noted suprapubic area  Results for orders placed or performed in visit on 12/27/14 (from the past 24 hour(s))  POCT urinalysis dipstick   Collection Time: 12/27/14  2:16 PM  Result Value Ref Range   Color, UA     Clarity, UA     Glucose, UA neg    Bilirubin, UA     Ketones, UA neg    Spec Grav, UA     Blood, UA neg    pH, UA     Protein, UA neg    Urobilinogen, UA     Nitrite, UA neg    Leukocytes, UA Negative       Assessment & Plan:  A:   Probable UTI, based on pt's sx P:  Do not want to wait 5 days for a culture because pt has had a kidney transplant and I don't want to risk pyelonephritis.  Will RX Sepetra DS BID X5 and Uribel QID X 2-3 days. Will culture urine    CRESENZO-DISHMAN,Shyne Lehrke CNM 12/27/2014 2:37 PM

## 2015-01-18 ENCOUNTER — Ambulatory Visit: Payer: Medicare Other | Admitting: Obstetrics and Gynecology

## 2015-01-22 ENCOUNTER — Encounter: Payer: Medicare Other | Admitting: Obstetrics and Gynecology

## 2015-01-22 ENCOUNTER — Encounter: Payer: Self-pay | Admitting: Obstetrics and Gynecology

## 2015-01-22 NOTE — Progress Notes (Signed)
Patient ID: Raven Walker, female   DOB: June 06, 1981, 34 y.o.   MRN: [id number] Pt here today to discuss an endometrial ablation.

## 2015-01-23 DIAGNOSIS — Z79899 Other long term (current) drug therapy: Secondary | ICD-10-CM | POA: Diagnosis not present

## 2015-01-23 DIAGNOSIS — Z4822 Encounter for aftercare following kidney transplant: Secondary | ICD-10-CM | POA: Diagnosis not present

## 2015-01-23 DIAGNOSIS — Z23 Encounter for immunization: Secondary | ICD-10-CM | POA: Diagnosis not present

## 2015-01-23 DIAGNOSIS — Z940 Kidney transplant status: Secondary | ICD-10-CM | POA: Diagnosis not present

## 2015-01-23 DIAGNOSIS — D649 Anemia, unspecified: Secondary | ICD-10-CM | POA: Diagnosis not present

## 2015-01-24 ENCOUNTER — Ambulatory Visit: Payer: Medicare Other | Admitting: Obstetrics and Gynecology

## 2015-01-28 ENCOUNTER — Ambulatory Visit (INDEPENDENT_AMBULATORY_CARE_PROVIDER_SITE_OTHER): Payer: Medicare Other | Admitting: Obstetrics and Gynecology

## 2015-02-01 NOTE — Progress Notes (Signed)
This encounter was created in error - please disregard.

## 2015-02-15 ENCOUNTER — Ambulatory Visit (INDEPENDENT_AMBULATORY_CARE_PROVIDER_SITE_OTHER): Payer: Medicare Other | Admitting: Obstetrics and Gynecology

## 2015-02-15 ENCOUNTER — Encounter: Payer: Self-pay | Admitting: Obstetrics and Gynecology

## 2015-02-15 VITALS — BP 120/76 | Ht 69.5 in | Wt 210.0 lb

## 2015-02-15 DIAGNOSIS — N920 Excessive and frequent menstruation with regular cycle: Secondary | ICD-10-CM | POA: Diagnosis not present

## 2015-02-15 MED ORDER — NORETHINDRONE 0.35 MG PO TABS: 1.0000 | ORAL_TABLET | Freq: Every day | ORAL | Status: DC

## 2015-02-15 NOTE — Progress Notes (Signed)
Patient ID: Raven Walker, female   DOB: October 17, 1980, 34 y.o.   MRN: [id number]   [facility] Visit  Patient name: Raven Walker MRN [id number]  Date of birth: Dec 11, 1980  CC & HPI:  Raven Walker is a 34 y.o. female presenting today for discussion of an endometrial ablation as possible management for menorrhagia with clots and subsequent anemia. We did an ablation in 2015 to reduce her menstrual bleeding, which improved her symptoms for a short period of time, but her symptoms soon returned. She states that now, her menses last 6-7 days, and she passes clots during the first 2-3 days. She has been going anemic, which she is able to tell because she feels fatigued, and is forced to inject Epogen. Patient had a kidney transplant in 2013. Her LMP occurred 9 days ago. She is not currently using contraception.    ROS:  A complete review of systems was obtained and all systems are negative except as noted in the HPI and PMH.    Pertinent History Reviewed:   Reviewed: Significant for dilatation & curettage/hysteroscopy with truclear and resection of fibroid/thermachoice ablation.  Medical         Past Medical History  Diagnosis Date  . Hypertension   . Poor appetite     History  . GERD (gastroesophageal reflux disease) Dec. 2010    ulcerative esophagitis per EGD   . Secondary hyperparathyroidism     s/p parathyroidectomy with autotransplantation to left arm per Dr. Baker Pierini on 08/04/11  . Streptococcal peritonitis  Sept. 2012  . SVD (spontaneous vaginal delivery)     x 1  . ESRD on peritoneal dialysis 2008    History -secondary to HTN and congenital single kidney  . Anemia     history  . Vaginal discharge 06/04/2014  . BV (bacterial vaginosis) 06/04/2014  . Urinary frequency 07/02/2014  . Hematuria 07/02/2014  . UTI (lower urinary tract infection) 07/02/2014                              Surgical Hx:    Past Surgical History  Procedure Laterality Date  .  Arm graphs  2008    three - done at [facility]  . Parathyroidectomy  08/05/11    with graft of parathyroid into left upper arm.  . Kidney transplant Right 2013    [place] tooth extraction    . Dilatation & curettage/hysteroscopy with trueclear N/A 04/16/2014    Procedure: DILATATION & CURETTAGE/HYSTEROSCOPY WITH TRUCLEAR AND RESECTION OF FIBROID/THERMACHOICE ABLATION;  Surgeon: Jonnie Kind, MD;  Location: [facility];  Service: Gynecology;  Laterality: N/A;   Medications: Reviewed & Updated - see associated section                       Current outpatient prescriptions:  .  Acetaminophen 500 MG coapsule, Take by mouth., Disp: , Rfl:  .  calcitRIOL (ROCALTROL) 0.5 MCG capsule, Take 0.5 mcg by mouth 2 (two) times daily., Disp: , Rfl:  .  calcium carbonate (TUMS - DOSED IN MG ELEMENTAL CALCIUM) 500 MG chewable tablet, Chew 3 tablets by mouth 3 (three) times daily., Disp: , Rfl:  .  Cholecalciferol 1000 UNITS tablet, Take 1,000 Units by mouth daily., Disp: , Rfl:  .  diltiazem (CARDIZEM) 30 MG tablet, Take 30 mg by mouth 2 (two) times daily., Disp: , Rfl:  .  epoetin alfa (EPOGEN,PROCRIT) 16109 UNIT/ML injection, Inject 30,000 Units into the skin every 21 ( twenty-one) days. Last dose last 04/07/14, Disp: , Rfl:  .  ferrous sulfate 325 (65 FE) MG EC tablet, Take by mouth., Disp: , Rfl:  .  fluconazole (DIFLUCAN) 150 MG tablet, Take 1 now and 1 in 3 days, Disp: 2 tablet, Rfl: 1 .  furosemide (LASIX) 40 MG tablet, Take 40 mg by mouth daily., Disp: , Rfl:  .  Meth-Hyo-M Bl-Na Phos-Ph Sal (URIBEL) 118 MG CAPS, Take 1 capsule (118 mg total) by mouth 4 (four) times daily., Disp: 20 capsule, Rfl: 1 .  mycophenolate (CELLCEPT) 250 MG capsule, Take 750 mg by mouth 2 (two) times daily. , Disp: , Rfl:  .  pantoprazole (PROTONIX) 40 MG tablet, Take 40 mg by mouth 2 (two) times daily. , Disp: , Rfl:  .  phenazopyridine (PYRIDIUM) 200 MG tablet, Take 1 tablet (200 mg total) by mouth 3 (three)  times daily as needed for pain., Disp: 10 tablet, Rfl: 0 .  predniSONE (DELTASONE) 5 MG tablet, Take 5 mg by mouth daily., Disp: , Rfl:  .  PROGRAF 1 MG capsule, Take 4-5 mg by mouth 2 (two) times daily. $RemoveBefo'4mg'RnFzIINclsJ$  in the morning and $RemoveBef'5mg'ZrpcOJwEmq$  in the evening., Disp: , Rfl:  .  sulfamethoxazole-trimethoprim (BACTRIM DS,SEPTRA DS) 800-160 MG per tablet, Take 1 tablet by mouth 2 (two) times daily., Disp: 10 tablet, Rfl: 0   Social History: Reviewed -  reports that she quit smoking about 15 years ago. Her smoking use included Cigarettes. She has a .5 pack-year smoking history. She has never used smokeless tobacco.  Objective Findings:  Vitals: Blood pressure 120/76, height 5' 9.5" (1.765 m), weight 210 lb (95.255 kg), last menstrual period 01/10/2015.  Physical Examination:  Patient here for discussion only.    Assessment & Plan:   A:  1. Menorrhagia, subsequent anemia.  P:  1. Schedule a sonohysterogram next month in order to screen for fibroids sub mucous  2. Suppress with Micronor for now 3. Discuss possibility of IUD.    This chart was SCRIBED for Mallory Shirk, MD by Stephania Fragmin, ED Scribe. This patient was seen in my office, and the patient's care was started at 12:59 PM.  I personally performed the services described in this documentation, which was SCRIBED in my presence. The recorded information has been reviewed and considered accurate. It has been edited as necessary during review. Jonnie Kind, MD   I personally performed the services described in this documentation, which was SCRIBED in my presence. The recorded information has been reviewed and considered accurate. It has been edited as necessary during review. Jonnie Kind, MD

## 2015-02-15 NOTE — Progress Notes (Signed)
Patient ID: Raven Walker, female   DOB: Jan 21, 1981, 34 y.o.   MRN: [id number] Pt here today to discuss another endometrial ablation. Pt states that she had one in September or August of 2015.

## 2015-02-26 ENCOUNTER — Telehealth: Payer: Self-pay | Admitting: Obstetrics and Gynecology

## 2015-02-27 ENCOUNTER — Telehealth: Payer: Self-pay | Admitting: Obstetrics and Gynecology

## 2015-02-27 NOTE — Telephone Encounter (Signed)
Pt states that Dr. Glo Herring put her on a low dose BCP and she wanted to know if that would cause her to gain weight. I advised the pt that she should not have too much wt gain with this pill. I advised the pt that if she has any trouble with the pill to call us back. Pt verbalized understanding.

## 2015-02-28 NOTE — Telephone Encounter (Signed)
Spoke with the pt yesterday afternoon and answered all questions she had.

## 2015-03-13 ENCOUNTER — Other Ambulatory Visit: Payer: Self-pay | Admitting: Obstetrics and Gynecology

## 2015-03-13 DIAGNOSIS — N920 Excessive and frequent menstruation with regular cycle: Secondary | ICD-10-CM

## 2015-03-14 ENCOUNTER — Ambulatory Visit (INDEPENDENT_AMBULATORY_CARE_PROVIDER_SITE_OTHER): Payer: Medicare Other

## 2015-03-14 DIAGNOSIS — N920 Excessive and frequent menstruation with regular cycle: Secondary | ICD-10-CM | POA: Insufficient documentation

## 2015-03-14 DIAGNOSIS — D250 Submucous leiomyoma of uterus: Secondary | ICD-10-CM

## 2015-03-14 MED ORDER — NORGESTIMATE-ETH ESTRADIOL 0.25-35 MG-MCG PO TABS: 1.0000 | ORAL_TABLET | Freq: Every day | ORAL | Status: DC

## 2015-03-14 NOTE — Progress Notes (Signed)
[place] SONOHYSTEROGRAM  Ant fundal fibroid 5.7 x 5.3cm that distorts the endometrial cavity. The endometrium appears to be thin. Unable to expand endometrial cavity w/ 40 cc of saline because of fibroid. Procedure done w/Dr. Glo Herring

## 2015-03-14 NOTE — Progress Notes (Signed)
Patient ID: Raven Walker, female   DOB: 09-22-1981, 34 y.o.   MRN: [id number]   [facility] Visit  Patient name: Raven Walker MRN [id number]  Date of birth: [birth year]  CC & HPI:  Raven Walker is a 34 y.o. female on [place] presenting today for sonohystrogram done today. Patient complains of heavy periods lasting about 6 days. She denies a history of blood clots. She also denies a history of smoking. She states she once tried Depo, which caused hair loss.   ROS:  A complete 10 system review of systems was obtained and all systems are negative except as noted in the HPI and PMH.   Depo leads to hair loss  Pertinent History Reviewed:   Reviewed: Significant for D&C/hysteroscopy, kidney transplant Medical         Past Medical History  Diagnosis Date  . Hypertension   . Poor appetite     History  . GERD (gastroesophageal reflux disease) Dec. 2010    ulcerative esophagitis per EGD   . Secondary hyperparathyroidism     s/p parathyroidectomy with autotransplantation to left arm per Dr. Baker Pierini on 08/04/11  . Streptococcal peritonitis  Sept. 2012  . SVD (spontaneous vaginal delivery)     x 1  . ESRD on peritoneal dialysis 2008    History -secondary to HTN and congenital single kidney  . Anemia     history  . Vaginal discharge 06/04/2014  . BV (bacterial vaginosis) 06/04/2014  . Urinary frequency 07/02/2014  . Hematuria 07/02/2014  . UTI (lower urinary tract infection) 07/02/2014                              Surgical Hx:    Past Surgical History  Procedure Laterality Date  . Arm graphs  2008    three - done at [facility]  . Parathyroidectomy  08/05/11    with graft of parathyroid into left upper arm.  . Kidney transplant Right 2013    [place] tooth extraction    . Dilatation & curettage/hysteroscopy with trueclear N/A 04/16/2014    Procedure: DILATATION & CURETTAGE/HYSTEROSCOPY WITH TRUCLEAR AND RESECTION OF  FIBROID/THERMACHOICE ABLATION;  Surgeon: Jonnie Kind, MD;  Location: [facility];  Service: Gynecology;  Laterality: N/A;   Medications: Reviewed & Updated - see associated section                       Current outpatient prescriptions:  .  Acetaminophen 500 MG coapsule, Take by mouth., Disp: , Rfl:  .  calcitRIOL (ROCALTROL) 0.5 MCG capsule, Take 0.5 mcg by mouth 2 (two) times daily., Disp: , Rfl:  .  calcium carbonate (TUMS - DOSED IN MG ELEMENTAL CALCIUM) 500 MG chewable tablet, Chew 3 tablets by mouth 3 (three) times daily., Disp: , Rfl:  .  Cholecalciferol 1000 UNITS tablet, Take 1,000 Units by mouth daily., Disp: , Rfl:  .  diltiazem (CARDIZEM) 30 MG tablet, Take 30 mg by mouth 2 (two) times daily., Disp: , Rfl:  .  epoetin alfa (EPOGEN,PROCRIT) 02725 UNIT/ML injection, Inject 30,000 Units into the skin every 21 ( twenty-one) days. Last dose last 04/07/14, Disp: , Rfl:  .  ferrous sulfate 325 (65 FE) MG EC tablet, Take by mouth., Disp: , Rfl:  .  fluconazole (DIFLUCAN) 150 MG tablet, Take 1 now and 1 in 3 days, Disp: 2 tablet, Rfl:  1 .  furosemide (LASIX) 40 MG tablet, Take 40 mg by mouth daily., Disp: , Rfl:  .  Meth-Hyo-M Bl-Na Phos-Ph Sal (URIBEL) 118 MG CAPS, Take 1 capsule (118 mg total) by mouth 4 (four) times daily., Disp: 20 capsule, Rfl: 1 .  mycophenolate (CELLCEPT) 250 MG capsule, Take 750 mg by mouth 2 (two) times daily. , Disp: , Rfl:  .  norethindrone (MICRONOR,CAMILA,ERRIN) 0.35 MG tablet, Take 1 tablet (0.35 mg total) by mouth daily., Disp: 1 Package, Rfl: 11 .  pantoprazole (PROTONIX) 40 MG tablet, Take 40 mg by mouth 2 (two) times daily. , Disp: , Rfl:  .  phenazopyridine (PYRIDIUM) 200 MG tablet, Take 1 tablet (200 mg total) by mouth 3 (three) times daily as needed for pain., Disp: 10 tablet, Rfl: 0 .  predniSONE (DELTASONE) 5 MG tablet, Take 5 mg by mouth daily., Disp: , Rfl:  .  PROGRAF 1 MG capsule, Take 4-5 mg by mouth 2 (two) times daily. $RemoveBefo'4mg'OdchDXsMvxh$  in the morning and $RemoveBef'5mg'rfUgGOVZnT$  in  the evening., Disp: , Rfl:  .  sulfamethoxazole-trimethoprim (BACTRIM DS,SEPTRA DS) 800-160 MG per tablet, Take 1 tablet by mouth 2 (two) times daily., Disp: 10 tablet, Rfl: 0   Social History: Reviewed -  reports that she quit smoking about 15 years ago. Her smoking use included Cigarettes. She has a .5 pack-year smoking history. She has never used smokeless tobacco.  Objective Findings:  Vitals: Last menstrual period 01/11/2015.  Physical Examination:  Pt here for discussion and sonohystrogram only.  Sonohystrogram performed which showed retroverted uterus with anterior fundal fibroid distorting endometrial cavity. Unable to expand uterus with 40 cc of saline. Apparent thin endometrium <4 mm.  Assessment & Plan:   A: menorrhagia s/p endometrial ablation    Submucosal fibroid 6 cm    Renal transplant pt 1. I do not think pt is a good candidate for IUD, due to deformity of endometrial stripe.  2.  P:  1. Trial of Rx Sprintec qd, continuous method. D/c micronor. 2. Discussed surgical options. Will delay consideration of surgery to see if pt responds to continuous OCP therapy.   This chart was SCRIBED for Mallory Shirk, MD by Stephania Fragmin, ED Scribe. This patient was seen in my office, and the patient's care was started at 3:06 PM.  I personally performed the services described in this documentation, which was SCRIBED in my presence. The recorded information has been reviewed and considered accurate. It has been edited as necessary during review. Jonnie Kind, MD

## 2015-03-18 DIAGNOSIS — Z940 Kidney transplant status: Secondary | ICD-10-CM | POA: Diagnosis not present

## 2015-03-18 DIAGNOSIS — I129 Hypertensive chronic kidney disease with stage 1 through stage 4 chronic kidney disease, or unspecified chronic kidney disease: Secondary | ICD-10-CM | POA: Diagnosis not present

## 2015-03-18 DIAGNOSIS — N183 Chronic kidney disease, stage 3 (moderate): Secondary | ICD-10-CM | POA: Diagnosis not present

## 2015-03-18 DIAGNOSIS — D631 Anemia in chronic kidney disease: Secondary | ICD-10-CM | POA: Diagnosis not present

## 2015-03-19 DIAGNOSIS — Z940 Kidney transplant status: Secondary | ICD-10-CM | POA: Diagnosis not present

## 2015-03-19 DIAGNOSIS — N183 Chronic kidney disease, stage 3 (moderate): Secondary | ICD-10-CM | POA: Diagnosis not present

## 2015-03-19 DIAGNOSIS — E782 Mixed hyperlipidemia: Secondary | ICD-10-CM | POA: Diagnosis not present

## 2015-03-19 DIAGNOSIS — N189 Chronic kidney disease, unspecified: Secondary | ICD-10-CM | POA: Diagnosis not present

## 2015-03-19 DIAGNOSIS — N2581 Secondary hyperparathyroidism of renal origin: Secondary | ICD-10-CM | POA: Diagnosis not present

## 2015-03-26 DIAGNOSIS — S0500XA Injury of conjunctiva and corneal abrasion without foreign body, unspecified eye, initial encounter: Secondary | ICD-10-CM | POA: Diagnosis not present

## 2015-03-26 DIAGNOSIS — N390 Urinary tract infection, site not specified: Secondary | ICD-10-CM | POA: Diagnosis not present

## 2015-04-01 ENCOUNTER — Telehealth: Payer: Self-pay | Admitting: Obstetrics and Gynecology

## 2015-04-01 NOTE — Telephone Encounter (Signed)
Pt called asking about taking the "white pills" in her pack. According to Dr.Feguson's Rx she is supposed to only take the active pills. I advised the pt of this and she verbalized understanding.

## 2015-04-10 DIAGNOSIS — I10 Essential (primary) hypertension: Secondary | ICD-10-CM | POA: Diagnosis not present

## 2015-04-17 DIAGNOSIS — D649 Anemia, unspecified: Secondary | ICD-10-CM | POA: Diagnosis not present

## 2015-04-17 DIAGNOSIS — N183 Chronic kidney disease, stage 3 (moderate): Secondary | ICD-10-CM | POA: Diagnosis not present

## 2015-04-22 ENCOUNTER — Telehealth: Payer: Self-pay | Admitting: Obstetrics and Gynecology

## 2015-04-22 NOTE — Telephone Encounter (Signed)
Pt states that her period has been on for about 2 weeks. Pt states that she is taking Sprintec, and has been on for a month. Pt states that she started 04/10/15 and is still bleeding. Pt states that her period is lighter but still a heavy flow. Pt states that her Hgb was 10 when she went to the Dr. Last week. Pt states that her PCP told her that she could have a "laser hysterectomy", he wouldn't recommend her having an abdominal hysterectomy due to her kidney being in the front now. Pt wants to discuss and find out what her options really are with this.   I advised the pt that Dr.Ferguson would be in this afternoon and I would talk to him then and call her back. Pt verbalized understanding.

## 2015-04-23 NOTE — Telephone Encounter (Signed)
Pt called back and was advised, per Dr. Glo Herring, to make an appointment to discuss all of her options. Pt was also advised to continue taking her BCP until she can get in to see Dr. Glo Herring. Pt verbalized understanding and phone call was transferred to front office for appointment.

## 2015-04-24 ENCOUNTER — Encounter: Payer: Self-pay | Admitting: Obstetrics and Gynecology

## 2015-04-24 ENCOUNTER — Ambulatory Visit (INDEPENDENT_AMBULATORY_CARE_PROVIDER_SITE_OTHER): Payer: Medicare Other | Admitting: Obstetrics and Gynecology

## 2015-04-24 VITALS — BP 118/70 | Ht 69.5 in | Wt 206.5 lb

## 2015-04-24 DIAGNOSIS — D500 Iron deficiency anemia secondary to blood loss (chronic): Secondary | ICD-10-CM

## 2015-04-24 DIAGNOSIS — N920 Excessive and frequent menstruation with regular cycle: Secondary | ICD-10-CM

## 2015-04-24 MED ORDER — ESTRADIOL 1 MG PO TABS: 1.0000 mg | ORAL_TABLET | Freq: Two times a day (BID) | ORAL | Status: DC

## 2015-04-24 NOTE — Progress Notes (Signed)
Patient ID: Raven Walker, female   DOB: Nov 08, 1980, 34 y.o.   MRN: [id number] Pt here today for consult. Pt wants to discuss her periods and bleeding and options she may have.

## 2015-04-24 NOTE — Progress Notes (Signed)
Patient ID: Raven Walker, female   DOB: 03-20-1981, 34 y.o.   MRN: [id number]   [facility] Visit  Patient name: Raven Walker MRN [id number]  Date of birth: [birth year]  CC & HPI:  Raven Walker is a 34 y.o. female s/p endometrial ablation in 2015 and kidney transplant and with a history of ESRD on peritoneal dialysis, presenting today to discuss ongoing, excessive, atraumatic, acute menstrual bleeding onset 04/10/15. Pt reports taking Sprintec for the past month. Pt notes her HgB levels were checked by her PCP last week and was recorded at 10. Pt further states that her PCP advised her to have a "laser hysterectomy," and because she had a kidney transplant she cannot endure any type of procedure that entails cutting. Pt wants to discuss her hysterectomy options.   ROS:  A complete 10 system review of systems was obtained and all systems are negative except as noted in the HPI and PMH.    Pertinent History Reviewed:   Reviewed: Significant for endometrial ablation in 2015, right kidney transplant (2013, [facility] at [facility]).   Medical         Past Medical History  Diagnosis Date  . Hypertension   . Poor appetite     History  . GERD (gastroesophageal reflux disease) Dec. 2010    ulcerative esophagitis per EGD   . Secondary hyperparathyroidism     s/p parathyroidectomy with autotransplantation to left arm per Dr. Baker Pierini on 08/04/11  . Streptococcal peritonitis  Sept. 2012  . SVD (spontaneous vaginal delivery)     x 1  . ESRD on peritoneal dialysis 2008    History -secondary to HTN and congenital single kidney  . Anemia     history  . Vaginal discharge 06/04/2014  . BV (bacterial vaginosis) 06/04/2014  . Urinary frequency 07/02/2014  . Hematuria 07/02/2014  . UTI (lower urinary tract infection) 07/02/2014                              Surgical Hx:    Past Surgical History  Procedure Laterality Date  . Arm graphs  2008    three - done at [facility]   . Parathyroidectomy  08/05/11    with graft of parathyroid into left upper arm.  . Kidney transplant Right 2013    [place] tooth extraction    . Dilatation & curettage/hysteroscopy with trueclear N/A 04/16/2014    Procedure: DILATATION & CURETTAGE/HYSTEROSCOPY WITH TRUCLEAR AND RESECTION OF FIBROID/THERMACHOICE ABLATION;  Surgeon: Jonnie Kind, MD;  Location: [facility];  Service: Gynecology;  Laterality: N/A;   Medications: Reviewed & Updated - see associated section                       Current outpatient prescriptions:  .  Acetaminophen 500 MG coapsule, Take by mouth., Disp: , Rfl:  .  calcitRIOL (ROCALTROL) 0.5 MCG capsule, Take 0.5 mcg by mouth 2 (two) times daily., Disp: , Rfl:  .  calcium carbonate (TUMS - DOSED IN MG ELEMENTAL CALCIUM) 500 MG chewable tablet, Chew 3 tablets by mouth 3 (three) times daily., Disp: , Rfl:  .  Cholecalciferol 1000 UNITS tablet, Take 1,000 Units by mouth daily., Disp: , Rfl:  .  diltiazem (CARDIZEM) 30 MG tablet, Take 30 mg by mouth 2 (two) times daily., Disp: , Rfl:  .  epoetin alfa (EPOGEN,PROCRIT) 72536 UNIT/ML injection,  Inject 30,000 Units into the skin every 21 ( twenty-one) days. Last dose last 04/07/14, Disp: , Rfl:  .  ferrous sulfate 325 (65 FE) MG EC tablet, Take by mouth., Disp: , Rfl:  .  fluconazole (DIFLUCAN) 150 MG tablet, Take 1 now and 1 in 3 days, Disp: 2 tablet, Rfl: 1 .  furosemide (LASIX) 40 MG tablet, Take 40 mg by mouth daily., Disp: , Rfl:  .  Meth-Hyo-M Bl-Na Phos-Ph Sal (URIBEL) 118 MG CAPS, Take 1 capsule (118 mg total) by mouth 4 (four) times daily., Disp: 20 capsule, Rfl: 1 .  mycophenolate (CELLCEPT) 250 MG capsule, Take 750 mg by mouth 2 (two) times daily. , Disp: , Rfl:  .  norethindrone (MICRONOR,CAMILA,ERRIN) 0.35 MG tablet, Take 1 tablet (0.35 mg total) by mouth daily., Disp: 1 Package, Rfl: 11 .  norgestimate-ethinyl estradiol (ORTHO-CYCLEN,SPRINTEC,PREVIFEM) 0.25-35 MG-MCG tablet, Take 1 tablet by mouth  daily. Take only the active pills, continuous daily method, Disp: 1 Package, Rfl: 11 .  pantoprazole (PROTONIX) 40 MG tablet, Take 40 mg by mouth 2 (two) times daily. , Disp: , Rfl:  .  phenazopyridine (PYRIDIUM) 200 MG tablet, Take 1 tablet (200 mg total) by mouth 3 (three) times daily as needed for pain., Disp: 10 tablet, Rfl: 0 .  predniSONE (DELTASONE) 5 MG tablet, Take 5 mg by mouth daily., Disp: , Rfl:  .  PROGRAF 1 MG capsule, Take 4-5 mg by mouth 2 (two) times daily. $RemoveBefo'4mg'OyGNNmSZsHS$  in the morning and $RemoveBef'5mg'yiVuyordkw$  in the evening., Disp: , Rfl:  .  sulfamethoxazole-trimethoprim (BACTRIM DS,SEPTRA DS) 800-160 MG per tablet, Take 1 tablet by mouth 2 (two) times daily., Disp: 10 tablet, Rfl: 0   Social History: Reviewed -  reports that she quit smoking about 15 years ago. Her smoking use included Cigarettes. She has a .5 pack-year smoking history. She has never used smokeless tobacco.  Objective Findings:  Vitals: Blood pressure 118/70, height 5' 9.5" (1.765 m), weight 206 lb 8 oz (93.668 kg), last menstrual period 04/09/2015.  Physical Examination:  Patient here for discussion only.   Assessment & Plan:   A: recurrrent aub, due to submucosal fibroid deformity of endometrium 1. 5.7 x 5.3 ant fundal fibroid 2. laprascopic hysterectomy to be planned,  P:  1. Repeat endometrial ablation, D&C, hysteroscopy, Raven Walker to consult with other provider(s) regarding preop lupron, or surgical excison of surface of myoma. 2. Phone call to pt 1 wk 3. Add Estradiol 1 mg bid when spotting.   jvf to work with Raven Walker to schedule hysteroscopy with endo ablation by no[facility]Sure  This chart was SCRIBED for Mallory Shirk, MD by Stephania Fragmin, ED Scribe. This patient was seen in my office,  and the patient's care was started at 1:46 PM.  I personally performed the services described in this documentation, which was SCRIBED in my presence. The recorded information has been reviewed and considered accurate. It has been edited as  necessary during review. Jonnie Kind, MD

## 2015-05-03 ENCOUNTER — Telehealth: Payer: Self-pay | Admitting: Obstetrics and Gynecology

## 2015-05-03 NOTE — Telephone Encounter (Signed)
Pt stated that Dr.Ferguson told her to call back if she had not heard anything about her surgery yet!! I advised the pt that Dr. Glo Herring is out of the office until Monday but I would send him this message and I would remind him Monday and call her back with an update. Pt verbalized understanding.

## 2015-05-07 NOTE — Telephone Encounter (Signed)
Pt contacted by phone: after review of all the u/s pics again, I think it is worthwhile to reattempt endometrial ablation before considering other surgeries. I would like to get her off ocp's after this cycle. Pt aware and desires to proceed to repeat hysteoscopy./ ablation.

## 2015-05-10 ENCOUNTER — Ambulatory Visit: Payer: Medicare Other | Admitting: Obstetrics and Gynecology

## 2015-05-14 ENCOUNTER — Telehealth: Payer: Self-pay | Admitting: Obstetrics and Gynecology

## 2015-05-27 ENCOUNTER — Encounter: Payer: Self-pay | Admitting: Obstetrics and Gynecology

## 2015-05-27 ENCOUNTER — Ambulatory Visit (INDEPENDENT_AMBULATORY_CARE_PROVIDER_SITE_OTHER): Payer: Medicare Other | Admitting: Obstetrics and Gynecology

## 2015-05-27 VITALS — BP 112/72 | Ht 69.5 in | Wt 204.0 lb

## 2015-05-27 DIAGNOSIS — N921 Excessive and frequent menstruation with irregular cycle: Secondary | ICD-10-CM | POA: Diagnosis not present

## 2015-05-27 NOTE — Progress Notes (Signed)
Patient ID: Raven Walker, female   DOB: 1981/03/06, 34 y.o.   MRN: [id number] Pt here today for pre op visit.

## 2015-05-27 NOTE — Progress Notes (Signed)
Patient ID: Raven Walker, female   DOB: 08-11-1981, 34 y.o.   MRN: [id number]  Preoperative History and Physical  Raven Walker is a 34 y.o. G1P1 here for surgical management of menorrhagia.   No significant preoperative concerns. She had a prolonged menses that lasted 3 wk, light, with resolution promptly with Megace use. SHG reviewed in its entirety and shows an anteflexed uterus with shg catheter smoothly passing to the apex of the uterus, with minimal endometrial tissue present. Benign tissue on biopsy done prior to last yr's ablation attempt.    Repeat ablation is recommended due to her status as arenal transplant pt, with advice from renal surgeons to avoid hysterectomy if possible due to close proximity to transplant. If future hyst is required, would refer to tertiary center. Proposed surgery: Repeat endometrial ablation using Novasure.; last performed 04/16/2014 with ThermaChoice.   Past Medical History  Diagnosis Date  . Hypertension   . Poor appetite     History  . GERD (gastroesophageal reflux disease) Dec. 2010    ulcerative esophagitis per EGD   . Secondary hyperparathyroidism     s/p parathyroidectomy with autotransplantation to left arm per Dr. Baker Pierini on 08/04/11  . Streptococcal peritonitis  Sept. 2012  . SVD (spontaneous vaginal delivery)     x 1  . ESRD on peritoneal dialysis 2008    History -secondary to HTN and congenital single kidney  . Anemia     history  . Vaginal discharge 06/04/2014  . BV (bacterial vaginosis) 06/04/2014  . Urinary frequency 07/02/2014  . Hematuria 07/02/2014  . UTI (lower urinary tract infection) 07/02/2014   Past Surgical History  Procedure Laterality Date  . Arm graphs  2008    three - done at [facility]  . Parathyroidectomy  08/05/11    with graft of parathyroid into left upper arm.  . Kidney transplant Right 2013    [place] tooth extraction    . Dilatation & curettage/hysteroscopy with trueclear  N/A 04/16/2014    Procedure: DILATATION & CURETTAGE/HYSTEROSCOPY WITH TRUCLEAR AND RESECTION OF FIBROID/THERMACHOICE ABLATION;  Surgeon: Jonnie Kind, MD;  Location: [facility];  Service: Gynecology;  Laterality: N/A;   OB History  Gravida Para Term Preterm AB SAB TAB Ectopic Multiple Living  $Remov'1 1        1    'QgoWCv$ # Outcome Date GA Lbr Len/2nd Weight Sex Delivery Anes PTL Lv  1 Para             Patient denies any other pertinent gynecologic issues.   Current Outpatient Prescriptions on File Prior to Visit  Medication Sig Dispense Refill  . Acetaminophen 500 MG coapsule Take by mouth.    . calcitRIOL (ROCALTROL) 0.5 MCG capsule Take 0.5 mcg by mouth 2 (two) times daily.    . calcium carbonate (TUMS - DOSED IN MG ELEMENTAL CALCIUM) 500 MG chewable tablet Chew 3 tablets by mouth 3 (three) times daily.    . Cholecalciferol 1000 UNITS tablet Take 1,000 Units by mouth daily.    Marland Kitchen diltiazem (CARDIZEM) 30 MG tablet Take 30 mg by mouth 2 (two) times daily.    Marland Kitchen epoetin alfa (EPOGEN,PROCRIT) 81829 UNIT/ML injection Inject 30,000 Units into the skin every 21 ( twenty-one) days. Last dose last 04/07/14    . estradiol (ESTRACE) 1 MG tablet Take 1 tablet (1 mg total) by mouth 2 (two) times daily. Take on any days of spotting 30 tablet 2  . ferrous sulfate  325 (65 FE) MG EC tablet Take by mouth.    . fluconazole (DIFLUCAN) 150 MG tablet Take 1 now and 1 in 3 days 2 tablet 1  . furosemide (LASIX) 40 MG tablet Take 40 mg by mouth daily.    . Meth-Hyo-M Bl-Na Phos-Ph Sal (URIBEL) 118 MG CAPS Take 1 capsule (118 mg total) by mouth 4 (four) times daily. 20 capsule 1  . mycophenolate (CELLCEPT) 250 MG capsule Take 750 mg by mouth 2 (two) times daily.     . norethindrone (MICRONOR,CAMILA,ERRIN) 0.35 MG tablet Take 1 tablet (0.35 mg total) by mouth daily. 1 Package 11  . norgestimate-ethinyl estradiol (ORTHO-CYCLEN,SPRINTEC,PREVIFEM) 0.25-35 MG-MCG tablet Take 1 tablet by mouth daily. Take only the active pills,  continuous daily method 1 Package 11  . pantoprazole (PROTONIX) 40 MG tablet Take 40 mg by mouth 2 (two) times daily.     . phenazopyridine (PYRIDIUM) 200 MG tablet Take 1 tablet (200 mg total) by mouth 3 (three) times daily as needed for pain. 10 tablet 0  . predniSONE (DELTASONE) 5 MG tablet Take 5 mg by mouth daily.    Marland Kitchen PROGRAF 1 MG capsule Take 4-5 mg by mouth 2 (two) times daily. $RemoveBefo'4mg'yKbepiGEmmk$  in the morning and $RemoveBef'5mg'jpTBbvLTcS$  in the evening.    . sulfamethoxazole-trimethoprim (BACTRIM DS,SEPTRA DS) 800-160 MG per tablet Take 1 tablet by mouth 2 (two) times daily. 10 tablet 0   No current facility-administered medications on file prior to visit.   Allergies  Allergen Reactions  . Nickel Itching and Rash    Only where touched    Social History:   reports that she quit smoking about 15 years ago. Her smoking use included Cigarettes. She has a .5 pack-year smoking history. She has never used smokeless tobacco. She reports that she drinks alcohol. She reports that she does not use illicit drugs.  Family History  Problem Relation Age of Onset  . Hypertension Mother   . Hypertension Father   . Obesity Son   . Cancer Maternal Uncle   . Diabetes Maternal Grandmother   . Stroke Maternal Grandfather     Review of Systems: Noncontributory  PHYSICAL EXAM: Blood pressure 112/72, height 5' 9.5" (1.765 m), weight 204 lb (92.534 kg), last menstrual period 04/05/2015. General appearance - alert, well appearing, and in no distress Chest - clear to auscultation, no wheezes, rales or rhonchi, symmetric air entry Heart - normal rate and regular rhythm Abdomen - soft, nontender, nondistended, no masses or organomegaly Pelvic - examination not indicated Uterus- retroverted uterus, normal support, 8-10 weeks size; firm, posterior wall of uterus Extremities - peripheral pulses normal, no pedal edema, no clubbing or cyanosis  Labs: No results found for this or any previous visit (from the past 336  hour(s)).  Imaging Studies: No results found.  Assessment: Patient Active Problem List   Diagnosis Date Noted  . Menorrhagia with regular cycle 03/14/2015  . Urinary frequency 07/02/2014  . Hematuria 07/02/2014  . UTI (lower urinary tract infection) 07/02/2014  . Vaginal discharge 06/04/2014  . BV (bacterial vaginosis) 06/04/2014  . Post-operative state 05/02/2014  . Submucous uterine fibroid with menorrhagia 02/05/2014  . Sepsis 11/20/2013  . UTI (urinary tract infection) 11/20/2013  . Myalgia 11/20/2013  . Acute on chronic renal failure 11/20/2013  . Leukocytosis, unspecified 11/20/2013  . Menorrhagia, premenopausal 11/15/2013  . Fever 08/30/2013  . Acute pyelonephritis 06/29/2013  . History of renal transplant 06/29/2013  . Hypocalcemia syndrome 08/19/2011    Class: Acute  .  Renal failure (ARF), acute on chronic 08/19/2011    Class: Acute  . HTN (hypertension) 08/19/2011    Class: Acute  . GERD (gastroesophageal reflux disease) 08/19/2011    Class: Chronic  . Hyperparathyroidism, secondary 05/19/2011    Plan: Patient will undergo surgical management with endometrial ablation, novasure on 04 June 2015  Jonnie Kind  05/27/2015 2:27 PM     This chart was SCRIBED for Mallory Shirk, MD by Stephania Fragmin, ED Scribe. This patient was seen in room 1, and the patient's care was started at 2:27 PM.  I personally performed the services described in this documentation, which was SCRIBED in my presence. The recorded information has been reviewed and considered accurate. It has been edited as necessary during review. Jonnie Kind, MD

## 2015-05-29 NOTE — Patient Instructions (Signed)
Raven Walker  05/29/2015     '@PREFPERIOPPHARMACY'$ @   Your procedure is scheduled on  06/04/2015  Report to [facility] at  810  A.M.  Call this number if you have problems the morning of surgery:  [phone]   Remember:  Do not eat food or drink liquids after midnight.  Take these medicines the morning of surgery with A SIP OF WATER : cardiazem, protonix, delatazone   Do not wear jewelry, make-up or nail polish.  Do not wear lotions, powders, or perfumes.    Do not shave 48 hours prior to surgery.  Men may shave face and neck.  Do not bring valuables to the hospital.  [facility] is not responsible for any belongings or valuables.  Contacts, dentures or bridgework may not be worn into surgery.  Leave your suitcase in the car.  After surgery it may be brought to your room.  For patients admitted to the hospital, discharge time will be determined by your treatment team.  Patients discharged the day of surgery will not be allowed to drive home.   Name and phone number of your driver:   family Special instructions:  none  Please read over the following fact sheets that you were given. Pain Booklet, Coughing and Deep Breathing, Surgical Site Infection Prevention, Anesthesia Post-op Instructions and Care and Recovery After Surgery      Endometrial Ablation Endometrial ablation removes the lining of the uterus (endometrium). It is usually a same-day, outpatient treatment. Ablation helps avoid major surgery, such as surgery to remove the cervix and uterus (hysterectomy). After endometrial ablation, you will have little or no menstrual bleeding and may not be able to have children. However, if you are premenopausal, you will need to use a reliable method of birth control following the procedure because of the small chance that pregnancy can occur. There are different reasons to have this procedure, which include:  Heavy periods.  Bleeding that is causing  anemia.  Irregular bleeding.  Bleeding fibroids on the lining inside the uterus if they are smaller than 3 centimeters. This procedure should not be done if:  You want children in the future.  You have severe cramps with your menstrual period.  You have precancerous or cancerous cells in your uterus.  You were recently pregnant.  You have gone through menopause.  You have had major surgery on the uterus, such as a cesarean delivery. LET [facility] CARE PROVIDER KNOW ABOUT:  Any allergies you have.  All medicines you are taking, including vitamins, herbs, eye drops, creams, and over-the-counter medicines.  Previous problems you or members of your family have had with the use of anesthetics.  Any blood disorders you have.  Previous surgeries you have had.  Medical conditions you have. RISKS AND COMPLICATIONS  Generally, this is a safe procedure. However, as with any procedure, complications can occur. Possible complications include:  Perforation of the uterus.  Bleeding.  Infection of the uterus, bladder, or vagina.  Injury to surrounding organs.  An air bubble to the lung (air embolus).  Pregnancy following the procedure.  Failure of the procedure to help the problem, requiring hysterectomy.  Decreased ability to diagnose cancer in the lining of the uterus. BEFORE THE PROCEDURE  The lining of the uterus must be tested to make sure there is no pre-cancerous or cancer cells present.  An ultrasound may be performed to look at the size of the uterus  and to check for abnormalities.  Medicines may be given to thin the lining of the uterus. PROCEDURE  During the procedure, your health care provider will use a tool called a resectoscope to help see inside your uterus. There are different ways to remove the lining of your uterus.   Radiofrequency - This method uses a radiofrequency-alternating electric current to remove the lining of the uterus.  Cryotherapy - This  method uses extreme cold to freeze the lining of the uterus.  Heated-Free Liquid - This method uses heated salt (saline) solution to remove the lining of the uterus.  Microwave - This method uses high-energy microwaves to heat up the lining of the uterus to remove it.  Thermal balloon - This method involves inserting a catheter with a balloon tip into the uterus. The balloon tip is filled with heated fluid to remove the lining of the uterus. AFTER THE PROCEDURE  After your procedure, do not have sexual intercourse or insert anything into your vagina until permitted by your health care provider. After the procedure, you may experience:  Cramps.  Vaginal discharge.  Frequent urination. Document Released: 07/31/2004 Document Revised: 05/24/2013 Document Reviewed: 02/22/2013 [facility] Patient Information 2015 [place], [place]. This information is not intended to replace advice given to you by your health care provider. Make sure you discuss any questions you have with your health care provider. Hysteroscopy Hysteroscopy is a procedure used for looking inside the womb (uterus). It may be done for various reasons, including:  To evaluate abnormal bleeding, fibroid (benign, noncancerous) tumors, polyps, scar tissue (adhesions), and possibly cancer of the uterus.  To look for lumps (tumors) and other uterine growths.  To look for causes of why a woman cannot get pregnant (infertility), causes of recurrent loss of pregnancy (miscarriages), or a lost intrauterine device (IUD).  To perform a sterilization by blocking the fallopian tubes from inside the uterus. In this procedure, a thin, flexible tube with a tiny light and camera on the end of it (hysteroscope) is used to look inside the uterus. A hysteroscopy should be done right after a menstrual period to be sure you are not pregnant. LET [facility] CARE PROVIDER KNOW ABOUT:   Any allergies you have.  All medicines you are taking, including  vitamins, herbs, eye drops, creams, and over-the-counter medicines.  Previous problems you or members of your family have had with the use of anesthetics.  Any blood disorders you have.  Previous surgeries you have had.  Medical conditions you have. RISKS AND COMPLICATIONS  Generally, this is a safe procedure. However, as with any procedure, complications can occur. Possible complications include:  Putting a hole in the uterus.  Excessive bleeding.  Infection.  Damage to the cervix.  Injury to other organs.  Allergic reaction to medicines.  Too much fluid used in the uterus for the procedure. BEFORE THE PROCEDURE   Ask your health care provider about changing or stopping any regular medicines.  Do not take aspirin or blood thinners for 1 week before the procedure, or as directed by your health care provider. These can cause bleeding.  If you smoke, do not smoke for 2 weeks before the procedure.  In some cases, a medicine is placed in the cervix the day before the procedure. This medicine makes the cervix have a larger opening (dilate). This makes it easier for the instrument to be inserted into the uterus during the procedure.  Do not eat or drink anything for at least 8 hours before  the surgery.  Arrange for someone to take you home after the procedure. PROCEDURE   You may be given a medicine to relax you (sedative). You may also be given one of the following:  A medicine that numbs the area around the cervix (local anesthetic).  A medicine that makes you sleep through the procedure (general anesthetic).  The hysteroscope is inserted through the vagina into the uterus. The camera on the hysteroscope sends a picture to a TV screen. This gives the surgeon a good view inside the uterus.  During the procedure, air or a liquid is put into the uterus, which allows the surgeon to see better.  Sometimes, tissue is gently scraped from inside the uterus. These tissue samples  are sent to a lab for testing. AFTER THE PROCEDURE   If you had a general anesthetic, you may be groggy for a couple hours after the procedure.  If you had a local anesthetic, you will be able to go home as soon as you are stable and feel ready.  You may have some cramping. This normally lasts for a couple days.  You may have bleeding, which varies from light spotting for a few days to menstrual-like bleeding for 3-7 days. This is normal.  If your test results are not back during the visit, make an appointment with your health care provider to find out the results. Document Released: 12/28/2000 Document Revised: 07/12/2013 Document Reviewed: 04/20/2013 [facility] Patient Information 2015 [place], [place]. This information is not intended to replace advice given to you by your health care provider. Make sure you discuss any questions you have with your health care provider. Dilation and Curettage or Vacuum Curettage Dilation and curettage (D&C) and vacuum curettage are minor procedures. A D&C involves stretching (dilation) the cervix and scraping (curettage) the inside lining of the womb (uterus). During a D&C, tissue is gently scraped from the inside lining of the uterus. During a vacuum curettage, the lining and tissue in the uterus are removed with the use of gentle suction.  Curettage may be performed to either diagnose or treat a problem. As a diagnostic procedure, curettage is performed to examine tissues from the uterus. A diagnostic curettage may be performed for the following symptoms:   Irregular bleeding in the uterus.   Bleeding with the development of clots.   Spotting between menstrual periods.   Prolonged menstrual periods.   Bleeding after menopause.   No menstrual period (amenorrhea).   A change in size and shape of the uterus.  As a treatment procedure, curettage may be performed for the following reasons:   Removal of an IUD (intrauterine device).   Removal  of retained placenta after giving birth. Retained placenta can cause an infection or bleeding severe enough to require transfusions.   Abortion.   Miscarriage.   Removal of polyps inside the uterus.   Removal of uncommon types of noncancerous lumps (fibroids).  LET [facility] CARE PROVIDER KNOW ABOUT:   Any allergies you have.   All medicines you are taking, including vitamins, herbs, eye drops, creams, and over-the-counter medicines.   Previous problems you or members of your family have had with the use of anesthetics.   Any blood disorders you have.   Previous surgeries you have had.   Medical conditions you have. RISKS AND COMPLICATIONS  Generally, this is a safe procedure. However, as with any procedure, complications can occur. Possible complications include:  Excessive bleeding.   Infection of the uterus.   Damage to  the cervix.   Development of scar tissue (adhesions) inside the uterus, later causing abnormal amounts of menstrual bleeding.   Complications from the general anesthetic, if a general anesthetic is used.   Putting a hole (perforation) in the uterus. This is rare.  BEFORE THE PROCEDURE   Eat and drink before the procedure only as directed by your health care provider.   Arrange for someone to take you home.  PROCEDURE  This procedure usually takes about 15-30 minutes.  You will be given one of the following:  A medicine that numbs the area in and around the cervix (local anesthetic).   A medicine to make you sleep through the procedure (general anesthetic).  You will lie on your back with your legs in stirrups.   A warm metal or plastic instrument (speculum) will be placed in your vagina to keep it open and to allow the health care provider to see the cervix.  There are two ways in which your cervix can be softened and dilated. These include:   Taking a medicine.   Having thin rods (laminaria) inserted into your  cervix.   A curved tool (curette) will be used to scrape cells from the inside lining of the uterus. In some cases, gentle suction is applied with the curette. The curette will then be removed.  AFTER THE PROCEDURE   You will rest in the recovery area until you are stable and are ready to go home.   You may feel sick to your stomach (nauseous) or throw up (vomit) if you were given a general anesthetic.   You may have a sore throat if a tube was placed in your throat during general anesthesia.   You may have light cramping and bleeding. This may last for 2 days to 2 weeks after the procedure.   Your uterus needs to make a new lining after the procedure. This may make your next period late. Document Released: 09/21/2005 Document Revised: 05/24/2013 Document Reviewed: 04/20/2013 [facility] Patient Information 2015 [place], [place]. This information is not intended to replace advice given to you by your health care provider. Make sure you discuss any questions you have with your health care provider. PATIENT INSTRUCTIONS POST-ANESTHESIA  IMMEDIATELY FOLLOWING SURGERY:  Do not drive or operate machinery for the first twenty four hours after surgery.  Do not make any important decisions for twenty four hours after surgery or while taking narcotic pain medications or sedatives.  If you develop intractable nausea and vomiting or a severe headache please notify your doctor immediately.  FOLLOW-UP:  Please make an appointment with your surgeon as instructed. You do not need to follow up with anesthesia unless specifically instructed to do so.  WOUND CARE INSTRUCTIONS (if applicable):  Keep a dry clean dressing on the anesthesia/puncture wound site if there is drainage.  Once the wound has quit draining you may leave it open to air.  Generally you should leave the bandage intact for twenty four hours unless there is drainage.  If the epidural site drains for more than 36-48 hours please call the  anesthesia department.  QUESTIONS?:  Please feel free to call your physician or the hospital operator if you have any questions, and they will be happy to assist you.

## 2015-05-30 ENCOUNTER — Other Ambulatory Visit: Payer: Self-pay | Admitting: Obstetrics and Gynecology

## 2015-05-30 ENCOUNTER — Encounter (HOSPITAL_COMMUNITY)
Admission: RE | Admit: 2015-05-30 | Discharge: 2015-05-30 | Disposition: A | Discharge status: Home / Self Care | Payer: Medicare Other | Source: Ambulatory Visit | Attending: Obstetrics and Gynecology | Admitting: Obstetrics and Gynecology

## 2015-05-30 ENCOUNTER — Encounter (HOSPITAL_COMMUNITY): Payer: Self-pay

## 2015-05-30 ENCOUNTER — Other Ambulatory Visit: Payer: Self-pay

## 2015-05-30 DIAGNOSIS — Z01818 Encounter for other preprocedural examination: Secondary | ICD-10-CM | POA: Insufficient documentation

## 2015-05-30 LAB — BASIC METABOLIC PANEL
ANION GAP: 9 (ref 5–15)
BUN: 16 mg/dL (ref 6–20)
CO2: 25 mmol/L (ref 22–32)
Calcium: 8.1 mg/dL — ABNORMAL LOW (ref 8.9–10.3)
Chloride: 105 mmol/L (ref 101–111)
Creatinine, Ser: 1.55 mg/dL — ABNORMAL HIGH (ref 0.44–1.00)
GFR calc Af Amer: 49 mL/min — ABNORMAL LOW (ref >60)
GFR, EST NON AFRICAN AMERICAN: 42 mL/min — AB (ref >60)
GLUCOSE: 99 mg/dL (ref 65–99)
POTASSIUM: 3.7 mmol/L (ref 3.5–5.1)
Sodium: 139 mmol/L (ref 135–145)

## 2015-05-30 LAB — HCG, SERUM, QUALITATIVE: Preg, Serum: NEGATIVE

## 2015-05-30 LAB — CBC
HEMATOCRIT: 31.8 % — AB (ref 36.0–46.0)
HEMOGLOBIN: 10.4 g/dL — AB (ref 12.0–15.0)
MCH: 26.2 pg (ref 26.0–34.0)
MCHC: 32.7 g/dL (ref 30.0–36.0)
MCV: 80.1 fL (ref 78.0–100.0)
Platelets: 224 10*3/uL (ref 150–400)
RBC: 3.97 MIL/uL (ref 3.87–5.11)
RDW: 13.2 % (ref 11.5–15.5)
WBC: 5 10*3/uL (ref 4.0–10.5)

## 2015-05-30 LAB — URINALYSIS, ROUTINE W REFLEX MICROSCOPIC
Bilirubin Urine: NEGATIVE
GLUCOSE, UA: NEGATIVE mg/dL
Hgb urine dipstick: NEGATIVE
Ketones, ur: NEGATIVE mg/dL
LEUKOCYTES UA: NEGATIVE
NITRITE: NEGATIVE
Specific Gravity, Urine: 1.025 (ref 1.005–1.030)
Urobilinogen, UA: 0.2 mg/dL (ref 0.0–1.0)
pH: 5.5 (ref 5.0–8.0)

## 2015-05-30 LAB — URINE MICROSCOPIC-ADD ON

## 2015-05-30 NOTE — Pre-Procedure Instructions (Signed)
Patient given information to sign up for my chart at home. 

## 2015-05-31 NOTE — Telephone Encounter (Signed)
Not available

## 2015-06-04 ENCOUNTER — Ambulatory Visit (HOSPITAL_COMMUNITY)
Admission: RE | Admit: 2015-06-04 | Payer: Medicare Other | Source: Ambulatory Visit | Admitting: Obstetrics and Gynecology

## 2015-06-04 ENCOUNTER — Encounter (HOSPITAL_COMMUNITY): Admission: RE | Payer: Self-pay | Source: Ambulatory Visit

## 2015-06-04 SURGERY — DILATATION & CURETTAGE/HYSTEROSCOPY WITH NOVASURE ABLATION
Anesthesia: General

## 2015-06-04 NOTE — H&P (Signed)
Expand All Collapse All   Patient ID: Raven Walker, female DOB: January 02, 1981, 34 y.o. MRN: [id number]  Preoperative History and Physical  Raven Walker is a 34 y.o. G1P1 here for surgical management of menorrhagia.  No significant preoperative concerns. She had a prolonged menses that lasted 3 wk, light, with resolution promptly with Megace use. SHG reviewed in its entirety and shows an anteflexed uterus with shg catheter smoothly passing to the apex of the uterus, with minimal endometrial tissue present. Benign tissue on biopsy done prior to last yr's ablation attempt.   Repeat ablation is recommended due to her status as arenal transplant pt, with advice from renal surgeons to avoid hysterectomy if possible due to close proximity to transplant. If future hyst is required, would refer to tertiary center. Proposed surgery: Repeat endometrial ablation using Novasure.; last performed 04/16/2014 with ThermaChoice.   Past Medical History  Diagnosis Date  . Hypertension   . Poor appetite     History  . GERD (gastroesophageal reflux disease) Dec. 2010    ulcerative esophagitis per EGD   . Secondary hyperparathyroidism     s/p parathyroidectomy with autotransplantation to left arm per Dr. Baker Pierini on 08/04/11  . Streptococcal peritonitis Sept. 2012  . SVD (spontaneous vaginal delivery)     x 1  . ESRD on peritoneal dialysis 2008    History -secondary to HTN and congenital single kidney  . Anemia     history  . Vaginal discharge 06/04/2014  . BV (bacterial vaginosis) 06/04/2014  . Urinary frequency 07/02/2014  . Hematuria 07/02/2014  . UTI (lower urinary tract infection) 07/02/2014   Past Surgical History  Procedure Laterality Date  . Arm graphs  2008    three - done at [facility]  . Parathyroidectomy  08/05/11    with graft of parathyroid into left upper arm.  . Kidney transplant  Right 2013    [place] tooth extraction    . Dilatation & curettage/hysteroscopy with trueclear N/A 04/16/2014    Procedure: DILATATION & CURETTAGE/HYSTEROSCOPY WITH TRUCLEAR AND RESECTION OF FIBROID/THERMACHOICE ABLATION; Surgeon: Jonnie Kind, MD; Location: [facility]; Service: Gynecology; Laterality: N/A;   OB History  Gravida Para Term Preterm AB SAB TAB Ectopic Multiple Living  $Remov'1 1        1    'TGHzoH$ # Outcome Date GA Lbr Len/2nd Weight Sex Delivery Anes PTL Lv  1 Para             Patient denies any other pertinent gynecologic issues.   Current Outpatient Prescriptions on File Prior to Visit  Medication Sig Dispense Refill  . Acetaminophen 500 MG coapsule Take by mouth.    . calcitRIOL (ROCALTROL) 0.5 MCG capsule Take 0.5 mcg by mouth 2 (two) times daily.    . calcium carbonate (TUMS - DOSED IN MG ELEMENTAL CALCIUM) 500 MG chewable tablet Chew 3 tablets by mouth 3 (three) times daily.    . Cholecalciferol 1000 UNITS tablet Take 1,000 Units by mouth daily.    Marland Kitchen diltiazem (CARDIZEM) 30 MG tablet Take 30 mg by mouth 2 (two) times daily.    Marland Kitchen epoetin alfa (EPOGEN,PROCRIT) 62947 UNIT/ML injection Inject 30,000 Units into the skin every 21 ( twenty-one) days. Last dose last 04/07/14    . estradiol (ESTRACE) 1 MG tablet Take 1 tablet (1 mg total) by mouth 2 (two) times daily. Take on any days of spotting 30 tablet 2  . ferrous sulfate 325 (65 FE) MG EC tablet  Take by mouth.    . fluconazole (DIFLUCAN) 150 MG tablet Take 1 now and 1 in 3 days 2 tablet 1  . furosemide (LASIX) 40 MG tablet Take 40 mg by mouth daily.    . Meth-Hyo-M Bl-Na Phos-Ph Sal (URIBEL) 118 MG CAPS Take 1 capsule (118 mg total) by mouth 4 (four) times daily. 20 capsule 1  . mycophenolate (CELLCEPT) 250 MG capsule Take 750 mg by mouth 2 (two) times daily.     . norethindrone  (MICRONOR,CAMILA,ERRIN) 0.35 MG tablet Take 1 tablet (0.35 mg total) by mouth daily. 1 Package 11  . norgestimate-ethinyl estradiol (ORTHO-CYCLEN,SPRINTEC,PREVIFEM) 0.25-35 MG-MCG tablet Take 1 tablet by mouth daily. Take only the active pills, continuous daily method 1 Package 11  . pantoprazole (PROTONIX) 40 MG tablet Take 40 mg by mouth 2 (two) times daily.     . phenazopyridine (PYRIDIUM) 200 MG tablet Take 1 tablet (200 mg total) by mouth 3 (three) times daily as needed for pain. 10 tablet 0  . predniSONE (DELTASONE) 5 MG tablet Take 5 mg by mouth daily.    Marland Kitchen PROGRAF 1 MG capsule Take 4-5 mg by mouth 2 (two) times daily. $RemoveBefo'4mg'IOJsxHkMwKw$  in the morning and $RemoveBef'5mg'mvIAifxeMl$  in the evening.    . sulfamethoxazole-trimethoprim (BACTRIM DS,SEPTRA DS) 800-160 MG per tablet Take 1 tablet by mouth 2 (two) times daily. 10 tablet 0   No current facility-administered medications on file prior to visit.   Allergies  Allergen Reactions  . Nickel Itching and Rash    Only where touched    Social History:  reports that she quit smoking about 15 years ago. Her smoking use included Cigarettes. She has a .5 pack-year smoking history. She has never used smokeless tobacco. She reports that she drinks alcohol. She reports that she does not use illicit drugs.  Family History  Problem Relation Age of Onset  . Hypertension Mother   . Hypertension Father   . Obesity Son   . Cancer Maternal Uncle   . Diabetes Maternal Grandmother   . Stroke Maternal Grandfather     Review of Systems: Noncontributory  PHYSICAL EXAM: Blood pressure 112/72, height 5' 9.5" (1.765 m), weight 204 lb (92.534 kg), last menstrual period 04/05/2015. General appearance - alert, well appearing, and in no distress Chest - clear to auscultation, no wheezes, rales or rhonchi, symmetric air entry Heart - normal rate and regular rhythm Abdomen - soft, nontender, nondistended, no masses  or organomegaly Pelvic - examination not indicated Uterus- retroverted uterus, normal support, 8-10 weeks size; firm, posterior wall of uterus Extremities - peripheral pulses normal, no pedal edema, no clubbing or cyanosis  Labs: No results found for this or any previous visit (from the past 336 hour(s)).  Imaging Studies:  Imaging Results    No results found.    Assessment: Patient Active Problem List   Diagnosis Date Noted  . Menorrhagia with regular cycle 03/14/2015  . Urinary frequency 07/02/2014  . Hematuria 07/02/2014  . UTI (lower urinary tract infection) 07/02/2014  . Vaginal discharge 06/04/2014  . BV (bacterial vaginosis) 06/04/2014  . Post-operative state 05/02/2014  . Submucous uterine fibroid with menorrhagia 02/05/2014  . Sepsis 11/20/2013  . UTI (urinary tract infection) 11/20/2013  . Myalgia 11/20/2013  . Acute on chronic renal failure 11/20/2013  . Leukocytosis, unspecified 11/20/2013  . Menorrhagia, premenopausal 11/15/2013  . Fever 08/30/2013  . Acute pyelonephritis 06/29/2013  . History of renal transplant 06/29/2013  . Hypocalcemia syndrome 08/19/2011    Class: Acute  .  Renal failure (ARF), acute on chronic 08/19/2011    Class: Acute  . HTN (hypertension) 08/19/2011    Class: Acute  . GERD (gastroesophageal reflux disease) 08/19/2011    Class: Chronic  . Hyperparathyroidism, secondary 05/19/2011   CBC    Component Value Date/Time   WBC 5.0 05/30/2015 0925   WBC 4.5 02/01/2009 1456   RBC 3.97 05/30/2015 0925   RBC 3.22* 06/29/2013 0028   RBC 4.22 02/01/2009 1456   HGB 10.4* 05/30/2015 0925   HGB 11.2* 02/01/2009 1456   HCT 31.8* 05/30/2015 0925   HCT 34.2* 02/01/2009 1456   PLT 224 05/30/2015 0925   PLT 221 02/01/2009 1456   MCV 80.1 05/30/2015 0925   MCV 80.9 02/01/2009 1456   MCH 26.2 05/30/2015 0925   MCH 26.6 02/01/2009 1456   MCHC 32.7 05/30/2015 0925    MCHC 32.8 02/01/2009 1456   RDW 13.2 05/30/2015 0925   RDW 16.9* 02/01/2009 1456   LYMPHSABS 1.0 08/30/2013 0141   LYMPHSABS 1.7 02/01/2009 1456   MONOABS 1.1* 08/30/2013 0141   MONOABS 0.3 02/01/2009 1456   EOSABS 0.0 08/30/2013 0141   EOSABS 0.3 02/01/2009 1456   BASOSABS 0.0 08/30/2013 0141   BASOSABS 0.0 02/01/2009 1456    BMET    Component Value Date/Time   NA 139 05/30/2015 0925   K 3.7 05/30/2015 0925   CL 105 05/30/2015 0925   CO2 25 05/30/2015 0925   GLUCOSE 99 05/30/2015 0925   BUN 16 05/30/2015 0925   CREATININE 1.55* 05/30/2015 0925   CALCIUM 8.1* 05/30/2015 0925   CALCIUM 5.9* 08/19/2011 0704   GFRNONAA 42* 05/30/2015 0925   GFRAA 49* 05/30/2015 0925         Plan: Patient will undergo surgical management with endometrial ablation, novasure on 04 June 2015  Kurk Corniel V  05/27/2015 2:27 PM

## 2015-06-06 ENCOUNTER — Encounter (HOSPITAL_COMMUNITY)
Admission: RE | Admit: 2015-06-06 | Discharge: 2015-06-06 | Disposition: A | Discharge status: Home / Self Care | Payer: Medicare Other | Source: Ambulatory Visit | Attending: Obstetrics and Gynecology | Admitting: Obstetrics and Gynecology

## 2015-06-11 ENCOUNTER — Encounter (HOSPITAL_COMMUNITY): Admission: RE | Payer: Self-pay | Source: Ambulatory Visit

## 2015-06-11 ENCOUNTER — Encounter: Payer: Medicare Other | Admitting: Obstetrics and Gynecology

## 2015-06-11 ENCOUNTER — Ambulatory Visit (HOSPITAL_COMMUNITY)
Admission: RE | Admit: 2015-06-11 | Payer: Medicare Other | Source: Ambulatory Visit | Admitting: Obstetrics and Gynecology

## 2015-06-11 ENCOUNTER — Telehealth: Payer: Self-pay | Admitting: Obstetrics and Gynecology

## 2015-06-11 ENCOUNTER — Other Ambulatory Visit (INDEPENDENT_AMBULATORY_CARE_PROVIDER_SITE_OTHER): Payer: Medicare Other | Admitting: *Deleted

## 2015-06-11 DIAGNOSIS — R809 Proteinuria, unspecified: Secondary | ICD-10-CM | POA: Diagnosis not present

## 2015-06-11 DIAGNOSIS — R319 Hematuria, unspecified: Secondary | ICD-10-CM

## 2015-06-11 LAB — POCT URINALYSIS DIPSTICK
GLUCOSE UA: NEGATIVE
Ketones, UA: NEGATIVE
Leukocytes, UA: NEGATIVE
NITRITE UA: NEGATIVE

## 2015-06-11 SURGERY — DILATATION & CURETTAGE/HYSTEROSCOPY WITH NOVASURE ABLATION
Anesthesia: General

## 2015-06-11 MED ORDER — CEPHALEXIN 500 MG PO CAPS: 500.0000 mg | ORAL_CAPSULE | Freq: Four times a day (QID) | ORAL | Status: DC

## 2015-06-11 NOTE — Progress Notes (Signed)
Pt came in for urine sample per Dr. Johnnye Sima instructions. I dipped urine and it showed a trace of protein and blood. Pt spoke with JAG and JAG advised to send urine for UA and Culture. [place]

## 2015-06-11 NOTE — Telephone Encounter (Signed)
Pt having uti sx, will obtain culture and treat for uti. Pt will submit u/a , c&s this a.m. Importantly, Will cancel case until further testing, will order MRI to assess endometrial thickness over fibroid before proceeding.

## 2015-06-12 ENCOUNTER — Telehealth: Payer: Self-pay | Admitting: *Deleted

## 2015-06-12 ENCOUNTER — Encounter: Payer: Medicare Other | Admitting: Obstetrics and Gynecology

## 2015-06-12 LAB — URINALYSIS, ROUTINE W REFLEX MICROSCOPIC
Bilirubin, UA: NEGATIVE
Glucose, UA: NEGATIVE
Ketones, UA: NEGATIVE
Leukocytes, UA: NEGATIVE
Nitrite, UA: NEGATIVE
RBC, UA: NEGATIVE
Specific Gravity, UA: 1.023 (ref 1.005–1.030)
Urobilinogen, Ur: 0.2 mg/dL (ref 0.2–1.0)
pH, UA: 6 (ref 5.0–7.5)

## 2015-06-12 LAB — MICROSCOPIC EXAMINATION
Casts: NONE SEEN /LPF
RBC, UA: NONE SEEN /HPF

## 2015-06-12 NOTE — Telephone Encounter (Signed)
Pt aware that culture results are not back yet and that the UA only showed a trace of protein. I advised the pt that I would keep this message in my in basket and look again tomorrow and call her back if results come back. Pt verbalized understanding.

## 2015-06-12 NOTE — Telephone Encounter (Signed)
Ferg, you wanted me to send you this to remind you about this pt!

## 2015-06-13 LAB — URINE CULTURE

## 2015-06-14 DIAGNOSIS — N183 Chronic kidney disease, stage 3 (moderate): Secondary | ICD-10-CM | POA: Diagnosis not present

## 2015-06-14 DIAGNOSIS — Z940 Kidney transplant status: Secondary | ICD-10-CM | POA: Diagnosis not present

## 2015-06-17 ENCOUNTER — Ambulatory Visit: Payer: Medicare Other | Admitting: Obstetrics and Gynecology

## 2015-06-18 DIAGNOSIS — D631 Anemia in chronic kidney disease: Secondary | ICD-10-CM | POA: Diagnosis not present

## 2015-06-18 DIAGNOSIS — I129 Hypertensive chronic kidney disease with stage 1 through stage 4 chronic kidney disease, or unspecified chronic kidney disease: Secondary | ICD-10-CM | POA: Diagnosis not present

## 2015-06-18 DIAGNOSIS — N183 Chronic kidney disease, stage 3 (moderate): Secondary | ICD-10-CM | POA: Diagnosis not present

## 2015-06-18 DIAGNOSIS — Z940 Kidney transplant status: Secondary | ICD-10-CM | POA: Diagnosis not present

## 2015-07-05 DIAGNOSIS — Z23 Encounter for immunization: Secondary | ICD-10-CM | POA: Diagnosis not present

## 2015-07-05 DIAGNOSIS — Z940 Kidney transplant status: Secondary | ICD-10-CM | POA: Diagnosis not present

## 2015-07-23 DIAGNOSIS — R509 Fever, unspecified: Secondary | ICD-10-CM | POA: Diagnosis not present

## 2015-07-23 DIAGNOSIS — R5381 Other malaise: Secondary | ICD-10-CM | POA: Diagnosis not present

## 2015-07-23 DIAGNOSIS — I10 Essential (primary) hypertension: Secondary | ICD-10-CM | POA: Diagnosis not present

## 2015-07-23 DIAGNOSIS — Z87440 Personal history of urinary (tract) infections: Secondary | ICD-10-CM | POA: Diagnosis not present

## 2015-07-23 DIAGNOSIS — Z79899 Other long term (current) drug therapy: Secondary | ICD-10-CM | POA: Diagnosis not present

## 2015-07-23 DIAGNOSIS — Z940 Kidney transplant status: Secondary | ICD-10-CM | POA: Diagnosis not present

## 2015-07-23 DIAGNOSIS — R1110 Vomiting, unspecified: Secondary | ICD-10-CM | POA: Diagnosis not present

## 2015-07-23 DIAGNOSIS — Z7952 Long term (current) use of systemic steroids: Secondary | ICD-10-CM | POA: Diagnosis not present

## 2015-07-23 DIAGNOSIS — R1030 Lower abdominal pain, unspecified: Secondary | ICD-10-CM | POA: Diagnosis not present

## 2015-08-01 ENCOUNTER — Encounter: Payer: Self-pay | Admitting: Obstetrics and Gynecology

## 2015-08-01 ENCOUNTER — Ambulatory Visit (INDEPENDENT_AMBULATORY_CARE_PROVIDER_SITE_OTHER): Payer: Medicare Other | Admitting: Obstetrics and Gynecology

## 2015-08-01 VITALS — BP 120/80 | Ht 69.5 in | Wt 202.0 lb

## 2015-08-01 DIAGNOSIS — D250 Submucous leiomyoma of uterus: Secondary | ICD-10-CM | POA: Diagnosis not present

## 2015-08-01 NOTE — Progress Notes (Signed)
Patient ID: Raven Walker, female   DOB: September 02, 1981, 34 y.o.   MRN: [id number] Pt here today to discuss fibroids and what options she has.

## 2015-08-01 NOTE — Progress Notes (Signed)
Patient ID: [name], female   DOB: January 10, 1981, 34 y.o.   MRN: [id number]    [facility] Visit  Patient name: Raven Walker MRN [id number]  Date of birth: Mar 08, 1981  CC & HPI:  Raven Walker is a 34 y.o. female presenting today for a discussion of her uterine fibroids. She has previously had an endometrial ablation for her menorrhagia and it was unsuccessful. She states she is still having intermittent bleeding along with pain and wants to discuss surgical options. Pt also wanted info on fibroid embolization   ROS:  10 Systems reviewed and all are negative for acute change except as noted in the HPI.  Pertinent History Reviewed:   Reviewed: Significant for uterine fibroids Medical         Past Medical History  Diagnosis Date  . Hypertension   . Poor appetite     History  . GERD (gastroesophageal reflux disease) Dec. 2010    ulcerative esophagitis per EGD   . Secondary hyperparathyroidism ([place])     s/p parathyroidectomy with autotransplantation to left arm per Dr. Baker Pierini on 08/04/11  . Streptococcal peritonitis ([place])  Sept. 2012  . SVD (spontaneous vaginal delivery)     x 1  . Anemia     history  . Vaginal discharge 06/04/2014  . BV (bacterial vaginosis) 06/04/2014  . Urinary frequency 07/02/2014  . Hematuria 07/02/2014  . UTI (lower urinary tract infection) 07/02/2014  . ESRD on peritoneal dialysis [facility]) 2008    pt had kidney transplant 06/3012 and is no longer on dialysis                              Surgical Hx:    Past Surgical History  Procedure Laterality Date  . Arm graphs  2008    three - done at [facility]; still in but never used because pt got a transplant.  . Parathyroidectomy  08/05/11    with graft of parathyroid into left upper arm.  . Kidney transplant Right 2013    [place] tooth extraction    . Dilatation & curettage/hysteroscopy with trueclear N/A 04/16/2014    Procedure: DILATATION &  CURETTAGE/HYSTEROSCOPY WITH TRUCLEAR AND RESECTION OF FIBROID/THERMACHOICE ABLATION;  Surgeon: Jonnie Kind, MD;  Location: [facility] [facility];  Service: Gynecology;  Laterality: N/A;   Medications: Reviewed & Updated - see associated section                       Current outpatient prescriptions:  .  Acetaminophen 500 MG coapsule, Take 500-1,000 mg by mouth every 6 (six) hours as needed for pain. , Disp: , Rfl:  .  calcitRIOL (ROCALTROL) 0.5 MCG capsule, Take 0.5 mcg by mouth 2 (two) times daily., Disp: , Rfl:  .  calcium carbonate (TUMS - DOSED IN MG ELEMENTAL CALCIUM) 500 MG chewable tablet, Chew 3 tablets by mouth 3 (three) times daily., Disp: , Rfl:  .  cephALEXin (KEFLEX) 500 MG capsule, Take 1 capsule (500 mg total) by mouth 4 (four) times daily., Disp: 28 capsule, Rfl: 0 .  Cholecalciferol 1000 UNITS tablet, Take 1,000 Units by mouth daily., Disp: , Rfl:  .  diltiazem (CARDIZEM) 30 MG tablet, Take 30 mg by mouth 2 (two) times daily., Disp: , Rfl:  .  epoetin alfa (EPOGEN,PROCRIT) 78295 UNIT/ML injection, Inject 30,000 Units into the skin every 21 ( twenty-one) days.  Last dose last 04/07/14, Disp: , Rfl:  .  ferrous sulfate 325 (65 FE) MG EC tablet, Take 325 mg by mouth daily. , Disp: , Rfl:  .  furosemide (LASIX) 40 MG tablet, Take 40 mg by mouth daily., Disp: , Rfl:  .  mycophenolate (CELLCEPT) 250 MG capsule, Take 750 mg by mouth 2 (two) times daily. , Disp: , Rfl:  .  norgestimate-ethinyl estradiol (ORTHO-CYCLEN,SPRINTEC,PREVIFEM) 0.25-35 MG-MCG tablet, Take 1 tablet by mouth daily. Take only the active pills, continuous daily method, Disp: 1 Package, Rfl: 11 .  pantoprazole (PROTONIX) 40 MG tablet, Take 40 mg by mouth 2 (two) times daily. , Disp: , Rfl:  .  predniSONE (DELTASONE) 5 MG tablet, Take 5 mg by mouth daily., Disp: , Rfl:  .  PROGRAF 1 MG capsule, Take 4-5 mg by mouth 2 (two) times daily. $RemoveBefo'4mg'oZuCHElbVQw$  in the morning and $RemoveBef'5mg'RvWyHruCpq$  in the evening., Disp: , Rfl:    Social History: Reviewed -  reports  that she quit smoking about 15 years ago. Her smoking use included Cigarettes. She has a .5 pack-year smoking history. She has never used smokeless tobacco.  Objective Findings:  Vitals: Blood pressure 120/80, height 5' 9.5" (1.765 m), weight 202 lb (91.627 kg).  Physical Examination: no exam discussion only   Assessment & Plan:   A:  1. Discussed various types of hysterectomy for mgmt of uterine fibroids  P:  1. Have pt follow up in 2 weeks; Will call Early Osmond NP, kidney transplant coordinator at [facility], NP  2 By signing my name below, I, Erling Conte, attest that this documentation has been prepared under the direction and in the presence of Jonnie Kind, MD. Electronically Signed: Erling Conte, ED Scribe. 08/01/2015. 11:14 AM.  I personally performed the services described in this documentation, which was SCRIBED in my presence. The recorded information has been reviewed and considered accurate. It has been edited as necessary during review. Jonnie Kind, MD

## 2015-08-15 ENCOUNTER — Ambulatory Visit (INDEPENDENT_AMBULATORY_CARE_PROVIDER_SITE_OTHER): Payer: Medicare Other | Admitting: Obstetrics and Gynecology

## 2015-08-15 ENCOUNTER — Encounter: Payer: Self-pay | Admitting: Obstetrics and Gynecology

## 2015-08-15 VITALS — BP 120/82 | Ht 69.5 in | Wt 206.5 lb

## 2015-08-15 DIAGNOSIS — N189 Chronic kidney disease, unspecified: Secondary | ICD-10-CM

## 2015-08-15 DIAGNOSIS — N939 Abnormal uterine and vaginal bleeding, unspecified: Secondary | ICD-10-CM | POA: Diagnosis not present

## 2015-08-15 DIAGNOSIS — N920 Excessive and frequent menstruation with regular cycle: Secondary | ICD-10-CM | POA: Diagnosis not present

## 2015-08-15 DIAGNOSIS — N179 Acute kidney failure, unspecified: Secondary | ICD-10-CM | POA: Diagnosis not present

## 2015-08-15 NOTE — Progress Notes (Signed)
Patient ID: Raven Walker, female   DOB: 1981/07/21, 33 y.o.   MRN: [id number] Pt here today for follow up  Visit.

## 2015-08-15 NOTE — Progress Notes (Signed)
Patient ID: Raven Walker, female   DOB: [birth year], 34 y.o.   MRN: [id number]  [facility]  Patient name: Raven Walker MRN [id number]  Date of birth: 07/28/1981  CC & HPI:  Raven Walker is a 34 y.o. female presenting today for follow up of her uterine fibroids. She has previously had an endometrial ablation for her menorrhagia and it was unsuccessful. She states she is still having intermittent bleeding due to a moderately enlarged uterus. Pt wants to discuss possible surgical options. Pt is s/p kidney transplant and her kidney transplant coordinator is Raven Walker at [facility].  ROS:  PER HPI  Pertinent History Reviewed:  Medical & Surgical Hx:  Reviewed: Significant for HTN, renal failure Medications: Reviewed & Updated - See associated section in EMR Social History: Reviewed -  reports that she quit smoking about 15 years ago. Her smoking use included Cigarettes. She has a .5 pack-year smoking history. She has never used smokeless tobacco.   Objective Findings:  Vitals: BP 120/82 mmHg  Ht 5' 9.5" (1.765 m)  Wt 206 lb 8 oz (93.668 kg)  BMI 30.07 kg/m2 Physical Examination: discussion only- 15 minutes  Assessment & Plan:   A:  1.heavy bleeding after ablation 2. Uterine fibroids 3. S/p renal transplant.  P: 1. Refer to [facility] for hysterectomy, call made to Raven Walker, coordinator who will arrange referral for hysterectomy at [facility] 2. Will send refill for Megace  By signing my name below, I, Erling Conte, attest that this documentation has been prepared under the direction and in the presence of Jonnie Kind, MD. Electronically Signed: Erling Conte, ED Scribe. 08/15/2015. 10:22 AM.  I personally performed the services described in this documentation, which was SCRIBED in my presence. The recorded information has been reviewed and considered accurate. It has been edited as necessary during review. Jonnie Kind, MD

## 2015-08-19 DIAGNOSIS — N926 Irregular menstruation, unspecified: Secondary | ICD-10-CM | POA: Diagnosis not present

## 2015-08-19 DIAGNOSIS — D259 Leiomyoma of uterus, unspecified: Secondary | ICD-10-CM | POA: Diagnosis not present

## 2015-08-19 DIAGNOSIS — Z79899 Other long term (current) drug therapy: Secondary | ICD-10-CM | POA: Diagnosis not present

## 2015-08-19 DIAGNOSIS — Z8639 Personal history of other endocrine, nutritional and metabolic disease: Secondary | ICD-10-CM | POA: Diagnosis not present

## 2015-08-19 DIAGNOSIS — Z940 Kidney transplant status: Secondary | ICD-10-CM | POA: Diagnosis not present

## 2015-08-24 ENCOUNTER — Other Ambulatory Visit: Payer: Self-pay | Admitting: Obstetrics and Gynecology

## 2015-09-09 DIAGNOSIS — Z79899 Other long term (current) drug therapy: Secondary | ICD-10-CM | POA: Diagnosis not present

## 2015-09-09 DIAGNOSIS — D259 Leiomyoma of uterus, unspecified: Secondary | ICD-10-CM | POA: Diagnosis not present

## 2015-09-09 DIAGNOSIS — Z940 Kidney transplant status: Secondary | ICD-10-CM | POA: Diagnosis not present

## 2015-09-09 DIAGNOSIS — Z0389 Encounter for observation for other suspected diseases and conditions ruled out: Secondary | ICD-10-CM | POA: Diagnosis not present

## 2015-09-09 DIAGNOSIS — I10 Essential (primary) hypertension: Secondary | ICD-10-CM | POA: Diagnosis not present

## 2015-09-16 DIAGNOSIS — R894 Abnormal immunological findings in specimens from other organs, systems and tissues: Secondary | ICD-10-CM | POA: Diagnosis not present

## 2015-09-16 DIAGNOSIS — Z940 Kidney transplant status: Secondary | ICD-10-CM | POA: Diagnosis not present

## 2015-09-16 DIAGNOSIS — Z87440 Personal history of urinary (tract) infections: Secondary | ICD-10-CM | POA: Diagnosis not present

## 2015-09-16 DIAGNOSIS — I129 Hypertensive chronic kidney disease with stage 1 through stage 4 chronic kidney disease, or unspecified chronic kidney disease: Secondary | ICD-10-CM | POA: Diagnosis not present

## 2015-09-16 DIAGNOSIS — N183 Chronic kidney disease, stage 3 (moderate): Secondary | ICD-10-CM | POA: Diagnosis not present

## 2015-09-16 DIAGNOSIS — N2581 Secondary hyperparathyroidism of renal origin: Secondary | ICD-10-CM | POA: Diagnosis not present

## 2015-09-16 DIAGNOSIS — D631 Anemia in chronic kidney disease: Secondary | ICD-10-CM | POA: Diagnosis not present

## 2015-09-16 DIAGNOSIS — R635 Abnormal weight gain: Secondary | ICD-10-CM | POA: Diagnosis not present

## 2015-09-16 DIAGNOSIS — E782 Mixed hyperlipidemia: Secondary | ICD-10-CM | POA: Diagnosis not present

## 2015-09-23 ENCOUNTER — Encounter (HOSPITAL_COMMUNITY)
Admission: RE | Admit: 2015-09-23 | Discharge: 2015-09-23 | Disposition: A | Discharge status: Home / Self Care | Payer: Medicare Other | Source: Ambulatory Visit | Attending: Nephrology | Admitting: Nephrology

## 2015-09-23 DIAGNOSIS — D509 Iron deficiency anemia, unspecified: Secondary | ICD-10-CM | POA: Insufficient documentation

## 2015-09-23 MED ORDER — FERUMOXYTOL INJECTION 510 MG/17 ML
510.0000 mg | Freq: Once | INTRAVENOUS | Status: AC
  Administered 2015-09-23: 510 mg via INTRAVENOUS
  Filled 2015-09-23: qty 17

## 2015-09-23 MED ORDER — SODIUM CHLORIDE 0.9 % IV SOLN
INTRAVENOUS | Status: DC
  Administered 2015-09-23: 250 mL via INTRAVENOUS

## 2015-09-26 ENCOUNTER — Ambulatory Visit (INDEPENDENT_AMBULATORY_CARE_PROVIDER_SITE_OTHER): Payer: Medicare Other | Admitting: Obstetrics & Gynecology

## 2015-09-26 ENCOUNTER — Other Ambulatory Visit: Payer: Self-pay | Admitting: Adult Health

## 2015-09-26 VITALS — BP 130/100 | HR 80 | Wt 207.0 lb

## 2015-09-26 DIAGNOSIS — B3731 Acute candidiasis of vulva and vagina: Secondary | ICD-10-CM

## 2015-09-26 DIAGNOSIS — B373 Candidiasis of vulva and vagina: Secondary | ICD-10-CM | POA: Diagnosis not present

## 2015-09-26 DIAGNOSIS — Z940 Kidney transplant status: Secondary | ICD-10-CM | POA: Diagnosis not present

## 2015-09-26 MED ORDER — FLUCONAZOLE 150 MG PO TABS: 150.0000 mg | ORAL_TABLET | Freq: Once | ORAL | Status: DC

## 2015-09-26 NOTE — Progress Notes (Signed)
Patient ID: Raven Walker, female   DOB: 08-03-1981, 34 y.o.   MRN: [id number]      Chief Complaint  Patient presents with  . gyn visit    vaginal irritation.    Blood pressure 130/100, pulse 80, weight 207 lb (93.895 kg), last menstrual period 09/11/2015.  34 y.o. G1P1 Patient's last menstrual period was 09/11/2015. The current method of family planning is OCP.  Subjective Pt with vulvar/vaginal irritation for 2 days  Objective +yeast  Pertinent ROS   Labs or studies     Impression Diagnoses this Encounter::   ICD-9-CM ICD-10-CM   1. Yeast vaginitis 112.1 B37.3     Established relevant diagnosis(es):   Plan/Recommendations: Meds ordered this encounter  Medications  . fluconazole (DIFLUCAN) 150 MG tablet    Sig: Take 1 tablet (150 mg total) by mouth once. Take the second tablet 3 days after the first one.    Dispense:  2 tablet    Refill:  2    Labs or Scans Ordered: No orders of the defined types were placed in this encounter.    Management::   Follow up prn           All questions were answered.  Past Medical History  Diagnosis Date  . Hypertension   . Poor appetite     History  . GERD (gastroesophageal reflux disease) Dec. 2010    ulcerative esophagitis per EGD   . Secondary hyperparathyroidism ([place])     s/p parathyroidectomy with autotransplantation to left arm per Dr. Baker Pierini on 08/04/11  . Streptococcal peritonitis ([place])  Sept. 2012  . SVD (spontaneous vaginal delivery)     x 1  . Anemia     history  . Vaginal discharge 06/04/2014  . BV (bacterial vaginosis) 06/04/2014  . Urinary frequency 07/02/2014  . Hematuria 07/02/2014  . UTI (lower urinary tract infection) 07/02/2014  . ESRD on peritoneal dialysis [facility]) 2008    pt had kidney transplant 06/3012 and is no longer on dialysis    Past Surgical History  Procedure Laterality Date  . Arm graphs  2008    three - done at [facility]; still in but never used because pt  got a transplant.  . Parathyroidectomy  08/05/11    with graft of parathyroid into left upper arm.  . Kidney transplant Right 2013    [place] tooth extraction    . Dilatation & curettage/hysteroscopy with trueclear N/A 04/16/2014    Procedure: DILATATION & CURETTAGE/HYSTEROSCOPY WITH TRUCLEAR AND RESECTION OF FIBROID/THERMACHOICE ABLATION;  Surgeon: Jonnie Kind, MD;  Location: [facility];  Service: Gynecology;  Laterality: N/A;    OB History    Gravida Para Term Preterm AB TAB SAB Ectopic Multiple Living   '1 1        1      '$ Allergies  Allergen Reactions  . Nickel Itching and Rash    Only where touched    Social History   Social History  . Marital Status: Single    Spouse Name: N/A  . Number of Children: N/A  . Years of Education: N/A   Social History Main Topics  . Smoking status: Former Smoker -- 0.25 packs/day for 2 years    Types: Cigarettes    Quit date: 10/06/1999  . Smokeless tobacco: Never Used  . Alcohol Use: Yes     Comment: Once per month  . Drug Use: No  . Sexual Activity: Yes  Birth Control/ Protection: None     Comment: ablation   Other Topics Concern  . Not on file   Social History Narrative    Family History  Problem Relation Age of Onset  . Hypertension Mother   . Hypertension Father   . Obesity Son   . Cancer Maternal Uncle   . Diabetes Maternal Grandmother   . Stroke Maternal Grandfather

## 2015-10-09 DIAGNOSIS — Z940 Kidney transplant status: Secondary | ICD-10-CM | POA: Diagnosis not present

## 2015-10-09 DIAGNOSIS — N189 Chronic kidney disease, unspecified: Secondary | ICD-10-CM | POA: Diagnosis not present

## 2015-10-24 DIAGNOSIS — N183 Chronic kidney disease, stage 3 (moderate): Secondary | ICD-10-CM | POA: Diagnosis not present

## 2015-10-24 DIAGNOSIS — Z940 Kidney transplant status: Secondary | ICD-10-CM | POA: Diagnosis not present

## 2015-11-15 DIAGNOSIS — N939 Abnormal uterine and vaginal bleeding, unspecified: Secondary | ICD-10-CM | POA: Diagnosis not present

## 2015-11-15 DIAGNOSIS — Z940 Kidney transplant status: Secondary | ICD-10-CM | POA: Diagnosis not present

## 2015-11-15 DIAGNOSIS — N921 Excessive and frequent menstruation with irregular cycle: Secondary | ICD-10-CM | POA: Diagnosis not present

## 2015-11-15 DIAGNOSIS — Z01411 Encounter for gynecological examination (general) (routine) with abnormal findings: Secondary | ICD-10-CM | POA: Diagnosis not present

## 2015-11-15 DIAGNOSIS — Z124 Encounter for screening for malignant neoplasm of cervix: Secondary | ICD-10-CM | POA: Diagnosis not present

## 2015-11-15 DIAGNOSIS — Z79899 Other long term (current) drug therapy: Secondary | ICD-10-CM | POA: Diagnosis not present

## 2015-11-15 DIAGNOSIS — Z9889 Other specified postprocedural states: Secondary | ICD-10-CM | POA: Diagnosis not present

## 2015-11-15 DIAGNOSIS — Z1151 Encounter for screening for human papillomavirus (HPV): Secondary | ICD-10-CM | POA: Diagnosis not present

## 2015-11-15 DIAGNOSIS — Z5181 Encounter for therapeutic drug level monitoring: Secondary | ICD-10-CM | POA: Diagnosis not present

## 2015-12-17 ENCOUNTER — Other Ambulatory Visit: Payer: Self-pay | Admitting: Obstetrics and Gynecology

## 2015-12-17 DIAGNOSIS — R635 Abnormal weight gain: Secondary | ICD-10-CM | POA: Diagnosis not present

## 2015-12-17 DIAGNOSIS — E782 Mixed hyperlipidemia: Secondary | ICD-10-CM | POA: Diagnosis not present

## 2015-12-17 DIAGNOSIS — Z87440 Personal history of urinary (tract) infections: Secondary | ICD-10-CM | POA: Diagnosis not present

## 2015-12-17 DIAGNOSIS — R894 Abnormal immunological findings in specimens from other organs, systems and tissues: Secondary | ICD-10-CM | POA: Diagnosis not present

## 2015-12-17 DIAGNOSIS — D631 Anemia in chronic kidney disease: Secondary | ICD-10-CM | POA: Diagnosis not present

## 2015-12-17 DIAGNOSIS — N2581 Secondary hyperparathyroidism of renal origin: Secondary | ICD-10-CM | POA: Diagnosis not present

## 2015-12-17 DIAGNOSIS — N183 Chronic kidney disease, stage 3 (moderate): Secondary | ICD-10-CM | POA: Diagnosis not present

## 2015-12-17 DIAGNOSIS — K210 Gastro-esophageal reflux disease with esophagitis: Secondary | ICD-10-CM | POA: Diagnosis not present

## 2015-12-17 DIAGNOSIS — Z940 Kidney transplant status: Secondary | ICD-10-CM | POA: Diagnosis not present

## 2015-12-17 DIAGNOSIS — I129 Hypertensive chronic kidney disease with stage 1 through stage 4 chronic kidney disease, or unspecified chronic kidney disease: Secondary | ICD-10-CM | POA: Diagnosis not present

## 2015-12-25 DIAGNOSIS — N189 Chronic kidney disease, unspecified: Secondary | ICD-10-CM | POA: Diagnosis not present

## 2015-12-25 DIAGNOSIS — Z940 Kidney transplant status: Secondary | ICD-10-CM | POA: Diagnosis not present

## 2015-12-26 ENCOUNTER — Other Ambulatory Visit: Payer: Self-pay | Admitting: Obstetrics and Gynecology

## 2016-01-20 ENCOUNTER — Other Ambulatory Visit: Payer: Self-pay | Admitting: Obstetrics and Gynecology

## 2016-02-06 DIAGNOSIS — D259 Leiomyoma of uterus, unspecified: Secondary | ICD-10-CM | POA: Diagnosis not present

## 2016-02-06 DIAGNOSIS — Z79899 Other long term (current) drug therapy: Secondary | ICD-10-CM | POA: Diagnosis not present

## 2016-02-06 DIAGNOSIS — N920 Excessive and frequent menstruation with regular cycle: Secondary | ICD-10-CM | POA: Diagnosis not present

## 2016-02-06 DIAGNOSIS — N939 Abnormal uterine and vaginal bleeding, unspecified: Secondary | ICD-10-CM | POA: Diagnosis not present

## 2016-02-06 DIAGNOSIS — Z940 Kidney transplant status: Secondary | ICD-10-CM | POA: Diagnosis not present

## 2016-02-14 DIAGNOSIS — N186 End stage renal disease: Secondary | ICD-10-CM | POA: Diagnosis not present

## 2016-02-14 DIAGNOSIS — N939 Abnormal uterine and vaginal bleeding, unspecified: Secondary | ICD-10-CM | POA: Diagnosis not present

## 2016-02-14 DIAGNOSIS — D259 Leiomyoma of uterus, unspecified: Secondary | ICD-10-CM | POA: Diagnosis not present

## 2016-02-14 DIAGNOSIS — Z940 Kidney transplant status: Secondary | ICD-10-CM | POA: Diagnosis not present

## 2016-02-14 DIAGNOSIS — N2581 Secondary hyperparathyroidism of renal origin: Secondary | ICD-10-CM | POA: Diagnosis not present

## 2016-02-14 DIAGNOSIS — I120 Hypertensive chronic kidney disease with stage 5 chronic kidney disease or end stage renal disease: Secondary | ICD-10-CM | POA: Diagnosis not present

## 2016-02-14 DIAGNOSIS — Z79899 Other long term (current) drug therapy: Secondary | ICD-10-CM | POA: Diagnosis not present

## 2016-02-14 DIAGNOSIS — E7800 Pure hypercholesterolemia, unspecified: Secondary | ICD-10-CM | POA: Diagnosis not present

## 2016-03-09 DIAGNOSIS — Z01818 Encounter for other preprocedural examination: Secondary | ICD-10-CM | POA: Diagnosis not present

## 2016-03-23 DIAGNOSIS — E782 Mixed hyperlipidemia: Secondary | ICD-10-CM | POA: Diagnosis not present

## 2016-03-23 DIAGNOSIS — R635 Abnormal weight gain: Secondary | ICD-10-CM | POA: Diagnosis not present

## 2016-03-23 DIAGNOSIS — K210 Gastro-esophageal reflux disease with esophagitis: Secondary | ICD-10-CM | POA: Diagnosis not present

## 2016-03-23 DIAGNOSIS — Z87440 Personal history of urinary (tract) infections: Secondary | ICD-10-CM | POA: Diagnosis not present

## 2016-03-23 DIAGNOSIS — Z940 Kidney transplant status: Secondary | ICD-10-CM | POA: Diagnosis not present

## 2016-03-23 DIAGNOSIS — D631 Anemia in chronic kidney disease: Secondary | ICD-10-CM | POA: Diagnosis not present

## 2016-03-23 DIAGNOSIS — N183 Chronic kidney disease, stage 3 (moderate): Secondary | ICD-10-CM | POA: Diagnosis not present

## 2016-03-23 DIAGNOSIS — I129 Hypertensive chronic kidney disease with stage 1 through stage 4 chronic kidney disease, or unspecified chronic kidney disease: Secondary | ICD-10-CM | POA: Diagnosis not present

## 2016-03-23 DIAGNOSIS — R894 Abnormal immunological findings in specimens from other organs, systems and tissues: Secondary | ICD-10-CM | POA: Diagnosis not present

## 2016-03-23 DIAGNOSIS — N2581 Secondary hyperparathyroidism of renal origin: Secondary | ICD-10-CM | POA: Diagnosis not present

## 2016-03-27 DIAGNOSIS — E669 Obesity, unspecified: Secondary | ICD-10-CM | POA: Diagnosis not present

## 2016-03-27 DIAGNOSIS — N2581 Secondary hyperparathyroidism of renal origin: Secondary | ICD-10-CM | POA: Diagnosis not present

## 2016-03-27 DIAGNOSIS — N920 Excessive and frequent menstruation with regular cycle: Secondary | ICD-10-CM | POA: Diagnosis not present

## 2016-03-27 DIAGNOSIS — Z940 Kidney transplant status: Secondary | ICD-10-CM | POA: Diagnosis not present

## 2016-03-27 DIAGNOSIS — D649 Anemia, unspecified: Secondary | ICD-10-CM | POA: Diagnosis not present

## 2016-03-27 DIAGNOSIS — E7800 Pure hypercholesterolemia, unspecified: Secondary | ICD-10-CM | POA: Diagnosis not present

## 2016-03-27 DIAGNOSIS — D259 Leiomyoma of uterus, unspecified: Secondary | ICD-10-CM | POA: Diagnosis not present

## 2016-03-27 DIAGNOSIS — I10 Essential (primary) hypertension: Secondary | ICD-10-CM | POA: Diagnosis not present

## 2016-04-22 DIAGNOSIS — Z4822 Encounter for aftercare following kidney transplant: Secondary | ICD-10-CM | POA: Diagnosis not present

## 2016-04-22 DIAGNOSIS — Z48816 Encounter for surgical aftercare following surgery on the genitourinary system: Secondary | ICD-10-CM | POA: Diagnosis not present

## 2016-04-22 DIAGNOSIS — Z940 Kidney transplant status: Secondary | ICD-10-CM | POA: Diagnosis not present

## 2016-04-22 DIAGNOSIS — Z79899 Other long term (current) drug therapy: Secondary | ICD-10-CM | POA: Diagnosis not present

## 2016-04-22 DIAGNOSIS — Z87440 Personal history of urinary (tract) infections: Secondary | ICD-10-CM | POA: Diagnosis not present

## 2016-04-22 DIAGNOSIS — I10 Essential (primary) hypertension: Secondary | ICD-10-CM | POA: Diagnosis not present

## 2016-04-22 DIAGNOSIS — Z9889 Other specified postprocedural states: Secondary | ICD-10-CM | POA: Diagnosis not present

## 2016-04-22 DIAGNOSIS — N939 Abnormal uterine and vaginal bleeding, unspecified: Secondary | ICD-10-CM | POA: Diagnosis not present

## 2016-04-22 DIAGNOSIS — D899 Disorder involving the immune mechanism, unspecified: Secondary | ICD-10-CM | POA: Diagnosis not present

## 2016-04-22 DIAGNOSIS — Z8639 Personal history of other endocrine, nutritional and metabolic disease: Secondary | ICD-10-CM | POA: Diagnosis not present

## 2016-04-22 DIAGNOSIS — Z5181 Encounter for therapeutic drug level monitoring: Secondary | ICD-10-CM | POA: Diagnosis not present

## 2016-05-15 DIAGNOSIS — Z940 Kidney transplant status: Secondary | ICD-10-CM | POA: Diagnosis not present

## 2016-06-22 DIAGNOSIS — E782 Mixed hyperlipidemia: Secondary | ICD-10-CM | POA: Diagnosis not present

## 2016-06-22 DIAGNOSIS — N183 Chronic kidney disease, stage 3 (moderate): Secondary | ICD-10-CM | POA: Diagnosis not present

## 2016-06-22 DIAGNOSIS — R894 Abnormal immunological findings in specimens from other organs, systems and tissues: Secondary | ICD-10-CM | POA: Diagnosis not present

## 2016-06-22 DIAGNOSIS — Z940 Kidney transplant status: Secondary | ICD-10-CM | POA: Diagnosis not present

## 2016-06-22 DIAGNOSIS — I129 Hypertensive chronic kidney disease with stage 1 through stage 4 chronic kidney disease, or unspecified chronic kidney disease: Secondary | ICD-10-CM | POA: Diagnosis not present

## 2016-06-22 DIAGNOSIS — K210 Gastro-esophageal reflux disease with esophagitis: Secondary | ICD-10-CM | POA: Diagnosis not present

## 2016-06-22 DIAGNOSIS — Z87440 Personal history of urinary (tract) infections: Secondary | ICD-10-CM | POA: Diagnosis not present

## 2016-06-22 DIAGNOSIS — R635 Abnormal weight gain: Secondary | ICD-10-CM | POA: Diagnosis not present

## 2016-06-22 DIAGNOSIS — D631 Anemia in chronic kidney disease: Secondary | ICD-10-CM | POA: Diagnosis not present

## 2016-06-22 DIAGNOSIS — N2581 Secondary hyperparathyroidism of renal origin: Secondary | ICD-10-CM | POA: Diagnosis not present

## 2016-06-30 DIAGNOSIS — N183 Chronic kidney disease, stage 3 (moderate): Secondary | ICD-10-CM | POA: Diagnosis not present

## 2016-07-13 DIAGNOSIS — D251 Intramural leiomyoma of uterus: Secondary | ICD-10-CM | POA: Diagnosis not present

## 2016-07-13 DIAGNOSIS — D500 Iron deficiency anemia secondary to blood loss (chronic): Secondary | ICD-10-CM | POA: Diagnosis not present

## 2016-07-13 DIAGNOSIS — D250 Submucous leiomyoma of uterus: Secondary | ICD-10-CM | POA: Diagnosis not present

## 2016-08-11 DIAGNOSIS — Z23 Encounter for immunization: Secondary | ICD-10-CM | POA: Diagnosis not present

## 2016-08-21 DIAGNOSIS — Z789 Other specified health status: Secondary | ICD-10-CM | POA: Diagnosis not present

## 2016-08-21 DIAGNOSIS — E669 Obesity, unspecified: Secondary | ICD-10-CM | POA: Diagnosis not present

## 2016-08-21 DIAGNOSIS — Z713 Dietary counseling and surveillance: Secondary | ICD-10-CM | POA: Diagnosis not present

## 2016-08-31 DIAGNOSIS — D259 Leiomyoma of uterus, unspecified: Secondary | ICD-10-CM | POA: Diagnosis not present

## 2016-08-31 DIAGNOSIS — D250 Submucous leiomyoma of uterus: Secondary | ICD-10-CM | POA: Diagnosis not present

## 2016-09-24 DIAGNOSIS — I129 Hypertensive chronic kidney disease with stage 1 through stage 4 chronic kidney disease, or unspecified chronic kidney disease: Secondary | ICD-10-CM | POA: Diagnosis not present

## 2016-09-24 DIAGNOSIS — D631 Anemia in chronic kidney disease: Secondary | ICD-10-CM | POA: Diagnosis not present

## 2016-09-24 DIAGNOSIS — Z940 Kidney transplant status: Secondary | ICD-10-CM | POA: Diagnosis not present

## 2016-09-24 DIAGNOSIS — Z87440 Personal history of urinary (tract) infections: Secondary | ICD-10-CM | POA: Diagnosis not present

## 2016-09-24 DIAGNOSIS — E782 Mixed hyperlipidemia: Secondary | ICD-10-CM | POA: Diagnosis not present

## 2016-09-24 DIAGNOSIS — N183 Chronic kidney disease, stage 3 (moderate): Secondary | ICD-10-CM | POA: Diagnosis not present

## 2016-09-24 DIAGNOSIS — K210 Gastro-esophageal reflux disease with esophagitis: Secondary | ICD-10-CM | POA: Diagnosis not present

## 2016-09-24 DIAGNOSIS — N2581 Secondary hyperparathyroidism of renal origin: Secondary | ICD-10-CM | POA: Diagnosis not present

## 2016-09-24 DIAGNOSIS — R894 Abnormal immunological findings in specimens from other organs, systems and tissues: Secondary | ICD-10-CM | POA: Diagnosis not present

## 2016-11-02 DIAGNOSIS — D250 Submucous leiomyoma of uterus: Secondary | ICD-10-CM | POA: Diagnosis not present

## 2016-11-02 DIAGNOSIS — N186 End stage renal disease: Secondary | ICD-10-CM | POA: Diagnosis not present

## 2016-11-02 DIAGNOSIS — N920 Excessive and frequent menstruation with regular cycle: Secondary | ICD-10-CM | POA: Diagnosis not present

## 2016-11-02 DIAGNOSIS — D251 Intramural leiomyoma of uterus: Secondary | ICD-10-CM | POA: Diagnosis not present

## 2016-11-02 DIAGNOSIS — Z992 Dependence on renal dialysis: Secondary | ICD-10-CM | POA: Diagnosis not present

## 2016-11-23 DIAGNOSIS — I10 Essential (primary) hypertension: Secondary | ICD-10-CM | POA: Diagnosis not present

## 2016-11-23 DIAGNOSIS — Z9109 Other allergy status, other than to drugs and biological substances: Secondary | ICD-10-CM | POA: Diagnosis not present

## 2016-11-23 DIAGNOSIS — Z79899 Other long term (current) drug therapy: Secondary | ICD-10-CM | POA: Diagnosis not present

## 2016-11-23 DIAGNOSIS — D250 Submucous leiomyoma of uterus: Secondary | ICD-10-CM | POA: Diagnosis not present

## 2016-11-23 DIAGNOSIS — Z940 Kidney transplant status: Secondary | ICD-10-CM | POA: Diagnosis not present

## 2016-11-24 DIAGNOSIS — I10 Essential (primary) hypertension: Secondary | ICD-10-CM | POA: Diagnosis not present

## 2016-11-24 DIAGNOSIS — Z9109 Other allergy status, other than to drugs and biological substances: Secondary | ICD-10-CM | POA: Diagnosis not present

## 2016-11-24 DIAGNOSIS — D250 Submucous leiomyoma of uterus: Secondary | ICD-10-CM | POA: Diagnosis not present

## 2016-11-24 DIAGNOSIS — Z79899 Other long term (current) drug therapy: Secondary | ICD-10-CM | POA: Diagnosis not present

## 2016-11-24 DIAGNOSIS — Z940 Kidney transplant status: Secondary | ICD-10-CM | POA: Diagnosis not present

## 2016-11-27 DIAGNOSIS — Z940 Kidney transplant status: Secondary | ICD-10-CM | POA: Diagnosis not present

## 2016-11-27 DIAGNOSIS — Z5181 Encounter for therapeutic drug level monitoring: Secondary | ICD-10-CM | POA: Diagnosis not present

## 2016-11-27 DIAGNOSIS — Z79899 Other long term (current) drug therapy: Secondary | ICD-10-CM | POA: Diagnosis not present

## 2016-12-07 DIAGNOSIS — Z79899 Other long term (current) drug therapy: Secondary | ICD-10-CM | POA: Diagnosis not present

## 2016-12-07 DIAGNOSIS — Z940 Kidney transplant status: Secondary | ICD-10-CM | POA: Diagnosis not present

## 2016-12-07 DIAGNOSIS — Z5181 Encounter for therapeutic drug level monitoring: Secondary | ICD-10-CM | POA: Diagnosis not present

## 2016-12-16 DIAGNOSIS — D250 Submucous leiomyoma of uterus: Secondary | ICD-10-CM | POA: Diagnosis not present

## 2016-12-16 DIAGNOSIS — Z940 Kidney transplant status: Secondary | ICD-10-CM | POA: Diagnosis not present

## 2016-12-22 DIAGNOSIS — R635 Abnormal weight gain: Secondary | ICD-10-CM | POA: Diagnosis not present

## 2016-12-22 DIAGNOSIS — K210 Gastro-esophageal reflux disease with esophagitis: Secondary | ICD-10-CM | POA: Diagnosis not present

## 2016-12-22 DIAGNOSIS — Z940 Kidney transplant status: Secondary | ICD-10-CM | POA: Diagnosis not present

## 2016-12-22 DIAGNOSIS — N183 Chronic kidney disease, stage 3 (moderate): Secondary | ICD-10-CM | POA: Diagnosis not present

## 2016-12-22 DIAGNOSIS — I129 Hypertensive chronic kidney disease with stage 1 through stage 4 chronic kidney disease, or unspecified chronic kidney disease: Secondary | ICD-10-CM | POA: Diagnosis not present

## 2016-12-22 DIAGNOSIS — D631 Anemia in chronic kidney disease: Secondary | ICD-10-CM | POA: Diagnosis not present

## 2016-12-22 DIAGNOSIS — R894 Abnormal immunological findings in specimens from other organs, systems and tissues: Secondary | ICD-10-CM | POA: Diagnosis not present

## 2016-12-22 DIAGNOSIS — E782 Mixed hyperlipidemia: Secondary | ICD-10-CM | POA: Diagnosis not present

## 2016-12-22 DIAGNOSIS — Z87440 Personal history of urinary (tract) infections: Secondary | ICD-10-CM | POA: Diagnosis not present

## 2016-12-22 DIAGNOSIS — N2581 Secondary hyperparathyroidism of renal origin: Secondary | ICD-10-CM | POA: Diagnosis not present

## 2016-12-22 DIAGNOSIS — E669 Obesity, unspecified: Secondary | ICD-10-CM | POA: Diagnosis not present

## 2017-03-29 DIAGNOSIS — K210 Gastro-esophageal reflux disease with esophagitis: Secondary | ICD-10-CM | POA: Diagnosis not present

## 2017-03-29 DIAGNOSIS — E669 Obesity, unspecified: Secondary | ICD-10-CM | POA: Diagnosis not present

## 2017-03-29 DIAGNOSIS — Z940 Kidney transplant status: Secondary | ICD-10-CM | POA: Diagnosis not present

## 2017-03-29 DIAGNOSIS — I129 Hypertensive chronic kidney disease with stage 1 through stage 4 chronic kidney disease, or unspecified chronic kidney disease: Secondary | ICD-10-CM | POA: Diagnosis not present

## 2017-03-29 DIAGNOSIS — N2581 Secondary hyperparathyroidism of renal origin: Secondary | ICD-10-CM | POA: Diagnosis not present

## 2017-03-29 DIAGNOSIS — D631 Anemia in chronic kidney disease: Secondary | ICD-10-CM | POA: Diagnosis not present

## 2017-03-29 DIAGNOSIS — Z87440 Personal history of urinary (tract) infections: Secondary | ICD-10-CM | POA: Diagnosis not present

## 2017-03-29 DIAGNOSIS — E782 Mixed hyperlipidemia: Secondary | ICD-10-CM | POA: Diagnosis not present

## 2017-03-29 DIAGNOSIS — N183 Chronic kidney disease, stage 3 (moderate): Secondary | ICD-10-CM | POA: Diagnosis not present

## 2017-03-29 DIAGNOSIS — R894 Abnormal immunological findings in specimens from other organs, systems and tissues: Secondary | ICD-10-CM | POA: Diagnosis not present

## 2017-06-04 DIAGNOSIS — Z940 Kidney transplant status: Secondary | ICD-10-CM | POA: Diagnosis not present

## 2017-06-04 DIAGNOSIS — Z09 Encounter for follow-up examination after completed treatment for conditions other than malignant neoplasm: Secondary | ICD-10-CM | POA: Diagnosis not present

## 2017-06-04 DIAGNOSIS — Z87440 Personal history of urinary (tract) infections: Secondary | ICD-10-CM | POA: Diagnosis not present

## 2017-06-04 DIAGNOSIS — Z23 Encounter for immunization: Secondary | ICD-10-CM | POA: Diagnosis not present

## 2017-06-08 DIAGNOSIS — I129 Hypertensive chronic kidney disease with stage 1 through stage 4 chronic kidney disease, or unspecified chronic kidney disease: Secondary | ICD-10-CM | POA: Diagnosis not present

## 2017-06-08 DIAGNOSIS — Z940 Kidney transplant status: Secondary | ICD-10-CM | POA: Diagnosis not present

## 2017-06-08 DIAGNOSIS — K210 Gastro-esophageal reflux disease with esophagitis: Secondary | ICD-10-CM | POA: Diagnosis not present

## 2017-06-08 DIAGNOSIS — Z87440 Personal history of urinary (tract) infections: Secondary | ICD-10-CM | POA: Diagnosis not present

## 2017-06-08 DIAGNOSIS — R894 Abnormal immunological findings in specimens from other organs, systems and tissues: Secondary | ICD-10-CM | POA: Diagnosis not present

## 2017-06-08 DIAGNOSIS — N183 Chronic kidney disease, stage 3 (moderate): Secondary | ICD-10-CM | POA: Diagnosis not present

## 2017-06-08 DIAGNOSIS — D631 Anemia in chronic kidney disease: Secondary | ICD-10-CM | POA: Diagnosis not present

## 2017-06-08 DIAGNOSIS — N2581 Secondary hyperparathyroidism of renal origin: Secondary | ICD-10-CM | POA: Diagnosis not present

## 2017-06-08 DIAGNOSIS — E669 Obesity, unspecified: Secondary | ICD-10-CM | POA: Diagnosis not present

## 2017-06-08 DIAGNOSIS — E782 Mixed hyperlipidemia: Secondary | ICD-10-CM | POA: Diagnosis not present

## 2017-09-24 DIAGNOSIS — Z87440 Personal history of urinary (tract) infections: Secondary | ICD-10-CM | POA: Diagnosis not present

## 2017-09-24 DIAGNOSIS — I129 Hypertensive chronic kidney disease with stage 1 through stage 4 chronic kidney disease, or unspecified chronic kidney disease: Secondary | ICD-10-CM | POA: Diagnosis not present

## 2017-09-24 DIAGNOSIS — E782 Mixed hyperlipidemia: Secondary | ICD-10-CM | POA: Diagnosis not present

## 2017-09-24 DIAGNOSIS — N183 Chronic kidney disease, stage 3 (moderate): Secondary | ICD-10-CM | POA: Diagnosis not present

## 2017-09-24 DIAGNOSIS — N2581 Secondary hyperparathyroidism of renal origin: Secondary | ICD-10-CM | POA: Diagnosis not present

## 2017-09-24 DIAGNOSIS — E669 Obesity, unspecified: Secondary | ICD-10-CM | POA: Diagnosis not present

## 2017-09-24 DIAGNOSIS — K210 Gastro-esophageal reflux disease with esophagitis: Secondary | ICD-10-CM | POA: Diagnosis not present

## 2017-09-24 DIAGNOSIS — D631 Anemia in chronic kidney disease: Secondary | ICD-10-CM | POA: Diagnosis not present

## 2017-09-24 DIAGNOSIS — Z940 Kidney transplant status: Secondary | ICD-10-CM | POA: Diagnosis not present

## 2017-09-24 DIAGNOSIS — R894 Abnormal immunological findings in specimens from other organs, systems and tissues: Secondary | ICD-10-CM | POA: Diagnosis not present

## 2017-12-21 DIAGNOSIS — E782 Mixed hyperlipidemia: Secondary | ICD-10-CM | POA: Diagnosis not present

## 2017-12-21 DIAGNOSIS — Z87440 Personal history of urinary (tract) infections: Secondary | ICD-10-CM | POA: Diagnosis not present

## 2017-12-21 DIAGNOSIS — D631 Anemia in chronic kidney disease: Secondary | ICD-10-CM | POA: Diagnosis not present

## 2017-12-21 DIAGNOSIS — K210 Gastro-esophageal reflux disease with esophagitis: Secondary | ICD-10-CM | POA: Diagnosis not present

## 2017-12-21 DIAGNOSIS — E669 Obesity, unspecified: Secondary | ICD-10-CM | POA: Diagnosis not present

## 2017-12-21 DIAGNOSIS — N183 Chronic kidney disease, stage 3 (moderate): Secondary | ICD-10-CM | POA: Diagnosis not present

## 2017-12-21 DIAGNOSIS — I129 Hypertensive chronic kidney disease with stage 1 through stage 4 chronic kidney disease, or unspecified chronic kidney disease: Secondary | ICD-10-CM | POA: Diagnosis not present

## 2017-12-21 DIAGNOSIS — Z940 Kidney transplant status: Secondary | ICD-10-CM | POA: Diagnosis not present

## 2017-12-21 DIAGNOSIS — Z0000 Encounter for general adult medical examination without abnormal findings: Secondary | ICD-10-CM | POA: Diagnosis not present

## 2017-12-21 DIAGNOSIS — R894 Abnormal immunological findings in specimens from other organs, systems and tissues: Secondary | ICD-10-CM | POA: Diagnosis not present

## 2017-12-21 DIAGNOSIS — N2581 Secondary hyperparathyroidism of renal origin: Secondary | ICD-10-CM | POA: Diagnosis not present

## 2018-01-06 DIAGNOSIS — D219 Benign neoplasm of connective and other soft tissue, unspecified: Secondary | ICD-10-CM | POA: Insufficient documentation

## 2018-01-06 DIAGNOSIS — Z01419 Encounter for gynecological examination (general) (routine) without abnormal findings: Secondary | ICD-10-CM | POA: Diagnosis not present

## 2018-01-06 DIAGNOSIS — Z1151 Encounter for screening for human papillomavirus (HPV): Secondary | ICD-10-CM | POA: Diagnosis not present

## 2018-01-06 DIAGNOSIS — D849 Immunodeficiency, unspecified: Secondary | ICD-10-CM | POA: Diagnosis not present

## 2018-01-06 DIAGNOSIS — D899 Disorder involving the immune mechanism, unspecified: Secondary | ICD-10-CM | POA: Diagnosis not present

## 2018-01-07 DIAGNOSIS — I10 Essential (primary) hypertension: Secondary | ICD-10-CM | POA: Diagnosis not present

## 2018-01-07 DIAGNOSIS — D649 Anemia, unspecified: Secondary | ICD-10-CM | POA: Diagnosis not present

## 2018-01-11 DIAGNOSIS — Z0000 Encounter for general adult medical examination without abnormal findings: Secondary | ICD-10-CM | POA: Diagnosis not present

## 2018-01-11 DIAGNOSIS — Z6833 Body mass index (BMI) 33.0-33.9, adult: Secondary | ICD-10-CM | POA: Diagnosis not present

## 2018-01-11 DIAGNOSIS — Z01419 Encounter for gynecological examination (general) (routine) without abnormal findings: Secondary | ICD-10-CM | POA: Diagnosis not present

## 2018-01-11 DIAGNOSIS — B373 Candidiasis of vulva and vagina: Secondary | ICD-10-CM | POA: Diagnosis not present

## 2018-01-11 DIAGNOSIS — D509 Iron deficiency anemia, unspecified: Secondary | ICD-10-CM | POA: Diagnosis not present

## 2018-01-11 DIAGNOSIS — D259 Leiomyoma of uterus, unspecified: Secondary | ICD-10-CM | POA: Diagnosis not present

## 2018-01-11 DIAGNOSIS — N184 Chronic kidney disease, stage 4 (severe): Secondary | ICD-10-CM | POA: Diagnosis not present

## 2018-03-11 DIAGNOSIS — Z5181 Encounter for therapeutic drug level monitoring: Secondary | ICD-10-CM | POA: Diagnosis not present

## 2018-03-11 DIAGNOSIS — Z79899 Other long term (current) drug therapy: Secondary | ICD-10-CM | POA: Diagnosis not present

## 2018-03-11 DIAGNOSIS — B259 Cytomegaloviral disease, unspecified: Secondary | ICD-10-CM | POA: Diagnosis not present

## 2018-03-11 DIAGNOSIS — Z940 Kidney transplant status: Secondary | ICD-10-CM | POA: Diagnosis not present

## 2018-03-21 DIAGNOSIS — K210 Gastro-esophageal reflux disease with esophagitis: Secondary | ICD-10-CM | POA: Diagnosis not present

## 2018-03-21 DIAGNOSIS — E669 Obesity, unspecified: Secondary | ICD-10-CM | POA: Diagnosis not present

## 2018-03-21 DIAGNOSIS — D631 Anemia in chronic kidney disease: Secondary | ICD-10-CM | POA: Diagnosis not present

## 2018-03-21 DIAGNOSIS — I129 Hypertensive chronic kidney disease with stage 1 through stage 4 chronic kidney disease, or unspecified chronic kidney disease: Secondary | ICD-10-CM | POA: Diagnosis not present

## 2018-03-21 DIAGNOSIS — N183 Chronic kidney disease, stage 3 (moderate): Secondary | ICD-10-CM | POA: Diagnosis not present

## 2018-03-21 DIAGNOSIS — Z0000 Encounter for general adult medical examination without abnormal findings: Secondary | ICD-10-CM | POA: Diagnosis not present

## 2018-03-21 DIAGNOSIS — N2581 Secondary hyperparathyroidism of renal origin: Secondary | ICD-10-CM | POA: Diagnosis not present

## 2018-03-21 DIAGNOSIS — Z87440 Personal history of urinary (tract) infections: Secondary | ICD-10-CM | POA: Diagnosis not present

## 2018-03-21 DIAGNOSIS — E782 Mixed hyperlipidemia: Secondary | ICD-10-CM | POA: Diagnosis not present

## 2018-03-21 DIAGNOSIS — Z940 Kidney transplant status: Secondary | ICD-10-CM | POA: Diagnosis not present

## 2018-03-29 DIAGNOSIS — Z0000 Encounter for general adult medical examination without abnormal findings: Secondary | ICD-10-CM | POA: Diagnosis not present

## 2018-03-29 DIAGNOSIS — N184 Chronic kidney disease, stage 4 (severe): Secondary | ICD-10-CM | POA: Diagnosis not present

## 2018-03-29 DIAGNOSIS — Z6833 Body mass index (BMI) 33.0-33.9, adult: Secondary | ICD-10-CM | POA: Diagnosis not present

## 2018-03-29 DIAGNOSIS — D509 Iron deficiency anemia, unspecified: Secondary | ICD-10-CM | POA: Diagnosis not present

## 2018-03-29 DIAGNOSIS — B373 Candidiasis of vulva and vagina: Secondary | ICD-10-CM | POA: Diagnosis not present

## 2018-03-29 DIAGNOSIS — D259 Leiomyoma of uterus, unspecified: Secondary | ICD-10-CM | POA: Diagnosis not present

## 2018-03-29 DIAGNOSIS — L239 Allergic contact dermatitis, unspecified cause: Secondary | ICD-10-CM | POA: Diagnosis not present

## 2018-05-13 DIAGNOSIS — N898 Other specified noninflammatory disorders of vagina: Secondary | ICD-10-CM | POA: Diagnosis not present

## 2018-05-13 DIAGNOSIS — Z6833 Body mass index (BMI) 33.0-33.9, adult: Secondary | ICD-10-CM | POA: Diagnosis not present

## 2018-05-23 DIAGNOSIS — D250 Submucous leiomyoma of uterus: Secondary | ICD-10-CM | POA: Diagnosis not present

## 2018-05-23 DIAGNOSIS — N939 Abnormal uterine and vaginal bleeding, unspecified: Secondary | ICD-10-CM | POA: Diagnosis not present

## 2018-06-10 DIAGNOSIS — N760 Acute vaginitis: Secondary | ICD-10-CM | POA: Diagnosis not present

## 2018-06-27 DIAGNOSIS — Z79899 Other long term (current) drug therapy: Secondary | ICD-10-CM | POA: Diagnosis not present

## 2018-06-27 DIAGNOSIS — Z940 Kidney transplant status: Secondary | ICD-10-CM | POA: Diagnosis not present

## 2018-06-27 DIAGNOSIS — I10 Essential (primary) hypertension: Secondary | ICD-10-CM | POA: Diagnosis not present

## 2018-07-05 DIAGNOSIS — K210 Gastro-esophageal reflux disease with esophagitis: Secondary | ICD-10-CM | POA: Diagnosis not present

## 2018-07-05 DIAGNOSIS — Z940 Kidney transplant status: Secondary | ICD-10-CM | POA: Diagnosis not present

## 2018-07-05 DIAGNOSIS — E782 Mixed hyperlipidemia: Secondary | ICD-10-CM | POA: Diagnosis not present

## 2018-07-05 DIAGNOSIS — I129 Hypertensive chronic kidney disease with stage 1 through stage 4 chronic kidney disease, or unspecified chronic kidney disease: Secondary | ICD-10-CM | POA: Diagnosis not present

## 2018-07-05 DIAGNOSIS — D631 Anemia in chronic kidney disease: Secondary | ICD-10-CM | POA: Diagnosis not present

## 2018-07-05 DIAGNOSIS — N183 Chronic kidney disease, stage 3 (moderate): Secondary | ICD-10-CM | POA: Diagnosis not present

## 2018-07-05 DIAGNOSIS — Z87440 Personal history of urinary (tract) infections: Secondary | ICD-10-CM | POA: Diagnosis not present

## 2018-07-05 DIAGNOSIS — E669 Obesity, unspecified: Secondary | ICD-10-CM | POA: Diagnosis not present

## 2018-07-05 DIAGNOSIS — N2581 Secondary hyperparathyroidism of renal origin: Secondary | ICD-10-CM | POA: Diagnosis not present

## 2018-07-05 DIAGNOSIS — Z0000 Encounter for general adult medical examination without abnormal findings: Secondary | ICD-10-CM | POA: Diagnosis not present

## 2018-07-05 DIAGNOSIS — N390 Urinary tract infection, site not specified: Secondary | ICD-10-CM | POA: Diagnosis not present

## 2018-07-07 DIAGNOSIS — B349 Viral infection, unspecified: Secondary | ICD-10-CM | POA: Diagnosis not present

## 2018-07-07 DIAGNOSIS — Z79899 Other long term (current) drug therapy: Secondary | ICD-10-CM | POA: Diagnosis not present

## 2018-07-07 DIAGNOSIS — Z7952 Long term (current) use of systemic steroids: Secondary | ICD-10-CM | POA: Diagnosis not present

## 2018-07-07 DIAGNOSIS — R509 Fever, unspecified: Secondary | ICD-10-CM | POA: Diagnosis not present

## 2018-07-07 DIAGNOSIS — N186 End stage renal disease: Secondary | ICD-10-CM | POA: Diagnosis not present

## 2018-07-07 DIAGNOSIS — N2581 Secondary hyperparathyroidism of renal origin: Secondary | ICD-10-CM | POA: Diagnosis not present

## 2018-07-07 DIAGNOSIS — Z940 Kidney transplant status: Secondary | ICD-10-CM | POA: Diagnosis not present

## 2018-07-07 DIAGNOSIS — I120 Hypertensive chronic kidney disease with stage 5 chronic kidney disease or end stage renal disease: Secondary | ICD-10-CM | POA: Diagnosis not present

## 2018-07-07 DIAGNOSIS — R1110 Vomiting, unspecified: Secondary | ICD-10-CM | POA: Diagnosis not present

## 2018-08-09 DIAGNOSIS — E669 Obesity, unspecified: Secondary | ICD-10-CM | POA: Diagnosis not present

## 2018-08-09 DIAGNOSIS — J069 Acute upper respiratory infection, unspecified: Secondary | ICD-10-CM | POA: Diagnosis not present

## 2018-08-09 DIAGNOSIS — R012 Other cardiac sounds: Secondary | ICD-10-CM | POA: Diagnosis not present

## 2018-10-25 DIAGNOSIS — K210 Gastro-esophageal reflux disease with esophagitis: Secondary | ICD-10-CM | POA: Diagnosis not present

## 2018-10-25 DIAGNOSIS — Z87440 Personal history of urinary (tract) infections: Secondary | ICD-10-CM | POA: Diagnosis not present

## 2018-10-25 DIAGNOSIS — E782 Mixed hyperlipidemia: Secondary | ICD-10-CM | POA: Diagnosis not present

## 2018-10-25 DIAGNOSIS — D631 Anemia in chronic kidney disease: Secondary | ICD-10-CM | POA: Diagnosis not present

## 2018-10-25 DIAGNOSIS — N183 Chronic kidney disease, stage 3 (moderate): Secondary | ICD-10-CM | POA: Diagnosis not present

## 2018-10-25 DIAGNOSIS — E669 Obesity, unspecified: Secondary | ICD-10-CM | POA: Diagnosis not present

## 2018-10-25 DIAGNOSIS — Z940 Kidney transplant status: Secondary | ICD-10-CM | POA: Diagnosis not present

## 2018-10-25 DIAGNOSIS — Z0000 Encounter for general adult medical examination without abnormal findings: Secondary | ICD-10-CM | POA: Diagnosis not present

## 2018-10-25 DIAGNOSIS — I129 Hypertensive chronic kidney disease with stage 1 through stage 4 chronic kidney disease, or unspecified chronic kidney disease: Secondary | ICD-10-CM | POA: Diagnosis not present

## 2018-10-25 DIAGNOSIS — N2581 Secondary hyperparathyroidism of renal origin: Secondary | ICD-10-CM | POA: Diagnosis not present

## 2018-11-08 DIAGNOSIS — Z940 Kidney transplant status: Secondary | ICD-10-CM | POA: Diagnosis not present

## 2019-01-17 DIAGNOSIS — Z940 Kidney transplant status: Secondary | ICD-10-CM | POA: Diagnosis not present

## 2019-01-17 DIAGNOSIS — D631 Anemia in chronic kidney disease: Secondary | ICD-10-CM | POA: Diagnosis not present

## 2019-01-17 DIAGNOSIS — E669 Obesity, unspecified: Secondary | ICD-10-CM | POA: Diagnosis not present

## 2019-01-17 DIAGNOSIS — N183 Chronic kidney disease, stage 3 (moderate): Secondary | ICD-10-CM | POA: Diagnosis not present

## 2019-01-17 DIAGNOSIS — K210 Gastro-esophageal reflux disease with esophagitis: Secondary | ICD-10-CM | POA: Diagnosis not present

## 2019-01-17 DIAGNOSIS — I129 Hypertensive chronic kidney disease with stage 1 through stage 4 chronic kidney disease, or unspecified chronic kidney disease: Secondary | ICD-10-CM | POA: Diagnosis not present

## 2019-01-17 DIAGNOSIS — Z0000 Encounter for general adult medical examination without abnormal findings: Secondary | ICD-10-CM | POA: Diagnosis not present

## 2019-01-17 DIAGNOSIS — E782 Mixed hyperlipidemia: Secondary | ICD-10-CM | POA: Diagnosis not present

## 2019-01-17 DIAGNOSIS — N2581 Secondary hyperparathyroidism of renal origin: Secondary | ICD-10-CM | POA: Diagnosis not present

## 2019-01-17 DIAGNOSIS — Z87440 Personal history of urinary (tract) infections: Secondary | ICD-10-CM | POA: Diagnosis not present

## 2019-01-30 DIAGNOSIS — N390 Urinary tract infection, site not specified: Secondary | ICD-10-CM | POA: Diagnosis not present

## 2019-02-09 DIAGNOSIS — Z940 Kidney transplant status: Secondary | ICD-10-CM | POA: Diagnosis not present

## 2019-02-09 DIAGNOSIS — N183 Chronic kidney disease, stage 3 (moderate): Secondary | ICD-10-CM | POA: Diagnosis not present

## 2019-02-09 DIAGNOSIS — N189 Chronic kidney disease, unspecified: Secondary | ICD-10-CM | POA: Diagnosis not present

## 2019-02-09 DIAGNOSIS — I129 Hypertensive chronic kidney disease with stage 1 through stage 4 chronic kidney disease, or unspecified chronic kidney disease: Secondary | ICD-10-CM | POA: Diagnosis not present

## 2019-03-31 DIAGNOSIS — Z940 Kidney transplant status: Secondary | ICD-10-CM | POA: Diagnosis not present

## 2019-03-31 DIAGNOSIS — I129 Hypertensive chronic kidney disease with stage 1 through stage 4 chronic kidney disease, or unspecified chronic kidney disease: Secondary | ICD-10-CM | POA: Diagnosis not present

## 2019-04-11 DIAGNOSIS — Z940 Kidney transplant status: Secondary | ICD-10-CM | POA: Diagnosis not present

## 2019-04-19 DIAGNOSIS — Z1159 Encounter for screening for other viral diseases: Secondary | ICD-10-CM | POA: Diagnosis not present

## 2019-04-19 DIAGNOSIS — K210 Gastro-esophageal reflux disease with esophagitis: Secondary | ICD-10-CM | POA: Diagnosis not present

## 2019-04-19 DIAGNOSIS — N183 Chronic kidney disease, stage 3 (moderate): Secondary | ICD-10-CM | POA: Diagnosis not present

## 2019-04-19 DIAGNOSIS — D631 Anemia in chronic kidney disease: Secondary | ICD-10-CM | POA: Diagnosis not present

## 2019-04-19 DIAGNOSIS — E669 Obesity, unspecified: Secondary | ICD-10-CM | POA: Diagnosis not present

## 2019-04-19 DIAGNOSIS — I129 Hypertensive chronic kidney disease with stage 1 through stage 4 chronic kidney disease, or unspecified chronic kidney disease: Secondary | ICD-10-CM | POA: Diagnosis not present

## 2019-04-19 DIAGNOSIS — Z0000 Encounter for general adult medical examination without abnormal findings: Secondary | ICD-10-CM | POA: Diagnosis not present

## 2019-04-19 DIAGNOSIS — N2581 Secondary hyperparathyroidism of renal origin: Secondary | ICD-10-CM | POA: Diagnosis not present

## 2019-04-19 DIAGNOSIS — Z940 Kidney transplant status: Secondary | ICD-10-CM | POA: Diagnosis not present

## 2019-04-19 DIAGNOSIS — Z87440 Personal history of urinary (tract) infections: Secondary | ICD-10-CM | POA: Diagnosis not present

## 2019-04-19 DIAGNOSIS — E782 Mixed hyperlipidemia: Secondary | ICD-10-CM | POA: Diagnosis not present

## 2019-05-11 DIAGNOSIS — D509 Iron deficiency anemia, unspecified: Secondary | ICD-10-CM | POA: Diagnosis not present

## 2019-05-11 DIAGNOSIS — E669 Obesity, unspecified: Secondary | ICD-10-CM | POA: Diagnosis not present

## 2019-05-11 DIAGNOSIS — N184 Chronic kidney disease, stage 4 (severe): Secondary | ICD-10-CM | POA: Diagnosis not present

## 2019-05-17 DIAGNOSIS — D631 Anemia in chronic kidney disease: Secondary | ICD-10-CM | POA: Diagnosis not present

## 2019-05-17 DIAGNOSIS — Z0001 Encounter for general adult medical examination with abnormal findings: Secondary | ICD-10-CM | POA: Diagnosis not present

## 2019-05-17 DIAGNOSIS — E876 Hypokalemia: Secondary | ICD-10-CM | POA: Diagnosis not present

## 2019-05-17 DIAGNOSIS — N183 Chronic kidney disease, stage 3 (moderate): Secondary | ICD-10-CM | POA: Diagnosis not present

## 2019-06-22 DIAGNOSIS — Z79899 Other long term (current) drug therapy: Secondary | ICD-10-CM | POA: Diagnosis not present

## 2019-06-22 DIAGNOSIS — Z5181 Encounter for therapeutic drug level monitoring: Secondary | ICD-10-CM | POA: Diagnosis not present

## 2019-06-22 DIAGNOSIS — Z940 Kidney transplant status: Secondary | ICD-10-CM | POA: Diagnosis not present

## 2019-06-23 DIAGNOSIS — D899 Disorder involving the immune mechanism, unspecified: Secondary | ICD-10-CM | POA: Diagnosis not present

## 2019-06-23 DIAGNOSIS — Z940 Kidney transplant status: Secondary | ICD-10-CM | POA: Diagnosis not present

## 2019-07-07 DIAGNOSIS — I129 Hypertensive chronic kidney disease with stage 1 through stage 4 chronic kidney disease, or unspecified chronic kidney disease: Secondary | ICD-10-CM | POA: Diagnosis not present

## 2019-07-07 DIAGNOSIS — Z940 Kidney transplant status: Secondary | ICD-10-CM | POA: Diagnosis not present

## 2019-07-24 DIAGNOSIS — N183 Chronic kidney disease, stage 3 unspecified: Secondary | ICD-10-CM | POA: Diagnosis not present

## 2019-08-02 DIAGNOSIS — Z87440 Personal history of urinary (tract) infections: Secondary | ICD-10-CM | POA: Diagnosis not present

## 2019-08-02 DIAGNOSIS — Z23 Encounter for immunization: Secondary | ICD-10-CM | POA: Diagnosis not present

## 2019-08-02 DIAGNOSIS — N183 Chronic kidney disease, stage 3 unspecified: Secondary | ICD-10-CM | POA: Diagnosis not present

## 2019-08-02 DIAGNOSIS — D631 Anemia in chronic kidney disease: Secondary | ICD-10-CM | POA: Diagnosis not present

## 2019-08-02 DIAGNOSIS — Z0000 Encounter for general adult medical examination without abnormal findings: Secondary | ICD-10-CM | POA: Diagnosis not present

## 2019-08-02 DIAGNOSIS — I129 Hypertensive chronic kidney disease with stage 1 through stage 4 chronic kidney disease, or unspecified chronic kidney disease: Secondary | ICD-10-CM | POA: Diagnosis not present

## 2019-08-02 DIAGNOSIS — N2581 Secondary hyperparathyroidism of renal origin: Secondary | ICD-10-CM | POA: Diagnosis not present

## 2019-08-02 DIAGNOSIS — E669 Obesity, unspecified: Secondary | ICD-10-CM | POA: Diagnosis not present

## 2019-08-02 DIAGNOSIS — Z940 Kidney transplant status: Secondary | ICD-10-CM | POA: Diagnosis not present

## 2019-08-02 DIAGNOSIS — E782 Mixed hyperlipidemia: Secondary | ICD-10-CM | POA: Diagnosis not present

## 2019-09-05 DIAGNOSIS — Z940 Kidney transplant status: Secondary | ICD-10-CM | POA: Diagnosis not present

## 2019-09-20 DIAGNOSIS — D631 Anemia in chronic kidney disease: Secondary | ICD-10-CM | POA: Diagnosis not present

## 2019-09-20 DIAGNOSIS — E782 Mixed hyperlipidemia: Secondary | ICD-10-CM | POA: Diagnosis not present

## 2019-09-20 DIAGNOSIS — N2581 Secondary hyperparathyroidism of renal origin: Secondary | ICD-10-CM | POA: Diagnosis not present

## 2019-09-20 DIAGNOSIS — Z0000 Encounter for general adult medical examination without abnormal findings: Secondary | ICD-10-CM | POA: Diagnosis not present

## 2019-09-20 DIAGNOSIS — N189 Chronic kidney disease, unspecified: Secondary | ICD-10-CM | POA: Diagnosis not present

## 2019-09-20 DIAGNOSIS — I129 Hypertensive chronic kidney disease with stage 1 through stage 4 chronic kidney disease, or unspecified chronic kidney disease: Secondary | ICD-10-CM | POA: Diagnosis not present

## 2019-09-20 DIAGNOSIS — Z87440 Personal history of urinary (tract) infections: Secondary | ICD-10-CM | POA: Diagnosis not present

## 2019-09-20 DIAGNOSIS — E669 Obesity, unspecified: Secondary | ICD-10-CM | POA: Diagnosis not present

## 2019-09-20 DIAGNOSIS — N183 Chronic kidney disease, stage 3 unspecified: Secondary | ICD-10-CM | POA: Diagnosis not present

## 2019-09-20 DIAGNOSIS — Z940 Kidney transplant status: Secondary | ICD-10-CM | POA: Diagnosis not present

## 2019-11-15 DIAGNOSIS — E669 Obesity, unspecified: Secondary | ICD-10-CM | POA: Diagnosis not present

## 2019-11-15 DIAGNOSIS — Z87440 Personal history of urinary (tract) infections: Secondary | ICD-10-CM | POA: Diagnosis not present

## 2019-11-15 DIAGNOSIS — Z0000 Encounter for general adult medical examination without abnormal findings: Secondary | ICD-10-CM | POA: Diagnosis not present

## 2019-11-15 DIAGNOSIS — D631 Anemia in chronic kidney disease: Secondary | ICD-10-CM | POA: Diagnosis not present

## 2019-11-15 DIAGNOSIS — E782 Mixed hyperlipidemia: Secondary | ICD-10-CM | POA: Diagnosis not present

## 2019-11-15 DIAGNOSIS — Z940 Kidney transplant status: Secondary | ICD-10-CM | POA: Diagnosis not present

## 2019-11-15 DIAGNOSIS — N183 Chronic kidney disease, stage 3 unspecified: Secondary | ICD-10-CM | POA: Diagnosis not present

## 2019-11-15 DIAGNOSIS — N189 Chronic kidney disease, unspecified: Secondary | ICD-10-CM | POA: Diagnosis not present

## 2019-11-15 DIAGNOSIS — N2581 Secondary hyperparathyroidism of renal origin: Secondary | ICD-10-CM | POA: Diagnosis not present

## 2019-11-15 DIAGNOSIS — I129 Hypertensive chronic kidney disease with stage 1 through stage 4 chronic kidney disease, or unspecified chronic kidney disease: Secondary | ICD-10-CM | POA: Diagnosis not present

## 2019-12-11 DIAGNOSIS — I129 Hypertensive chronic kidney disease with stage 1 through stage 4 chronic kidney disease, or unspecified chronic kidney disease: Secondary | ICD-10-CM | POA: Diagnosis not present

## 2019-12-11 DIAGNOSIS — Z940 Kidney transplant status: Secondary | ICD-10-CM | POA: Diagnosis not present

## 2019-12-11 DIAGNOSIS — D631 Anemia in chronic kidney disease: Secondary | ICD-10-CM | POA: Diagnosis not present

## 2019-12-11 DIAGNOSIS — N183 Chronic kidney disease, stage 3 unspecified: Secondary | ICD-10-CM | POA: Diagnosis not present

## 2020-01-03 DIAGNOSIS — I129 Hypertensive chronic kidney disease with stage 1 through stage 4 chronic kidney disease, or unspecified chronic kidney disease: Secondary | ICD-10-CM | POA: Diagnosis not present

## 2020-01-03 DIAGNOSIS — N183 Chronic kidney disease, stage 3 unspecified: Secondary | ICD-10-CM | POA: Diagnosis not present

## 2020-02-06 DIAGNOSIS — L03211 Cellulitis of face: Secondary | ICD-10-CM | POA: Diagnosis not present

## 2020-02-07 DIAGNOSIS — I129 Hypertensive chronic kidney disease with stage 1 through stage 4 chronic kidney disease, or unspecified chronic kidney disease: Secondary | ICD-10-CM | POA: Diagnosis not present

## 2020-02-07 DIAGNOSIS — D631 Anemia in chronic kidney disease: Secondary | ICD-10-CM | POA: Diagnosis not present

## 2020-02-07 DIAGNOSIS — Z940 Kidney transplant status: Secondary | ICD-10-CM | POA: Diagnosis not present

## 2020-02-07 DIAGNOSIS — N183 Chronic kidney disease, stage 3 unspecified: Secondary | ICD-10-CM | POA: Diagnosis not present

## 2020-02-23 DIAGNOSIS — Z5181 Encounter for therapeutic drug level monitoring: Secondary | ICD-10-CM | POA: Diagnosis not present

## 2020-02-23 DIAGNOSIS — Z940 Kidney transplant status: Secondary | ICD-10-CM | POA: Diagnosis not present

## 2020-02-28 DIAGNOSIS — Z940 Kidney transplant status: Secondary | ICD-10-CM | POA: Diagnosis not present

## 2020-02-28 DIAGNOSIS — N183 Chronic kidney disease, stage 3 unspecified: Secondary | ICD-10-CM | POA: Diagnosis not present

## 2020-03-25 DIAGNOSIS — L03211 Cellulitis of face: Secondary | ICD-10-CM | POA: Diagnosis not present

## 2020-04-19 DIAGNOSIS — Z940 Kidney transplant status: Secondary | ICD-10-CM | POA: Diagnosis not present

## 2020-04-26 DIAGNOSIS — Z940 Kidney transplant status: Secondary | ICD-10-CM | POA: Diagnosis not present

## 2020-05-10 DIAGNOSIS — Z940 Kidney transplant status: Secondary | ICD-10-CM | POA: Diagnosis not present

## 2020-05-22 DIAGNOSIS — I129 Hypertensive chronic kidney disease with stage 1 through stage 4 chronic kidney disease, or unspecified chronic kidney disease: Secondary | ICD-10-CM | POA: Diagnosis not present

## 2020-05-22 DIAGNOSIS — Z940 Kidney transplant status: Secondary | ICD-10-CM | POA: Diagnosis not present

## 2020-05-22 DIAGNOSIS — N183 Chronic kidney disease, stage 3 unspecified: Secondary | ICD-10-CM | POA: Diagnosis not present

## 2020-05-22 DIAGNOSIS — D631 Anemia in chronic kidney disease: Secondary | ICD-10-CM | POA: Diagnosis not present

## 2020-06-24 DIAGNOSIS — N186 End stage renal disease: Secondary | ICD-10-CM | POA: Diagnosis not present

## 2020-06-24 DIAGNOSIS — D849 Immunodeficiency, unspecified: Secondary | ICD-10-CM | POA: Diagnosis not present

## 2020-06-24 DIAGNOSIS — I10 Essential (primary) hypertension: Secondary | ICD-10-CM | POA: Diagnosis not present

## 2020-06-24 DIAGNOSIS — Z940 Kidney transplant status: Secondary | ICD-10-CM | POA: Diagnosis not present

## 2020-07-01 DIAGNOSIS — J069 Acute upper respiratory infection, unspecified: Secondary | ICD-10-CM | POA: Diagnosis not present

## 2020-07-01 DIAGNOSIS — Z712 Person consulting for explanation of examination or test findings: Secondary | ICD-10-CM | POA: Diagnosis not present

## 2020-07-01 DIAGNOSIS — Z20822 Contact with and (suspected) exposure to covid-19: Secondary | ICD-10-CM | POA: Diagnosis not present

## 2020-07-01 DIAGNOSIS — E876 Hypokalemia: Secondary | ICD-10-CM | POA: Diagnosis not present

## 2020-07-01 DIAGNOSIS — Z940 Kidney transplant status: Secondary | ICD-10-CM | POA: Diagnosis not present

## 2020-07-01 DIAGNOSIS — Z0001 Encounter for general adult medical examination with abnormal findings: Secondary | ICD-10-CM | POA: Diagnosis not present

## 2020-07-04 ENCOUNTER — Other Ambulatory Visit: Payer: Self-pay | Admitting: Nurse Practitioner

## 2020-07-04 ENCOUNTER — Ambulatory Visit (HOSPITAL_COMMUNITY)
Admission: RE | Admit: 2020-07-04 | Discharge: 2020-07-04 | Disposition: A | Discharge status: Home / Self Care | Payer: Managed Care, Other (non HMO) | Source: Ambulatory Visit | Attending: Internal Medicine | Admitting: Internal Medicine

## 2020-07-04 ENCOUNTER — Other Ambulatory Visit (HOSPITAL_COMMUNITY): Payer: Self-pay

## 2020-07-04 VITALS — BP 124/95 | HR 63 | Temp 98.7°F | Resp 18

## 2020-07-04 DIAGNOSIS — U071 COVID-19: Secondary | ICD-10-CM | POA: Insufficient documentation

## 2020-07-04 MED ORDER — EPINEPHRINE 0.3 MG/0.3ML IJ SOAJ: 0.3000 mg | Freq: Once | INTRAMUSCULAR | Status: DC | PRN

## 2020-07-04 MED ORDER — FAMOTIDINE IN NACL 20-0.9 MG/50ML-% IV SOLN: 20.0000 mg | Freq: Once | INTRAVENOUS | Status: DC | PRN

## 2020-07-04 MED ORDER — METHYLPREDNISOLONE SODIUM SUCC 125 MG IJ SOLR: 125.0000 mg | Freq: Once | INTRAMUSCULAR | Status: DC | PRN

## 2020-07-04 MED ORDER — DIPHENHYDRAMINE HCL 50 MG/ML IJ SOLN: 50.0000 mg | Freq: Once | INTRAMUSCULAR | Status: DC | PRN

## 2020-07-04 MED ORDER — SODIUM CHLORIDE 0.9 % IV SOLN: INTRAVENOUS | Status: DC | PRN

## 2020-07-04 MED ORDER — ALBUTEROL SULFATE HFA 108 (90 BASE) MCG/ACT IN AERS: 2.0000 | INHALATION_SPRAY | Freq: Once | RESPIRATORY_TRACT | Status: DC | PRN

## 2020-07-04 MED ORDER — SODIUM CHLORIDE 0.9 % IV SOLN
1200.0000 mg | Freq: Once | INTRAVENOUS | Status: AC
  Administered 2020-07-04: 1200 mg via INTRAVENOUS

## 2020-07-04 NOTE — Discharge Instructions (Signed)

## 2020-07-04 NOTE — Progress Notes (Signed)
I connected by phone with [name] on 07/04/2020 at 12:41 PM to discuss the potential use of an new treatment for mild to moderate COVID-19 viral infection in non-hospitalized patients.  This patient is a 39 y.o. female that meets the FDA criteria for Emergency Use Authorization of casirivimab\imdevimab.  Has a (+) direct SARS-CoV-2 viral test result  Has mild or moderate COVID-19   Is ? 41 years of age and weighs ? 40 kg  Is NOT hospitalized due to COVID-19  Is NOT requiring oxygen therapy or requiring an increase in baseline oxygen flow rate due to COVID-19  Is within 10 days of symptom onset  Has at least one of the high risk factor(s) for progression to severe COVID-19 and/or hospitalization as defined in EUA.  Specific high risk criteria : BMI > 25, Immunosuppressive Disease or Treatment and Cardiovascular disease or hypertension   Onset 06/28/20. Vaccinated.    I have spoken and communicated the following to the patient or parent/caregiver:  1. FDA has authorized the emergency use of casirivimab\imdevimab for the treatment of mild to moderate COVID-19 in adults and pediatric patients with positive results of direct SARS-CoV-2 viral testing who are 41 years of age and older weighing at least 40 kg, and who are at high risk for progressing to severe COVID-19 and/or hospitalization.  2. The significant known and potential risks and benefits of casirivimab\imdevimab, and the extent to which such potential risks and benefits are unknown.  3. Information on available alternative treatments and the risks and benefits of those alternatives, including clinical trials.  4. Patients treated with casirivimab\imdevimab should continue to self-isolate and use infection control measures (e.g., wear mask, isolate, social distance, avoid sharing personal items, clean and disinfect "high touch" surfaces, and frequent handwashing) according to CDC guidelines.   5. The patient or  parent/caregiver has the option to accept or refuse casirivimab\imdevimab .  After reviewing this information with the patient, the patient has agreed to receive one of the available covid 19 monoclonal antibodies and will be provided an appropriate fact sheet prior to infusion.Beckey Rutter, [place], AGNP-C [phone] ([place])

## 2020-07-04 NOTE — Progress Notes (Signed)
  Diagnosis: COVID-19  Physician: Dr. Joya Gaskins  Procedure: Covid Infusion Clinic Med: casirivimab\imdevimab infusion - Provided patient with casirivimab\imdevimab fact sheet for patients, parents and caregivers prior to infusion.  Complications: No immediate complications noted.  Discharge: Discharged home   Raven Walker 07/04/2020

## 2020-07-04 NOTE — Progress Notes (Signed)
I connected by phone with Raven Walker on 07/04/2020 at 2:41 PM to discuss the potential use of a new treatment for mild to moderate COVID-19 viral infection in non-hospitalized patients.  This patient is a 39 y.o. female that meets the FDA criteria for Emergency Use Authorization of COVID monoclonal antibody casirivimab/imdevimab or bamlanivimab/eteseviamb.  Has a (+) direct SARS-CoV-2 viral test result  Has mild or moderate COVID-19   Is NOT hospitalized due to COVID-19  Is within 10 days of symptom onset  Has at least one of the high risk factor(s) for progression to severe COVID-19 and/or hospitalization as defined in EUA.  Specific high risk criteria : Immunosuppressive Disease or Treatment   Sx onset 9/23   I have spoken and communicated the following to the patient or parent/caregiver regarding COVID monoclonal antibody treatment:  1. FDA has authorized the emergency use for the treatment of mild to moderate COVID-19 in adults and pediatric patients with positive results of direct SARS-CoV-2 viral testing who are 110 years of age and older weighing at least 40 kg, and who are at high risk for progressing to severe COVID-19 and/or hospitalization.  2. The significant known and potential risks and benefits of COVID monoclonal antibody, and the extent to which such potential risks and benefits are unknown.  3. Information on available alternative treatments and the risks and benefits of those alternatives, including clinical trials.  4. Patients treated with COVID monoclonal antibody should continue to self-isolate and use infection control measures (e.g., wear mask, isolate, social distance, avoid sharing personal items, clean and disinfect "high touch" surfaces, and frequent handwashing) according to CDC guidelines.   5. The patient or parent/caregiver has the option to accept or refuse COVID monoclonal antibody treatment.  After reviewing this information with the patient,  the patient has agreed to receive one of the available covid 19 monoclonal antibodies and will be provided an appropriate fact sheet prior to infusion. Scot Dock, NP 07/04/2020 2:41 PM

## 2020-07-09 DIAGNOSIS — U071 COVID-19: Secondary | ICD-10-CM | POA: Diagnosis not present

## 2020-07-09 DIAGNOSIS — J069 Acute upper respiratory infection, unspecified: Secondary | ICD-10-CM | POA: Diagnosis not present

## 2020-07-09 DIAGNOSIS — Z20822 Contact with and (suspected) exposure to covid-19: Secondary | ICD-10-CM | POA: Diagnosis not present

## 2020-07-15 DIAGNOSIS — Z8616 Personal history of COVID-19: Secondary | ICD-10-CM | POA: Diagnosis not present

## 2021-04-08 ENCOUNTER — Other Ambulatory Visit (HOSPITAL_COMMUNITY): Payer: Self-pay | Admitting: Family Medicine

## 2021-04-08 ENCOUNTER — Ambulatory Visit (HOSPITAL_COMMUNITY)
Admission: RE | Admit: 2021-04-08 | Discharge: 2021-04-08 | Disposition: A | Discharge status: Home / Self Care | Payer: Managed Care, Other (non HMO) | Source: Ambulatory Visit | Attending: Family Medicine | Admitting: Family Medicine

## 2021-04-08 ENCOUNTER — Other Ambulatory Visit: Payer: Self-pay

## 2021-04-08 DIAGNOSIS — M7989 Other specified soft tissue disorders: Secondary | ICD-10-CM

## 2021-04-28 ENCOUNTER — Ambulatory Visit (INDEPENDENT_AMBULATORY_CARE_PROVIDER_SITE_OTHER): Payer: Managed Care, Other (non HMO) | Admitting: Podiatry

## 2021-04-28 ENCOUNTER — Ambulatory Visit (INDEPENDENT_AMBULATORY_CARE_PROVIDER_SITE_OTHER): Payer: Managed Care, Other (non HMO)

## 2021-04-28 ENCOUNTER — Other Ambulatory Visit: Payer: Self-pay

## 2021-04-28 ENCOUNTER — Encounter: Payer: Self-pay | Admitting: Podiatry

## 2021-04-28 DIAGNOSIS — M79671 Pain in right foot: Secondary | ICD-10-CM

## 2021-04-28 DIAGNOSIS — M7989 Other specified soft tissue disorders: Secondary | ICD-10-CM | POA: Diagnosis not present

## 2021-04-28 DIAGNOSIS — M79672 Pain in left foot: Secondary | ICD-10-CM

## 2021-04-28 DIAGNOSIS — M722 Plantar fascial fibromatosis: Secondary | ICD-10-CM

## 2021-04-28 MED ORDER — TRIAMCINOLONE ACETONIDE 10 MG/ML IJ SUSP
10.0000 mg | Freq: Once | INTRAMUSCULAR | Status: AC
  Administered 2021-04-28: 10 mg

## 2021-04-28 NOTE — Patient Instructions (Signed)
For instructions on how to put on your Plantar Fascial Brace, please visit [email or web address] While at your visit today you received a steroid injection in your foot or ankle to help with your pain. Along with having the steroid medication there is some "numbing" medication in the shot that you received. Due to this you may notice some numbness to the area for the next couple of hours.    The actually benefit from the steroid injection may take up to 2-7 days to see a difference. You may actually experience a small (as in 10%) INCREASE in pain in the first 24 hours---that is common. It would be best if you can ice the area today and take anti-inflammatory medications (such as Ibuprofen, Motrin, or Aleve) if you are able to take these medications. If you were prescribed another medication to help with the pain go ahead and start that medication today    Things to watch out for that you should contact us or a health care provider urgently would include: 1. Unusual (as in more than 10%) increase in pain 2. New fever > 101.5 3. New swelling or redness of the injected area.  4. Streaking of red lines around the area injected.  If you have any questions or concerns about this, please give our office a call at [phone].     Plantar Fasciitis (Heel Spur Syndrome) with Rehab The plantar fascia is a fibrous, ligament-like, soft-tissue structure that spans the bottom of the foot. Plantar fasciitis is a condition that causes pain in the foot due to inflammation of the tissue. SYMPTOMS  Pain and tenderness on the underneath side of the foot. Pain that worsens with standing or walking. CAUSES  Plantar fasciitis is caused by irritation and injury to the plantar fascia on the underneath side of the foot. Common mechanisms of injury include: Direct trauma to bottom of the foot. Damage to a small nerve that runs under the foot where the main fascia attaches to the heel bone. Stress placed on the  plantar fascia due to bone spurs. RISK INCREASES WITH:  Activities that place stress on the plantar fascia (running, jumping, pivoting, or cutting). Poor strength and flexibility. Improperly fitted shoes. Tight calf muscles. Flat feet. Failure to warm-up properly before activity. Obesity. PREVENTION Warm up and stretch properly before activity. Allow for adequate recovery between workouts. Maintain physical fitness: Strength, flexibility, and endurance. Cardiovascular fitness. Maintain a health body weight. Avoid stress on the plantar fascia. Wear properly fitted shoes, including arch supports for individuals who have flat feet.  PROGNOSIS  If treated properly, then the symptoms of plantar fasciitis usually resolve without surgery. However, occasionally surgery is necessary.  RELATED COMPLICATIONS  Recurrent symptoms that may result in a chronic condition. Problems of the lower back that are caused by compensating for the injury, such as limping. Pain or weakness of the foot during push-off following surgery. Chronic inflammation, scarring, and partial or complete fascia tear, occurring more often from repeated injections.  TREATMENT  Treatment initially involves the use of ice and medication to help reduce pain and inflammation. The use of strengthening and stretching exercises may help reduce pain with activity, especially stretches of the Achilles tendon. These exercises may be performed at home or with a therapist. Your caregiver may recommend that you use heel cups of arch supports to help reduce stress on the plantar fascia. Occasionally, corticosteroid injections are given to reduce inflammation. If symptoms persist for greater than 6 months despite non-surgical (conservative), then surgery  may be recommended.   MEDICATION  If pain medication is necessary, then nonsteroidal anti-inflammatory medications, such as aspirin and ibuprofen, or other minor pain relievers, such as  acetaminophen, are often recommended. Do not take pain medication within 7 days before surgery. Prescription pain relievers may be given if deemed necessary by your caregiver. Use only as directed and only as much as you need. Corticosteroid injections may be given by your caregiver. These injections should be reserved for the most serious cases, because they may only be given a certain number of times.  HEAT AND COLD Cold treatment (icing) relieves pain and reduces inflammation. Cold treatment should be applied for 10 to 15 minutes every 2 to 3 hours for inflammation and pain and immediately after any activity that aggravates your symptoms. Use ice packs or massage the area with a piece of ice (ice massage). Heat treatment may be used prior to performing the stretching and strengthening activities prescribed by your caregiver, physical therapist, or athletic trainer. Use a heat pack or soak the injury in warm water.  SEEK IMMEDIATE MEDICAL CARE IF: Treatment seems to offer no benefit, or the condition worsens. Any medications produce adverse side effects.  EXERCISES- RANGE OF MOTION (ROM) AND STRETCHING EXERCISES - Plantar Fasciitis (Heel Spur Syndrome) These exercises may help you when beginning to rehabilitate your injury. Your symptoms may resolve with or without further involvement from your physician, physical therapist or athletic trainer. While completing these exercises, remember:  Restoring tissue flexibility helps normal motion to return to the joints. This allows healthier, less painful movement and activity. An effective stretch should be held for at least 30 seconds. A stretch should never be painful. You should only feel a gentle lengthening or release in the stretched tissue.  RANGE OF MOTION - Toe Extension, Flexion Sit with your right / left leg crossed over your opposite knee. Grasp your toes and gently pull them back toward the top of your foot. You should feel a stretch on  the bottom of your toes and/or foot. Hold this stretch for 10 seconds. Now, gently pull your toes toward the bottom of your foot. You should feel a stretch on the top of your toes and or foot. Hold this stretch for 10 seconds. Repeat  times. Complete this stretch 3 times per day.   RANGE OF MOTION - Ankle Dorsiflexion, Active Assisted Remove shoes and sit on a chair that is preferably not on a carpeted surface. Place right / left foot under knee. Extend your opposite leg for support. Keeping your heel down, slide your right / left foot back toward the chair until you feel a stretch at your ankle or calf. If you do not feel a stretch, slide your bottom forward to the edge of the chair, while still keeping your heel down. Hold this stretch for 10 seconds. Repeat 3 times. Complete this stretch 2 times per day.   STRETCH  Gastroc, Standing Place hands on wall. Extend right / left leg, keeping the front knee somewhat bent. Slightly point your toes inward on your back foot. Keeping your right / left heel on the floor and your knee straight, shift your weight toward the wall, not allowing your back to arch. You should feel a gentle stretch in the right / left calf. Hold this position for 10 seconds. Repeat 3 times. Complete this stretch 2 times per day.  STRETCH  Soleus, Standing Place hands on wall. Extend right / left leg, keeping the other knee somewhat  bent. Slightly point your toes inward on your back foot. Keep your right / left heel on the floor, bend your back knee, and slightly shift your weight over the back leg so that you feel a gentle stretch deep in your back calf. Hold this position for 10 seconds. Repeat 3 times. Complete this stretch 2 times per day.  STRETCH  Gastrocsoleus, Standing  Note: This exercise can place a lot of stress on your foot and ankle. Please complete this exercise only if specifically instructed by your caregiver.  Place the ball of your right / left foot  on a step, keeping your other foot firmly on the same step. Hold on to the wall or a rail for balance. Slowly lift your other foot, allowing your body weight to press your heel down over the edge of the step. You should feel a stretch in your right / left calf. Hold this position for 10 seconds. Repeat this exercise with a slight bend in your right / left knee. Repeat 3 times. Complete this stretch 2 times per day.   STRENGTHENING EXERCISES - Plantar Fasciitis (Heel Spur Syndrome)  These exercises may help you when beginning to rehabilitate your injury. They may resolve your symptoms with or without further involvement from your physician, physical therapist or athletic trainer. While completing these exercises, remember:  Muscles can gain both the endurance and the strength needed for everyday activities through controlled exercises. Complete these exercises as instructed by your physician, physical therapist or athletic trainer. Progress the resistance and repetitions only as guided.  STRENGTH - Towel Curls Sit in a chair positioned on a non-carpeted surface. Place your foot on a towel, keeping your heel on the floor. Pull the towel toward your heel by only curling your toes. Keep your heel on the floor. Repeat 3 times. Complete this exercise 2 times per day.  STRENGTH - Ankle Inversion Secure one end of a rubber exercise band/tubing to a fixed object (table, pole). Loop the other end around your foot just before your toes. Place your fists between your knees. This will focus your strengthening at your ankle. Slowly, pull your big toe up and in, making sure the band/tubing is positioned to resist the entire motion. Hold this position for 10 seconds. Have your muscles resist the band/tubing as it slowly pulls your foot back to the starting position. Repeat 3 times. Complete this exercises 2 times per day.  Document Released: 09/21/2005 Document Revised: 12/14/2011 Document Reviewed:  01/03/2009 [facility] Patient Information 2014 [place], [place].

## 2021-04-30 ENCOUNTER — Other Ambulatory Visit: Payer: Self-pay | Admitting: Podiatry

## 2021-04-30 DIAGNOSIS — M722 Plantar fascial fibromatosis: Secondary | ICD-10-CM

## 2021-05-01 NOTE — Progress Notes (Signed)
Subjective:   Patient ID: Raven Walker, female   DOB: 40 y.o.   MRN: [id number]   HPI 40 year old female presents the office today for concerns of heel pain.  She gets on her right side as well.  She previously had been to her PCP and an x-ray was performed on the right side.  No fracture or dislocation noted but there was swelling.  She states that she went back to work about a year ago standing more.  She is about 3 months is beginning more tender.  She is tried shoes and inserts.  She also previously on prednisone.  She did help with the swelling but not the pain.  The pain is worse in the left than the right but the swelling is worse on the right than the left.  No radiating pain or weakness.  Symptoms are worse after standing.  No other concerns.  History of kidney transplant   Review of Systems  All other systems reviewed and are negative.  Past Medical History:  Diagnosis Date   Anemia    history   BV (bacterial vaginosis) 06/04/2014   ESRD on peritoneal dialysis (HCC) 2008   pt had kidney transplant 06/3012 and is no longer on dialysis   GERD (gastroesophageal reflux disease) Dec. 2010   ulcerative esophagitis per EGD    Hematuria 07/02/2014   Hypertension    Poor appetite    History   Secondary hyperparathyroidism (HCC)    s/p parathyroidectomy with autotransplantation to left arm per Dr. Fredric Dine on 08/04/11   Streptococcal peritonitis (HCC)  Sept. 2012   SVD (spontaneous vaginal delivery)    x 1   Urinary frequency 07/02/2014   UTI (lower urinary tract infection) 07/02/2014   Vaginal discharge 06/04/2014    Past Surgical History:  Procedure Laterality Date   arm graphs  2008   three - done at [facility]; still in but never used because pt got a transplant.   DILATATION & CURETTAGE/HYSTEROSCOPY WITH TRUECLEAR N/A 04/16/2014   Procedure: DILATATION & CURETTAGE/HYSTEROSCOPY WITH TRUCLEAR AND RESECTION OF FIBROID/THERMACHOICE ABLATION;  Surgeon: Tilda Burrow, MD;  Location: [facility];  Service: Gynecology;  Laterality: N/A;   KIDNEY TRANSPLANT Right 2013   [facility]   PARATHYROIDECTOMY  08/05/11   with graft of parathyroid into left upper arm.   WISDOM TOOTH EXTRACTION       Current Outpatient Medications:    calcitRIOL (ROCALTROL) 0.25 MCG capsule, Take by mouth., Disp: , Rfl:    metoprolol tartrate (LOPRESSOR) 50 MG tablet, Take by mouth., Disp: , Rfl:    pantoprazole (PROTONIX) 40 MG tablet, Take by mouth., Disp: , Rfl:    tacrolimus (PROGRAF) 1 MG capsule, Take by mouth., Disp: , Rfl:    Acetaminophen 500 MG coapsule, Take 500-1,000 mg by mouth every 6 (six) hours as needed for pain. , Disp: , Rfl:    calcitRIOL (ROCALTROL) 0.5 MCG capsule, Take 0.5 mcg by mouth 2 (two) times daily., Disp: , Rfl:    calcium carbonate (TUMS - DOSED IN MG ELEMENTAL CALCIUM) 500 MG chewable tablet, Chew 3 tablets by mouth 3 (three) times daily., Disp: , Rfl:    Cholecalciferol 1000 UNITS tablet, Take 1,000 Units by mouth daily., Disp: , Rfl:    diltiazem (CARDIZEM) 30 MG tablet, Take 30 mg by mouth 2 (two) times daily., Disp: , Rfl:    furosemide (LASIX) 40 MG tablet, Take 40 mg by mouth daily., Disp: , Rfl:  Iron-FA-B Cmp-C-Biot-Probiotic (FUSION PLUS) CAPS, Take 1 capsule by mouth daily., Disp: , Rfl:    mycophenolate (CELLCEPT) 250 MG capsule, Take 750 mg by mouth 2 (two) times daily. , Disp: , Rfl:    pantoprazole (PROTONIX) 40 MG tablet, Take 40 mg by mouth 2 (two) times daily. , Disp: , Rfl:   Allergies  Allergen Reactions   Nickel Itching and Rash    Only where touched          Objective:  Physical Exam  General: AAO x3, NAD  Dermatological: Skin is warm, dry and supple bilateral.  There are no open sores, no preulcerative lesions, no rash or signs of infection present.  Vascular: Dorsalis Pedis artery and Posterior Tibial artery pedal pulses are 2/4 bilateral with immedate capillary fill time. There is no pain with calf compression,  swelling, warmth, erythema.   Neruologic: Grossly intact via light touch bilateral.  Negative Tinel sign.  Musculoskeletal: Tenderness to palpation along the plantar medial tubercle of the calcaneus at the insertion of plantar fascia on the left > right foot. There is no pain along the course of the plantar fascia within the arch of the foot. Plantar fascia appears to be intact. There is no pain with lateral compression of the calcaneus or pain with vibratory sensation. There is no pain along the course or insertion of the achilles tendon. No other areas of tenderness to bilateral lower extremities. Muscular strength 5/5 in all groups tested bilateral.  Edema present right side worse than left without any erythema.  Gait: Unassisted, Nonantalgic.       Assessment:   Bilateral heel pain, plantar fasciitis, foot swelling right side     Plan:  -Treatment options discussed including all alternatives, risks, and complications -Etiology of symptoms were discussed -Review the x-rays of the right foot new x-rays were obtained of the left foot.  No evidence of acute fracture. -Bilateral steroid injections performed.  See procedure note below. -Plantar fascial brace is dispensed. -Discussed shoe modifications.  Discussed arch supports.  She has tried several of these. -Stretching, icing daily. -Symptoms continue and particular on the side with swelling we will proceed with MRI.  Procedure: Injection Tendon/Ligament Discussed alternatives, risks, complications and verbal consent was obtained.  Location: Bilateral plantar fascia at the glabrous junction; medial approach. Skin Prep: Alcohol. Injectate: 0.5cc 0.5% marcaine plain, 0.5 cc 2% lidocaine plain and, 1 cc kenalog 10. Disposition: Patient tolerated procedure well. Injection site dressed with a band-aid.  Post-injection care was discussed and return precautions discussed.     Trula Slade DPM

## 2021-05-22 ENCOUNTER — Ambulatory Visit: Payer: Managed Care, Other (non HMO) | Admitting: Podiatry

## 2021-06-10 ENCOUNTER — Ambulatory Visit (INDEPENDENT_AMBULATORY_CARE_PROVIDER_SITE_OTHER): Payer: Managed Care, Other (non HMO) | Admitting: Podiatry

## 2021-06-10 ENCOUNTER — Other Ambulatory Visit: Payer: Self-pay

## 2021-06-10 ENCOUNTER — Ambulatory Visit (INDEPENDENT_AMBULATORY_CARE_PROVIDER_SITE_OTHER): Payer: Managed Care, Other (non HMO)

## 2021-06-10 DIAGNOSIS — M7989 Other specified soft tissue disorders: Secondary | ICD-10-CM | POA: Diagnosis not present

## 2021-06-10 DIAGNOSIS — M79672 Pain in left foot: Secondary | ICD-10-CM | POA: Diagnosis not present

## 2021-06-10 DIAGNOSIS — M79671 Pain in right foot: Secondary | ICD-10-CM

## 2021-06-10 DIAGNOSIS — M722 Plantar fascial fibromatosis: Secondary | ICD-10-CM | POA: Diagnosis not present

## 2021-06-16 NOTE — Progress Notes (Signed)
Subjective: 40 year old female presents the office today with concerns of swelling to her right foot, heel pain.  After last appointment she states the injections helped considerably and had almost no pain however it started to come back mostly on the right side but she is also developed swelling.  No recent injury or trauma that she reports.  No other concerns today.  Objective: AAO x3, NAD DP/PT pulses palpable bilaterally, CRT less than 3 seconds There is swelling to the right foot.  There is no associated erythema.  No warmth.  The majority tenderness is localized in the plantar aspect calcaneus on insertion plantar fascial.  There is slight discomfort with lateral compression of calcaneus.  No pain in the posterior calcaneus.  No pain Achilles tendon.  There is no other areas of pinpoint tenderness.  MMT 5/5. No pain with calf compression, swelling, warmth, erythema  Assessment: 40 year old female with swelling right foot, concern for possible stress fracture calcaneus  Plan: -All treatment options discussed with the patient including all alternatives, risks, complications.  -X-rays obtained and reviewed.  No definitive subacute fracture or stress fracture. -Given her symptoms I want to hold off on a steroid injection today.  Recommend immobilization in cam boot and can was dispensed.  Unna boot was applied and precautions were advised on when to remove this.  Ice and elevation.  -Patient encouraged to call the office with any questions, concerns, change in symptoms.   Trula Slade DPM

## 2021-06-30 ENCOUNTER — Ambulatory Visit: Payer: Managed Care, Other (non HMO) | Admitting: Podiatry

## 2021-12-01 ENCOUNTER — Other Ambulatory Visit (HOSPITAL_COMMUNITY): Payer: Self-pay | Admitting: Family Medicine

## 2021-12-01 DIAGNOSIS — Z1231 Encounter for screening mammogram for malignant neoplasm of breast: Secondary | ICD-10-CM

## 2021-12-04 ENCOUNTER — Ambulatory Visit (HOSPITAL_COMMUNITY)
Admission: RE | Admit: 2021-12-04 | Discharge: 2021-12-04 | Disposition: A | Discharge status: Home / Self Care | Payer: 59 | Source: Ambulatory Visit | Attending: Family Medicine | Admitting: Family Medicine

## 2021-12-04 ENCOUNTER — Other Ambulatory Visit: Payer: Self-pay

## 2021-12-04 ENCOUNTER — Other Ambulatory Visit (HOSPITAL_COMMUNITY): Payer: Self-pay | Admitting: Family Medicine

## 2021-12-04 ENCOUNTER — Encounter (HOSPITAL_COMMUNITY): Payer: Self-pay

## 2021-12-04 DIAGNOSIS — Z1231 Encounter for screening mammogram for malignant neoplasm of breast: Secondary | ICD-10-CM | POA: Insufficient documentation

## 2021-12-08 ENCOUNTER — Other Ambulatory Visit (HOSPITAL_COMMUNITY): Payer: Self-pay | Admitting: Family Medicine

## 2021-12-08 DIAGNOSIS — N6452 Nipple discharge: Secondary | ICD-10-CM

## 2021-12-10 ENCOUNTER — Ambulatory Visit (HOSPITAL_COMMUNITY): Payer: Managed Care, Other (non HMO)

## 2021-12-25 ENCOUNTER — Other Ambulatory Visit (HOSPITAL_COMMUNITY): Payer: 59

## 2021-12-25 ENCOUNTER — Ambulatory Visit (HOSPITAL_COMMUNITY): Payer: 59

## 2021-12-25 ENCOUNTER — Encounter (HOSPITAL_COMMUNITY): Payer: 59

## 2022-01-06 ENCOUNTER — Ambulatory Visit (HOSPITAL_COMMUNITY)
Admission: RE | Admit: 2022-01-06 | Discharge: 2022-01-06 | Disposition: A | Discharge status: Home / Self Care | Payer: 59 | Source: Ambulatory Visit | Attending: Family Medicine | Admitting: Family Medicine

## 2022-01-06 ENCOUNTER — Other Ambulatory Visit (HOSPITAL_COMMUNITY): Payer: Self-pay | Admitting: Family Medicine

## 2022-01-06 ENCOUNTER — Encounter (HOSPITAL_COMMUNITY): Payer: Self-pay | Admitting: Radiology

## 2022-01-06 ENCOUNTER — Encounter (HOSPITAL_COMMUNITY): Payer: Self-pay

## 2022-01-06 DIAGNOSIS — N6452 Nipple discharge: Secondary | ICD-10-CM

## 2022-01-06 DIAGNOSIS — R928 Other abnormal and inconclusive findings on diagnostic imaging of breast: Secondary | ICD-10-CM

## 2022-01-07 ENCOUNTER — Other Ambulatory Visit: Payer: Self-pay | Admitting: Family Medicine

## 2022-01-07 ENCOUNTER — Other Ambulatory Visit (HOSPITAL_COMMUNITY): Payer: Self-pay | Admitting: Family Medicine

## 2022-01-07 DIAGNOSIS — R921 Mammographic calcification found on diagnostic imaging of breast: Secondary | ICD-10-CM

## 2022-01-07 DIAGNOSIS — R928 Other abnormal and inconclusive findings on diagnostic imaging of breast: Secondary | ICD-10-CM

## 2022-01-13 ENCOUNTER — Encounter (HOSPITAL_COMMUNITY): Payer: Self-pay

## 2022-01-13 ENCOUNTER — Other Ambulatory Visit (HOSPITAL_COMMUNITY): Payer: Self-pay | Admitting: Internal Medicine

## 2022-01-13 ENCOUNTER — Ambulatory Visit (HOSPITAL_COMMUNITY)
Admission: RE | Admit: 2022-01-13 | Discharge: 2022-01-13 | Disposition: A | Discharge status: Home / Self Care | Payer: 59 | Source: Ambulatory Visit | Attending: Internal Medicine | Admitting: Internal Medicine

## 2022-01-13 ENCOUNTER — Ambulatory Visit (HOSPITAL_COMMUNITY)
Admission: RE | Admit: 2022-01-13 | Discharge: 2022-01-13 | Disposition: A | Discharge status: Home / Self Care | Payer: 59 | Source: Ambulatory Visit | Attending: Family Medicine | Admitting: Family Medicine

## 2022-01-13 DIAGNOSIS — R928 Other abnormal and inconclusive findings on diagnostic imaging of breast: Secondary | ICD-10-CM | POA: Insufficient documentation

## 2022-01-13 MED ORDER — LIDOCAINE HCL (PF) 2 % IJ SOLN
INTRAMUSCULAR | Status: AC
  Administered 2022-01-13: 10 mL
  Filled 2022-01-13: qty 10

## 2022-01-13 NOTE — Progress Notes (Signed)
Patient brought into [place] suite, changed to gown, prepped and draped with sterile technique. Lidocaine admin without complication. US imaging obtained. Biopsy catheter introduced in L breast without complication, under guidance. Tolerated well. Samples obtained, prepped, secured. Device withdrawn. Small amount blood present. Dressed with gauze and tape. ?

## 2022-01-16 LAB — SURGICAL PATHOLOGY

## 2022-01-22 ENCOUNTER — Ambulatory Visit
Admission: RE | Admit: 2022-01-22 | Discharge: 2022-01-22 | Disposition: A | Discharge status: Home / Self Care | Payer: 59 | Source: Ambulatory Visit | Attending: Family Medicine | Admitting: Family Medicine

## 2022-01-22 DIAGNOSIS — R921 Mammographic calcification found on diagnostic imaging of breast: Secondary | ICD-10-CM

## 2022-01-22 DIAGNOSIS — R928 Other abnormal and inconclusive findings on diagnostic imaging of breast: Secondary | ICD-10-CM

## 2022-02-06 ENCOUNTER — Telehealth: Payer: Self-pay | Admitting: Hematology and Oncology

## 2022-02-06 NOTE — Telephone Encounter (Signed)
Scheduled appt per 5/5 referral. Pt is aware of appt date and time. Pt is aware to arrive 15 mins prior to appt time and to bring and updated insurance card. Pt is aware of appt location.   ?

## 2022-02-09 ENCOUNTER — Ambulatory Visit: Payer: 59 | Attending: General Surgery

## 2022-02-09 ENCOUNTER — Other Ambulatory Visit: Payer: Self-pay

## 2022-02-09 ENCOUNTER — Inpatient Hospital Stay: Payer: 59 | Attending: Hematology and Oncology | Admitting: Hematology and Oncology

## 2022-02-09 DIAGNOSIS — Z8249 Family history of ischemic heart disease and other diseases of the circulatory system: Secondary | ICD-10-CM | POA: Insufficient documentation

## 2022-02-09 DIAGNOSIS — Z8349 Family history of other endocrine, nutritional and metabolic diseases: Secondary | ICD-10-CM | POA: Diagnosis not present

## 2022-02-09 DIAGNOSIS — Z823 Family history of stroke: Secondary | ICD-10-CM | POA: Diagnosis not present

## 2022-02-09 DIAGNOSIS — Z79621 Long term (current) use of calcineurin inhibitor: Secondary | ICD-10-CM | POA: Insufficient documentation

## 2022-02-09 DIAGNOSIS — D0511 Intraductal carcinoma in situ of right breast: Secondary | ICD-10-CM | POA: Insufficient documentation

## 2022-02-09 DIAGNOSIS — C50412 Malignant neoplasm of upper-outer quadrant of left female breast: Secondary | ICD-10-CM | POA: Diagnosis present

## 2022-02-09 DIAGNOSIS — R293 Abnormal posture: Secondary | ICD-10-CM | POA: Diagnosis present

## 2022-02-09 DIAGNOSIS — Z833 Family history of diabetes mellitus: Secondary | ICD-10-CM | POA: Diagnosis not present

## 2022-02-09 DIAGNOSIS — Z170 Estrogen receptor positive status [ER+]: Secondary | ICD-10-CM | POA: Diagnosis present

## 2022-02-09 DIAGNOSIS — C50411 Malignant neoplasm of upper-outer quadrant of right female breast: Secondary | ICD-10-CM | POA: Insufficient documentation

## 2022-02-09 DIAGNOSIS — Z79899 Other long term (current) drug therapy: Secondary | ICD-10-CM | POA: Diagnosis not present

## 2022-02-09 MED ORDER — TACROLIMUS 1 MG PO CAPS
3.0000 mg | ORAL_CAPSULE | Freq: Two times a day (BID) | ORAL | Status: AC
Start: 2022-02-09 — End: ?

## 2022-02-09 NOTE — Progress Notes (Signed)
[place] ?CONSULT NOTE ? ?Patient Care Team: ?Celene Squibb, MD as PCP - General (Internal Medicine) ?Mauricia Area, MD as Consulting Physician (Nephrology) ?Celene Squibb, MD (Internal Medicine) ? ?CHIEF COMPLAINTS/PURPOSE OF CONSULTATION:  ?Newly diagnosed bilateral breast cancers ? ?HISTORY OF PRESENTING ILLNESS:  ?Raven Walker 41 y.o. female is here because of recent diagnosis of bilateral breast cancers.  Patient complains of bloody nipple discharge and underwent mammograms on 01/06/2022.  Which showed 2 suspicious left breast masses 1.6 cm and 0.6 cm.  It also showed right breast calcifications measuring 0.7 cm.  She underwent biopsies of both the left breast masses and the right breast calcifications.  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 ?She saw Dr. Donne Hazel who recommended bilateral lumpectomies and is awaiting the results of genetic testing. ? ?I reviewed her records extensively and collaborated the history with the patient. ? ?SUMMARY OF ONCOLOGIC HISTORY: ?Oncology History  ? No history exists.  ? ? ? ?MEDICAL HISTORY:  ?Past Medical History:  ?Diagnosis Date  ? Anemia   ? history  ? BV (bacterial vaginosis) 06/04/2014  ? ESRD on peritoneal dialysis [facility]) 2008  ? pt had kidney transplant 06/3012 and is no longer on dialysis  ? GERD (gastroesophageal reflux disease) Dec. 2010  ? ulcerative esophagitis per EGD   ? Hematuria 07/02/2014  ? Hypertension   ? Poor appetite   ? History  ? Secondary hyperparathyroidism ([place])   ? s/p parathyroidectomy with autotransplantation to left arm per Dr. Baker Pierini on 08/04/11  ? Streptococcal peritonitis [facility])  Sept. 2012  ? SVD (spontaneous vaginal delivery)   ? x 1  ? Urinary frequency 07/02/2014  ? UTI (lower urinary tract infection) 07/02/2014  ? Vaginal discharge 06/04/2014  ? ? ?SURGICAL  HISTORY: ?Past Surgical History:  ?Procedure Laterality Date  ? arm graphs  2008  ? three - done at [facility]; still in but never used because pt got a transplant.  ? DILATATION & CURETTAGE/HYSTEROSCOPY WITH TRUECLEAR N/A 04/16/2014  ? Procedure: DILATATION & CURETTAGE/HYSTEROSCOPY WITH TRUCLEAR AND RESECTION OF FIBROID/THERMACHOICE ABLATION;  Surgeon: Jonnie Kind, MD;  Location: [facility];  Service: Gynecology;  Laterality: N/A;  ? KIDNEY TRANSPLANT Right 2013  ? [facility]  ? PARATHYROIDECTOMY  08/05/11  ? with graft of parathyroid into left upper arm.  ? WISDOM TOOTH EXTRACTION    ? ? ?SOCIAL HISTORY: ?Social History  ? ?Socioeconomic History  ? Marital status: Married  ?  Spouse name: Not on file  ? Number of children: Not on file  ? Years of education: Not on file  ? Highest education level: Not on file  ?Occupational History  ? Not on file  ?Tobacco Use  ? Smoking status: Not on file  ? Smokeless tobacco: Never  ?Substance and Sexual Activity  ? Alcohol use: Yes  ?  Comment: Once per month  ? Drug use: No  ? Sexual activity: Yes  ?  Birth control/protection: None  ?  Comment: ablation  ?Other Topics Concern  ? Not on file  ?Social History Narrative  ? ** Merged History Encounter **  ?    ? ?Social Determinants of Health  ? ?Financial Resource Strain: Not on file  ?Food Insecurity: Not on file  ?Transportation Needs: Not on file  ?Physical Activity: Not on file  ?Stress: Not  on file  ?Social Connections: Not on file  ?Intimate Partner Violence: Not on file  ? ? ?FAMILY HISTORY: ?Family History  ?Problem Relation Age of Onset  ? Hypertension Mother   ? Hypertension Father   ? Obesity Son   ? Cancer Maternal Uncle   ? Diabetes Maternal Grandmother   ? Stroke Maternal Grandfather   ? ? ?ALLERGIES:  is allergic to nickel. ? ?MEDICATIONS:  ?Current Outpatient Medications  ?Medication Sig Dispense Refill  ? Acetaminophen 500 MG coapsule Take 500-1,000 mg by mouth every 6 (six) hours as needed for pain.     ?  calcitRIOL (ROCALTROL) 0.25 MCG capsule Take by mouth.    ? calcium carbonate (TUMS - DOSED IN MG ELEMENTAL CALCIUM) 500 MG chewable tablet Chew 3 tablets by mouth 3 (three) times daily.    ? Cholecalciferol 1000 UNITS tablet Take 1,000 Units by mouth daily.    ? diltiazem (CARDIZEM) 30 MG tablet Take 30 mg by mouth 2 (two) times daily.    ? furosemide (LASIX) 40 MG tablet Take 40 mg by mouth daily.    ? Iron-FA-B Cmp-C-Biot-Probiotic (FUSION PLUS) CAPS Take 1 capsule by mouth daily.    ? mycophenolate (CELLCEPT) 250 MG capsule Take 750 mg by mouth 2 (two) times daily.     ? predniSONE (DELTASONE) 5 MG tablet Take 5 mg by mouth daily.    ? tacrolimus (PROGRAF) 1 MG capsule Take 3 capsules (3 mg total) by mouth 2 (two) times daily.    ? ?No current facility-administered medications for this visit.  ? ? ?REVIEW OF SYSTEMS:   ?Constitutional: Denies fevers, chills or abnormal night sweats ?Bloody nipple discharge ?All other systems were reviewed with the patient and are negative. ? ?PHYSICAL EXAMINATION: ?ECOG PERFORMANCE STATUS: 1 - Symptomatic but completely ambulatory ? ?Vitals:  ? 02/09/22 1211  ?BP: 128/88  ?Pulse: 65  ?Resp: 18  ?Temp: 98.1 ?F (36.7 ?C)  ?SpO2: 100%  ? ?Filed Weights  ? 02/09/22 1211  ?Weight: 229 lb 3.2 oz (104 kg)  ? ? ?LABORATORY DATA:  ?I have reviewed the data as listed ?Lab Results  ?Component Value Date  ? WBC 5.0 05/30/2015  ? HGB 10.4 (L) 05/30/2015  ? HCT 31.8 (L) 05/30/2015  ? MCV 80.1 05/30/2015  ? PLT 224 05/30/2015  ? ?Lab Results  ?Component Value Date  ? NA 139 05/30/2015  ? K 3.7 05/30/2015  ? CL 105 05/30/2015  ? CO2 25 05/30/2015  ? ? ?RADIOGRAPHIC STUDIES: ?I have personally reviewed the radiological reports and agreed with the findings in the report. ? ?ASSESSMENT AND PLAN:  ?Malignant neoplasm of upper-outer quadrant of left breast in female, estrogen receptor positive ([place]) ?01/22/2022: Work-up performed for intermittent bloody left nipple discharge.  Mammogram and  ultrasound revealed bilateral breast masses  ?Right breast 2 hypoechoic masses 1.6 cm and 0.6 cm.  No axillary lymph nodes.  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 ?Pathology review: I discussed with the patient the difference between DCIS and invasive breast cancer. It is considered a precancerous lesion. DCIS is classified as a 0. It is generally detected through mammograms as calcifications. We discussed the significance of grades and its impact on prognosis. We also discussed the importance of ER and PR receptors and HER2 and their implications to adjuvant treatment options. Prognosis of IDC and DCIS dependence  on grade, comedo necrosis. ? ?Recommendation: ?1.  Bilateral lumpectomies along with left sentinel lymph node biopsy ?2. Oncotype DX testing ?3. Followed by adjuvant radiation therapy ?4. Followed by antiestrogen therapy with tamoxifen ?10 years ?Genetic testing ? ?Tamoxifen counseling: We discussed the risks and benefits of tamoxifen. These include but not limited to insomnia, hot flashes, mood changes, vaginal dryness, and weight gain. Although rare, serious side effects including endometrial cancer, risk of blood clots were also discussed. We strongly believe that the benefits far outweigh the risks. Patient understands these risks and consented to starting treatment. Planned treatment duration is 10 years. ? ?She is working at a [employer] and plans to take short-term disability.  She has a daughter and a son.  Daughter is currently working and son is currently at home.  Her husband is a Printmaker. ? ?Return to clinic after surgery to discuss the final pathology report and come up with an adjuvant treatment plan. ? ? ? ?All questions were answered. The patient knows to call the clinic with any problems, questions or concerns. ?  ? Harriette Ohara, MD ?02/09/22 ? ?

## 2022-02-09 NOTE — Therapy (Signed)
?OUTPATIENT PHYSICAL THERAPY BREAST CANCER BASELINE EVALUATION ? ? ?Patient Name: Raven Walker ?MRN: [id number] ?DOB:20-May-1981, 41 y.o., female ?Today's Date: 02/09/2022 ? ? PT End of Session - 02/09/22 1403   ? ? Visit Number 1   ? Number of Visits 2   ? Date for PT Re-Evaluation 04/06/22   ? PT Start Time 2725   ? PT Stop Time 3664   ? PT Time Calculation (min) 50 min   ? Activity Tolerance Patient tolerated treatment well   ? Behavior During Therapy [facility] for tasks assessed/performed   ? ?  ?  ? ?  ? ? ?Past Medical History:  ?Diagnosis Date  ? Anemia   ? history  ? BV (bacterial vaginosis) 06/04/2014  ? ESRD on peritoneal dialysis [facility]) 2008  ? pt had kidney transplant 06/3012 and is no longer on dialysis  ? GERD (gastroesophageal reflux disease) Dec. 2010  ? ulcerative esophagitis per EGD   ? Hematuria 07/02/2014  ? Hypertension   ? Poor appetite   ? History  ? Secondary hyperparathyroidism ([place])   ? s/p parathyroidectomy with autotransplantation to left arm per Dr. Baker Pierini on 08/04/11  ? Streptococcal peritonitis [facility])  Sept. 2012  ? SVD (spontaneous vaginal delivery)   ? x 1  ? Urinary frequency 07/02/2014  ? UTI (lower urinary tract infection) 07/02/2014  ? Vaginal discharge 06/04/2014  ? ?Past Surgical History:  ?Procedure Laterality Date  ? arm graphs  2008  ? three - done at [facility]; still in but never used because pt got a transplant.  ? DILATATION & CURETTAGE/HYSTEROSCOPY WITH TRUECLEAR N/A 04/16/2014  ? Procedure: DILATATION & CURETTAGE/HYSTEROSCOPY WITH TRUCLEAR AND RESECTION OF FIBROID/THERMACHOICE ABLATION;  Surgeon: Jonnie Kind, MD;  Location: [place] [facility];  Service: Gynecology;  Laterality: N/A;  ? KIDNEY TRANSPLANT Right 2013  ? [facility]  ? PARATHYROIDECTOMY  08/05/11  ? with graft of parathyroid into left upper arm.  ? WISDOM TOOTH EXTRACTION    ? ?Patient Active Problem List  ? Diagnosis Date Noted  ? Malignant neoplasm of upper-outer quadrant of left breast in female, estrogen  receptor positive ([place]) 02/09/2022  ? Ductal carcinoma in situ (DCIS) of right breast 02/09/2022  ? Fibroids 01/06/2018  ? Abnormal uterine bleeding (AUB) 08/15/2015  ? Menorrhagia with regular cycle 03/14/2015  ? Urinary frequency 07/02/2014  ? Hematuria 07/02/2014  ? UTI (lower urinary tract infection) 07/02/2014  ? Vaginal discharge 06/04/2014  ? BV (bacterial vaginosis) 06/04/2014  ? Post-operative state 05/02/2014  ? Submucous uterine fibroid with menorrhagia 02/05/2014  ? Sepsis ([place]) 11/20/2013  ? UTI (urinary tract infection) 11/20/2013  ? Myalgia 11/20/2013  ? Acute on chronic renal failure ([place]) 11/20/2013  ? Leukocytosis, unspecified 11/20/2013  ? Menorrhagia, premenopausal 11/15/2013  ? Fever 08/30/2013  ? Acute pyelonephritis 06/29/2013  ? History of renal transplant 06/29/2013  ? Other ureteric obstruction 08/11/2012  ? 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 Edema of both legs 07/04/2012  ? Immunosuppression ([place]) 06/28/2012  ? Hypocalcemia syndrome 08/19/2011  ?  Class: Acute  ? Renal failure (ARF), acute on chronic ([place]) 08/19/2011  ?  Class: Acute  ? HTN (hypertension) 08/19/2011  ?  Class: Acute  ? GERD (gastroesophageal reflux disease) 08/19/2011  ?  Class: Chronic  ? Hyperparathyroidism, secondary ([place]) 05/19/2011  ? ? ? ? ?REFERRING PROVIDER: Rolm Bookbinder, MD ? ?REFERRING DIAG: Bilateral Breast Cancer ? ?THERAPY DIAG:  ?Malignant neoplasm of upper-outer quadrant of right breast in female, estrogen receptor positive ([place]) ? ?  Malignant neoplasm of upper-outer quadrant of left breast in female, estrogen receptor positive ([place]) ? ?Abnormal posture ? ?ONSET DATE: 01/26/2022 ? ?SUBJECTIVE                                                                                                                                                                                          ? ?SUBJECTIVE STATEMENT: ?Patient reports she is here today to be seen by her medical team for her newly diagnosed bilateral breast cancer.   ? ?PERTINENT HISTORY:  ?Patient was diagnosed on 01/26/2022 with bilateral  breast Cancer with Right  gr. 2 DCIS ER, PR + and left Invasive ductal carcinoma. It measures .6 cm and is located in the upper outer quadrant. It is ER, PR +, Her 2 - with a Ki67 of 1%. She is pending bilateral breast lumpectomies and left SLNB, followed by radiation and anti estrogen. She is s/p a kidney transplant in 2013 and is doing very well ? ?PATIENT GOALS   reduce lymphedema risk and learn post op HEP.  ? ?PAIN:  ?Are you having pain? No ? ? ?PRECAUTIONS: Active CA Other: Kidney transplant 2013 , grafts bilateral arms from prior peritoneal dialysis ? ?HAND DOMINANCE: right ? ?WEIGHT BEARING RESTRICTIONS No ? ?FALLS:  ?Has patient fallen in last 6 months? No ? ?LIVING ENVIRONMENT: ?Patient lives with: husband and son ?Lives in: House/apartment ?Has following equipment at home: none ?OCCUPATION: works at [employer], standing all day, usually 5-6 # ? ?LEISURE: read, puzzles, cards on I pad ? ?PRIOR LEVEL OF FUNCTION: Independent ? ? ?OBJECTIVE ? ?COGNITION: ? Overall cognitive status: Within functional limits for tasks assessed   ? ?POSTURE:  ?Forward head and rounded shoulders posture ? ?UPPER EXTREMITY AROM/PROM: ? ?A/PROM RIGHT  02/09/2022 ?  ?Shoulder extension 68  ?Shoulder flexion 170  ?Shoulder abduction 180  ?Shoulder internal rotation 76  ?Shoulder external rotation 103  ?  (Blank rows = not tested) ? ?A/PROM LEFT  02/09/2022  ?Shoulder extension 68  ?Shoulder flexion 165  ?Shoulder abduction 177  ?Shoulder internal rotation 75  ?Shoulder external rotation 95  ?  (Blank rows = not tested) ? ? ?CERVICAL AROM: ?All within normal limits:  ? ? ? ? ?UPPER EXTREMITY STRENGTH: WNL ? ? ?LYMPHEDEMA ASSESSMENTS:  ? ?LANDMARK RIGHT  02/09/2022  ?10 cm proximal to olecranon process 38.3  ?Olecranon process 28.4  ?10 cm proximal to ulnar styloid process 24.6  ?Just proximal to ulnar styloid process 18.6  ?Across hand at thumb web  space 22.0  ?At base of 2nd digit 6.9  ?(Blank rows = not tested) ? ?LANDMARK LEFT  02/09/2022  ?10 cm proximal to olecranon process 38.6  ?Olecranon process 28.3  ?10 cm proximal to ulnar styloid process 24.5  ?Just proximal to ulnar styloid process 18.3  ?Across hand at thumb web space 21.0  ?At base of 2nd digit 6.7  ?(Blank rows = not tested) ? ? ?L-DEX LYMPHEDEMA SCREENING: ? ?The patient was assessed using the L-Dex machine today to produce a lymphedema index baseline score. The patient will be reassessed on a regular basis (typically every 3 months) to obtain new L-Dex scores. If the score is > 6.5 points away from his/her baseline score indicating onset of subclinical lymphedema, it will be recommended to wear a compression garment for 4 weeks, 12 hours per day and then be reassessed. If the score continues to be > 6.5 points from baseline at reassessment, we will initiate lymphedema treatment. Assessing in this manner has a 95% rate of preventing clinically significant lymphedema. ? ? L-DEX FLOWSHEETS - 02/09/22 1400   ? ?  ? L-DEX LYMPHEDEMA SCREENING  ? Measurement Type Unilateral   ? L-DEX MEASUREMENT EXTREMITY Upper Extremity   ? POSITION  Standing   ? DOMINANT SIDE Right   ? At Risk Side Left   ? BASELINE SCORE (UNILATERAL) 2.4   ? ?  ?  ? ?  ? ? ? ?QUICK DASH SURVEY:0% ? ? ?PATIENT EDUCATION:  ?Education details: Lymphedema risk reduction and post op shoulder/posture HEP, ABC class, SOZO screens ?Person educated: Patient ?Education method: Explanation, Demonstration, Handout ?Education comprehension: Patient verbalized understanding and returned demonstration ? ? ?HOME EXERCISE PROGRAM: ?Patient was instructed today in a home exercise program today for post op shoulder range of motion. These included active assist shoulder flexion in sitting/supine, scapular retraction, wall walking with shoulder abduction, and hands behind head external rotation in sitting/supine.  She was encouraged to do these twice  a day, holding 3 seconds and repeating 5 times when permitted by her physician. ? ? ?ASSESSMENT: ? ?CLINICAL IMPRESSION: ?Pts. multidisciplinary medical team met prior to her assessments to determine a re

## 2022-02-09 NOTE — Assessment & Plan Note (Addendum)
01/22/2022: Work-up performed for intermittent bloody left nipple discharge.  Mammogram and ultrasound revealed bilateral breast masses  ?Right breast 2 hypoechoic masses 1.6 cm and 0.6 cm.  No axillary lymph nodes.  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 ?Pathology review: I discussed with the patient the difference between DCIS and invasive breast cancer. It is considered a precancerous lesion. DCIS is classified as a 0. It is generally detected through mammograms as calcifications. We discussed the significance of grades and its impact on prognosis. We also discussed the importance of ER and PR receptors and HER2 and their implications to adjuvant treatment options. Prognosis of IDC and DCIS dependence on grade, comedo necrosis. ? ?Recommendation: ?1.  Bilateral lumpectomies along with left sentinel lymph node biopsy ?2. Oncotype DX testing ?3. Followed by adjuvant radiation therapy ?4. Followed by antiestrogen therapy with tamoxifen ?10 years ?Genetic testing ? ?Tamoxifen counseling: We discussed the risks and benefits of tamoxifen. These include but not limited to insomnia, hot flashes, mood changes, vaginal dryness, and weight gain. Although rare, serious side effects including endometrial cancer, risk of blood clots were also discussed. We strongly believe that the benefits far outweigh the risks. Patient understands these risks and consented to starting treatment. Planned treatment duration is 5 years. ? ?Return to clinic after surgery to discuss the final pathology report and come up with an adjuvant treatment plan. ? ? ?

## 2022-02-11 NOTE — Progress Notes (Signed)
Location of Breast Cancer:  ?Malignant neoplasm of upper-outer quadrant of left breast in female, estrogen receptor positive  ?Ductal carcinoma in situ (DCIS) of right breast  ? ?Histology per Pathology Report:  ?01/22/2022 ?1. Breast, right, needle core biopsy, medial mid, ribbon clip ?- DUCTAL CARCINOMA IN SITU WITH CALCIFICATIONS AND NECROSIS ?- SEE COMMENT ?2. Breast, right, needle core biopsy, lower inner, coil clip ?- DUCTAL CARCINOMA IN SITU WITH CALCIFICATIONS AND NECROSIS ?- SEE COMMENT ? ?01/13/2022 ?FINAL MICROSCOPIC DIAGNOSIS:  ?A. BREAST, 10:00 MASS, LEFT, BIOPSY:  ?- Invasive ductal carcinoma, grade 2/3.  ?- See comment.  ?B. BREAST, 10:00, MASS, LEFT, BIOPSY:  ?- Invasive ductal carcinoma, grade 2/3.  ?- See comment.  ?COMMENT:  ?The greatest tumor dimension is 1.6 cm.  Breast prognostic profile will be performed. ? ?Receptor Status:  ?Right breast (medial mid): ER(100%), PR (70%)  ?Right breast (lower inner): ER(100%), PR (90%) ? ?Left breast (10 o'clock): ER(60%), PR (100%), Her2-neu (Negative), Ki-67(1%) ? ?Did patient present with symptoms (if so, please note symptoms) or was this found on screening mammography?: Patient complained of bloody nipple discharge. Underwent mammograms on 01/06/2022, which showed 2 suspicious left breast masses 1.6 cm and 0.6 cm.  Ieft also showed right breast calcifications measuring 0.7 cm. ? ?Past/Anticipated interventions by surgeon, if any:  ?03/04/2022 ?--Dr. Rolm Bookbinder ?BILATERAL BRACKETED BREAST LUMPECTOMY WITH RADIOACTIVE SEED LOCALIZATION ?LEFT AXILLARY SENTINEL NODE BIOPSY ? ?02/05/2022 ?--Dr. Rolm Bookbinder (office visit) ?Likely bilateral bracketed lumpectomies,  ?left ax sn biopsy,  ?magtrace injection on right side ?Sozo & preop bra  ?Genetics ?We discussed the staging and pathophysiology of breast cancer.  ?We discussed all of the different options for treatment for breast cancer including: surgery, chemotherapy, radiation therapy, Herceptin, and  antiestrogen therapy. ?Ideally with a multiplicity of findings well as the breast density I would obtain an MRI.  ?However with 1 transplanted kidney and an abnormal creatinine I do not think this is an option. ?She understands her further therapy will be based on what her stages at the time of her operation. ?Will await her genetics and then proceed with surgery ? ?Past/Anticipated interventions by medical oncology, if any:  ?Under care of Dr. Nicholas Lose ?02/09/2022 ?--Recommendation: ?Bilateral lumpectomies along with left sentinel lymph node biopsy ?Oncotype DX testing ?Followed by adjuvant radiation therapy ?Followed by antiestrogen therapy with tamoxifen ?10 years ?Genetic testing ?--Return to clinic after surgery to discuss the final pathology report and come up with an adjuvant treatment plan. ? ?Lymphedema issues, if any:  None   ? ?Pain issues, if any:  Patient denies  ? ?SAFETY ISSUES: ?Prior radiation? No ?Pacemaker/ICD? No ?Possible current pregnancy? No--reports her menstrual cycle did not resume after her ablation  ?Is the patient on methotrexate? No ? ?Current Complaints / other details:   ?She is working at [employer] and plans to take short-term disability.  Her husband is a Printmaker. ?She has a daughter and a son.   ?Daughter is currently working and son is currently at home.   ? ? ? ? ?

## 2022-02-11 NOTE — Progress Notes (Signed)
?Radiation Oncology         [phone] ?________________________________ ? ?Initial Outpatient Consultation ? ?Name: Raven Walker MRN: [id number]  ?Date: 02/12/2022  DOB: Sep 07, 1981 ? ?MV:[facility], Edwinna Areola, MD  Rolm Bookbinder, MD  ? ?REFERRING PHYSICIAN: Rolm Bookbinder, MD ? ?DIAGNOSIS: The primary encounter diagnosis was Malignant neoplasm of upper-outer quadrant of left breast in female, estrogen receptor positive ([place]). A diagnosis of Ductal carcinoma in situ (DCIS) of right breast was also pertinent to this visit. ? 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 ?Stage 0 (cTis (DCIS), cN0, cM0) Right Breast, Intermediate and High-grade DCIS, ER+ / PR+ / Her2 not assessed ? ? ?HISTORY OF PRESENT ILLNESS::Raven Walker is a 41 y.o. female who is  seen as a courtesy of Dr. Donne Hazel for an opinion concerning radiation therapy as part of management for her recently diagnosed left breast cancer and right breast DCIS.  ? ?The patient initially presented with intermittent bloody left nipple discharge this past April (2023). Subsequent diagnostic bilateral mammogram and left breast ultrasound on 01/06/22 revealed 2 irregular, hypoechoic masses in the left breast with associated vascularity at the 10 o'clock position 3 cmfn, and 10 o'clock position 5 cmfn; measuring 16 x 10 x 9 mm and 6 x 5 x 6 mm respectively. Imaging also revealed a segmental distribution of suspicious, coarse heterogeneous calcifications in the medial right breast spanning up to 7 mm. No suspicious left axillary lymphadenopathy was appreciated.  ? ?Biopsies of the two 10 o'clock left breast masses on 01/13/22 revealed grade 2 invasive ductal carcinoma measuring 1.6 cm in the greatest tumor dimension. Prognostic indicators significant for: estrogen receptor 60% positive with moderate staining intensity; progesterone receptor 100% positive with strong  staining intensity; proliferation marker Ki67 at 1%; Her2 status negative.  ? ?Biopsies of the medial and lower inner right breast on 01/22/22 revealed: intermediate-high grade DCIS with calcifications and necrosis in the medial breast measuring 0.3 cm in the greatest linear extent, and high grade DCIS with calcifications and necrosis in lower inner breast measuring 0.4 cm in the greatest linear extent. Prognostic indicators for the medial breast significant for: estrogen receptor 100% positive with strong staining intensity; progesterone receptor 70% positive with moderate staining intensity. Prognostic indicators for the lower inner breast significant for: estrogen receptor 100% positive; progesterone receptor 90% positive, both with strong staining intensity.   ? ?Accordingly, the patient was referred to Dr. Donne Hazel on 02/05/22 to discuss treatment options. Given the volume of findings in both breasts and her breast density, Dr. Donne Hazel would typically proceed with an MRI for further staging and evaluation. However, the patient has 1 transplanted kidney and abnormal creatinine which prevents her from obtaining an MRI. With this in consideration, Dr. Donne Hazel placed a referral for genetics for further staging purposes. Pending results from genetic testing, the plan is for the patient to proceed with bilateral bracketed lumpectomies, and left SLN biopsies. In addition to genetics, the patient was also given a pre-op bra and set up for SOZO.  ? ?The patient recently met with Dr. Lindi Adie on 02/09/22 who recommended Oncotype testing, and tamoxifen x 10 years following XRT. The patient will return to Dr. Lindi Adie following breast conserving surgery to review Oncotype results and construct a definitive adjuvant treatment plan.    ? ? ?PREVIOUS RADIATION THERAPY: No ? ?PAST MEDICAL HISTORY:  ?Past Medical History:  ?Diagnosis Date  ? Anemia   ? history  ?  BV (bacterial vaginosis) 06/04/2014  ? ESRD on peritoneal  dialysis [facility]) 2008  ? pt had kidney transplant 06/3012 and is no longer on dialysis  ? GERD (gastroesophageal reflux disease) Dec. 2010  ? ulcerative esophagitis per EGD   ? Hematuria 07/02/2014  ? Hypertension   ? Poor appetite   ? History  ? Secondary hyperparathyroidism ([place])   ? s/p parathyroidectomy with autotransplantation to left arm per Dr. Baker Pierini on 08/04/11  ? Streptococcal peritonitis [facility])  Sept. 2012  ? SVD (spontaneous vaginal delivery)   ? x 1  ? Urinary frequency 07/02/2014  ? UTI (lower urinary tract infection) 07/02/2014  ? Vaginal discharge 06/04/2014  ? ? ?PAST SURGICAL HISTORY: ?Past Surgical History:  ?Procedure Laterality Date  ? arm graphs  2008  ? three - done at [facility]; still in but never used because pt got a transplant.  ? DILATATION & CURETTAGE/HYSTEROSCOPY WITH TRUECLEAR N/A 04/16/2014  ? Procedure: DILATATION & CURETTAGE/HYSTEROSCOPY WITH TRUCLEAR AND RESECTION OF FIBROID/THERMACHOICE ABLATION;  Surgeon: Jonnie Kind, MD;  Location: [facility];  Service: Gynecology;  Laterality: N/A;  ? KIDNEY TRANSPLANT Right 2013  ? [facility]  ? PARATHYROIDECTOMY  08/05/11  ? with graft of parathyroid into left upper arm.  ? WISDOM TOOTH EXTRACTION    ? ? ?FAMILY HISTORY:  ?Family History  ?Problem Relation Age of Onset  ? Hypertension Mother   ? Hypertension Father   ? Obesity Son   ? Cancer Maternal Uncle   ? Diabetes Maternal Grandmother   ? Stroke Maternal Grandfather   ? ? ?SOCIAL HISTORY:  ?Social History  ? ?Tobacco Use  ? Smoking status: Former  ?  Packs/day: 0.25  ?  Years: 2.00  ?  Pack years: 0.50  ?  Types: Cigarettes  ?  Quit date: 10/06/1999  ?  Years since quitting: 22.3  ? Smokeless tobacco: Never  ?Vaping Use  ? Vaping Use: Never used  ?Substance Use Topics  ? Alcohol use: Yes  ?  Comment: Once per month  ? Drug use: No  ? ? ?ALLERGIES:  ?Allergies  ?Allergen Reactions  ? Nickel Itching and Rash  ?  Only where touched  ? ? ?MEDICATIONS:  ?Current Outpatient Medications   ?Medication Sig Dispense Refill  ? pantoprazole (PROTONIX) 40 MG tablet Take 1 tablet by mouth Walker as needed.    ? Acetaminophen 500 MG coapsule Take 500-1,000 mg by mouth every 6 (six) hours as needed for pain.     ? calcitRIOL (ROCALTROL) 0.25 MCG capsule Take by mouth.    ? calcium carbonate (TUMS - DOSED IN MG ELEMENTAL CALCIUM) 500 MG chewable tablet Chew 3 tablets by mouth 3 (three) times Walker.    ? Cholecalciferol 1000 UNITS tablet Take 1,000 Units by mouth Walker. (Patient not taking: Reported on 02/09/2022)    ? diltiazem (CARDIZEM) 30 MG tablet Take 30 mg by mouth 2 (two) times Walker.    ? furosemide (LASIX) 40 MG tablet Take 40 mg by mouth Walker.    ? Iron-FA-B Cmp-C-Biot-Probiotic (FUSION PLUS) CAPS Take 1 capsule by mouth Walker.    ? magnesium oxide (MAG-OX) 400 (240 Mg) MG tablet Take 1 tablet by mouth 2 (two) times Walker.    ? metoprolol tartrate (LOPRESSOR) 50 MG tablet Take 50 mg by mouth 2 (two) times Walker.    ? mycophenolate (CELLCEPT) 250 MG capsule Take 750 mg by mouth 2 (two) times Walker.     ? tacrolimus (PROGRAF) 1  MG capsule Take 3 capsules (3 mg total) by mouth 2 (two) times Walker.    ? ?No current facility-administered medications for this encounter.  ? ? ?REVIEW OF SYSTEMS:  A 10+ POINT REVIEW OF SYSTEMS WAS OBTAINED including neurology, dermatology, psychiatry, cardiac, respiratory, lymph, extremities, GI, GU, musculoskeletal, constitutional, reproductive, HEENT.  She reported mild bloody left nipple discharge at night.  She denies any pain with in either breast prior to biopsy.  She denies any problems with swelling in her left arm or hand. ?  ?PHYSICAL EXAM:  height is 5' 9.5" (1.765 m) and weight is 228 lb 6 oz (103.6 kg). Her temporal temperature is 97 ?F (36.1 ?C) (abnormal). Her blood pressure is 133/98 (abnormal) and her pulse is 62. Her respiration is 18 and oxygen saturation is 100%.   ?General: Alert and oriented, in no acute distress ?HEENT: Head is normocephalic.  Extraocular movements are intact.  ?Neck: Neck is supple, no palpable cervical or supraclavicular lymphadenopathy. ?Heart: Regular in rate and rhythm with no murmurs, rubs, or gallops. ?Chest: Clear to auscultation bilaterally, with

## 2022-02-12 ENCOUNTER — Other Ambulatory Visit: Payer: Self-pay

## 2022-02-12 ENCOUNTER — Encounter: Payer: Self-pay | Admitting: Radiation Oncology

## 2022-02-12 ENCOUNTER — Encounter: Payer: Self-pay | Admitting: *Deleted

## 2022-02-12 ENCOUNTER — Ambulatory Visit
Admission: RE | Admit: 2022-02-12 | Discharge: 2022-02-12 | Disposition: A | Discharge status: Home / Self Care | Payer: 59 | Source: Ambulatory Visit | Attending: Radiation Oncology | Admitting: Radiation Oncology

## 2022-02-12 ENCOUNTER — Telehealth: Payer: Self-pay | Admitting: *Deleted

## 2022-02-12 VITALS — BP 133/98 | HR 62 | Temp 97.0°F | Resp 18 | Ht 69.5 in | Wt 228.4 lb

## 2022-02-12 DIAGNOSIS — I120 Hypertensive chronic kidney disease with stage 5 chronic kidney disease or end stage renal disease: Secondary | ICD-10-CM | POA: Insufficient documentation

## 2022-02-12 DIAGNOSIS — Z79899 Other long term (current) drug therapy: Secondary | ICD-10-CM | POA: Diagnosis not present

## 2022-02-12 DIAGNOSIS — Z170 Estrogen receptor positive status [ER+]: Secondary | ICD-10-CM | POA: Diagnosis not present

## 2022-02-12 DIAGNOSIS — D631 Anemia in chronic kidney disease: Secondary | ICD-10-CM | POA: Diagnosis not present

## 2022-02-12 DIAGNOSIS — Z7969 Long term (current) use of other immunomodulators and immunosuppressants: Secondary | ICD-10-CM | POA: Insufficient documentation

## 2022-02-12 DIAGNOSIS — D0511 Intraductal carcinoma in situ of right breast: Secondary | ICD-10-CM

## 2022-02-12 DIAGNOSIS — C50412 Malignant neoplasm of upper-outer quadrant of left female breast: Secondary | ICD-10-CM | POA: Insufficient documentation

## 2022-02-12 DIAGNOSIS — N390 Urinary tract infection, site not specified: Secondary | ICD-10-CM | POA: Diagnosis not present

## 2022-02-12 DIAGNOSIS — N186 End stage renal disease: Secondary | ICD-10-CM | POA: Diagnosis not present

## 2022-02-12 DIAGNOSIS — K219 Gastro-esophageal reflux disease without esophagitis: Secondary | ICD-10-CM | POA: Diagnosis not present

## 2022-02-12 DIAGNOSIS — Z87891 Personal history of nicotine dependence: Secondary | ICD-10-CM | POA: Diagnosis not present

## 2022-02-12 DIAGNOSIS — N2581 Secondary hyperparathyroidism of renal origin: Secondary | ICD-10-CM | POA: Insufficient documentation

## 2022-02-12 DIAGNOSIS — Z809 Family history of malignant neoplasm, unspecified: Secondary | ICD-10-CM | POA: Diagnosis not present

## 2022-02-12 NOTE — Telephone Encounter (Signed)
Spoke to pt, provided navigation resources and contact information. Denies questions or concerns regarding dx or treatment care plan. Scheduled and confirmed appt with Dr. Lindi Adie post op on 6/19 at 10:45. Encourage pt to call with needs. Received verbal understanding. ?

## 2022-02-13 ENCOUNTER — Other Ambulatory Visit: Payer: Self-pay | Admitting: General Surgery

## 2022-02-13 DIAGNOSIS — Z170 Estrogen receptor positive status [ER+]: Secondary | ICD-10-CM

## 2022-02-13 DIAGNOSIS — D0511 Intraductal carcinoma in situ of right breast: Secondary | ICD-10-CM

## 2022-02-19 ENCOUNTER — Encounter: Payer: Self-pay | Admitting: Licensed Clinical Social Worker

## 2022-02-19 NOTE — Progress Notes (Signed)
[facility] Work  Clinical Social Work was referred by  new pt protocol  for assessment of psychosocial needs.  Clinical Social Worker attempted to contact patient by phone  to offer support and assess for needs.   No answer. Left VM with direct contact information.      Veto Macqueen E Avinash Maltos, LCSW  Clinical Social Worker [place]        Patient is participating in a Managed Medicaid Plan:  No

## 2022-02-19 NOTE — Progress Notes (Signed)
Surgical Instructions    Your procedure is scheduled on Wednesday, May 31st, 2023.   Report to [facility] Main Entrance "A" at 05:30 A.M., then check in with the Admitting office.  Call this number if you have problems the morning of surgery:  [phone]   If you have any questions prior to your surgery date call [phone]: Open Monday-Friday 8am-4pm    Remember:  Do not eat after midnight the night before your surgery  You may drink clear liquids until 04:30 the morning of your surgery.   Clear liquids allowed are: Water, Non-Citrus Juices (without pulp), Carbonated Beverages, Clear Tea, Black Coffee ONLY (NO MILK, CREAM OR POWDERED CREAMER of any kind), and Gatorade  Patient Instructions  The night before surgery:  No food after midnight. ONLY clear liquids after midnight  The day of surgery (if you do NOT have diabetes):  Drink ONE (1) Pre-Surgery Clear Ensure by 04:30 the morning of surgery. Drink in one sitting. Do not sip.  This drink was given to you during your hospital  pre-op appointment visit.  Nothing else to drink after completing the  Pre-Surgery Clear Ensure.         If you have questions, please contact your surgeon's office.     Take these medicines the morning of surgery with A SIP OF WATER:   diltiazem (CARDIZEM) metoprolol tartrate (LOPRESSOR) mycophenolate (CELLCEPT) tacrolimus (PROGRAF)  If needed:  acetaminophen (TYLENOL) pantoprazole (PROTONIX)  As of today, STOP taking any Aspirin (unless otherwise instructed by your surgeon) Aleve, Naproxen, Ibuprofen, Motrin, Advil, Goody's, BC's, all herbal medications, fish oil, and all vitamins.    The day of surgery:          Do not wear jewelry or makeup Do not wear lotions, powders, perfumes or deodorant. Do not shave 48 hours prior to surgery.   Do not bring valuables to the hospital. Do not wear nail polish, gel polish, artificial nails, or any other type of covering on natural nails  (fingers and toes) If you have artificial nails or gel coating that need to be removed by a nail salon, please have this removed prior to surgery. Artificial nails or gel coating may interfere with anesthesia's ability to adequately monitor your vital signs.  [facility] is not responsible for any belongings or valuables. .   Do NOT Smoke (Tobacco/Vaping)  24 hours prior to your procedure  If you use a CPAP at night, you may bring your mask for your overnight stay.   Contacts, glasses, hearing aids, dentures or partials may not be worn into surgery, please bring cases for these belongings   For patients admitted to the hospital, discharge time will be determined by your treatment team.   Patients discharged the day of surgery will not be allowed to drive home, and someone needs to stay with them for 24 hours.   SURGICAL WAITING ROOM VISITATION Patients having surgery or a procedure in a hospital may have two support people. Children under the age of 34 must have an adult with them who is not the patient. They may stay in the waiting area during the procedure and may switch out with other visitors. If the patient needs to stay at the hospital during part of their recovery, the visitor guidelines for inpatient rooms apply.  Please refer to the [facility] website for the visitor guidelines for Inpatients (after your surgery is over and you are in a regular room).    Special instructions:    Oral  Hygiene is also important to reduce your risk of infection.  Remember - BRUSH YOUR TEETH THE MORNING OF SURGERY WITH YOUR REGULAR TOOTHPASTE   [facility]- Preparing For Surgery  Before surgery, you can play an important role. Because skin is not sterile, your skin needs to be as free of germs as possible. You can reduce the number of germs on your skin by washing with CHG (chlorahexidine gluconate) Soap before surgery.  CHG is an antiseptic cleaner which kills germs and bonds with the skin to  continue killing germs even after washing.     Please do not use if you have an allergy to CHG or antibacterial soaps. If your skin becomes reddened/irritated stop using the CHG.  Do not shave (including legs and underarms) for at least 48 hours prior to first CHG shower. It is OK to shave your face.  Please follow these instructions carefully.     Shower the NIGHT BEFORE SURGERY and the MORNING OF SURGERY with CHG Soap.   If you chose to wash your hair, wash your hair first as usual with your normal shampoo. After you shampoo, rinse your hair and body thoroughly to remove the shampoo.  Then [employer] and genitals (private parts) with your normal soap and rinse thoroughly to remove soap.  After that Use CHG Soap as you would any other liquid soap. You can apply CHG directly to the skin and wash gently with a scrungie or a clean washcloth.   Apply the CHG Soap to your body ONLY FROM THE NECK DOWN.  Do not use on open wounds or open sores. Avoid contact with your eyes, ears, mouth and genitals (private parts). Wash Face and genitals (private parts)  with your normal soap.   Wash thoroughly, paying special attention to the area where your surgery will be performed.  Thoroughly rinse your body with warm water from the neck down.  DO NOT shower/wash with your normal soap after using and rinsing off the CHG Soap.  Pat yourself dry with a CLEAN TOWEL.  Wear CLEAN PAJAMAS to bed the night before surgery  Place CLEAN SHEETS on your bed the night before your surgery  DO NOT SLEEP WITH PETS.   Day of Surgery:  Take a shower with CHG soap. Wear Clean/Comfortable clothing the morning of surgery Do not apply any deodorants/lotions.   Remember to brush your teeth WITH YOUR REGULAR TOOTHPASTE.    If you received a COVID test during your pre-op visit, it is requested that you wear a mask when out in public, stay away from anyone that may not be feeling well, and notify your surgeon if you  develop symptoms. If you have been in contact with anyone that has tested positive in the last 10 days, please notify your surgeon.    Please read over the following fact sheets that you were given.

## 2022-02-20 ENCOUNTER — Encounter (HOSPITAL_COMMUNITY): Payer: Self-pay

## 2022-02-20 ENCOUNTER — Other Ambulatory Visit: Payer: Self-pay

## 2022-02-20 ENCOUNTER — Encounter (HOSPITAL_COMMUNITY)
Admission: RE | Admit: 2022-02-20 | Discharge: 2022-02-20 | Disposition: A | Discharge status: Home / Self Care | Payer: 59 | Source: Ambulatory Visit | Attending: General Surgery | Admitting: General Surgery

## 2022-02-20 VITALS — BP 155/99 | HR 70 | Temp 98.2°F | Resp 18 | Ht 69.0 in | Wt 226.3 lb

## 2022-02-20 DIAGNOSIS — Z01818 Encounter for other preprocedural examination: Secondary | ICD-10-CM | POA: Diagnosis present

## 2022-02-20 DIAGNOSIS — I10 Essential (primary) hypertension: Secondary | ICD-10-CM

## 2022-02-20 LAB — BASIC METABOLIC PANEL
Anion gap: 12 (ref 5–15)
BUN: 21 mg/dL — ABNORMAL HIGH (ref 6–20)
CO2: 24 mmol/L (ref 22–32)
Calcium: 8.9 mg/dL (ref 8.9–10.3)
Chloride: 107 mmol/L (ref 98–111)
Creatinine, Ser: 1.44 mg/dL — ABNORMAL HIGH (ref 0.44–1.00)
GFR, Estimated: 46 mL/min — ABNORMAL LOW (ref >60)
Glucose, Bld: 101 mg/dL — ABNORMAL HIGH (ref 70–99)
Potassium: 3 mmol/L — ABNORMAL LOW (ref 3.5–5.1)
Sodium: 143 mmol/L (ref 135–145)

## 2022-02-20 LAB — CBC
HCT: 32.8 % — ABNORMAL LOW (ref 36.0–46.0)
Hemoglobin: 10.7 g/dL — ABNORMAL LOW (ref 12.0–15.0)
MCH: 27 pg (ref 26.0–34.0)
MCHC: 32.6 g/dL (ref 30.0–36.0)
MCV: 82.6 fL (ref 80.0–100.0)
Platelets: 227 10*3/uL (ref 150–400)
RBC: 3.97 MIL/uL (ref 3.87–5.11)
RDW: 13.2 % (ref 11.5–15.5)
WBC: 5.6 10*3/uL (ref 4.0–10.5)
nRBC: 0 % (ref 0.0–0.2)

## 2022-02-20 LAB — POCT PREGNANCY, URINE: Preg Test, Ur: NEGATIVE

## 2022-02-20 LAB — PREGNANCY, URINE: Preg Test, Ur: NEGATIVE

## 2022-03-03 ENCOUNTER — Ambulatory Visit
Admission: RE | Admit: 2022-03-03 | Discharge: 2022-03-03 | Disposition: A | Discharge status: Home / Self Care | Payer: 59 | Source: Ambulatory Visit | Attending: General Surgery | Admitting: General Surgery

## 2022-03-03 DIAGNOSIS — C50412 Malignant neoplasm of upper-outer quadrant of left female breast: Secondary | ICD-10-CM

## 2022-03-03 DIAGNOSIS — D0511 Intraductal carcinoma in situ of right breast: Secondary | ICD-10-CM

## 2022-03-03 NOTE — Anesthesia Preprocedure Evaluation (Addendum)
Anesthesia Evaluation  Patient identified by MRN, date of birth, ID band Patient awake    Reviewed: Allergy & Precautions, NPO status , Patient's Chart, lab work & pertinent test results, reviewed documented beta blocker date and time   History of Anesthesia Complications Negative for: history of anesthetic complications  Airway Mallampati: II  TM Distance: >3 FB Neck ROM: Full    Dental no notable dental hx. (+) Dental Advisory Given   Pulmonary neg pulmonary ROS, former smoker,    Pulmonary exam normal        Cardiovascular hypertension, Pt. on home beta blockers and Pt. on medications Normal cardiovascular exam     Neuro/Psych negative neurological ROS     GI/Hepatic Neg liver ROS, GERD  Medicated,  Endo/Other  negative endocrine ROS  Renal/GU S/p renal tx      Musculoskeletal negative musculoskeletal ROS (+)   Abdominal   Peds  Hematology  (+) Blood dyscrasia, anemia ,   Anesthesia Other Findings   Reproductive/Obstetrics                            Anesthesia Physical Anesthesia Plan  ASA: 3  Anesthesia Plan: General   Post-op Pain Management: Regional block* and Tylenol PO (pre-op)*   Induction: Intravenous  PONV Risk Score and Plan: 4 or greater and Ondansetron, Dexamethasone, Midazolam and Scopolamine patch - Pre-op  Airway Management Planned: Oral ETT  Additional Equipment:   Intra-op Plan:   Post-operative Plan: Extubation in OR  Informed Consent: I have reviewed the patients History and Physical, chart, labs and discussed the procedure including the risks, benefits and alternatives for the proposed anesthesia with the patient or authorized representative who has indicated his/her understanding and acceptance.     Dental advisory given  Plan Discussed with: Anesthesiologist and CRNA  Anesthesia Plan Comments:        Anesthesia Quick Evaluation

## 2022-03-04 ENCOUNTER — Other Ambulatory Visit: Payer: Self-pay

## 2022-03-04 ENCOUNTER — Encounter (HOSPITAL_COMMUNITY): Payer: Self-pay | Admitting: General Surgery

## 2022-03-04 ENCOUNTER — Encounter (HOSPITAL_COMMUNITY)
Admission: RE | Disposition: A | Discharge status: Home / Self Care | Payer: Self-pay | Source: Home / Self Care | Attending: General Surgery

## 2022-03-04 ENCOUNTER — Ambulatory Visit (HOSPITAL_COMMUNITY)
Admission: RE | Admit: 2022-03-04 | Discharge: 2022-03-04 | Disposition: A | Discharge status: Home / Self Care | Payer: 59 | Attending: General Surgery | Admitting: General Surgery

## 2022-03-04 ENCOUNTER — Ambulatory Visit (HOSPITAL_COMMUNITY): Payer: 59 | Admitting: Anesthesiology

## 2022-03-04 ENCOUNTER — Ambulatory Visit
Admission: RE | Admit: 2022-03-04 | Discharge: 2022-03-04 | Disposition: A | Discharge status: Home / Self Care | Payer: 59 | Source: Ambulatory Visit | Attending: General Surgery | Admitting: General Surgery

## 2022-03-04 ENCOUNTER — Ambulatory Visit (HOSPITAL_BASED_OUTPATIENT_CLINIC_OR_DEPARTMENT_OTHER): Payer: 59 | Admitting: Anesthesiology

## 2022-03-04 DIAGNOSIS — D649 Anemia, unspecified: Secondary | ICD-10-CM

## 2022-03-04 DIAGNOSIS — I10 Essential (primary) hypertension: Secondary | ICD-10-CM

## 2022-03-04 DIAGNOSIS — D0511 Intraductal carcinoma in situ of right breast: Secondary | ICD-10-CM

## 2022-03-04 DIAGNOSIS — Q600 Renal agenesis, unilateral: Secondary | ICD-10-CM | POA: Insufficient documentation

## 2022-03-04 DIAGNOSIS — C50912 Malignant neoplasm of unspecified site of left female breast: Secondary | ICD-10-CM | POA: Diagnosis not present

## 2022-03-04 DIAGNOSIS — Z940 Kidney transplant status: Secondary | ICD-10-CM | POA: Insufficient documentation

## 2022-03-04 DIAGNOSIS — C50911 Malignant neoplasm of unspecified site of right female breast: Secondary | ICD-10-CM

## 2022-03-04 DIAGNOSIS — N186 End stage renal disease: Secondary | ICD-10-CM | POA: Diagnosis not present

## 2022-03-04 DIAGNOSIS — Z170 Estrogen receptor positive status [ER+]: Secondary | ICD-10-CM

## 2022-03-04 DIAGNOSIS — K219 Gastro-esophageal reflux disease without esophagitis: Secondary | ICD-10-CM | POA: Insufficient documentation

## 2022-03-04 DIAGNOSIS — C50411 Malignant neoplasm of upper-outer quadrant of right female breast: Secondary | ICD-10-CM | POA: Insufficient documentation

## 2022-03-04 DIAGNOSIS — C50412 Malignant neoplasm of upper-outer quadrant of left female breast: Secondary | ICD-10-CM | POA: Insufficient documentation

## 2022-03-04 DIAGNOSIS — I120 Hypertensive chronic kidney disease with stage 5 chronic kidney disease or end stage renal disease: Secondary | ICD-10-CM | POA: Diagnosis not present

## 2022-03-04 HISTORY — PX: AXILLARY SENTINEL NODE BIOPSY: SHX5738

## 2022-03-04 HISTORY — PX: BREAST LUMPECTOMY WITH RADIOACTIVE SEED LOCALIZATION: SHX6424

## 2022-03-04 LAB — POCT PREGNANCY, URINE: Preg Test, Ur: NEGATIVE

## 2022-03-04 SURGERY — BREAST LUMPECTOMY WITH RADIOACTIVE SEED LOCALIZATION
Anesthesia: General | Site: Breast | Laterality: Left

## 2022-03-04 MED ORDER — AMISULPRIDE (ANTIEMETIC) 5 MG/2ML IV SOLN
10.0000 mg | Freq: Once | INTRAVENOUS | Status: AC | PRN
  Administered 2022-03-04: 10 mg via INTRAVENOUS

## 2022-03-04 MED ORDER — DEXAMETHASONE SODIUM PHOSPHATE 10 MG/ML IJ SOLN
INTRAMUSCULAR | Status: DC | PRN
  Administered 2022-03-04: 10 mg via INTRAVENOUS

## 2022-03-04 MED ORDER — HEMOSTATIC AGENTS (NO CHARGE) OPTIME
TOPICAL | Status: DC | PRN
  Administered 2022-03-04: 1 via TOPICAL

## 2022-03-04 MED ORDER — EPHEDRINE 5 MG/ML INJ
INTRAVENOUS | Status: AC
  Filled 2022-03-04: qty 5

## 2022-03-04 MED ORDER — ACETAMINOPHEN 500 MG PO TABS: 1000.0000 mg | ORAL_TABLET | Freq: Once | ORAL | Status: DC

## 2022-03-04 MED ORDER — FENTANYL CITRATE (PF) 100 MCG/2ML IJ SOLN: 100.0000 ug | Freq: Once | INTRAMUSCULAR | Status: AC

## 2022-03-04 MED ORDER — ACETAMINOPHEN 500 MG PO TABS: 1000.0000 mg | ORAL_TABLET | ORAL | Status: DC

## 2022-03-04 MED ORDER — SUGAMMADEX SODIUM 200 MG/2ML IV SOLN
INTRAVENOUS | Status: DC | PRN
  Administered 2022-03-04: 200 mg via INTRAVENOUS

## 2022-03-04 MED ORDER — AMISULPRIDE (ANTIEMETIC) 5 MG/2ML IV SOLN
INTRAVENOUS | Status: AC
  Filled 2022-03-04: qty 2

## 2022-03-04 MED ORDER — BUPIVACAINE HCL (PF) 0.25 % IJ SOLN
INTRAMUSCULAR | Status: DC | PRN
  Administered 2022-03-04: 20 mL

## 2022-03-04 MED ORDER — PHENYLEPHRINE 80 MCG/ML (10ML) SYRINGE FOR IV PUSH (FOR BLOOD PRESSURE SUPPORT)
PREFILLED_SYRINGE | INTRAVENOUS | Status: AC
  Filled 2022-03-04: qty 10

## 2022-03-04 MED ORDER — CHLORHEXIDINE GLUCONATE CLOTH 2 % EX PADS: 6.0000 | MEDICATED_PAD | Freq: Once | CUTANEOUS | Status: DC

## 2022-03-04 MED ORDER — MIDAZOLAM HCL 2 MG/2ML IJ SOLN
INTRAMUSCULAR | Status: AC
Start: 2022-03-04 — End: ?
  Filled 2022-03-04: qty 2

## 2022-03-04 MED ORDER — MIDAZOLAM HCL 2 MG/2ML IJ SOLN: 2.0000 mg | Freq: Once | INTRAMUSCULAR | Status: AC

## 2022-03-04 MED ORDER — OXYCODONE HCL 5 MG PO TABS
ORAL_TABLET | ORAL | Status: AC
  Filled 2022-03-04: qty 1

## 2022-03-04 MED ORDER — PHENYLEPHRINE 80 MCG/ML (10ML) SYRINGE FOR IV PUSH (FOR BLOOD PRESSURE SUPPORT)
PREFILLED_SYRINGE | INTRAVENOUS | Status: DC | PRN
  Administered 2022-03-04: 80 ug via INTRAVENOUS
  Administered 2022-03-04: 160 ug via INTRAVENOUS
  Administered 2022-03-04: 80 ug via INTRAVENOUS

## 2022-03-04 MED ORDER — CHLORHEXIDINE GLUCONATE 0.12 % MT SOLN
15.0000 mL | Freq: Once | OROMUCOSAL | Status: AC
  Administered 2022-03-04: 15 mL via OROMUCOSAL
  Filled 2022-03-04: qty 15

## 2022-03-04 MED ORDER — PROMETHAZINE HCL 25 MG/ML IJ SOLN: 6.2500 mg | INTRAMUSCULAR | Status: DC | PRN

## 2022-03-04 MED ORDER — LACTATED RINGERS IV SOLN: INTRAVENOUS | Status: DC

## 2022-03-04 MED ORDER — OXYCODONE HCL 5 MG PO TABS: 5.0000 mg | ORAL_TABLET | Freq: Four times a day (QID) | ORAL | 0 refills | Status: DC | PRN

## 2022-03-04 MED ORDER — LIDOCAINE 2% (20 MG/ML) 5 ML SYRINGE
INTRAMUSCULAR | Status: AC
  Filled 2022-03-04: qty 5

## 2022-03-04 MED ORDER — OXYCODONE HCL 5 MG PO TABS
5.0000 mg | ORAL_TABLET | Freq: Once | ORAL | Status: AC
  Administered 2022-03-04: 5 mg via ORAL

## 2022-03-04 MED ORDER — ONDANSETRON HCL 4 MG/2ML IJ SOLN
INTRAMUSCULAR | Status: AC
Start: 2022-03-04 — End: ?
  Filled 2022-03-04: qty 2

## 2022-03-04 MED ORDER — ENSURE PRE-SURGERY PO LIQD: 296.0000 mL | Freq: Once | ORAL | Status: DC

## 2022-03-04 MED ORDER — ONDANSETRON HCL 4 MG/2ML IJ SOLN
INTRAMUSCULAR | Status: DC | PRN
  Administered 2022-03-04: 4 mg via INTRAVENOUS

## 2022-03-04 MED ORDER — PROPOFOL 10 MG/ML IV BOLUS
INTRAVENOUS | Status: DC | PRN
  Administered 2022-03-04: 200 mg via INTRAVENOUS

## 2022-03-04 MED ORDER — ORAL CARE MOUTH RINSE: 15.0000 mL | Freq: Once | OROMUCOSAL | Status: AC

## 2022-03-04 MED ORDER — PROPOFOL 10 MG/ML IV BOLUS
INTRAVENOUS | Status: AC
  Filled 2022-03-04: qty 20

## 2022-03-04 MED ORDER — ROCURONIUM BROMIDE 10 MG/ML (PF) SYRINGE
PREFILLED_SYRINGE | INTRAVENOUS | Status: DC | PRN
  Administered 2022-03-04: 100 mg via INTRAVENOUS

## 2022-03-04 MED ORDER — MIDAZOLAM HCL 2 MG/2ML IJ SOLN
INTRAMUSCULAR | Status: AC
  Administered 2022-03-04: 2 mg via INTRAVENOUS
  Filled 2022-03-04: qty 2

## 2022-03-04 MED ORDER — CEFAZOLIN SODIUM-DEXTROSE 2-4 GM/100ML-% IV SOLN
2.0000 g | INTRAVENOUS | Status: AC
  Administered 2022-03-04: 2 g via INTRAVENOUS
  Filled 2022-03-04: qty 100

## 2022-03-04 MED ORDER — BUPIVACAINE LIPOSOME 1.3 % IJ SUSP
INTRAMUSCULAR | Status: DC | PRN
  Administered 2022-03-04: 10 mL

## 2022-03-04 MED ORDER — BUPIVACAINE-EPINEPHRINE (PF) 0.25% -1:200000 IJ SOLN
INTRAMUSCULAR | Status: AC
  Filled 2022-03-04: qty 30

## 2022-03-04 MED ORDER — SCOPOLAMINE 1 MG/3DAYS TD PT72
1.0000 | MEDICATED_PATCH | TRANSDERMAL | Status: DC
  Administered 2022-03-04: 1.5 mg via TRANSDERMAL
  Filled 2022-03-04: qty 1

## 2022-03-04 MED ORDER — 0.9 % SODIUM CHLORIDE (POUR BTL) OPTIME
TOPICAL | Status: DC | PRN
  Administered 2022-03-04: 1000 mL

## 2022-03-04 MED ORDER — FENTANYL CITRATE (PF) 100 MCG/2ML IJ SOLN
INTRAMUSCULAR | Status: AC
  Administered 2022-03-04: 100 ug via INTRAVENOUS
  Filled 2022-03-04: qty 2

## 2022-03-04 MED ORDER — BUPIVACAINE-EPINEPHRINE 0.25% -1:200000 IJ SOLN
INTRAMUSCULAR | Status: DC | PRN
  Administered 2022-03-04: 8 mL

## 2022-03-04 MED ORDER — DEXAMETHASONE SODIUM PHOSPHATE 10 MG/ML IJ SOLN
INTRAMUSCULAR | Status: AC
  Filled 2022-03-04: qty 1

## 2022-03-04 MED ORDER — MAGTRACE LYMPHATIC TRACER
INTRAMUSCULAR | Status: DC | PRN
  Administered 2022-03-04 (×2): 2 mL via INTRAMUSCULAR

## 2022-03-04 MED ORDER — FENTANYL CITRATE (PF) 100 MCG/2ML IJ SOLN: 25.0000 ug | INTRAMUSCULAR | Status: DC | PRN

## 2022-03-04 MED ORDER — FENTANYL CITRATE (PF) 250 MCG/5ML IJ SOLN
INTRAMUSCULAR | Status: DC | PRN
  Administered 2022-03-04 (×2): 50 ug via INTRAVENOUS

## 2022-03-04 MED ORDER — FENTANYL CITRATE (PF) 100 MCG/2ML IJ SOLN
INTRAMUSCULAR | Status: AC
  Filled 2022-03-04: qty 2

## 2022-03-04 MED ORDER — FENTANYL CITRATE (PF) 250 MCG/5ML IJ SOLN
INTRAMUSCULAR | Status: AC
  Filled 2022-03-04: qty 5

## 2022-03-04 MED ORDER — ROCURONIUM BROMIDE 10 MG/ML (PF) SYRINGE
PREFILLED_SYRINGE | INTRAVENOUS | Status: AC
Start: 2022-03-04 — End: ?
  Filled 2022-03-04: qty 10

## 2022-03-04 SURGICAL SUPPLY — 61 items
ADH SKN CLS APL DERMABOND .7 (GAUZE/BANDAGES/DRESSINGS) ×6
APL PRP STRL LF DISP 70% ISPRP (MISCELLANEOUS) ×2
APL SKNCLS STERI-STRIP NONHPOA (GAUZE/BANDAGES/DRESSINGS)
APPLIER CLIP 9.375 MED OPEN (MISCELLANEOUS) ×3
APR CLP MED 9.3 20 MLT OPN (MISCELLANEOUS) ×2
BENZOIN TINCTURE PRP APPL 2/3 (GAUZE/BANDAGES/DRESSINGS) IMPLANT
BINDER BREAST LRG (GAUZE/BANDAGES/DRESSINGS) IMPLANT
BINDER BREAST XLRG (GAUZE/BANDAGES/DRESSINGS) IMPLANT
CANISTER SUCT 3000ML PPV (MISCELLANEOUS) ×4 IMPLANT
CHLORAPREP W/TINT 26 (MISCELLANEOUS) ×4 IMPLANT
CLIP APPLIE 9.375 MED OPEN (MISCELLANEOUS) IMPLANT
CNTNR URN SCR LID CUP LEK RST (MISCELLANEOUS) ×3 IMPLANT
CONT SPEC 4OZ STRL OR WHT (MISCELLANEOUS) ×18
COVER PROBE W GEL 5X96 (DRAPES) ×5 IMPLANT
COVER SURGICAL LIGHT HANDLE (MISCELLANEOUS) ×4 IMPLANT
DECANTER SPIKE VIAL GLASS SM (MISCELLANEOUS) ×4 IMPLANT
DERMABOND ADVANCED (GAUZE/BANDAGES/DRESSINGS) ×3
DERMABOND ADVANCED .7 DNX12 (GAUZE/BANDAGES/DRESSINGS) ×3 IMPLANT
DEVICE DUBIN SPECIMEN MAMMOGRA (MISCELLANEOUS) ×5 IMPLANT
DRAPE ORTHO SPLIT 87X125 STRL (DRAPES) ×1 IMPLANT
DRAPE TOP SHEET (DRAPES) ×1 IMPLANT
ELECT COATED BLADE 2.86 ST (ELECTRODE) ×4 IMPLANT
ELECT REM PT RETURN 9FT ADLT (ELECTROSURGICAL) ×3
ELECTRODE REM PT RTRN 9FT ADLT (ELECTROSURGICAL) ×3 IMPLANT
GAUZE 4X4 16PLY ~~LOC~~+RFID DBL (SPONGE) ×4 IMPLANT
GAUZE SPONGE 4X4 12PLY STRL (GAUZE/BANDAGES/DRESSINGS) IMPLANT
GLOVE BIO SURGEON STRL SZ7 (GLOVE) ×8 IMPLANT
GLOVE BIOGEL PI IND STRL 7.5 (GLOVE) ×3 IMPLANT
GLOVE BIOGEL PI INDICATOR 7.5 (GLOVE) ×1
GOWN STRL REUS W/ TWL LRG LVL3 (GOWN DISPOSABLE) ×6 IMPLANT
GOWN STRL REUS W/TWL LRG LVL3 (GOWN DISPOSABLE) ×6
HEMOSTAT ARISTA ABSORB 3G PWDR (HEMOSTASIS) ×1 IMPLANT
KIT BASIN OR (CUSTOM PROCEDURE TRAY) ×4 IMPLANT
KIT MARKER MARGIN INK (KITS) ×4 IMPLANT
KIT TURNOVER KIT B (KITS) ×4 IMPLANT
NDL 18GX1X1/2 (RX/OR ONLY) (NEEDLE) IMPLANT
NDL FILTER BLUNT 18X1 1/2 (NEEDLE) IMPLANT
NDL HYPO 25GX1X1/2 BEV (NEEDLE) ×3 IMPLANT
NEEDLE 18GX1X1/2 (RX/OR ONLY) (NEEDLE) IMPLANT
NEEDLE FILTER BLUNT 18X 1/2SAF (NEEDLE)
NEEDLE FILTER BLUNT 18X1 1/2 (NEEDLE) IMPLANT
NEEDLE HYPO 25GX1X1/2 BEV (NEEDLE) ×3 IMPLANT
NS IRRIG 1000ML POUR BTL (IV SOLUTION) ×4 IMPLANT
PACK GENERAL/GYN (CUSTOM PROCEDURE TRAY) ×4 IMPLANT
PAD ARMBOARD 7.5X6 YLW CONV (MISCELLANEOUS) ×4 IMPLANT
RETRACTOR ONETRAX LX 90X20 (MISCELLANEOUS) ×1 IMPLANT
STRIP CLOSURE SKIN 1/2X4 (GAUZE/BANDAGES/DRESSINGS) ×6 IMPLANT
SUT MNCRL AB 4-0 PS2 18 (SUTURE) ×4 IMPLANT
SUT MON AB 5-0 PS2 18 (SUTURE) IMPLANT
SUT SILK 2 0 SH (SUTURE) ×1 IMPLANT
SUT VIC AB 2-0 SH 18 (SUTURE) ×1 IMPLANT
SUT VIC AB 2-0 SH 27 (SUTURE) ×18
SUT VIC AB 2-0 SH 27X BRD (SUTURE) IMPLANT
SUT VIC AB 2-0 SH 27XBRD (SUTURE) ×3 IMPLANT
SUT VIC AB 3-0 SH 27 (SUTURE) ×9
SUT VIC AB 3-0 SH 27X BRD (SUTURE) ×3 IMPLANT
SUT VIC AB 3-0 SH 27XBRD (SUTURE) ×3 IMPLANT
SYR CONTROL 10ML LL (SYRINGE) ×4 IMPLANT
TOWEL GREEN STERILE (TOWEL DISPOSABLE) ×4 IMPLANT
TOWEL GREEN STERILE FF (TOWEL DISPOSABLE) ×4 IMPLANT
TRACER MAGTRACE VIAL (MISCELLANEOUS) ×2 IMPLANT

## 2022-03-04 NOTE — H&P (Signed)
41 y.o. female with no prior breast history. She does have a history of a renal transplant in 2013 from which she is doing very well. 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 She underwent 3-1/2 years of peritoneal dialysis prior to the transplant. She does have an old graft in her left arm that is not used. She is noted some discharge on her night shirt in the morning. This is yellow and may somewhat be bloody. This prompted an evaluation. She underwent mammograms and ultrasound. She has see density breast. She was noted in the medial right breast to have coarse calcifications spanning up to 7 cm. There are also similar-appearing calcifications in the medial left breast as well spanning 6 cm. There is a focal distortion at the anterior and posterior groups of calcifications in the left side. Ultrasound shows that there are 2 irregular hypoechoic masses at 10:00 3 cm from the nipple and at 10:00 5 cm from the nipple measuring 1.6 and 0.6 cm respectively. Left axilla shows no adenopathy. She underwent a biopsy of both of the left breast masses. She also underwent biopsy of the right-sided lesions as well. Her clips on the left side are 3.4 cm apart the clips on the right side are 5.1 cm apart. 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 She comes in today to discuss her options.  Review of Systems: A complete review of systems was obtained from the patient. I have reviewed this information and discussed as appropriate with the patient. See HPI as well for other ROS.  Review of Systems  Cardiovascular: Positive for leg swelling.  All other systems reviewed and are negative.  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)  secondary, and anemia   Hypertension  malignant   Other ureteric obstruction 08/11/2012   Patient Active Problem List  Diagnosis   Kidney replaced by transplant   Immunosuppression (CMS-HCC)   Edema of both legs   Lymphocele   Other ureteric obstruction   Hypertension   ESRD (end stage renal disease) (CMS-HCC)   Submucous uterine fibroid   Menorrhagia with regular cycle   Fibroids   Abnormal uterine bleeding (AUB)   Past Surgical History:  Procedure Laterality Date   PARATHYROIDECTOMY 07/2011   s/p Kidney transplant Right 06/19/2012   TRANSPLANT KIDNEY Right 06/19/2012  Procedure: TRANSPLANT KIDNEY; Surgeon: Myer Peer, MD; Location: [facility]; Service: General Surgery; Laterality: Right;   HYSTEROSCOPY W/ENDOMETRIAL ABLATION USING HTA N/A 03/27/2016  Procedure: HYSTEROSCOPY WITH ENDOMETRIAL ABLATION USING HTA; Surgeon: Kathryne Hitch, MD; Location: [facility]; Service: Gynecology; Laterality: N/A;   OTHER SURGERY  endometrial ablation   Allergies  Allergen Reactions   Nickel Itching and Rash  Only where touched   Current Outpatient Medications on File Prior to Visit  Medication Sig Dispense Refill   calcitRIOL (ROCALTROL) 0.25 MCG capsule Take 0.25 mcg by mouth 2 (two) times daily.   diltiazem (CARDIZEM) 30 MG tablet Take by mouth. Take 1 tab by mouth twice  daily   FUROsemide (LASIX) 20 MG tablet Take 40 mg by mouth once daily 30 tablet 11   FUSION PLUS 130 mg iron -1,250 mcg Cap Take 1 capsule by mouth once daily   magnesium oxide (MAG-OX) 400 mg (241.3 mg magnesium) tablet Take 1 tablet by mouth 2 (two) times daily   metoprolol tartrate (LOPRESSOR) 50 MG tablet Take 50 mg by mouth 2 (two) times daily   mycophenolate (CELLCEPT) 250  mg capsule Take 3 capsules (750 mg total) by mouth 2 (two) times daily 180 capsule 11   tacrolimus (PROGRAF) 1 MG capsule   UNABLE TO FIND hairfinity   calcium carbonate 320 mg (750 mg) chewable tablet Take 2 tablets by mouth 3 (three) times daily with meals. Take at 1100, 1400 and 1700   ferrous sulfate 325 (65 FE) MG EC tablet Take by mouth once daily.   pantoprazole (PROTONIX) 40 MG DR tablet TAKE 1 TABLET ONCE DAILY 90 tablet 0   predniSONE (DELTASONE) 5 MG tablet Take 5 mg by mouth once daily   Pharmacy Test Claim TEST CLAIM ONLY 1 tablet 1    Family History  Problem Relation Age of Onset   High blood pressure (Hypertension) Mother   Obesity Mother   Anesthesia problems Neg Hx    Social History   Tobacco Use  Smoking Status Never  Smokeless Tobacco Never    Social History   Socioeconomic History   Marital status: Married  Tobacco Use   Smoking status: Never   Smokeless tobacco: Never  Substance and Sexual Activity   Alcohol use: No   Drug use: No  Social History Narrative  Lives in [place] with fiancee.   Objective:   Vitals:  02/05/22 1430  BP: 132/84  Pulse: 68  Weight: (!) 103.7 kg (228 lb 9.6 oz)  Height: 175.3 cm (5' 9")   Body mass index is 33.76 kg/m.  Physical Exam Vitals reviewed.  Constitutional:  Appearance: Normal appearance.  Chest:  Breasts: Right: No inverted nipple, mass or nipple discharge.  Left: No inverted nipple, mass or nipple discharge.  Lymphadenopathy:  Upper Body:  Right upper body: No supraclavicular or axillary adenopathy.  Left upper body: No supraclavicular or axillary adenopathy.  Neurological:  Mental Status: She is alert.    Assessment and Plan:   Malignant neoplasm of upper-outer quadrant of both breasts in female, estrogen receptor positive (CMS-HCC)  bilateral bracketed lumpectomies, left ax sn biopsy, magtrace injection on right side  We discussed the staging and pathophysiology of breast cancer. We  discussed all of the different options for treatment for breast cancer including surgery, chemotherapy, radiation therapy, Herceptin, and antiestrogen therapy. Ideally with a multiplicity of findings well as the breast density I would obtain an MRI. However with 1 transplanted kidney and an abnormal creatinine I do not think this is an option. We discussed a sentinel lymph node biopsy on the left side as she does not appear to having lymph node involvement right now. We discussed the performance of that with injection of tracer. We discussed that there is a chance of having a positive node with a sentinel lymph node biopsy and we will await the permanent pathology to make any other first further decisions in terms of her treatment. We discussed up to a 5% risk lifetime of chronic shoulder pain as well as lymphedema associated with a sentinel lymph node biopsy. On the right side I don't think that she needs a node biopsy but will inject magtrace for  possible delayed sn biopsy.  We discussed the options for treatment of the breast cancer which included lumpectomy versus a mastectomy. We discussed the performance of the lumpectomy with radioactive seed placement.both sides would be bracketed. We discussed a 5-10% chance of a positive margin requiring reexcision in the operating room. I quoted her a much higher risk for the right side as there is a chance that this side could eventually require mastectomy. We also discussed that she will likely need radiation therapy if she undergoes lumpectomy. We discussed mastectomy and the postoperative care for that as well. Mastectomy can be followed by reconstruction. The decision for lumpectomy vs mastectomy has no impact on decision for chemotherapy. Most mastectomy patients will not need radiation therapy. We discussed that there is no difference in her survival whether she undergoes lumpectomy with radiation therapy or antiestrogen therapy versus a mastectomy. There is  also no real difference between her recurrence in the breast. We discussed the risks of operation including bleeding, infection, possible reoperation. She understands her further therapy will be based on what her stages at the time of her operation.

## 2022-03-04 NOTE — Op Note (Signed)
Preoperative diagnosis: Clinical stage I left breast cancer, clinical stage 0 right breast cancer Postoperative diagnosis: Same as above Procedure:  Left breast radioactive seed bracketed lumpectomy Left deep axillary sentinel node biopsy Injection magtrace on left for sentinel node identification Right breast seed bracketed lumpectomy Injection magtrace on right for possible delayed sentinel node identificatoin Surgeon: Dr. Serita Grammes Anesthesia: General with a left pectoral block Estimated blood loss: 50 cc Complications: None Drains: None Specimens: 1.  Left breast lumpectomy containing seeds and clips marked with paint 2.  Left deep axillary sentinel nodes with highest count 6234 3. Right breast lumpectomy with seed separate and clips all eventually present 4. Right breast additional margins marked short superior , long lateral double deep Sponge needle count was correct at completion Decision to recovery stable condition  Indications: 41 y.o. female with no prior breast history. She does have a history of a renal transplant in 2013 from which she is doing very well. She is noted some discharge on her night shirt in the morning. This is yellow and may somewhat be bloody. This prompted an evaluation. She underwent mammograms and ultrasound. She has C density breast. She was noted in the medial right breast to have coarse calcifications spanning up to 7 cm. There are also similar-appearing calcifications in the medial left breast as well spanning 6 cm. There is a focal distortion at the anterior and posterior groups of calcifications in the left side. Ultrasound shows that there are 2 irregular hypoechoic masses at 10:00 3 cm from the nipple and at 10:00 5 cm from the nipple measuring 1.6 and 0.6 cm respectively. Left axilla shows no adenopathy. She underwent a biopsy of both of the left breast masses. She also underwent biopsy of the right-sided lesions as well. Her clips on the left side  are 3.4 cm apart the clips on the right side are 5.1 cm apart. 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 We elected at attempt bilateral lumpectomies with left ax sn biopsy.   Procedure: She first had seeds placed by radiology.  I had these mammograms available for my review.  After informed consent was obtained she was taken to the OR. She was given antibiotics.  SCDs were placed.  She was then placed under general anesthesia without complication.  She was prepped and draped in the standard sterile surgical fashion.  A surgical timeout was then performed.   I first injected 2 cc of mag trace bilaterally in the subareolar position.  I first did the right-sided lumpectomy.  I was able to identify both radioactive seeds.  I elected to make a periareolar incision in order to hide the scar later.  I then used cautery to dissect to the more medial posterior seed.  I then identified the seed.  This was in a hematoma cavity and it came out once I entered in the hematoma cavity.  I then rolled this medially all the way up underneath the nipple and areola to remove the other seed.  This was down to the pectoralis muscle which is the posterior margin at this point.  Mammogram confirmed removal of the other seed as well as some calcifications is one of the clips.  I removed additional margins and I remove the other clip.  I confirmed this with radiology.  I then obtained  hemostasis.  I placed clips in the cavity.  I pulled the tissue together with 2-0 Vicryl after mobilizing it.  The skin was closed with 3-0 Vicryl and 5-0 Monocryl.  Glue and sutures were applied.  I then did the left-sided lumpectomy.  I made a curvilinear incision in the upper inner quadrant due to the location of the tumors and the seeds.  I then used cautery to identify the most  medial seed.  This was also in a [employer] cavity and I remove this separately.  I took this down to the muscle and then rolled this medially.  The posterior margin is now the muscle.  This tracked up to the nipple and areola.  This is where she was having the discharge as well.  I excised this entire area.  I did a mammogram which confirmed removal of the seeds and the clips.  I felt like the margins looked good as well.  I then obtained hemostasis.  I placed clips and closed the tissue after mobilizing it with 2-0 Vicryl suture.  The skin was closed with 3-0 Vicryl and 4 Monocryl.  Glue and Steri-Strips were later applied.  I then was able to identify activity in the low axilla.  I made an incision in the low axilla and carried this through the fascia.  I used the probe to identify what appeared to be 2 brown stained nodes that had activity as listed above.  I remove these.  There were no other palpable or brown nodes.  There is no additional activity.  I obtained hemostasis.  I closed the axillary fascia with 2-0 Vicryl.  The skin was closed with 3-0 Vicryl and 4 Monocryl.  Steri-Strips and glue were applied.  She tolerated this well was extubated transferred to recovery stable.

## 2022-03-04 NOTE — Anesthesia Postprocedure Evaluation (Signed)
Anesthesia Post Note  Patient: Location manager  Procedure(s) Performed: BILATERAL BRACKETED BREAST LUMPECTOMY WITH RADIOACTIVE SEED LOCALIZATION (Bilateral: Breast) LEFT AXILLARY SENTINEL NODE BIOPSY (Left: Axilla)     Patient location during evaluation: PACU Anesthesia Type: General Level of consciousness: sedated Pain management: pain level controlled Vital Signs Assessment: post-procedure vital signs reviewed and stable Respiratory status: spontaneous breathing and respiratory function stable Cardiovascular status: stable Postop Assessment: no apparent nausea or vomiting Anesthetic complications: no   No notable events documented.  Last Vitals:  Vitals:   03/04/22 1125 03/04/22 1140  BP: (!) 144/93 (!) 138/95  Pulse: 66 72  Resp: 19 14  Temp:  36.7 C  SpO2: 93% 94%    Last Pain:  Vitals:   03/04/22 1140  TempSrc:   PainSc: Asleep                 Keah Lamba DANIEL

## 2022-03-04 NOTE — Anesthesia Procedure Notes (Signed)
Anesthesia Regional Block: Pectoralis block   Pre-Anesthetic Checklist: , timeout performed,  Correct Patient, Correct Site, Correct Laterality,  Correct Procedure, Correct Position, site marked,  Risks and benefits discussed,  Surgical consent,  Pre-op evaluation,  At surgeon's request and post-op pain management  Laterality: Left  Prep: chloraprep       Needles:  Injection technique: Single-shot  Needle Type: Echogenic Stimulator Needle     Needle Length: 10cm  Needle Gauge: 21     Additional Needles:   Procedures:,,,, ultrasound used (permanent image in chart),,    Narrative:  Start time: 03/04/2022 7:53 AM End time: 03/04/2022 8:03 AM Injection made incrementally with aspirations every 5 mL.  Performed by: Personally

## 2022-03-04 NOTE — Interval H&P Note (Signed)
History and Physical Interval Note:  03/04/2022 7:57 AM  Raven Walker  has presented today for surgery, with the diagnosis of BILATERAL BREAST CANCER.  The various methods of treatment have been discussed with the patient and family. After consideration of risks, benefits and other options for treatment, the patient has consented to  Procedure(s): BILATERAL BRACKETED BREAST LUMPECTOMY WITH RADIOACTIVE SEED LOCALIZATION (Bilateral) LEFT AXILLARY SENTINEL NODE BIOPSY (Left) as a surgical intervention.  The patient's history has been reviewed, patient examined, no change in status, stable for surgery.  I have reviewed the patient's chart and labs.  Questions were answered to the patient's satisfaction.     Rolm Bookbinder

## 2022-03-04 NOTE — Anesthesia Procedure Notes (Signed)
Procedure Name: Intubation Date/Time: 03/04/2022 8:36 AM Performed by: Kyung Rudd, CRNA Pre-anesthesia Checklist: Patient identified, Emergency Drugs available, Suction available and Patient being monitored Patient Re-evaluated:Patient Re-evaluated prior to induction Oxygen Delivery Method: Circle system utilized Preoxygenation: Pre-oxygenation with 100% oxygen Induction Type: IV induction Ventilation: Mask ventilation without difficulty Laryngoscope Size: Mac and 4 Grade View: Grade I Tube type: Oral Tube size: 7.0 mm Number of attempts: 1 Airway Equipment and Method: Stylet Placement Confirmation: ETT inserted through vocal cords under direct vision, positive ETCO2 and breath sounds checked- equal and bilateral Secured at: 21 cm Tube secured with: Tape Dental Injury: Teeth and Oropharynx as per pre-operative assessment

## 2022-03-04 NOTE — Transfer of Care (Signed)
Immediate Anesthesia Transfer of Care Note  Patient: Raven Walker  Procedure(s) Performed: BILATERAL BRACKETED BREAST LUMPECTOMY WITH RADIOACTIVE SEED LOCALIZATION (Bilateral: Breast) LEFT AXILLARY SENTINEL NODE BIOPSY (Left: Axilla)  Patient Location: PACU  Anesthesia Type:General and Regional  Level of Consciousness: drowsy  Airway & Oxygen Therapy: Patient Spontanous Breathing and Patient connected to face mask oxygen  Post-op Assessment: Report given to RN and Post -op Vital signs reviewed and stable  Post vital signs: Reviewed and stable  Last Vitals:  Vitals Value Taken Time  BP 147/86 03/04/22 1055  Temp    Pulse 78 03/04/22 1058  Resp 20 03/04/22 1058  SpO2 98 % 03/04/22 1058  Vitals shown include unvalidated device data.  Last Pain:  Vitals:   03/04/22 0810  TempSrc:   PainSc: 0-No pain      Patients Stated Pain Goal: 0 (97/28/20 6015)  Complications: No notable events documented.

## 2022-03-04 NOTE — Discharge Instructions (Signed)
[place] Office Phone Number [phone]  POST OP INSTRUCTIONS Take r 650 mg tylenol every 6 hours for next 72 hours then as needed. Use ice several times daily also.  A prescription for pain medication may be given to you upon discharge.  Take your pain medication as prescribed, if needed.  If narcotic pain medicine is not needed, then you may take acetaminophen (Tylenol), naprosyn (Alleve) or ibuprofen (Advil) as needed. Take your usually prescribed medications unless otherwise directed If you need a refill on your pain medication, please contact your pharmacy.  They will contact our office to request authorization.  Prescriptions will not be filled after 5pm or on week-ends. You should eat very light the first 24 hours after surgery, such as soup, crackers, pudding, etc.  Resume your normal diet the day after surgery. Most patients will experience some swelling and bruising in the breast.  Ice packs and a good support bra will help.  Wear the breast binder provided or a sports bra for 72 hours day and night.  After that wear a sports bra during the day until you return to the office. Swelling and bruising can take several days to resolve.  It is common to experience some constipation if taking pain medication after surgery.  Increasing fluid intake and taking a stool softener will usually help or prevent this problem from occurring.  A mild laxative (Milk of Magnesia or Miralax) should be taken according to package directions if there are no bowel movements after 48 hours. I used skin glue on the incision, you may shower in 24 hours.  The glue will flake off over the next 2-3 weeks.  Any sutures or staples will be removed at the office during your follow-up visit. ACTIVITIES:  You may resume regular daily activities (gradually increasing) beginning the next day.  Wearing a good support bra or sports bra minimizes pain and swelling.  You may have sexual intercourse when it is  comfortable. You may drive when you no longer are taking prescription pain medication, you can comfortably wear a seatbelt, and you can safely maneuver your car and apply brakes. RETURN TO WORK:  ______________________________________________________________________________________ Raven Walker should see your doctor in the office for a follow-up appointment approximately two weeks after your surgery.  Your doctor's nurse will typically make your follow-up appointment when she calls you with your pathology report.  Expect your pathology report 3-4 business days after your surgery.  You may call to check if you do not hear from [place] after three days. OTHER INSTRUCTIONS: _______________________________________________________________________________________________ _____________________________________________________________________________________________________________________________________ _____________________________________________________________________________________________________________________________________ _____________________________________________________________________________________________________________________________________  WHEN TO CALL DR Arwa Yero: Fever over 101.0 Nausea and/or vomiting. Extreme swelling or bruising. Continued bleeding from incision. Increased pain, redness, or drainage from the incision.  The clinic staff is available to answer your questions during regular business hours.  Please don't hesitate to call and ask to speak to one of the nurses for clinical concerns.  If you have a medical emergency, go to the nearest emergency room or call 911.  A surgeon from [facility] is always on call at the hospital.  For further questions, please visit [email or web address]

## 2022-03-05 ENCOUNTER — Encounter (HOSPITAL_COMMUNITY): Payer: Self-pay | Admitting: General Surgery

## 2022-03-09 ENCOUNTER — Encounter: Payer: Self-pay | Admitting: *Deleted

## 2022-03-09 LAB — SURGICAL PATHOLOGY

## 2022-03-09 NOTE — Progress Notes (Signed)
Patient Care Team: Benita Stabile, MD as PCP - General (Internal Medicine) Deterding, Fayrene Fearing, MD as Consulting Physician (Nephrology) Benita Stabile, MD (Internal Medicine) Pershing Proud, RN as Oncology Nurse Navigator Donnelly Angelica, RN as Oncology Nurse Navigator Serena Croissant, MD as Consulting Physician (Hematology and Oncology)  DIAGNOSIS:  Encounter Diagnosis  Name Primary?   Malignant neoplasm of upper-outer quadrant of left breast in female, estrogen receptor positive (HCC)     SUMMARY OF ONCOLOGIC HISTORY: Oncology History  Malignant neoplasm of upper-outer quadrant of left breast in female, estrogen receptor positive (HCC)  01/22/2022 Initial Diagnosis   Work-up performed for intermittent bloody left nipple discharge.  Mammogram and ultrasound revealed bilateral breast masses  Right breast 2 hypoechoic masses 1.6 cm and 0.6 cm.  No axillary lymph nodes.  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 Left lumpectomy: Grade 1 IDC 1.6 cm with DCIS margins negative; Left second lumpectomy: Grade 2 IDC 1 cm, DCIS, DCIS focally involves lateral margin, 0/3 lymph nodes negative     CHIEF COMPLIANT: Discuss pathology report on left lumpectomy and Bilateral radiation therapy  INTERVAL HISTORY: Raven Walker is a 41 y.o. female is here because of recent diagnosis of bilateral breast cancers. She presents to the clinic today for a follow-up and discuss pathology report. States that surgery went well. States that she still is a little sore. States that she started doing  exercise and 2 weeks later it started feeling better.   ALLERGIES:  is allergic to nickel.  MEDICATIONS:  Current Outpatient Medications  Medication Sig Dispense Refill   acetaminophen (TYLENOL) 500 MG tablet Take 500-1,000 mg by mouth every 6 (six) hours as needed for moderate pain.     calcitRIOL (ROCALTROL) 0.25 MCG capsule Take 0.5 mcg by mouth daily.     calcium carbonate (TUMS - DOSED IN MG ELEMENTAL CALCIUM) 500 MG chewable tablet Chew 1 tablet by mouth 2 (two) times daily.     diltiazem (CARDIZEM) 30 MG tablet Take 30 mg by mouth 2 (two) times daily.     furosemide (LASIX) 40 MG tablet Take 40 mg by mouth 2 (two) times daily as needed for edema.     Iron-FA-B Cmp-C-Biot-Probiotic (FUSION PLUS) CAPS Take 1 capsule by mouth daily.     magnesium oxide (MAG-OX) 400 (240 Mg) MG tablet Take 1 tablet by mouth 2 (two) times daily.     metoprolol tartrate (LOPRESSOR) 50 MG tablet Take 50 mg by mouth 2 (two) times daily.     Multiple Vitamins-Minerals (HAIR/SKIN/NAILS) TABS Take 2 tablets by mouth daily.     mycophenolate (CELLCEPT) 250 MG capsule Take 750 mg by mouth 2 (two) times daily.      oxyCODONE (OXY IR/ROXICODONE) 5 MG immediate release tablet Take 1 tablet (5 mg total) by mouth every 6 (six) hours as needed for moderate pain, severe pain or breakthrough pain. 12 tablet 0   pantoprazole (PROTONIX) 40 MG tablet Take 40 mg by mouth daily as needed (acid reflux).     tacrolimus (PROGRAF) 1 MG capsule Take 3 capsules (3 mg total) by mouth 2 (two) times daily.  No current facility-administered medications for this visit.    PHYSICAL EXAMINATION: ECOG PERFORMANCE STATUS: 1 - Symptomatic but completely ambulatory  Vitals:   03/23/22 1045  BP: 130/81  Pulse: 67  Resp: 18  Temp: (!) 97.5 F (36.4 C)  SpO2: 100%   Filed Weights   03/23/22 1045  Weight: 224 lb 3.2 oz (101.7 kg)      LABORATORY DATA:  I have reviewed the data as listed    Latest Ref Rng & Units 02/20/2022     3:20 PM 05/30/2015    9:25 AM 04/09/2014   11:00 AM  CMP  Glucose 70 - 99 mg/dL 101  99  104   BUN 6 - 20 mg/dL $Remove'21  16  30   'FzZndQL$ Creatinine 0.44 - 1.00 mg/dL 1.44  1.55  1.59   Sodium 135 - 145 mmol/L 143  139  142   Potassium 3.5 - 5.1 mmol/L 3.0  3.7  4.1   Chloride 98 - 111 mmol/L 107  105  101   CO2 22 - 32 mmol/L $RemoveB'24  25  26   'EPObcPor$ Calcium 8.9 - 10.3 mg/dL 8.9  8.1  9.1     Lab Results  Component Value Date   WBC 5.6 02/20/2022   HGB 10.7 (L) 02/20/2022   HCT 32.8 (L) 02/20/2022   MCV 82.6 02/20/2022   PLT 227 02/20/2022   NEUTROABS 9.2 (H) 08/30/2013    ASSESSMENT & PLAN:  Malignant neoplasm of upper-outer quadrant of left breast in female, estrogen receptor positive ([place]) Bilateral lumpectomies 03/04/2022 Right lumpectomy: Intermediate grade to high-grade DCIS with necrosis and calcifications, right medial margin: Intermediate grade DCIS, right superior margin: Intermediate grade DCIS, final margins negative, right additional inferior margin: DCIS. Left lumpectomy: Grade 1 IDC 1.6 cm with DCIS margins negative; Left second lumpectomy: Grade 2 IDC 1 cm, DCIS, DCIS focally involves lateral margin, 0/3 lymph nodes negative  Pathology counseling: I discussed the final pathology report of the patient provided  a copy of this report. I discussed the margins as well as lymph node surgeries. We also discussed the final staging along with previously performed ER/PR and HER-2/neu testing.  Treatment plan: 1.  Resection of the left lateral margin 2. Oncotype DX score: 5 (ROR 3%) 3. Bilateral radiation therapies 4.  Antiestrogen therapy    Return to clinic after radiation is completed to start antiestrogen therapy    No orders of the defined types were placed in this encounter.  The patient has a good understanding of the overall plan. she agrees with it. she will call with any problems that may develop before the next visit here. Total time spent: 30 mins including face to face time  and time spent for planning, charting and co-ordination of care   Harriette Ohara, MD 03/23/22    I Gardiner Coins am scribing for Dr. Lindi Adie  I have reviewed the above documentation for accuracy and completeness, and I agree with the above.

## 2022-03-10 ENCOUNTER — Telehealth: Payer: Self-pay | Admitting: *Deleted

## 2022-03-10 ENCOUNTER — Encounter: Payer: Self-pay | Admitting: *Deleted

## 2022-03-10 NOTE — Telephone Encounter (Signed)
Ordered oncotype per Dr. Lindi Adie. Faxed requisition to [facility]

## 2022-03-16 ENCOUNTER — Inpatient Hospital Stay: Payer: 59 | Attending: Hematology and Oncology | Admitting: Licensed Clinical Social Worker

## 2022-03-16 DIAGNOSIS — C50412 Malignant neoplasm of upper-outer quadrant of left female breast: Secondary | ICD-10-CM | POA: Insufficient documentation

## 2022-03-16 DIAGNOSIS — Z79899 Other long term (current) drug therapy: Secondary | ICD-10-CM | POA: Insufficient documentation

## 2022-03-16 DIAGNOSIS — Z79621 Long term (current) use of calcineurin inhibitor: Secondary | ICD-10-CM | POA: Insufficient documentation

## 2022-03-16 DIAGNOSIS — Z170 Estrogen receptor positive status [ER+]: Secondary | ICD-10-CM | POA: Insufficient documentation

## 2022-03-16 DIAGNOSIS — Z7969 Long term (current) use of other immunomodulators and immunosuppressants: Secondary | ICD-10-CM | POA: Insufficient documentation

## 2022-03-16 NOTE — Progress Notes (Signed)
[place] [facility]  Initial Assessment   Raven Walker is a 41 y.o. year old female who contacted CSW by phone.   SDOH (Social Determinants of Health) assessments performed: Yes SDOH Interventions    Flowsheet Row Most Recent Value  SDOH Interventions   Financial Strain Interventions Other (Comment)  [breast cancer foundations]  Housing Interventions Intervention Not Indicated       SDOH Screenings   Alcohol Screen: Not on file  Depression (JOA4-1): Not on file  Financial Resource Strain: Medium Risk (03/16/2022)   Overall Financial Resource Strain (CARDIA)    Difficulty of Paying Living Expenses: Somewhat hard  Food Insecurity: Not on file  Housing: Low Risk  (03/16/2022)   Housing    Last Housing Risk Score: 0  Physical Activity: Not on file  Social Connections: Not on file  Stress: Not on file  Tobacco Use: Medium Risk (03/05/2022)   Patient History    Smoking Tobacco Use: Former    Smokeless Tobacco Use: Never    Passive Exposure: Not on file  Transportation Needs: Not on file     Distress Screen completed: No     No data to display            Family/Social Information:  Housing Arrangement: patient lives with husband and son . They live in mom's rental property Family members/support persons in your life? Family Transportation concerns: no  Employment: Working full time currently on short term disability.  Income source: Short-Term Disability and husband's employment Financial concerns: Yes, due to illness and/or loss of work during treatment Type of concern: Medical bills Food access concerns: no Religious or spiritual practice: Not known Services Currently in place:  short-term disability through work  Coping/ Adjustment to diagnosis: Patient understands treatment plan and what happens next? yes Concerns about diagnosis and/or treatment: How I will pay for the services I need- has insurance, but receiving many bills Patient  reported stressors: Insurance Current coping skills/ strengths: Capable of independent living  and Motivation for treatment/growth     SUMMARY: Current SDOH Barriers:  Financial constraints related to cancer treatment  Clinical Social Work Clinical Goal(s):  Patient will complete applications for financial assistance provided by CSW  Interventions: Informed patient of the support team roles and support services at [facility] Provided CSW contact information and encouraged patient to call with any questions or concerns Provided patient with information about cancer foundations that may provide financial assistance Provided education on out-of-pocket insurance maximums   Follow Up Plan: Patient will work on applications and notify CSW with any questions Patient verbalizes understanding of plan: Yes    Raven Aja E Elyjah Hazan, LCSW

## 2022-03-19 ENCOUNTER — Encounter (HOSPITAL_COMMUNITY): Payer: Self-pay

## 2022-03-23 ENCOUNTER — Other Ambulatory Visit: Payer: Self-pay

## 2022-03-23 ENCOUNTER — Inpatient Hospital Stay (HOSPITAL_BASED_OUTPATIENT_CLINIC_OR_DEPARTMENT_OTHER): Payer: 59 | Admitting: Hematology and Oncology

## 2022-03-23 ENCOUNTER — Telehealth: Payer: Self-pay | Admitting: *Deleted

## 2022-03-23 DIAGNOSIS — Z79899 Other long term (current) drug therapy: Secondary | ICD-10-CM | POA: Diagnosis not present

## 2022-03-23 DIAGNOSIS — C50412 Malignant neoplasm of upper-outer quadrant of left female breast: Secondary | ICD-10-CM | POA: Diagnosis present

## 2022-03-23 DIAGNOSIS — Z170 Estrogen receptor positive status [ER+]: Secondary | ICD-10-CM

## 2022-03-23 DIAGNOSIS — Z7969 Long term (current) use of other immunomodulators and immunosuppressants: Secondary | ICD-10-CM | POA: Diagnosis not present

## 2022-03-23 DIAGNOSIS — Z79621 Long term (current) use of calcineurin inhibitor: Secondary | ICD-10-CM | POA: Diagnosis not present

## 2022-03-23 NOTE — Telephone Encounter (Signed)
Received oncotype score of 3. Physician team aware

## 2022-03-23 NOTE — Assessment & Plan Note (Addendum)
Bilateral lumpectomies 03/04/2022 Right lumpectomy: Intermediate grade to high-grade DCIS with necrosis and calcifications, right medial margin: Intermediate grade DCIS, right superior margin: Intermediate grade DCIS, final margins negative, right additional inferior margin: DCIS. Left lumpectomy: Grade 1 IDC 1.6 cm with DCIS margins negative; Left second lumpectomy: Grade 2 IDC 1 cm, DCIS, DCIS focally involves lateral margin, 0/3 lymph nodes negative  Pathology counseling: I discussed the final pathology report of the patient provided  a copy of this report. I discussed the margins as well as lymph node surgeries. We also discussed the final staging along with previously performed ER/PR and HER-2/neu testing.  Treatment plan: 1.  Resection of the left lateral margin 2. Oncotype DX on the left lumpectomy  3. Bilateral radiation therapies 4.  Antiestrogen therapy  Oncotype counseling: I discussed Oncotype DX test. I explained to the patient that this is a 21 gene panel to evaluate patient tumors DNA to calculate recurrence score. This would help determine whether patient has high risk or intermediate risk or low risk breast cancer. She understands that if her tumor was found to be high risk, she would benefit from systemic chemotherapy. If low risk, no need of chemotherapy. If she was found to be intermediate risk, we would need to evaluate the score as well as other risk factors and determine if an abbreviated chemotherapy may be of benefit.  Return to clinic based upon Oncotype DX test result

## 2022-03-31 NOTE — Therapy (Signed)
OUTPATIENT PHYSICAL THERAPY BREAST CANCER POST OP FOLLOW UP   Patient Name: Raven Walker MRN: [id number] DOB:14-Aug-1981, 41 y.o., female Today's Date: 04/01/2022   PT End of Session - 04/01/22 1452     Visit Number 2    Number of Visits 2    Date for PT Re-Evaluation 04/01/22    PT Start Time 6384    PT Stop Time 6659    PT Time Calculation (min) 55 min    Activity Tolerance Patient tolerated treatment well    Behavior During Therapy [facility] for tasks assessed/performed             Past Medical History:  Diagnosis Date   Anemia    history   BV (bacterial vaginosis) 06/04/2014   ESRD on peritoneal dialysis ([place]) 10/05/2006   pt had kidney transplant 06/2012 and is no longer on dialysis   GERD (gastroesophageal reflux disease) 09/04/2009   ulcerative esophagitis per EGD    H/O kidney transplant 06/19/2012   Hematuria 07/02/2014   Hypertension    Poor appetite    History   Secondary hyperparathyroidism ([place])    s/p parathyroidectomy with autotransplantation to left arm per Dr. Baker Pierini on 08/04/11   Streptococcal peritonitis ([place]) 06/06/2011   SVD (spontaneous vaginal delivery)    x 1   Urinary frequency 07/02/2014   UTI (lower urinary tract infection) 07/02/2014   Vaginal discharge 06/04/2014   Past Surgical History:  Procedure Laterality Date   arm graphs  2008   three - done at [facility]; still in but never used because pt got a transplant.   AXILLARY SENTINEL NODE BIOPSY Left 03/04/2022   Procedure: LEFT AXILLARY SENTINEL NODE BIOPSY;  Surgeon: Rolm Bookbinder, MD;  Location: [place];  Service: General;  Laterality: Left;   BREAST LUMPECTOMY WITH RADIOACTIVE SEED LOCALIZATION Bilateral 03/04/2022   Procedure: BILATERAL BRACKETED BREAST LUMPECTOMY WITH RADIOACTIVE SEED LOCALIZATION;  Surgeon: Rolm Bookbinder, MD;  Location: [place];  Service: General;  Laterality: Bilateral;   DILATATION & CURETTAGE/HYSTEROSCOPY WITH TRUECLEAR N/A 04/16/2014    Procedure: DILATATION & CURETTAGE/HYSTEROSCOPY WITH TRUCLEAR AND RESECTION OF FIBROID/THERMACHOICE ABLATION;  Surgeon: Jonnie Kind, MD;  Location: [facility];  Service: Gynecology;  Laterality: N/A;   KIDNEY TRANSPLANT Right 2013   [address]  08/05/11   with graft of parathyroid into left upper arm.   WISDOM TOOTH EXTRACTION     Patient Active Problem List   Diagnosis Date Noted   Malignant neoplasm of upper-outer quadrant of left breast in female, estrogen receptor positive ([place]) 02/09/2022   Ductal carcinoma in situ (DCIS) of right breast 02/09/2022   Fibroids 01/06/2018   Abnormal uterine bleeding (AUB) 08/15/2015   Menorrhagia with regular cycle 03/14/2015   Urinary frequency 07/02/2014   Hematuria 07/02/2014   UTI (lower urinary tract infection) 07/02/2014   Vaginal discharge 06/04/2014   BV (bacterial vaginosis) 06/04/2014   Post-operative state 05/02/2014   Submucous uterine fibroid with menorrhagia 02/05/2014   Sepsis ([place]) 11/20/2013   UTI (urinary tract infection) 11/20/2013   Myalgia 11/20/2013   Acute on chronic renal failure ([place]) 11/20/2013   Leukocytosis, unspecified 11/20/2013   Menorrhagia, premenopausal 11/15/2013   Fever 08/30/2013   Acute pyelonephritis 06/29/2013   History of renal transplant 06/29/2013   Other ureteric obstruction 08/11/2012   Lymphocele 07/15/2012   Edema of both legs 07/04/2012   Immunosuppression ([place]) 06/28/2012   Hypocalcemia syndrome 08/19/2011    Class: Acute   Renal failure (ARF), acute  on chronic ([place]) 08/19/2011    Class: Acute   HTN (hypertension) 08/19/2011    Class: Acute   GERD (gastroesophageal reflux disease) 08/19/2011    Class: Chronic   Hyperparathyroidism, secondary ([place]) 05/19/2011    PCP: Meade Maw, MD  REFERRING PROVIDER: Rolm Bookbinder, MD  REFERRING DIAG: Bilateral Breast Cancer  THERAPY DIAG:  Malignant neoplasm of upper-outer quadrant of right breast in female, estrogen  receptor positive ([place])  Malignant neoplasm of upper-outer quadrant of left breast in female, estrogen receptor positive ([place])  Abnormal posture  Rationale for Evaluation and Treatment Rehabilitation  ONSET DATE: 01/26/2022  SUBJECTIVE:                                                                                                                                                                                           SUBJECTIVE STATEMENT: I was super tight at first but it has loosened up. I feel pretty close to normal with ROM  PERTINENT HISTORY:  Patient was diagnosed on 01/26/2022 with bilateral  breast Cancer with Right  gr. 2 DCIS ER, PR + and left Invasive ductal carcinoma. It measures .6 cm and is located in the upper outer quadrant. It is ER, PR +, Her 2 - with a Ki67 of 1%. She is s/p bilateral breast lumpectomies and left SLNB on 03/04/2022.  She is scheduled for a left breast re-excision on 04/14/2022. She is also to have radiation and anti estrogen. She is s/p a kidney transplant in 2013 and is doing very well  PATIENT GOALS:  Reassess how my recovery is going related to arm function, pain, and swelling.  PAIN:  Are you having pain? No  PRECAUTIONS: Recent Surgery, left UE Lymphedema risk, Other: Kidney transplant 2013  ACTIVITY LEVEL / LEISURE: working in garden. Not walking yet   OBJECTIVE:   PATIENT SURVEYS:  QUICK DASH: 4.55  OBSERVATIONS:  Large area of induration left axillary region above incision with well defined borders. No redness or tenderness. Mild fibrosis left mid breast. Steri strips still present on left and right breast incisions  POSTURE:  Forward head, rounded shoulders  LYMPHEDEMA ASSESSMENT:   UPPER EXTREMITY AROM/PROM:   A/PROM RIGHT  02/09/2022   04/01/2022  Shoulder extension 68 76  Shoulder flexion 170 170  Shoulder abduction 180 180  Shoulder internal rotation 76 78  Shoulder external rotation 103 102                          (Blank  rows = not tested)   A/PROM LEFT  02/09/2022 04/01/2022  Shoulder extension 68 75  Shoulder  flexion 165 168  Shoulder abduction 177 180  Shoulder internal rotation 75 73  Shoulder external rotation 95 95                          (Blank rows = not tested)     CERVICAL AROM: All within normal limits:          UPPER EXTREMITY STRENGTH: WNL     LYMPHEDEMA ASSESSMENTS:    LANDMARK RIGHT  02/09/2022 04/01/2022  10 cm proximal to olecranon process 38.3 39.  Olecranon process 28.4 28.5  10 cm proximal to ulnar styloid process 24.6 24.3  Just proximal to ulnar styloid process 18.6 18.7  Across hand at thumb web space 22.0 21.6  At base of 2nd digit 6.9 6.9  (Blank rows = not tested)   LANDMARK LEFT  02/09/2022 6/28/20236.8  10 cm proximal to olecranon process 38.6 38.4  Olecranon process 28.3 28.2  10 cm proximal to ulnar styloid process 24.5 24.0  Just proximal to ulnar styloid process 18.3 17.8  Across hand at thumb web space 21.0 20.8  At base of 2nd digit 6.7 6.8  (Blank rows = not tested)         Surgery type/Date: 03/04/2022, Bilateral breast lumpectomies with left SLNB Number of lymph nodes removed: 0/3 left Current/past treatment (chemo, radiation, hormone therapy): will have radiation and anti estrogens Other symptoms:  Heaviness/tightness No Pain No Pitting edema No Infections No Decreased scar mobility Yes Stemmer sign No   PATIENT EDUCATION:  Education details: educated in ABC class, continuing stretches during radiation, SOZO screens, scar massage, use of foam for comfort in bra Person educated: Patient Education method: Explanation, handout Education comprehension: verbalized understanding   HOME EXERCISE PROGRAM:  Reviewed previously given post op HEP. Encouraged pt to continue throughout radiation  ASSESSMENT:  CLINICAL IMPRESSION: Pt is s/p 03/04/2022, Bilateral breast lumpectomies with left SLNB 0/3 LN.  Her shoulder ROM has returned to WNL  bilaterally and there is no sign of UE lymphedema. Steri strips are still present over breast incisions and there is an area of induration in the left axillary region with well defined borders that is not red or tender. Photo placed in media and MD messaged.  Gray foam in TG soft cover was made for left breast incision, inferior borders of compression bra and left axillary border of compression bra for comfort and to prevent sliding. Pt was instructed in scar massage to axillary incision but will not start other incisions until steristrips and scabs are gone.  Follow up appts for ABC class and next SOZO screen while made while pt was here. She  was advised to tuck a rolled sock in axillary region in an effort to decrease swelling.  She is doing very well and no further PT needs are identified. She was advised to message or call with questions or concerns. She is pending a reexcision on the left breast on April 14, 2022  Pt will benefit from skilled therapeutic intervention to improve on the following deficits: Decreased knowledge of precautions, impaired UE functional use, pain, decreased ROM, postural dysfunction.   PT treatment/interventions: ADL/Self care home management, Therapeutic exercises, Patient/Family education, and scar mobilization     GOALS: Goals reviewed with patient? Yes  LONG TERM GOALS:  (STG=LTG)  GOALS Name Target Date  Goal status  1 Pt will demonstrate she has regained full shoulder ROM and function post operatively compared to baselines.  Baseline: 04/01/2022 MET  _0 PLAN: PT FREQUENCY/DURATION: no further appts scheduled. No other PT needs identified  PLAN FOR NEXT SESSION: Pt discharged to independent managment   [facility]  [address]  [place] [address]  [phone]  After Breast Cancer Class It is recommended you attend the ABC class to be educated on lymphedema risk reduction. This class is free  of charge and lasts for 1 hour. It is a 1-time class. You will need to download the Webex app either on your phone or computer. We will send you a link the night before or the morning of the class. You should be able to click on that link to join the class. This is not a confidential class. You don't have to turn your camera on, but other participants may be able to see your email address.  Scar massage You can begin gentle scar massage to you incision sites. Gently place one hand on the incision and move the skin (without sliding on the skin) in various directions. Do this for a few minutes and then you can gently massage either coconut oil or vitamin E cream into the scars.  Compression garment You should continue wearing your compression bra until you feel like you no longer have swelling.  Home exercise Program Continue doing the exercises you were given until you feel like you can do them without feeling any tightness at the end.   Walking Program Studies show that 30 minutes of walking per day (fast enough to elevate your heart rate) can significantly reduce the risk of a cancer recurrence. If you can't walk due to other medical reasons, we encourage you to find another activity you could do (like a stationary bike or water exercise).  Posture After breast cancer surgery, people frequently sit with rounded shoulders posture because it puts their incisions on slack and feels better. If you sit like this and scar tissue forms in that position, you can become very tight and have pain sitting or standing with good posture. Try to be aware of your posture and sit and stand up tall to heal properly.  Follow up PT: It is recommended you return every 3 months for the first 3 years following surgery to be assessed on the SOZO machine for an L-Dex score. This helps prevent clinically significant lymphedema in 95% of patients. These follow up screens are 10 minute appointments that you are not billed  for.  Claris Pong, PT 04/01/2022, 3:49 PM

## 2022-04-01 ENCOUNTER — Encounter (HOSPITAL_BASED_OUTPATIENT_CLINIC_OR_DEPARTMENT_OTHER): Payer: Self-pay | Admitting: General Surgery

## 2022-04-01 ENCOUNTER — Encounter: Payer: Self-pay | Admitting: Licensed Clinical Social Worker

## 2022-04-01 ENCOUNTER — Other Ambulatory Visit: Payer: Self-pay

## 2022-04-01 ENCOUNTER — Ambulatory Visit: Payer: 59 | Attending: General Surgery

## 2022-04-01 DIAGNOSIS — C50411 Malignant neoplasm of upper-outer quadrant of right female breast: Secondary | ICD-10-CM | POA: Insufficient documentation

## 2022-04-01 DIAGNOSIS — Z170 Estrogen receptor positive status [ER+]: Secondary | ICD-10-CM | POA: Diagnosis present

## 2022-04-01 DIAGNOSIS — C50412 Malignant neoplasm of upper-outer quadrant of left female breast: Secondary | ICD-10-CM | POA: Diagnosis present

## 2022-04-01 DIAGNOSIS — R293 Abnormal posture: Secondary | ICD-10-CM | POA: Diagnosis present

## 2022-04-01 DIAGNOSIS — D0511 Intraductal carcinoma in situ of right breast: Secondary | ICD-10-CM

## 2022-04-01 NOTE — Patient Instructions (Signed)
     [facility]  [address], [address]  [place] [address]  [phone]  After Breast Cancer Class It is recommended you attend the ABC class to be educated on lymphedema risk reduction. This class is free of charge and lasts for 1 hour. It is a 1-time class. You will need to download the Webex app either on your phone or computer. We will send you a link the night before or the morning of the class. You should be able to click on that link to join the class. This is not a confidential class. You don't have to turn your camera on, but other participants may be able to see your email address.  Scar massage You can begin gentle scar massage to you incision sites. Gently place one hand on the incision and move the skin (without sliding on the skin) in various directions. Do this for a few minutes and then you can gently massage either coconut oil or vitamin E cream into the scars.  Compression garment You should continue wearing your compression bra until you feel like you no longer have swelling.  Home exercise Program Continue doing the exercises you were given until you feel like you can do them without feeling any tightness at the end.   Walking Program Studies show that 30 minutes of walking per day (fast enough to elevate your heart rate) can significantly reduce the risk of a cancer recurrence. If you can't walk due to other medical reasons, we encourage you to find another activity you could do (like a stationary bike or water exercise).  Posture After breast cancer surgery, people frequently sit with rounded shoulders posture because it puts their incisions on slack and feels better. If you sit like this and scar tissue forms in that position, you can become very tight and have pain sitting or standing with good posture. Try to be aware of your posture and sit and stand up tall to heal properly.  Follow up PT: It is recommended you return every 3 months for  the first 2 years following surgery to be assessed on the SOZO machine for an L-Dex score. This helps prevent clinically significant lymphedema in 95% of patients. These follow up screens are 10 minute appointments that you are not billed for.  

## 2022-04-01 NOTE — OR Nursing (Signed)
Dr. Donne Hazel spoke with Dr. Ermalene Postin and he cleared this patient to be done here at [facility].

## 2022-04-01 NOTE — Progress Notes (Signed)
[place] CSW Progress Note  Clinical Education officer, museum contacted patient by phone to answer questions regarding the [employer].  Medical provider page of application completed by CSW, scanned and emailed to patient.  CSW to remain available to assist w/ any additional needs which may arise.    Henriette Combs, LCSW

## 2022-04-03 ENCOUNTER — Encounter (HOSPITAL_COMMUNITY): Payer: Self-pay

## 2022-04-09 ENCOUNTER — Encounter (HOSPITAL_BASED_OUTPATIENT_CLINIC_OR_DEPARTMENT_OTHER)
Admission: RE | Admit: 2022-04-09 | Discharge: 2022-04-09 | Disposition: A | Discharge status: Home / Self Care | Payer: 59 | Source: Ambulatory Visit | Attending: General Surgery | Admitting: General Surgery

## 2022-04-09 DIAGNOSIS — D0511 Intraductal carcinoma in situ of right breast: Secondary | ICD-10-CM | POA: Insufficient documentation

## 2022-04-09 DIAGNOSIS — Z01812 Encounter for preprocedural laboratory examination: Secondary | ICD-10-CM | POA: Insufficient documentation

## 2022-04-09 LAB — BASIC METABOLIC PANEL
Anion gap: 14 (ref 5–15)
BUN: 17 mg/dL (ref 6–20)
CO2: 27 mmol/L (ref 22–32)
Calcium: 9.5 mg/dL (ref 8.9–10.3)
Chloride: 101 mmol/L (ref 98–111)
Creatinine, Ser: 1.33 mg/dL — ABNORMAL HIGH (ref 0.44–1.00)
GFR, Estimated: 51 mL/min — ABNORMAL LOW (ref >60)
Glucose, Bld: 99 mg/dL (ref 70–99)
Potassium: 3.9 mmol/L (ref 3.5–5.1)
Sodium: 142 mmol/L (ref 135–145)

## 2022-04-13 ENCOUNTER — Other Ambulatory Visit: Payer: Self-pay | Admitting: General Surgery

## 2022-04-14 ENCOUNTER — Encounter (HOSPITAL_BASED_OUTPATIENT_CLINIC_OR_DEPARTMENT_OTHER): Payer: Self-pay | Admitting: General Surgery

## 2022-04-14 ENCOUNTER — Encounter (HOSPITAL_BASED_OUTPATIENT_CLINIC_OR_DEPARTMENT_OTHER)
Admission: RE | Disposition: A | Discharge status: Home / Self Care | Payer: Self-pay | Source: Home / Self Care | Attending: General Surgery

## 2022-04-14 ENCOUNTER — Other Ambulatory Visit: Payer: Self-pay

## 2022-04-14 ENCOUNTER — Ambulatory Visit (HOSPITAL_COMMUNITY)
Admission: RE | Admit: 2022-04-14 | Discharge: 2022-04-14 | Disposition: A | Discharge status: Home / Self Care | Payer: 59 | Attending: General Surgery | Admitting: General Surgery

## 2022-04-14 ENCOUNTER — Ambulatory Visit (HOSPITAL_BASED_OUTPATIENT_CLINIC_OR_DEPARTMENT_OTHER): Payer: 59 | Admitting: Anesthesiology

## 2022-04-14 DIAGNOSIS — C50912 Malignant neoplasm of unspecified site of left female breast: Secondary | ICD-10-CM | POA: Diagnosis present

## 2022-04-14 DIAGNOSIS — C50911 Malignant neoplasm of unspecified site of right female breast: Secondary | ICD-10-CM | POA: Diagnosis present

## 2022-04-14 DIAGNOSIS — Z940 Kidney transplant status: Secondary | ICD-10-CM | POA: Diagnosis not present

## 2022-04-14 DIAGNOSIS — I10 Essential (primary) hypertension: Secondary | ICD-10-CM | POA: Diagnosis not present

## 2022-04-14 DIAGNOSIS — D8489 Other immunodeficiencies: Secondary | ICD-10-CM | POA: Insufficient documentation

## 2022-04-14 DIAGNOSIS — D0511 Intraductal carcinoma in situ of right breast: Secondary | ICD-10-CM

## 2022-04-14 DIAGNOSIS — Z87891 Personal history of nicotine dependence: Secondary | ICD-10-CM

## 2022-04-14 DIAGNOSIS — E669 Obesity, unspecified: Secondary | ICD-10-CM | POA: Insufficient documentation

## 2022-04-14 DIAGNOSIS — Z170 Estrogen receptor positive status [ER+]: Secondary | ICD-10-CM | POA: Insufficient documentation

## 2022-04-14 HISTORY — PX: RE-EXCISION OF BREAST LUMPECTOMY: SHX6048

## 2022-04-14 SURGERY — EXCISION, LESION, BREAST
Anesthesia: General | Site: Breast | Laterality: Left

## 2022-04-14 MED ORDER — FENTANYL CITRATE (PF) 100 MCG/2ML IJ SOLN
25.0000 ug | INTRAMUSCULAR | Status: DC | PRN
  Administered 2022-04-14 (×2): 25 ug via INTRAVENOUS
  Administered 2022-04-14: 50 ug via INTRAVENOUS

## 2022-04-14 MED ORDER — ACETAMINOPHEN 500 MG PO TABS
ORAL_TABLET | ORAL | Status: AC
  Filled 2022-04-14: qty 2

## 2022-04-14 MED ORDER — PROPOFOL 10 MG/ML IV BOLUS
INTRAVENOUS | Status: AC
  Filled 2022-04-14: qty 20

## 2022-04-14 MED ORDER — CHLORHEXIDINE GLUCONATE CLOTH 2 % EX PADS: 6.0000 | MEDICATED_PAD | Freq: Once | CUTANEOUS | Status: DC

## 2022-04-14 MED ORDER — LACTATED RINGERS IV SOLN: INTRAVENOUS | Status: DC

## 2022-04-14 MED ORDER — PROPOFOL 500 MG/50ML IV EMUL
INTRAVENOUS | Status: DC | PRN
  Administered 2022-04-14 (×2): 150 ug/kg/min via INTRAVENOUS

## 2022-04-14 MED ORDER — OXYCODONE HCL 5 MG PO TABS
ORAL_TABLET | ORAL | Status: AC
  Filled 2022-04-14: qty 1

## 2022-04-14 MED ORDER — LIDOCAINE 2% (20 MG/ML) 5 ML SYRINGE
INTRAMUSCULAR | Status: DC | PRN
  Administered 2022-04-14: 60 mg via INTRAVENOUS

## 2022-04-14 MED ORDER — CEFAZOLIN SODIUM-DEXTROSE 2-4 GM/100ML-% IV SOLN
2.0000 g | INTRAVENOUS | Status: AC
  Administered 2022-04-14: 2 g via INTRAVENOUS

## 2022-04-14 MED ORDER — FENTANYL CITRATE (PF) 100 MCG/2ML IJ SOLN
INTRAMUSCULAR | Status: DC | PRN
  Administered 2022-04-14: 100 ug via INTRAVENOUS

## 2022-04-14 MED ORDER — FENTANYL CITRATE (PF) 100 MCG/2ML IJ SOLN
INTRAMUSCULAR | Status: AC
  Filled 2022-04-14: qty 2

## 2022-04-14 MED ORDER — MIDAZOLAM HCL 5 MG/5ML IJ SOLN
INTRAMUSCULAR | Status: DC | PRN
  Administered 2022-04-14: 2 mg via INTRAVENOUS

## 2022-04-14 MED ORDER — OXYCODONE HCL 5 MG PO TABS
5.0000 mg | ORAL_TABLET | Freq: Once | ORAL | Status: AC | PRN
  Administered 2022-04-14: 5 mg via ORAL

## 2022-04-14 MED ORDER — CEFAZOLIN SODIUM 1 G IJ SOLR
INTRAMUSCULAR | Status: AC
  Filled 2022-04-14: qty 20

## 2022-04-14 MED ORDER — BUPIVACAINE HCL (PF) 0.25 % IJ SOLN
INTRAMUSCULAR | Status: DC | PRN
  Administered 2022-04-14: 10 mL

## 2022-04-14 MED ORDER — OXYCODONE HCL 5 MG/5ML PO SOLN: 5.0000 mg | Freq: Once | ORAL | Status: AC | PRN

## 2022-04-14 MED ORDER — ACETAMINOPHEN 500 MG PO TABS
1000.0000 mg | ORAL_TABLET | ORAL | Status: AC
  Administered 2022-04-14: 1000 mg via ORAL

## 2022-04-14 MED ORDER — PROPOFOL 500 MG/50ML IV EMUL
INTRAVENOUS | Status: AC
  Filled 2022-04-14: qty 50

## 2022-04-14 MED ORDER — ENSURE PRE-SURGERY PO LIQD: 296.0000 mL | Freq: Once | ORAL | Status: DC

## 2022-04-14 MED ORDER — ONDANSETRON HCL 4 MG/2ML IJ SOLN: 4.0000 mg | Freq: Once | INTRAMUSCULAR | Status: DC | PRN

## 2022-04-14 MED ORDER — ONDANSETRON HCL 4 MG/2ML IJ SOLN
INTRAMUSCULAR | Status: DC | PRN
  Administered 2022-04-14: 4 mg via INTRAVENOUS

## 2022-04-14 MED ORDER — DEXAMETHASONE SODIUM PHOSPHATE 4 MG/ML IJ SOLN
INTRAMUSCULAR | Status: DC | PRN
  Administered 2022-04-14: 10 mg via INTRAVENOUS

## 2022-04-14 MED ORDER — MIDAZOLAM HCL 2 MG/2ML IJ SOLN
INTRAMUSCULAR | Status: AC
  Filled 2022-04-14: qty 2

## 2022-04-14 MED ORDER — PROPOFOL 10 MG/ML IV BOLUS
INTRAVENOUS | Status: DC | PRN
  Administered 2022-04-14: 150 mg via INTRAVENOUS

## 2022-04-14 MED ORDER — CEFAZOLIN SODIUM-DEXTROSE 2-4 GM/100ML-% IV SOLN
INTRAVENOUS | Status: AC
  Filled 2022-04-14: qty 100

## 2022-04-14 SURGICAL SUPPLY — 53 items
ADH SKN CLS APL DERMABOND .7 (GAUZE/BANDAGES/DRESSINGS)
APL PRP STRL LF DISP 70% ISPRP (MISCELLANEOUS) ×1
BINDER BREAST LRG (GAUZE/BANDAGES/DRESSINGS) IMPLANT
BINDER BREAST MEDIUM (GAUZE/BANDAGES/DRESSINGS) IMPLANT
BINDER BREAST XLRG (GAUZE/BANDAGES/DRESSINGS) IMPLANT
BINDER BREAST XXLRG (GAUZE/BANDAGES/DRESSINGS) ×1 IMPLANT
BLADE SURG 15 STRL LF DISP TIS (BLADE) ×2 IMPLANT
BLADE SURG 15 STRL SS (BLADE) ×2
CANISTER SUCT 1200ML W/VALVE (MISCELLANEOUS) ×3 IMPLANT
CHLORAPREP W/TINT 26 (MISCELLANEOUS) ×3 IMPLANT
CLIP TI WIDE RED SMALL 6 (CLIP) IMPLANT
COVER BACK TABLE 60X90IN (DRAPES) ×3 IMPLANT
COVER MAYO STAND STRL (DRAPES) ×3 IMPLANT
DERMABOND ADVANCED (GAUZE/BANDAGES/DRESSINGS)
DERMABOND ADVANCED .7 DNX12 (GAUZE/BANDAGES/DRESSINGS) IMPLANT
DRAPE LAPAROSCOPIC ABDOMINAL (DRAPES) ×3 IMPLANT
DRAPE UTILITY XL STRL (DRAPES) ×3 IMPLANT
DRSG TEGADERM 4X4.75 (GAUZE/BANDAGES/DRESSINGS) ×3 IMPLANT
ELECT COATED BLADE 2.86 ST (ELECTRODE) ×3 IMPLANT
ELECT REM PT RETURN 9FT ADLT (ELECTROSURGICAL) ×2
ELECTRODE REM PT RTRN 9FT ADLT (ELECTROSURGICAL) ×2 IMPLANT
GAUZE SPONGE 4X4 12PLY STRL LF (GAUZE/BANDAGES/DRESSINGS) ×3 IMPLANT
GLOVE BIO SURGEON STRL SZ7 (GLOVE) ×3 IMPLANT
GLOVE BIOGEL PI IND STRL 7.5 (GLOVE) ×2 IMPLANT
GLOVE BIOGEL PI INDICATOR 7.5 (GLOVE) ×1
GOWN STRL REUS W/ TWL LRG LVL3 (GOWN DISPOSABLE) ×6 IMPLANT
GOWN STRL REUS W/TWL LRG LVL3 (GOWN DISPOSABLE) ×6
HEMOSTAT ARISTA ABSORB 3G PWDR (HEMOSTASIS) IMPLANT
KIT MARKER MARGIN INK (KITS) IMPLANT
NDL HYPO 25X1 1.5 SAFETY (NEEDLE) ×2 IMPLANT
NEEDLE HYPO 25X1 1.5 SAFETY (NEEDLE) ×2 IMPLANT
NS IRRIG 1000ML POUR BTL (IV SOLUTION) IMPLANT
PACK BASIN DAY SURGERY FS (CUSTOM PROCEDURE TRAY) ×3 IMPLANT
PENCIL SMOKE EVACUATOR (MISCELLANEOUS) ×3 IMPLANT
RETRACTOR ONETRAX LX 90X20 (MISCELLANEOUS) IMPLANT
SLEEVE SCD COMPRESS KNEE MED (STOCKING) ×3 IMPLANT
SPIKE FLUID TRANSFER (MISCELLANEOUS) IMPLANT
SPONGE T-LAP 4X18 ~~LOC~~+RFID (SPONGE) ×3 IMPLANT
STRIP CLOSURE SKIN 1/2X4 (GAUZE/BANDAGES/DRESSINGS) IMPLANT
SUT MNCRL AB 3-0 PS2 18 (SUTURE) IMPLANT
SUT MNCRL AB 4-0 PS2 18 (SUTURE) ×1 IMPLANT
SUT MON AB 5-0 PS2 18 (SUTURE) IMPLANT
SUT SILK 2 0 SH (SUTURE) IMPLANT
SUT VIC AB 2-0 SH 27 (SUTURE) ×4
SUT VIC AB 2-0 SH 27XBRD (SUTURE) ×2 IMPLANT
SUT VIC AB 3-0 SH 27 (SUTURE) ×2
SUT VIC AB 3-0 SH 27X BRD (SUTURE) ×2 IMPLANT
SUT VIC AB 5-0 PS2 18 (SUTURE) IMPLANT
SUT VICRYL AB 3 0 TIES (SUTURE) IMPLANT
SYR CONTROL 10ML LL (SYRINGE) ×3 IMPLANT
TOWEL GREEN STERILE FF (TOWEL DISPOSABLE) ×3 IMPLANT
TUBE CONNECTING 20X1/4 (TUBING) ×3 IMPLANT
YANKAUER SUCT BULB TIP NO VENT (SUCTIONS) ×3 IMPLANT

## 2022-04-14 NOTE — Anesthesia Preprocedure Evaluation (Signed)
Anesthesia Evaluation  Patient identified by MRN, date of birth, ID band Patient awake    Reviewed: Allergy & Precautions, NPO status , Patient's Chart, lab work & pertinent test results, reviewed documented beta blocker date and time   Airway Mallampati: II  TM Distance: >3 FB Neck ROM: Full    Dental no notable dental hx. (+) Teeth Intact, Dental Advisory Given   Pulmonary former smoker,    Pulmonary exam normal breath sounds clear to auscultation       Cardiovascular hypertension, Pt. on medications and Pt. 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   Anesthesia Other Findings   Reproductive/Obstetrics S/P endometrial ablation                             Anesthesia Physical Anesthesia Plan  ASA: 3  Anesthesia Plan: General   Post-op Pain Management: Minimal or no pain anticipated   Induction: Intravenous  PONV Risk Score and Plan: 4 or greater and Treatment may vary due to age or medical condition, Midazolam, Ondansetron and Dexamethasone  Airway Management Planned: LMA  Additional Equipment: None  Intra-op Plan:   Post-operative Plan: Extubation in OR  Informed Consent: I have reviewed the patients History and Physical, chart, labs and discussed the procedure including the risks, benefits and alternatives for the proposed anesthesia with the patient or authorized representative who has indicated his/her understanding and acceptance.     Dental  advisory given  Plan Discussed with: Anesthesiologist and CRNA  Anesthesia Plan Comments:         Anesthesia Quick Evaluation

## 2022-04-14 NOTE — Anesthesia Postprocedure Evaluation (Signed)
Anesthesia Post Note  Patient: Location manager  Procedure(s) Performed: LEFT BREAST RE-EXCISION LUMPECTOMY (Left: Breast)     Patient location during evaluation: PACU Anesthesia Type: General Level of consciousness: sedated and awake Pain management: pain level controlled Vital Signs Assessment: post-procedure vital signs reviewed and stable Respiratory status: spontaneous breathing Cardiovascular status: stable Postop Assessment: no apparent nausea or vomiting Anesthetic complications: no   No notable events documented.  Last Vitals:  Vitals:   04/14/22 1415 04/14/22 1424  BP: 115/71   Pulse: 62 63  Resp: (!) 21 10  Temp:    SpO2: 100% 94%    Last Pain:  Vitals:   04/14/22 1424  TempSrc:   PainSc: [place] Jr

## 2022-04-14 NOTE — Op Note (Signed)
Preoperative diagnosis: Clinical stage I left breast cancer, clinical stage 0 right breast cancer Postoperative diagnosis: Same as above Procedure: re-excision left breast lumpectomy Surgeon: Dr. Serita Grammes Anesthesia: General with a left pectoral block Estimated blood loss: 50 cc Complications: None Drains: None Specimens:left breast lateral and inferior margins marked short superior, long lateral and double deep Sponge needle count was correct at completion Decision to recovery stable condition  Indications: 41 y.o. female with no prior breast history. She does have a history of a renal transplant in 2013 from which she is doing very well. She is noted some discharge on her night shirt in the morning. This is yellow and may somewhat be bloody. This prompted an evaluation. She underwent mammograms and ultrasound. She has C density breast. She was noted in the medial right breast to have coarse calcifications spanning up to 7 cm. There are also similar-appearing calcifications in the medial left breast as well spanning 6 cm. There is a focal distortion at the anterior and posterior groups of calcifications in the left side. Ultrasound shows that there are 2 irregular hypoechoic masses at 10:00 3 cm from the nipple and at 10:00 5 cm from the nipple measuring 1.6 and 0.6 cm respectively. Left axilla shows no adenopathy. She underwent a biopsy of both of the left breast masses. She also underwent biopsy of the right-sided lesions as well. Her clips on the left side are 3.4 cm apart the clips on the right side are 5.1 cm apart. 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 We elected at attempt bilateral lumpectomies with left ax sn biopsy. the right side is clear and the left side has a positive margin with another one that is close.    Procedure:  After informed consent was obtained she was taken to the OR. She was given antibiotics.  SCDs were placed.  She was then placed under general anesthesia without complication.  She was prepped and draped in the standard sterile surgical fashion.  A surgical timeout was then performed.   I reentered the left sided incision and released the sutures.   I then removed the lateral and the inferior margins and marked them as above.  I then obtained hemostasis.  I then reclosed the tissue after mobilizing it with 2-0 Vicryl suture.  The skin was closed with 3-0 Vicryl and 4 Monocryl.  Glue and Steri-Strips were later applied. She tolerated this well was extubated transferred to recovery stable.

## 2022-04-14 NOTE — Transfer of Care (Signed)
Immediate Anesthesia Transfer of Care Note  Patient: Wendelyn Kiesling  Procedure(s) Performed: LEFT BREAST RE-EXCISION LUMPECTOMY (Left: Breast)  Patient Location: PACU  Anesthesia Type:General  Level of Consciousness: drowsy  Airway & Oxygen Therapy: Patient Spontanous Breathing and Patient connected to face mask oxygen  Post-op Assessment: Report given to RN and Post -op Vital signs reviewed and stable  Post vital signs: Reviewed and stable  Last Vitals:  Vitals Value Taken Time  BP 105/77 04/14/22 1347  Temp    Pulse 81 04/14/22 1350  Resp 21 04/14/22 1350  SpO2 100 % 04/14/22 1350  Vitals shown include unvalidated device data.  Last Pain:  Vitals:   04/14/22 1032  TempSrc: Oral  PainSc: 0-No pain         Complications: No notable events documented.

## 2022-04-14 NOTE — Discharge Instructions (Addendum)
No Tylenol until 4:45 today if needed.  [place] Office Phone Number [phone]  POST OP INSTRUCTIONS Take 400 mg of ibuprofen every 8 hours or 650 mg tylenol every 6 hours for next 72 hours then as needed. Use ice several times daily also.  A prescription for pain medication may be given to you upon discharge.  Take your pain medication as prescribed, if needed.  If narcotic pain medicine is not needed, then you may take acetaminophen (Tylenol), naprosyn (Alleve) or ibuprofen (Advil) as needed. Take your usually prescribed medications unless otherwise directed If you need a refill on your pain medication, please contact your pharmacy.  They will contact our office to request authorization.  Prescriptions will not be filled after 5pm or on week-ends. You should eat very light the first 24 hours after surgery, such as soup, crackers, pudding, etc.  Resume your normal diet the day after surgery. Most patients will experience some swelling and bruising in the breast.  Ice packs and a good support bra will help.  Wear the breast binder provided or a sports bra for 72 hours day and night.  After that wear a sports bra during the day until you return to the office. Swelling and bruising can take several days to resolve.  It is common to experience some constipation if taking pain medication after surgery.  Increasing fluid intake and taking a stool softener will usually help or prevent this problem from occurring.  A mild laxative (Milk of Magnesia or Miralax) should be taken according to package directions if there are no bowel movements after 48 hours. I used skin glue on the incision, you may shower in 24 hours.  The glue will flake off over the next 2-3 weeks.  Any sutures or staples will be removed at the office during your follow-up visit. ACTIVITIES:  You may resume regular daily activities (gradually increasing) beginning the next day.  Wearing a good support bra or sports bra  minimizes pain and swelling.  You may have sexual intercourse when it is comfortable. You may drive when you no longer are taking prescription pain medication, you can comfortably wear a seatbelt, and you can safely maneuver your car and apply brakes. RETURN TO WORK:  ______________________________________________________________________________________ Dennis Bast should see your doctor in the office for a follow-up appointment approximately two weeks after your surgery.  Your doctor's nurse will typically make your follow-up appointment when she calls you with your pathology report.  Expect your pathology report 3-4 business days after your surgery.  You may call to check if you do not hear from [place] after three days. OTHER INSTRUCTIONS: _______________________________________________________________________________________________ _____________________________________________________________________________________________________________________________________ _____________________________________________________________________________________________________________________________________ _____________________________________________________________________________________________________________________________________  WHEN TO CALL DR [place]: Fever over 101.0 Nausea and/or vomiting. Extreme swelling or bruising. Continued bleeding from incision. Increased pain, redness, or drainage from the incision.  The clinic staff is available to answer your questions during regular business hours.  Please don't hesitate to call and ask to speak to one of the nurses for clinical concerns.  If you have a medical emergency, go to the nearest emergency room or call 911.  A surgeon from [facility] is always on call at the hospital.  For further questions, please visit [email or web address]      Post Anesthesia Home Care Instructions  Activity: Get plenty of rest for the remainder  of the day. A responsible individual must stay with you for 24 hours following the procedure.  For the next 24 hours, DO NOT: -Drive a car -  Operate machinery -Drink alcoholic beverages -Take any medication unless instructed by your physician -Make any legal decisions or sign important papers.  Meals: Start with liquid foods such as gelatin or soup. Progress to regular foods as tolerated. Avoid greasy, spicy, heavy foods. If nausea and/or vomiting occur, drink only clear liquids until the nausea and/or vomiting subsides. Call your physician if vomiting continues.  Special Instructions/Symptoms: Your throat may feel dry or sore from the anesthesia or the breathing tube placed in your throat during surgery. If this causes discomfort, gargle with warm salt water. The discomfort should disappear within 24 hours.  If you had a scopolamine patch placed behind your ear for the management of post- operative nausea and/or vomiting:  1. The medication in the patch is effective for 72 hours, after which it should be removed.  Wrap patch in a tissue and discard in the trash. Wash hands thoroughly with soap and water. 2. You may remove the patch earlier than 72 hours if you experience unpleasant side effects which may include dry mouth, dizziness or visual disturbances. 3. Avoid touching the patch. Wash your hands with soap and water after contact with the patch.

## 2022-04-14 NOTE — H&P (Signed)
Raven Walker is an 41 y.o. female.   Chief Complaint: breast cancer HPI: 41 year old female who underwent bilateral lumpectomies. 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 I did take additional medial margin that is focally less than 1 mm. The superior margin is now 4 mm. The inferior margin is focally 1 mm away. This is ER and PR positive. The left side ends up being 2 tumor nodules with a 1.6 grade 1 invasive ductal carcinoma and negative margins as well as a 1 cm invasive ductal carcinoma with negative margins. The DCIS very focally involves and is broadly less than 1 mm from the lateral margin of the side. This is ER and PR positive, HER2 negative. I have already told her that I need to excise the left-sided margin for sure.oncotype was 3.. has had seroma that was drained in office not recurred.    Past Medical History:  Diagnosis Date   Anemia    history   BV (bacterial vaginosis) 06/04/2014   ESRD on peritoneal dialysis ([place]) 10/05/2006   pt had kidney transplant 06/2012 and is no longer on dialysis   GERD (gastroesophageal reflux disease) 09/04/2009   ulcerative esophagitis per EGD    H/O kidney transplant 06/19/2012   Hematuria 07/02/2014   Hypertension    Poor appetite    History   Secondary hyperparathyroidism ([facility] [place])    s/p parathyroidectomy with autotransplantation to left arm per Dr. Baker Pierini on 08/04/11   Streptococcal peritonitis ([place]) 06/06/2011   SVD (spontaneous vaginal delivery)    x 1   Urinary frequency 07/02/2014   UTI (lower urinary tract infection) 07/02/2014   Vaginal discharge 06/04/2014    Past Surgical History:  Procedure Laterality Date   arm graphs  2008   three - done at [facility]; still in but never used because pt got a transplant.   AXILLARY SENTINEL NODE BIOPSY Left 03/04/2022   Procedure: LEFT AXILLARY SENTINEL NODE BIOPSY;  Surgeon: Rolm Bookbinder, MD;  Location: [place];  Service: General;   Laterality: Left;   BREAST LUMPECTOMY WITH RADIOACTIVE SEED LOCALIZATION Bilateral 03/04/2022   Procedure: BILATERAL BRACKETED BREAST LUMPECTOMY WITH RADIOACTIVE SEED LOCALIZATION;  Surgeon: Rolm Bookbinder, MD;  Location: [place];  Service: General;  Laterality: Bilateral;   DILATATION & CURETTAGE/HYSTEROSCOPY WITH TRUECLEAR N/A 04/16/2014   Procedure: DILATATION & CURETTAGE/HYSTEROSCOPY WITH TRUCLEAR AND RESECTION OF FIBROID/THERMACHOICE ABLATION;  Surgeon: Jonnie Kind, MD;  Location: [facility];  Service: Gynecology;  Laterality: N/A;   KIDNEY TRANSPLANT Right 2013   [place]  08/05/11   with graft of parathyroid into left upper arm.   WISDOM TOOTH EXTRACTION      Family History  Problem Relation Age of Onset   Hypertension Mother    Hypertension Father    Obesity Son    Cancer Maternal Uncle    Diabetes Maternal Grandmother    Stroke Maternal Grandfather    Social History:  reports that she quit smoking about 22 years ago. Her smoking use included cigarettes. She has a 0.50 pack-year smoking history. She has never used smokeless tobacco. She reports current alcohol use. She reports that she does not use drugs.  Allergies:  Allergies  Allergen Reactions   Nickel Itching and Rash    Only where touched    Medications Prior to Admission  Medication Sig Dispense Refill   acetaminophen (TYLENOL) 500 MG tablet Take 500-1,000 mg by mouth every 6 (six) hours as needed for  moderate pain.     calcitRIOL (ROCALTROL) 0.25 MCG capsule Take 0.5 mcg by mouth daily.     calcium carbonate (TUMS - DOSED IN MG ELEMENTAL CALCIUM) 500 MG chewable tablet Chew 1 tablet by mouth 2 (two) times daily.     diltiazem (CARDIZEM) 30 MG tablet Take 30 mg by mouth 2 (two) times daily.     furosemide (LASIX) 40 MG tablet Take 40 mg by mouth 2 (two) times daily as needed for edema.     Iron-FA-B Cmp-C-Biot-Probiotic (FUSION PLUS) CAPS Take 1 capsule by mouth daily.     magnesium oxide  (MAG-OX) 400 (240 Mg) MG tablet Take 1 tablet by mouth 2 (two) times daily.     metoprolol tartrate (LOPRESSOR) 50 MG tablet Take 50 mg by mouth 2 (two) times daily.     Multiple Vitamins-Minerals (HAIR/SKIN/NAILS) TABS Take 2 tablets by mouth daily.     mycophenolate (CELLCEPT) 250 MG capsule Take 750 mg by mouth 2 (two) times daily.      pantoprazole (PROTONIX) 40 MG tablet Take 40 mg by mouth daily as needed (acid reflux).     tacrolimus (PROGRAF) 1 MG capsule Take 3 capsules (3 mg total) by mouth 2 (two) times daily.      No results found for this or any previous visit (from the past 48 hour(s)). No results found.  Review of Systems  Blood pressure 126/89, pulse 72, temperature 97.8 F (36.6 C), temperature source Oral, resp. rate 16, height $RemoveBe'5\' 9"'vnCQmZlTb$  (1.753 m), weight 103.4 kg, SpO2 100 %. Physical Exam   Bilateral breast incision and left axillary incision healing well without infection   Assessment/Plan Left breast reexcision lumpectomy I discussed reexcise in the lateral left margin. I sent a message to radiation and medical oncology about the right side. They are agreeable to proceed with radiation on that side  Rolm Bookbinder, MD 04/14/2022, 12:45 PM

## 2022-04-14 NOTE — Interval H&P Note (Signed)
History and Physical Interval Note:  04/14/2022 12:48 PM  Raven Walker  has presented today for surgery, with the diagnosis of LEFT BREAST CANCER.  The various methods of treatment have been discussed with the patient and family. After consideration of risks, benefits and other options for treatment, the patient has consented to  Procedure(s): LEFT BREAST RE-EXCISION LUMPECTOMY (Left) as a surgical intervention.  The patient's history has been reviewed, patient examined, no change in status, stable for surgery.  I have reviewed the patient's chart and labs.  Questions were answered to the patient's satisfaction.     Rolm Bookbinder

## 2022-04-14 NOTE — Anesthesia Procedure Notes (Signed)
Procedure Name: LMA Insertion Date/Time: 04/14/2022 12:58 PM  Performed by: Ezequiel Kayser, CRNAPre-anesthesia Checklist: Patient identified, Emergency Drugs available, Suction available and Patient being monitored Patient Re-evaluated:Patient Re-evaluated prior to induction Oxygen Delivery Method: Circle System Utilized Preoxygenation: Pre-oxygenation with 100% oxygen Induction Type: IV induction Ventilation: Mask ventilation without difficulty LMA: LMA inserted LMA Size: 4.0 Number of attempts: 1 Airway Equipment and Method: Bite block Placement Confirmation: positive ETCO2 Tube secured with: Tape Dental Injury: Teeth and Oropharynx as per pre-operative assessment

## 2022-04-15 ENCOUNTER — Encounter (HOSPITAL_BASED_OUTPATIENT_CLINIC_OR_DEPARTMENT_OTHER): Payer: Self-pay | Admitting: General Surgery

## 2022-04-16 ENCOUNTER — Encounter: Payer: Self-pay | Admitting: *Deleted

## 2022-04-16 DIAGNOSIS — D0511 Intraductal carcinoma in situ of right breast: Secondary | ICD-10-CM

## 2022-04-16 LAB — SURGICAL PATHOLOGY

## 2022-04-17 ENCOUNTER — Telehealth: Payer: Self-pay | Admitting: Radiation Oncology

## 2022-04-17 NOTE — Telephone Encounter (Signed)
7/14 @ 12:32 pm Left voicemail for patient to call our office to be schedule for consult with Dr. Sondra Come.

## 2022-04-22 ENCOUNTER — Encounter: Payer: Self-pay | Admitting: *Deleted

## 2022-04-28 ENCOUNTER — Telehealth: Payer: Self-pay | Admitting: *Deleted

## 2022-04-28 NOTE — Telephone Encounter (Signed)
Connected with Raven Walker [phone] (home)  Regarding the 04/27/2022 voicemail "Requesting return call.  Confused about situation I have."       "I need a letter for my employer to let them know I am to receive radiation for 4 to 6 weeks.  Out of work since Mar 03, 2022 for bilateral lumpectomy.  Dr. Donne Hazel went in left breast to remove more on 04/14/2022.  Approved out of work through 05/12/2022 but scheduled there 05/11/2022 and 05/12/2022 and do not know when radiation will begin.  Dr. Donne Hazel said he cannot write me out of work beyond 05/12/2022.  Will ask the radiation nurse on 05/11/2022 for a letter.  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 and Leave paperwork.  Advised of [facility] Authorization for Use/Disclosure required to process within  7 to 10 business days (14 calendar).  Confirmed e-mail address to send this and cover sheet to [email or web address].

## 2022-05-06 NOTE — Progress Notes (Signed)
Radiation Oncology         [phone]) [phone] ________________________________  Name: Raven Walker MRN: [id number]  Date: 05/11/2022  DOB: 28-Apr-1981  Re-Evaluation Note  CC: Celene Squibb, MD  Nicholas Lose, MD    ICD-10-CM   1. Ductal carcinoma in situ (DCIS) of right breast  D05.11     2. Malignant neoplasm of upper-outer quadrant of left breast in female, estrogen receptor positive ([place])  C50.412    Z17.0       Diagnosis:     S/p bilateral lumpectomies:   Stage 0 (cTis (DCIS), cN0, cM0) Right Breast, Intermediate to high-grade ductal carcinoma in-situ, ER+ / PR+ / Her2 not assessed  Stage IA (cT1c, cN0) Left Breast UOQ, 2 foci of invasive ductal carcinoma with intermediate grade DCIS measuring, ER+ / PR+ / Her2-, Grade 2  Narrative:  The patient returns today to discuss radiation treatment options. She was seen in consultation on 02/12/22.   She opted to proceed with bilateral bracketed lumpectomies with left axillary lymph node biopsies on 03/04/22 under the care of Dr. Donne Hazel. Pathology from the procedure revealed:  -- Right lumpectomy: intermediate to high-grade ductal carcinoma in-situ with necrosis and calcifications; all margins negative for DCIS. Prognostic indicators significant for: estrogen receptor 100% positive with strong staining intensity; progesterone receptor 70-90% positive with moderate-strong staining intensity; Her2 status not assessed. -- Left lumpectomy: 2 foci of invasive ductal carcinoma with intermediate grade DCIS measuring 1 cm and 1.6 cm respectively; all margins negative for invasive carcinoma and DCIS focally involving the lateral and inferior margins; nodal status of 4/4 left axillary SLN excisions negative for carcinoma. Prognostic indicators significant for: estrogen receptor 60% positive with moderate staining intensity; progesterone receptor 100% positive with strong staining intensity; Proliferation marker Ki67 at 1%; Her2 status  negative; Grade 2.   Oncotype DX was obtained on the final surgical sample and the recurrence score of 5 predicts a risk of recurrence outside the breast over the next 9 years of 3%, if the patient's only systemic therapy is an antiestrogen for 5 years.  It also predicts no significant benefit from chemotherapy.  Accordingly, the patient underwent re-excision of the left lateral and inferior margins on 04/14/22. Pathology revealed negative margins (negative for IDC or DCIS), with DCIS 0.4 mm from the new lateral margin, and 3 mm from the new inferior margin.   Post-op, the patient had a left axillary seroma which was aspirated once and did not recur.   Per her most recent follow up visit with Dr. Donne Hazel on 04/24/22, the patient denied any concerns and was noted to be healing well on examination.   Based on Oncotype results, the patient will return to Dr. Lindi Adie in the near future to discuss antiestrogen treatment options.   In recent history, the patient presented to the [facility] ED on 05/03/22 with complaints of fever, vomiting and diarrhea. 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 Accordingly, the patient was transferred to [facility] for admission and  further treatment consisting of antibiotics and IV fluids. The patient was also treated for viral gastroenteritis and discharged home on 05/05/22.   On review of systems, she denies any discomfort within the left or right breast area.  She denies any problems with swelling in her left arm or hand.   Allergies:  is allergic to nickel.  Meds: Current Outpatient Medications  Medication Sig Dispense Refill   acetaminophen (  TYLENOL) 500 MG tablet Take 500-1,000 mg by mouth every 6 (six) hours as needed for moderate pain.     calcitRIOL (ROCALTROL) 0.25 MCG capsule Take 0.5 mcg by mouth daily.     calcium carbonate (TUMS - DOSED IN MG  ELEMENTAL CALCIUM) 500 MG chewable tablet Chew 1 tablet by mouth 2 (two) times daily.     cefUROXime (CEFTIN) 500 MG tablet Take 1 tablet by mouth 2 (two) times daily.     diltiazem (CARDIZEM) 30 MG tablet Take 30 mg by mouth 2 (two) times daily.     furosemide (LASIX) 40 MG tablet Take 40 mg by mouth 2 (two) times daily as needed for edema.     Iron-FA-B Cmp-C-Biot-Probiotic (FUSION PLUS) CAPS Take 1 capsule by mouth daily.     magnesium oxide (MAG-OX) 400 (240 Mg) MG tablet Take 1 tablet by mouth 2 (two) times daily.     metoprolol tartrate (LOPRESSOR) 50 MG tablet Take 50 mg by mouth 2 (two) times daily.     Multiple Vitamins-Minerals (HAIR/SKIN/NAILS) TABS Take 2 tablets by mouth daily.     mycophenolate (CELLCEPT) 250 MG capsule Take 750 mg by mouth 2 (two) times daily.      pantoprazole (PROTONIX) 40 MG tablet Take 40 mg by mouth daily as needed (acid reflux).     tacrolimus (PROGRAF) 1 MG capsule Take 3 capsules (3 mg total) by mouth 2 (two) times daily.     No current facility-administered medications for this encounter.    Physical Findings: The patient is in no acute distress. Patient is alert and oriented.  height is $RemoveB'5\' 9"'IJUSFJyA$  (1.753 m) and weight is 230 lb 8 oz (104.6 kg). Her temporal temperature is 97 F (36.1 C) (abnormal). Her blood pressure is 146/95 (abnormal) and her pulse is 73. Her respiration is 18 and oxygen saturation is 100%.   Lungs are clear to auscultation bilaterally. Heart has regular rate and rhythm. No palpable cervical, supraclavicular, or axillary adenopathy. Abdomen soft, non-tender, normal bowel sounds. Left breast: no palpable mass, nipple discharge or bleeding.  Well-healing scar in the deep axillary region and upper outer quadrant of the breast.  No dominant mass appreciated in the breast nipple discharge or bleeding Right breast: Well-healing periareolar scar located in the upper inner aspect of the areola border.  Well-healed.  No signs of infection or  drainage within the breast area.  No dominant mass appreciated breast.  Lab Findings: Lab Results  Component Value Date   WBC 5.6 02/20/2022   HGB 10.7 (L) 02/20/2022   HCT 32.8 (L) 02/20/2022   MCV 82.6 02/20/2022   PLT 227 02/20/2022    Radiographic Findings: No results found.  Impression:  S/p bilateral lumpectomies:   Stage 0 (cTis (DCIS), cN0, cM0) Right Breast, Intermediate to high-grade ductal carcinoma in-situ, ER+ / PR+ / Her2 not assessed  Stage IA (cT1c, cN0) Left Breast UOQ, 2 foci of invasive ductal carcinoma with intermediate grade DCIS measuring, ER+ / PR+ / Her2-, Grade 2  She would be a good candidate for breast conserving therapy bilaterally with radiation therapy directed at the left and right breast area.  I discussed the overall course of treatment anticipated side effects and potential long-term toxicities of bilateral breast radiation therapy in detail with the patient.  She appears to understand and wishes to proceed with planned course of treatment.  Plan:  Patient is scheduled for CT simulation tomorrow.  Treatments to begin approximately a week later.  Anticipate 6-1/2 weeks  of radiation therapy directed at both the right and left breast area.  It is doubtful that she will be a candidate for hypofractionated accelerated radiation therapy given the potential for overlap over the sternum but will assess for this shorter course of treatment.  -----------------------------------  Blair Promise, PhD, MD  This document serves as a record of services personally performed by Gery Pray, MD. It was created on his behalf by Roney Mans, a trained medical scribe. The creation of this record is based on the scribe's personal observations and the provider's statements to them. This document has been checked and approved by the attending provider.

## 2022-05-08 NOTE — Progress Notes (Addendum)
Location of Breast Cancer:  Malignant neoplasm of upper-outer quadrant of left breast in female, estrogen receptor positive  Ductal carcinoma in situ (DCIS) of right breast    Histology per Pathology Report:  04/14/2022 A. BREAST, LEFT LATERAL MARGIN, RE-EXCISION:  Ductal carcinoma in situ, intermediate nuclear grade, cribriform and  micropapillary types  Negative for invasive carcinoma  Margin free (DCIS 0.4 mm from new lateral margin)  Changes consistent with prior procedure  Background fibrocystic changes including extensive stromal fibrosis   B. BREAST, LEFT INFERIOR MARGIN, RE-EXCISION:  Focal ductal carcinoma in situ, intermediate nuclear grade, cribriform  and micropapillary types  Negative for invasive carcinoma  Margin free (DCIS 3 mm from new inferior margin)  Changes consistent with prior procedure  Background fibrocystic changes including stromal fibrosis, adenosis and  focal apocrine metaplasia   03/04/2022 A. BREAST, RIGHT, LUMPECTOMY:  - Intermediate to high-grade ductal carcinoma in situ with necrosis and  calcifications  - Resection margins are negative for DCIS - closest is posterior margin  at 0.2 cm  - Biopsy site changes  - Fibrocystic change with a small intraductal papilloma, 0.3 cm  - See oncology table   B. RADIOACTIVE SEED, REMOVAL:  - Please see gross description   C. BREAST, RIGHT ADDITIONAL MEDIAL MARGIN, EXCISION:  - Ductal carcinoma in situ, intermediate grade with focal necrosis  - DCIS is focally less than 1 mm from medial margin  - Fibrocystic change with calcifications   D. BREAST, RIGHT ADDITIONAL SUPERIOR MARGIN, EXCISION:  - Ductal carcinoma in situ, intermediate grade with calcifications  - DCIS is 0.4 cm from new superior margin   01/22/2022 1. Breast, right, needle core biopsy, medial mid, ribbon clip - DUCTAL CARCINOMA IN SITU WITH CALCIFICATIONS AND NECROSIS - SEE COMMENT 2. Breast, right, needle core biopsy, lower inner,  coil clip - DUCTAL CARCINOMA IN SITU WITH CALCIFICATIONS AND NECROSIS - SEE COMMENT   01/13/2022 FINAL MICROSCOPIC DIAGNOSIS:  A. BREAST, 10:00 MASS, LEFT, BIOPSY:  - Invasive ductal carcinoma, grade 2/3.  - See comment.  B. BREAST, 10:00, MASS, LEFT, BIOPSY:  - Invasive ductal carcinoma, grade 2/3.  - See comment.  COMMENT:  The greatest tumor dimension is 1.6 cm.  Breast prognostic profile will be performed.   Receptor Status:  Right breast (medial mid): ER(100%), PR (70%)  Right breast (lower inner): ER(100%), PR (90%)   Left breast (10 o'clock): ER(60%), PR (100%), Her2-neu (Negative), Ki-67(1%)   Did patient present with symptoms (if so, please note symptoms) or was this found on screening mammography?: Patient complained of bloody nipple discharge. Underwent mammograms on 01/06/2022, which showed 2 suspicious left breast masses 1.6 cm and 0.6 cm.  Ieft also showed right breast calcifications measuring 0.7 cm.   Past/Anticipated interventions by medical oncology, if any: Antiestrogen therapy to follow radiation.  Lymphedema issues, if any:  None     Pain issues, if any:  Patient denies    SAFETY ISSUES: Prior radiation? No Pacemaker/ICD? No Possible current pregnancy? No--reports her menstrual cycle did not resume after her ablation  Is the patient on methotrexate? No   Current Complaints / other details:  Patient reports she was discharged from [facility] last Thursday from a kidney infection.  Currently taking Ceftin.  She also needs FMLA paperwork filled out as soon as possible.  BP (!) 146/95 (BP Location: Right Arm, Patient Position: Sitting)   Pulse 73   Temp (!) 97 F (36.1 C) (Temporal)   Resp 18   Ht _0  (  1.753 m)   Wt 230 lb 8 oz (104.6 kg)   SpO2 100%   BMI 34.04 kg/m

## 2022-05-11 ENCOUNTER — Ambulatory Visit
Admission: RE | Admit: 2022-05-11 | Discharge: 2022-05-11 | Disposition: A | Discharge status: Home / Self Care | Payer: 59 | Source: Ambulatory Visit | Attending: Radiation Oncology | Admitting: Radiation Oncology

## 2022-05-11 ENCOUNTER — Encounter: Payer: Self-pay | Admitting: Radiation Oncology

## 2022-05-11 ENCOUNTER — Other Ambulatory Visit: Payer: Self-pay

## 2022-05-11 VITALS — BP 146/95 | HR 73 | Temp 97.0°F | Resp 18 | Ht 69.0 in | Wt 230.5 lb

## 2022-05-11 DIAGNOSIS — Z170 Estrogen receptor positive status [ER+]: Secondary | ICD-10-CM | POA: Diagnosis not present

## 2022-05-11 DIAGNOSIS — Z79899 Other long term (current) drug therapy: Secondary | ICD-10-CM | POA: Diagnosis not present

## 2022-05-11 DIAGNOSIS — D0511 Intraductal carcinoma in situ of right breast: Secondary | ICD-10-CM | POA: Diagnosis not present

## 2022-05-11 DIAGNOSIS — Z79624 Long term (current) use of inhibitors of nucleotide synthesis: Secondary | ICD-10-CM | POA: Insufficient documentation

## 2022-05-11 DIAGNOSIS — C50412 Malignant neoplasm of upper-outer quadrant of left female breast: Secondary | ICD-10-CM | POA: Diagnosis present

## 2022-05-11 DIAGNOSIS — Z79621 Long term (current) use of calcineurin inhibitor: Secondary | ICD-10-CM | POA: Insufficient documentation

## 2022-05-12 ENCOUNTER — Ambulatory Visit
Admission: RE | Admit: 2022-05-12 | Discharge: 2022-05-12 | Disposition: A | Discharge status: Home / Self Care | Payer: 59 | Source: Ambulatory Visit | Attending: Radiation Oncology | Admitting: Radiation Oncology

## 2022-05-12 ENCOUNTER — Other Ambulatory Visit: Payer: Self-pay

## 2022-05-12 DIAGNOSIS — Z510 Encounter for antineoplastic radiation therapy: Secondary | ICD-10-CM | POA: Diagnosis present

## 2022-05-12 DIAGNOSIS — C50412 Malignant neoplasm of upper-outer quadrant of left female breast: Secondary | ICD-10-CM | POA: Diagnosis present

## 2022-05-12 DIAGNOSIS — Z170 Estrogen receptor positive status [ER+]: Secondary | ICD-10-CM

## 2022-05-12 DIAGNOSIS — D0511 Intraductal carcinoma in situ of right breast: Secondary | ICD-10-CM

## 2022-05-14 ENCOUNTER — Encounter: Payer: Self-pay | Admitting: Oncology

## 2022-05-14 ENCOUNTER — Telehealth: Payer: Self-pay | Admitting: Oncology

## 2022-05-14 NOTE — Telephone Encounter (Signed)
Called Raven Walker with [employer].  He said they need a letter with the dates of disability, diagnosis code and last office note.  Letter and office note faxed successfully to [phone].

## 2022-05-18 ENCOUNTER — Encounter: Payer: Self-pay | Admitting: *Deleted

## 2022-05-19 ENCOUNTER — Ambulatory Visit: Payer: 59 | Admitting: Radiation Oncology

## 2022-05-19 NOTE — Telephone Encounter (Signed)
[place] request for further information received by this nurse 05/13/2022 completed today 05/19/2022 to provider in basket for review and signature.    Previously connected with Elmo Putt RN about above.  This nurse reported the [facility] Attending Physician Statement has been received by [facility], completed, provider signed and was returned to [facility] on May 12, 2022.

## 2022-05-20 ENCOUNTER — Ambulatory Visit: Payer: 59

## 2022-05-20 DIAGNOSIS — Z510 Encounter for antineoplastic radiation therapy: Secondary | ICD-10-CM | POA: Diagnosis not present

## 2022-05-21 ENCOUNTER — Ambulatory Visit: Payer: 59

## 2022-05-21 NOTE — Telephone Encounter (Addendum)
Today this nurse received [name] form signed by provider.  Successfully returned with 05/11/2022 note.  Original form in envelope addressed to patient address on file. [address] [place] [id number] Original copy to alphabetical file folder for patient pick up near registrar number one of [facility] appointment registration work area.  Copy to radiation bin designated for items to be scanned completes process.  No further instructions received, actions required or performed by this nurse.

## 2022-05-22 ENCOUNTER — Ambulatory Visit: Payer: 59

## 2022-05-22 ENCOUNTER — Encounter: Payer: Self-pay | Admitting: Licensed Clinical Social Worker

## 2022-05-25 ENCOUNTER — Ambulatory Visit: Payer: 59 | Admitting: Radiation Oncology

## 2022-05-25 ENCOUNTER — Ambulatory Visit: Payer: 59 | Admitting: Rehabilitation

## 2022-05-26 ENCOUNTER — Ambulatory Visit
Admission: RE | Admit: 2022-05-26 | Discharge: 2022-05-26 | Disposition: A | Discharge status: Home / Self Care | Payer: 59 | Source: Ambulatory Visit | Attending: Radiation Oncology | Admitting: Radiation Oncology

## 2022-05-26 ENCOUNTER — Other Ambulatory Visit: Payer: Self-pay

## 2022-05-26 ENCOUNTER — Encounter: Payer: Self-pay | Admitting: Rehabilitation

## 2022-05-26 ENCOUNTER — Ambulatory Visit: Payer: 59

## 2022-05-26 ENCOUNTER — Ambulatory Visit: Payer: 59 | Attending: General Surgery | Admitting: Rehabilitation

## 2022-05-26 DIAGNOSIS — Z170 Estrogen receptor positive status [ER+]: Secondary | ICD-10-CM | POA: Insufficient documentation

## 2022-05-26 DIAGNOSIS — R293 Abnormal posture: Secondary | ICD-10-CM | POA: Insufficient documentation

## 2022-05-26 DIAGNOSIS — C50412 Malignant neoplasm of upper-outer quadrant of left female breast: Secondary | ICD-10-CM | POA: Insufficient documentation

## 2022-05-26 DIAGNOSIS — C50411 Malignant neoplasm of upper-outer quadrant of right female breast: Secondary | ICD-10-CM | POA: Insufficient documentation

## 2022-05-26 DIAGNOSIS — Z510 Encounter for antineoplastic radiation therapy: Secondary | ICD-10-CM | POA: Diagnosis not present

## 2022-05-26 LAB — RAD ONC ARIA SESSION SUMMARY

## 2022-05-26 MED ORDER — RADIAPLEXRX EX GEL: Freq: Once | CUTANEOUS | Status: AC

## 2022-05-26 MED ORDER — ALRA NON-METALLIC DEODORANT (RAD-ONC)
1.0000 | Freq: Once | TOPICAL | Status: AC
  Administered 2022-05-26: 1 via TOPICAL

## 2022-05-26 NOTE — Progress Notes (Signed)
Pt here for patient teaching.    Pt given Radiation and You booklet, skin care instructions, Alra deodorant, and Radiaplex gel.    Reviewed areas of pertinence such as fatigue, hair loss in treatment field, skin changes, breast tenderness, and breast swelling .   Pt able to give teach back of to pat skin, use unscented/gentle soap, and drink plenty of water,apply Radiaplex bid, avoid applying anything to skin within 4 hours of treatment, avoid wearing an under wire bra, and to use an electric razor if they must shave.   Pt verbalizes understanding of information given and will contact nursing with any questions or concerns.

## 2022-05-26 NOTE — Therapy (Signed)
OUTPATIENT PHYSICAL THERAPY SOZO SCREENING NOTE   Patient Name: Raven Walker MRN: [id number] DOB:November 19, 1980, 41 y.o., female 63 Date: 05/26/2022  PCP: Celene Squibb, MD REFERRING PROVIDER: Rolm Bookbinder, MD   PT End of Session - 05/26/22 1259     Visit Number 2   screen only   PT Start Time 1300    PT Stop Time 1304    PT Time Calculation (min) 4 min    Activity Tolerance Patient tolerated treatment well    Behavior During Therapy [facility] for tasks assessed/performed             Past Medical History:  Diagnosis Date   Anemia    history   BV (bacterial vaginosis) 06/04/2014   ESRD on peritoneal dialysis ([place]) 10/05/2006   pt had kidney transplant 06/2012 and is no longer on dialysis   GERD (gastroesophageal reflux disease) 09/04/2009   ulcerative esophagitis per EGD    H/O kidney transplant 06/19/2012   Hematuria 07/02/2014   Hypertension    Poor appetite    History   Secondary hyperparathyroidism ([place])    s/p parathyroidectomy with autotransplantation to left arm per Dr. Baker Pierini on 08/04/11   Streptococcal peritonitis ([place]) 06/06/2011   SVD (spontaneous vaginal delivery)    x 1   Urinary frequency 07/02/2014   UTI (lower urinary tract infection) 07/02/2014   Vaginal discharge 06/04/2014   Past Surgical History:  Procedure Laterality Date   arm graphs  2008   three - done at [facility]; still in but never used because pt got a transplant.   AXILLARY SENTINEL NODE BIOPSY Left 03/04/2022   Procedure: LEFT AXILLARY SENTINEL NODE BIOPSY;  Surgeon: Rolm Bookbinder, MD;  Location: [place];  Service: General;  Laterality: Left;   BREAST LUMPECTOMY WITH RADIOACTIVE SEED LOCALIZATION Bilateral 03/04/2022   Procedure: BILATERAL BRACKETED BREAST LUMPECTOMY WITH RADIOACTIVE SEED LOCALIZATION;  Surgeon: Rolm Bookbinder, MD;  Location: [place];  Service: General;  Laterality: Bilateral;   DILATATION & CURETTAGE/HYSTEROSCOPY WITH TRUECLEAR N/A 04/16/2014    Procedure: DILATATION & CURETTAGE/HYSTEROSCOPY WITH TRUCLEAR AND RESECTION OF FIBROID/THERMACHOICE ABLATION;  Surgeon: Jonnie Kind, MD;  Location: [facility];  Service: Gynecology;  Laterality: N/A;   KIDNEY TRANSPLANT Right 2013   [place]  08/05/11   with graft of parathyroid into left upper arm.   RE-EXCISION OF BREAST LUMPECTOMY Left 04/14/2022   Procedure: LEFT BREAST RE-EXCISION LUMPECTOMY;  Surgeon: Rolm Bookbinder, MD;  Location: [place];  Service: General;  Laterality: Left;   [place] EXTRACTION     Patient Active Problem List   Diagnosis Date Noted   Malignant neoplasm of upper-outer quadrant of left breast in female, estrogen receptor positive ([place]) 02/09/2022   Ductal carcinoma in situ (DCIS) of right breast 02/09/2022   Fibroids 01/06/2018   Abnormal uterine bleeding (AUB) 08/15/2015   Menorrhagia with regular cycle 03/14/2015   Urinary frequency 07/02/2014   Hematuria 07/02/2014   UTI (lower urinary tract infection) 07/02/2014   Vaginal discharge 06/04/2014   BV (bacterial vaginosis) 06/04/2014   Post-operative state 05/02/2014   Submucous uterine fibroid with menorrhagia 02/05/2014   Sepsis ([place]) 11/20/2013   UTI (urinary tract infection) 11/20/2013   Myalgia 11/20/2013   Acute on chronic renal failure ([place]) 11/20/2013   Leukocytosis, unspecified 11/20/2013   Menorrhagia, premenopausal 11/15/2013   Fever 08/30/2013   Acute pyelonephritis 06/29/2013   History of renal transplant 06/29/2013   Other ureteric obstruction 08/11/2012   Lymphocele 07/15/2012  Edema of both legs 07/04/2012   Immunosuppression ([place]) 06/28/2012   Hypocalcemia syndrome 08/19/2011    Class: Acute   Renal failure (ARF), acute on chronic ([place]) 08/19/2011    Class: Acute   HTN (hypertension) 08/19/2011    Class: Acute   GERD (gastroesophageal reflux disease) 08/19/2011    Class: Chronic   Hyperparathyroidism, secondary ([place]) 05/19/2011     REFERRING DIAG: bilateral breast cancer at risk for lymphedema on Left only  THERAPY DIAG:  Malignant neoplasm of upper-outer quadrant of right breast in female, estrogen receptor positive ([place])  Malignant neoplasm of upper-outer quadrant of left breast in female, estrogen receptor positive ([place])  Abnormal posture  PERTINENT HISTORY: Patient was diagnosed on 01/26/2022 with bilateral  breast Cancer with Right  gr. 2 DCIS ER, PR + and left Invasive ductal carcinoma. It measures .6 cm and is located in the upper outer quadrant. It is ER, PR +, Her 2 - with a Ki67 of 1%. She is s/p bilateral breast lumpectomies and left SLNB on 03/04/2022.  She is scheduled for a left breast re-excision on 04/14/2022. She is also to have radiation and anti estrogen. She is s/p a kidney transplant in 2013 and is doing very well  PRECAUTIONS: left UE Lymphedema risk  SUBJECTIVE: Doing well   PAIN:  Are you having pain? No  SOZO SCREENING: Patient was assessed today using the SOZO machine to determine the lymphedema index score. This was compared to her baseline score. It was determined that she is within the recommended range when compared to her baseline and no further action is needed at this time. She will continue SOZO screenings. These are done every 3 months for 2 years post operatively followed by every 6 months for 2 years, and then annually.   Stark Bray, PT 05/26/2022, 1:05 PM

## 2022-05-27 ENCOUNTER — Other Ambulatory Visit: Payer: Self-pay

## 2022-05-27 ENCOUNTER — Ambulatory Visit
Admission: RE | Admit: 2022-05-27 | Discharge: 2022-05-27 | Disposition: A | Discharge status: Home / Self Care | Payer: 59 | Source: Ambulatory Visit | Attending: Radiation Oncology | Admitting: Radiation Oncology

## 2022-05-27 ENCOUNTER — Telehealth: Payer: Self-pay | Admitting: Oncology

## 2022-05-27 ENCOUNTER — Encounter: Payer: Self-pay | Admitting: Oncology

## 2022-05-27 DIAGNOSIS — Z510 Encounter for antineoplastic radiation therapy: Secondary | ICD-10-CM | POA: Diagnosis not present

## 2022-05-27 LAB — RAD ONC ARIA SESSION SUMMARY

## 2022-05-27 NOTE — Telephone Encounter (Signed)
Raven Walker called and needs a letter saying that she needs to be out of work until 07/27/2022 for her FMLA.  Advised we will have the letter for her tomorrow when she is here for treatment.

## 2022-05-28 ENCOUNTER — Ambulatory Visit
Admission: RE | Admit: 2022-05-28 | Discharge: 2022-05-28 | Disposition: A | Discharge status: Home / Self Care | Payer: 59 | Source: Ambulatory Visit | Attending: Radiation Oncology | Admitting: Radiation Oncology

## 2022-05-28 ENCOUNTER — Other Ambulatory Visit: Payer: Self-pay

## 2022-05-28 DIAGNOSIS — Z510 Encounter for antineoplastic radiation therapy: Secondary | ICD-10-CM | POA: Diagnosis not present

## 2022-05-28 LAB — RAD ONC ARIA SESSION SUMMARY

## 2022-05-29 ENCOUNTER — Other Ambulatory Visit: Payer: Self-pay

## 2022-05-29 ENCOUNTER — Telehealth: Payer: Self-pay | Admitting: *Deleted

## 2022-05-29 ENCOUNTER — Ambulatory Visit
Admission: RE | Admit: 2022-05-29 | Discharge: 2022-05-29 | Disposition: A | Discharge status: Home / Self Care | Payer: 59 | Source: Ambulatory Visit | Attending: Radiation Oncology | Admitting: Radiation Oncology

## 2022-05-29 DIAGNOSIS — Z510 Encounter for antineoplastic radiation therapy: Secondary | ICD-10-CM | POA: Diagnosis not present

## 2022-05-29 LAB — RAD ONC ARIA SESSION SUMMARY

## 2022-05-29 NOTE — Telephone Encounter (Signed)
Voicemail received from [place] [phone] (home)  requesting return call for questions about forms.  Message left requesting return call to this nurse if she continue to have questions or needs regarding forms recently completed.

## 2022-06-01 ENCOUNTER — Other Ambulatory Visit: Payer: Self-pay

## 2022-06-01 ENCOUNTER — Ambulatory Visit
Admission: RE | Admit: 2022-06-01 | Discharge: 2022-06-01 | Disposition: A | Discharge status: Home / Self Care | Payer: 59 | Source: Ambulatory Visit | Attending: Radiation Oncology | Admitting: Radiation Oncology

## 2022-06-01 DIAGNOSIS — Z510 Encounter for antineoplastic radiation therapy: Secondary | ICD-10-CM | POA: Diagnosis not present

## 2022-06-01 LAB — RAD ONC ARIA SESSION SUMMARY

## 2022-06-02 ENCOUNTER — Ambulatory Visit: Payer: 59

## 2022-06-02 ENCOUNTER — Ambulatory Visit
Admission: RE | Admit: 2022-06-02 | Discharge: 2022-06-02 | Disposition: A | Discharge status: Home / Self Care | Payer: 59 | Source: Ambulatory Visit | Attending: Radiation Oncology | Admitting: Radiation Oncology

## 2022-06-02 ENCOUNTER — Other Ambulatory Visit: Payer: Self-pay

## 2022-06-02 DIAGNOSIS — Z510 Encounter for antineoplastic radiation therapy: Secondary | ICD-10-CM | POA: Diagnosis not present

## 2022-06-02 LAB — RAD ONC ARIA SESSION SUMMARY

## 2022-06-03 ENCOUNTER — Other Ambulatory Visit: Payer: Self-pay

## 2022-06-03 ENCOUNTER — Ambulatory Visit
Admission: RE | Admit: 2022-06-03 | Discharge: 2022-06-03 | Disposition: A | Discharge status: Home / Self Care | Payer: 59 | Source: Ambulatory Visit | Attending: Radiation Oncology | Admitting: Radiation Oncology

## 2022-06-03 DIAGNOSIS — Z510 Encounter for antineoplastic radiation therapy: Secondary | ICD-10-CM | POA: Diagnosis not present

## 2022-06-03 LAB — RAD ONC ARIA SESSION SUMMARY

## 2022-06-04 ENCOUNTER — Ambulatory Visit
Admission: RE | Admit: 2022-06-04 | Discharge: 2022-06-04 | Disposition: A | Discharge status: Home / Self Care | Payer: 59 | Source: Ambulatory Visit | Attending: Radiation Oncology | Admitting: Radiation Oncology

## 2022-06-04 ENCOUNTER — Other Ambulatory Visit: Payer: Self-pay

## 2022-06-04 DIAGNOSIS — Z510 Encounter for antineoplastic radiation therapy: Secondary | ICD-10-CM | POA: Diagnosis not present

## 2022-06-04 LAB — RAD ONC ARIA SESSION SUMMARY

## 2022-06-05 ENCOUNTER — Ambulatory Visit
Admission: RE | Admit: 2022-06-05 | Discharge: 2022-06-05 | Disposition: A | Discharge status: Home / Self Care | Payer: 59 | Source: Ambulatory Visit | Attending: Radiation Oncology | Admitting: Radiation Oncology

## 2022-06-05 ENCOUNTER — Other Ambulatory Visit: Payer: Self-pay

## 2022-06-05 DIAGNOSIS — Z170 Estrogen receptor positive status [ER+]: Secondary | ICD-10-CM | POA: Insufficient documentation

## 2022-06-05 DIAGNOSIS — C50412 Malignant neoplasm of upper-outer quadrant of left female breast: Secondary | ICD-10-CM | POA: Diagnosis present

## 2022-06-05 DIAGNOSIS — Z510 Encounter for antineoplastic radiation therapy: Secondary | ICD-10-CM | POA: Insufficient documentation

## 2022-06-05 LAB — RAD ONC ARIA SESSION SUMMARY

## 2022-06-09 ENCOUNTER — Other Ambulatory Visit: Payer: Self-pay

## 2022-06-09 ENCOUNTER — Ambulatory Visit
Admission: RE | Admit: 2022-06-09 | Discharge: 2022-06-09 | Disposition: A | Discharge status: Home / Self Care | Payer: 59 | Source: Ambulatory Visit | Attending: Radiation Oncology | Admitting: Radiation Oncology

## 2022-06-09 DIAGNOSIS — Z510 Encounter for antineoplastic radiation therapy: Secondary | ICD-10-CM | POA: Diagnosis not present

## 2022-06-09 LAB — RAD ONC ARIA SESSION SUMMARY
Course Elapsed Days: 14
Plan Fractions Treated to Date: 10
Plan Fractions Treated to Date: 10
Plan Prescribed Dose Per Fraction: 1.8 Gy
Plan Prescribed Dose Per Fraction: 1.8 Gy
Plan Total Fractions Prescribed: 28
Plan Total Fractions Prescribed: 28
Plan Total Prescribed Dose: 50.4 Gy
Plan Total Prescribed Dose: 50.4 Gy
Reference Point Dosage Given to Date: 18 Gy
Reference Point Dosage Given to Date: 18 Gy
Reference Point Session Dosage Given: 1.8 Gy
Reference Point Session Dosage Given: 1.8 Gy
Session Number: 10

## 2022-06-10 ENCOUNTER — Ambulatory Visit
Admission: RE | Admit: 2022-06-10 | Discharge: 2022-06-10 | Disposition: A | Discharge status: Home / Self Care | Payer: 59 | Source: Ambulatory Visit | Attending: Radiation Oncology | Admitting: Radiation Oncology

## 2022-06-10 ENCOUNTER — Other Ambulatory Visit: Payer: Self-pay

## 2022-06-10 DIAGNOSIS — Z510 Encounter for antineoplastic radiation therapy: Secondary | ICD-10-CM | POA: Diagnosis not present

## 2022-06-10 LAB — RAD ONC ARIA SESSION SUMMARY

## 2022-06-11 ENCOUNTER — Other Ambulatory Visit: Payer: Self-pay

## 2022-06-11 ENCOUNTER — Ambulatory Visit
Admission: RE | Admit: 2022-06-11 | Discharge: 2022-06-11 | Disposition: A | Discharge status: Home / Self Care | Payer: 59 | Source: Ambulatory Visit | Attending: Radiation Oncology | Admitting: Radiation Oncology

## 2022-06-11 DIAGNOSIS — Z510 Encounter for antineoplastic radiation therapy: Secondary | ICD-10-CM | POA: Diagnosis not present

## 2022-06-11 LAB — RAD ONC ARIA SESSION SUMMARY
Course Elapsed Days: 16
Plan Fractions Treated to Date: 12
Plan Fractions Treated to Date: 12
Plan Prescribed Dose Per Fraction: 1.8 Gy
Plan Prescribed Dose Per Fraction: 1.8 Gy
Plan Total Fractions Prescribed: 28
Plan Total Fractions Prescribed: 28
Plan Total Prescribed Dose: 50.4 Gy
Plan Total Prescribed Dose: 50.4 Gy
Reference Point Dosage Given to Date: 21.6 Gy
Reference Point Dosage Given to Date: 21.6 Gy
Reference Point Session Dosage Given: 1.8 Gy
Reference Point Session Dosage Given: 1.8 Gy
Session Number: 12

## 2022-06-12 ENCOUNTER — Ambulatory Visit
Admission: RE | Admit: 2022-06-12 | Discharge: 2022-06-12 | Disposition: A | Discharge status: Home / Self Care | Payer: 59 | Source: Ambulatory Visit | Attending: Radiation Oncology | Admitting: Radiation Oncology

## 2022-06-12 ENCOUNTER — Other Ambulatory Visit: Payer: Self-pay

## 2022-06-12 DIAGNOSIS — Z510 Encounter for antineoplastic radiation therapy: Secondary | ICD-10-CM | POA: Diagnosis not present

## 2022-06-12 LAB — RAD ONC ARIA SESSION SUMMARY
Course Elapsed Days: 17
Plan Fractions Treated to Date: 13
Plan Fractions Treated to Date: 13
Plan Prescribed Dose Per Fraction: 1.8 Gy
Plan Prescribed Dose Per Fraction: 1.8 Gy
Plan Total Fractions Prescribed: 28
Plan Total Fractions Prescribed: 28
Plan Total Prescribed Dose: 50.4 Gy
Plan Total Prescribed Dose: 50.4 Gy
Reference Point Dosage Given to Date: 23.4 Gy
Reference Point Dosage Given to Date: 23.4 Gy
Reference Point Session Dosage Given: 1.8 Gy
Reference Point Session Dosage Given: 1.8 Gy
Session Number: 13

## 2022-06-15 ENCOUNTER — Ambulatory Visit
Admission: RE | Admit: 2022-06-15 | Discharge: 2022-06-15 | Disposition: A | Discharge status: Home / Self Care | Payer: 59 | Source: Ambulatory Visit | Attending: Radiation Oncology | Admitting: Radiation Oncology

## 2022-06-15 ENCOUNTER — Other Ambulatory Visit: Payer: Self-pay

## 2022-06-15 DIAGNOSIS — Z510 Encounter for antineoplastic radiation therapy: Secondary | ICD-10-CM | POA: Diagnosis not present

## 2022-06-15 LAB — RAD ONC ARIA SESSION SUMMARY
Course Elapsed Days: 20
Plan Fractions Treated to Date: 14
Plan Fractions Treated to Date: 14
Plan Prescribed Dose Per Fraction: 1.8 Gy
Plan Prescribed Dose Per Fraction: 1.8 Gy
Plan Total Fractions Prescribed: 28
Plan Total Fractions Prescribed: 28
Plan Total Prescribed Dose: 50.4 Gy
Plan Total Prescribed Dose: 50.4 Gy
Reference Point Dosage Given to Date: 25.2 Gy
Reference Point Dosage Given to Date: 25.2 Gy
Reference Point Session Dosage Given: 1.8 Gy
Reference Point Session Dosage Given: 1.8 Gy
Session Number: 14

## 2022-06-16 ENCOUNTER — Ambulatory Visit
Admission: RE | Admit: 2022-06-16 | Discharge: 2022-06-16 | Disposition: A | Discharge status: Home / Self Care | Payer: 59 | Source: Ambulatory Visit | Attending: Radiation Oncology | Admitting: Radiation Oncology

## 2022-06-16 ENCOUNTER — Other Ambulatory Visit: Payer: Self-pay

## 2022-06-16 ENCOUNTER — Ambulatory Visit: Payer: 59

## 2022-06-16 DIAGNOSIS — D0511 Intraductal carcinoma in situ of right breast: Secondary | ICD-10-CM

## 2022-06-16 DIAGNOSIS — Z510 Encounter for antineoplastic radiation therapy: Secondary | ICD-10-CM | POA: Diagnosis not present

## 2022-06-16 DIAGNOSIS — Z170 Estrogen receptor positive status [ER+]: Secondary | ICD-10-CM

## 2022-06-16 LAB — RAD ONC ARIA SESSION SUMMARY

## 2022-06-16 MED ORDER — RADIAPLEXRX EX GEL: Freq: Once | CUTANEOUS | Status: AC

## 2022-06-17 ENCOUNTER — Other Ambulatory Visit: Payer: Self-pay

## 2022-06-17 ENCOUNTER — Ambulatory Visit
Admission: RE | Admit: 2022-06-17 | Discharge: 2022-06-17 | Disposition: A | Discharge status: Home / Self Care | Payer: 59 | Source: Ambulatory Visit | Attending: Radiation Oncology | Admitting: Radiation Oncology

## 2022-06-17 ENCOUNTER — Ambulatory Visit: Payer: 59

## 2022-06-17 DIAGNOSIS — Z510 Encounter for antineoplastic radiation therapy: Secondary | ICD-10-CM | POA: Diagnosis not present

## 2022-06-17 LAB — RAD ONC ARIA SESSION SUMMARY
Course Elapsed Days: 22
Plan Fractions Treated to Date: 16
Plan Fractions Treated to Date: 16
Plan Prescribed Dose Per Fraction: 1.8 Gy
Plan Prescribed Dose Per Fraction: 1.8 Gy
Plan Total Fractions Prescribed: 28
Plan Total Fractions Prescribed: 28
Plan Total Prescribed Dose: 50.4 Gy
Plan Total Prescribed Dose: 50.4 Gy
Reference Point Dosage Given to Date: 28.8 Gy
Reference Point Dosage Given to Date: 28.8 Gy
Reference Point Session Dosage Given: 1.8 Gy
Reference Point Session Dosage Given: 1.8 Gy
Session Number: 16

## 2022-06-18 ENCOUNTER — Ambulatory Visit: Payer: 59

## 2022-06-18 ENCOUNTER — Other Ambulatory Visit: Payer: Self-pay

## 2022-06-18 ENCOUNTER — Ambulatory Visit
Admission: RE | Admit: 2022-06-18 | Discharge: 2022-06-18 | Disposition: A | Discharge status: Home / Self Care | Payer: 59 | Source: Ambulatory Visit | Attending: Radiation Oncology | Admitting: Radiation Oncology

## 2022-06-18 DIAGNOSIS — Z510 Encounter for antineoplastic radiation therapy: Secondary | ICD-10-CM | POA: Diagnosis not present

## 2022-06-18 LAB — RAD ONC ARIA SESSION SUMMARY
Course Elapsed Days: 23
Plan Fractions Treated to Date: 17
Plan Fractions Treated to Date: 17
Plan Prescribed Dose Per Fraction: 1.8 Gy
Plan Prescribed Dose Per Fraction: 1.8 Gy
Plan Total Fractions Prescribed: 28
Plan Total Fractions Prescribed: 28
Plan Total Prescribed Dose: 50.4 Gy
Plan Total Prescribed Dose: 50.4 Gy
Reference Point Dosage Given to Date: 30.6 Gy
Reference Point Dosage Given to Date: 30.6 Gy
Reference Point Session Dosage Given: 1.8 Gy
Reference Point Session Dosage Given: 1.8 Gy
Session Number: 17

## 2022-06-19 ENCOUNTER — Ambulatory Visit: Payer: 59

## 2022-06-19 ENCOUNTER — Other Ambulatory Visit: Payer: Self-pay

## 2022-06-19 ENCOUNTER — Ambulatory Visit
Admission: RE | Admit: 2022-06-19 | Discharge: 2022-06-19 | Disposition: A | Discharge status: Home / Self Care | Payer: 59 | Source: Ambulatory Visit | Attending: Radiation Oncology | Admitting: Radiation Oncology

## 2022-06-19 DIAGNOSIS — Z510 Encounter for antineoplastic radiation therapy: Secondary | ICD-10-CM | POA: Diagnosis not present

## 2022-06-19 LAB — RAD ONC ARIA SESSION SUMMARY
Course Elapsed Days: 24
Plan Fractions Treated to Date: 18
Plan Fractions Treated to Date: 18
Plan Prescribed Dose Per Fraction: 1.8 Gy
Plan Prescribed Dose Per Fraction: 1.8 Gy
Plan Total Fractions Prescribed: 28
Plan Total Fractions Prescribed: 28
Plan Total Prescribed Dose: 50.4 Gy
Plan Total Prescribed Dose: 50.4 Gy
Reference Point Dosage Given to Date: 32.4 Gy
Reference Point Dosage Given to Date: 32.4 Gy
Reference Point Session Dosage Given: 1.8 Gy
Reference Point Session Dosage Given: 1.8 Gy
Session Number: 18

## 2022-06-22 ENCOUNTER — Other Ambulatory Visit: Payer: Self-pay

## 2022-06-22 ENCOUNTER — Ambulatory Visit
Admission: RE | Admit: 2022-06-22 | Discharge: 2022-06-22 | Disposition: A | Discharge status: Home / Self Care | Payer: 59 | Source: Ambulatory Visit | Attending: Radiation Oncology | Admitting: Radiation Oncology

## 2022-06-22 DIAGNOSIS — Z510 Encounter for antineoplastic radiation therapy: Secondary | ICD-10-CM | POA: Diagnosis not present

## 2022-06-22 LAB — RAD ONC ARIA SESSION SUMMARY
Course Elapsed Days: 27
Plan Fractions Treated to Date: 19
Plan Fractions Treated to Date: 19
Plan Prescribed Dose Per Fraction: 1.8 Gy
Plan Prescribed Dose Per Fraction: 1.8 Gy
Plan Total Fractions Prescribed: 28
Plan Total Fractions Prescribed: 28
Plan Total Prescribed Dose: 50.4 Gy
Plan Total Prescribed Dose: 50.4 Gy
Reference Point Dosage Given to Date: 34.2 Gy
Reference Point Dosage Given to Date: 34.2 Gy
Reference Point Session Dosage Given: 1.8 Gy
Reference Point Session Dosage Given: 1.8 Gy
Session Number: 19

## 2022-06-23 ENCOUNTER — Ambulatory Visit
Admission: RE | Admit: 2022-06-23 | Discharge: 2022-06-23 | Disposition: A | Discharge status: Home / Self Care | Payer: 59 | Source: Ambulatory Visit | Attending: Radiation Oncology | Admitting: Radiation Oncology

## 2022-06-23 ENCOUNTER — Ambulatory Visit: Payer: 59

## 2022-06-23 ENCOUNTER — Other Ambulatory Visit: Payer: Self-pay

## 2022-06-23 DIAGNOSIS — Z510 Encounter for antineoplastic radiation therapy: Secondary | ICD-10-CM | POA: Diagnosis not present

## 2022-06-23 DIAGNOSIS — D0511 Intraductal carcinoma in situ of right breast: Secondary | ICD-10-CM

## 2022-06-23 DIAGNOSIS — C50412 Malignant neoplasm of upper-outer quadrant of left female breast: Secondary | ICD-10-CM

## 2022-06-23 LAB — RAD ONC ARIA SESSION SUMMARY
Course Elapsed Days: 28
Plan Fractions Treated to Date: 20
Plan Fractions Treated to Date: 20
Plan Prescribed Dose Per Fraction: 1.8 Gy
Plan Prescribed Dose Per Fraction: 1.8 Gy
Plan Total Fractions Prescribed: 28
Plan Total Fractions Prescribed: 28
Plan Total Prescribed Dose: 50.4 Gy
Plan Total Prescribed Dose: 50.4 Gy
Reference Point Dosage Given to Date: 36 Gy
Reference Point Dosage Given to Date: 36 Gy
Reference Point Session Dosage Given: 1.8 Gy
Reference Point Session Dosage Given: 1.8 Gy
Session Number: 20

## 2022-06-23 MED ORDER — RADIAPLEXRX EX GEL: Freq: Once | CUTANEOUS | Status: AC

## 2022-06-24 ENCOUNTER — Ambulatory Visit
Admission: RE | Admit: 2022-06-24 | Discharge: 2022-06-24 | Disposition: A | Discharge status: Home / Self Care | Payer: 59 | Source: Ambulatory Visit | Attending: Radiation Oncology | Admitting: Radiation Oncology

## 2022-06-24 ENCOUNTER — Telehealth: Payer: Self-pay

## 2022-06-24 ENCOUNTER — Other Ambulatory Visit: Payer: Self-pay

## 2022-06-24 DIAGNOSIS — Z510 Encounter for antineoplastic radiation therapy: Secondary | ICD-10-CM | POA: Diagnosis not present

## 2022-06-24 LAB — RAD ONC ARIA SESSION SUMMARY
Course Elapsed Days: 29
Plan Fractions Treated to Date: 21
Plan Fractions Treated to Date: 21
Plan Prescribed Dose Per Fraction: 1.8 Gy
Plan Prescribed Dose Per Fraction: 1.8 Gy
Plan Total Fractions Prescribed: 28
Plan Total Fractions Prescribed: 28
Plan Total Prescribed Dose: 50.4 Gy
Plan Total Prescribed Dose: 50.4 Gy
Reference Point Dosage Given to Date: 37.8 Gy
Reference Point Dosage Given to Date: 37.8 Gy
Reference Point Session Dosage Given: 1.8 Gy
Reference Point Session Dosage Given: 1.8 Gy
Session Number: 21

## 2022-06-24 NOTE — Telephone Encounter (Signed)
Notified Patient of completion of FMLA forms. Fax transmission confirmation received. Copy of forms mailed to Patient as requested. No other needs or concerns voiced at this time.

## 2022-06-25 ENCOUNTER — Other Ambulatory Visit: Payer: Self-pay

## 2022-06-25 ENCOUNTER — Ambulatory Visit
Admission: RE | Admit: 2022-06-25 | Discharge: 2022-06-25 | Disposition: A | Discharge status: Home / Self Care | Payer: 59 | Source: Ambulatory Visit | Attending: Radiation Oncology | Admitting: Radiation Oncology

## 2022-06-25 DIAGNOSIS — Z510 Encounter for antineoplastic radiation therapy: Secondary | ICD-10-CM | POA: Diagnosis not present

## 2022-06-25 LAB — RAD ONC ARIA SESSION SUMMARY
Course Elapsed Days: 30
Plan Fractions Treated to Date: 22
Plan Fractions Treated to Date: 22
Plan Prescribed Dose Per Fraction: 1.8 Gy
Plan Prescribed Dose Per Fraction: 1.8 Gy
Plan Total Fractions Prescribed: 28
Plan Total Fractions Prescribed: 28
Plan Total Prescribed Dose: 50.4 Gy
Plan Total Prescribed Dose: 50.4 Gy
Reference Point Dosage Given to Date: 39.6 Gy
Reference Point Dosage Given to Date: 39.6 Gy
Reference Point Session Dosage Given: 1.8 Gy
Reference Point Session Dosage Given: 1.8 Gy
Session Number: 22

## 2022-06-26 ENCOUNTER — Ambulatory Visit
Admission: RE | Admit: 2022-06-26 | Discharge: 2022-06-26 | Disposition: A | Discharge status: Home / Self Care | Payer: 59 | Source: Ambulatory Visit | Attending: Radiation Oncology | Admitting: Radiation Oncology

## 2022-06-26 ENCOUNTER — Other Ambulatory Visit: Payer: Self-pay

## 2022-06-26 DIAGNOSIS — Z510 Encounter for antineoplastic radiation therapy: Secondary | ICD-10-CM | POA: Diagnosis not present

## 2022-06-26 LAB — RAD ONC ARIA SESSION SUMMARY
Course Elapsed Days: 31
Plan Fractions Treated to Date: 23
Plan Fractions Treated to Date: 23
Plan Prescribed Dose Per Fraction: 1.8 Gy
Plan Prescribed Dose Per Fraction: 1.8 Gy
Plan Total Fractions Prescribed: 28
Plan Total Fractions Prescribed: 28
Plan Total Prescribed Dose: 50.4 Gy
Plan Total Prescribed Dose: 50.4 Gy
Reference Point Dosage Given to Date: 41.4 Gy
Reference Point Dosage Given to Date: 41.4 Gy
Reference Point Session Dosage Given: 1.8 Gy
Reference Point Session Dosage Given: 1.8 Gy
Session Number: 23

## 2022-06-29 ENCOUNTER — Other Ambulatory Visit: Payer: Self-pay

## 2022-06-29 ENCOUNTER — Ambulatory Visit
Admission: RE | Admit: 2022-06-29 | Discharge: 2022-06-29 | Disposition: A | Discharge status: Home / Self Care | Payer: 59 | Source: Ambulatory Visit | Attending: Radiation Oncology | Admitting: Radiation Oncology

## 2022-06-29 DIAGNOSIS — Z510 Encounter for antineoplastic radiation therapy: Secondary | ICD-10-CM | POA: Diagnosis not present

## 2022-06-29 LAB — RAD ONC ARIA SESSION SUMMARY
Course Elapsed Days: 34
Plan Fractions Treated to Date: 24
Plan Fractions Treated to Date: 24
Plan Prescribed Dose Per Fraction: 1.8 Gy
Plan Prescribed Dose Per Fraction: 1.8 Gy
Plan Total Fractions Prescribed: 28
Plan Total Fractions Prescribed: 28
Plan Total Prescribed Dose: 50.4 Gy
Plan Total Prescribed Dose: 50.4 Gy
Reference Point Dosage Given to Date: 43.2 Gy
Reference Point Dosage Given to Date: 43.2 Gy
Reference Point Session Dosage Given: 1.8 Gy
Reference Point Session Dosage Given: 1.8 Gy
Session Number: 24

## 2022-06-30 ENCOUNTER — Ambulatory Visit: Payer: 59

## 2022-06-30 ENCOUNTER — Ambulatory Visit: Admission: RE | Admit: 2022-06-30 | Payer: 59 | Source: Ambulatory Visit | Admitting: Radiation Oncology

## 2022-06-30 ENCOUNTER — Other Ambulatory Visit: Payer: Self-pay

## 2022-06-30 ENCOUNTER — Ambulatory Visit
Admission: RE | Admit: 2022-06-30 | Discharge: 2022-06-30 | Disposition: A | Discharge status: Home / Self Care | Payer: 59 | Source: Ambulatory Visit | Attending: Radiation Oncology | Admitting: Radiation Oncology

## 2022-06-30 ENCOUNTER — Ambulatory Visit: Payer: 59 | Admitting: Radiation Oncology

## 2022-06-30 DIAGNOSIS — Z510 Encounter for antineoplastic radiation therapy: Secondary | ICD-10-CM | POA: Diagnosis not present

## 2022-06-30 LAB — RAD ONC ARIA SESSION SUMMARY
Course Elapsed Days: 35
Plan Fractions Treated to Date: 25
Plan Prescribed Dose Per Fraction: 1.8 Gy
Plan Total Fractions Prescribed: 28
Plan Total Prescribed Dose: 50.4 Gy
Reference Point Dosage Given to Date: 45 Gy
Reference Point Session Dosage Given: 1.8 Gy
Session Number: 25

## 2022-07-01 ENCOUNTER — Ambulatory Visit
Admission: RE | Admit: 2022-07-01 | Discharge: 2022-07-01 | Disposition: A | Discharge status: Home / Self Care | Payer: 59 | Source: Ambulatory Visit | Attending: Radiation Oncology | Admitting: Radiation Oncology

## 2022-07-01 ENCOUNTER — Ambulatory Visit: Payer: 59

## 2022-07-01 ENCOUNTER — Other Ambulatory Visit: Payer: Self-pay

## 2022-07-01 ENCOUNTER — Ambulatory Visit: Payer: 59 | Admitting: Radiation Oncology

## 2022-07-01 DIAGNOSIS — Z510 Encounter for antineoplastic radiation therapy: Secondary | ICD-10-CM | POA: Diagnosis not present

## 2022-07-01 DIAGNOSIS — Z170 Estrogen receptor positive status [ER+]: Secondary | ICD-10-CM

## 2022-07-01 DIAGNOSIS — D0511 Intraductal carcinoma in situ of right breast: Secondary | ICD-10-CM

## 2022-07-01 LAB — RAD ONC ARIA SESSION SUMMARY
Course Elapsed Days: 36
Plan Fractions Treated to Date: 26
Plan Fractions Treated to Date: 26
Plan Prescribed Dose Per Fraction: 1.8 Gy
Plan Prescribed Dose Per Fraction: 1.8 Gy
Plan Total Fractions Prescribed: 28
Plan Total Fractions Prescribed: 28
Plan Total Prescribed Dose: 50.4 Gy
Plan Total Prescribed Dose: 50.4 Gy
Reference Point Dosage Given to Date: 46.8 Gy
Reference Point Dosage Given to Date: 46.8 Gy
Reference Point Session Dosage Given: 1.8 Gy
Reference Point Session Dosage Given: 1.8 Gy
Session Number: 26

## 2022-07-02 ENCOUNTER — Ambulatory Visit
Admission: RE | Admit: 2022-07-02 | Discharge: 2022-07-02 | Disposition: A | Discharge status: Home / Self Care | Payer: 59 | Source: Ambulatory Visit | Attending: Radiation Oncology | Admitting: Radiation Oncology

## 2022-07-02 ENCOUNTER — Other Ambulatory Visit: Payer: Self-pay

## 2022-07-02 DIAGNOSIS — Z510 Encounter for antineoplastic radiation therapy: Secondary | ICD-10-CM | POA: Diagnosis not present

## 2022-07-02 LAB — RAD ONC ARIA SESSION SUMMARY
Course Elapsed Days: 37
Plan Fractions Treated to Date: 27
Plan Fractions Treated to Date: 27
Plan Prescribed Dose Per Fraction: 1.8 Gy
Plan Prescribed Dose Per Fraction: 1.8 Gy
Plan Total Fractions Prescribed: 28
Plan Total Fractions Prescribed: 28
Plan Total Prescribed Dose: 50.4 Gy
Plan Total Prescribed Dose: 50.4 Gy
Reference Point Dosage Given to Date: 48.6 Gy
Reference Point Dosage Given to Date: 48.6 Gy
Reference Point Session Dosage Given: 1.8 Gy
Reference Point Session Dosage Given: 1.8 Gy
Session Number: 27

## 2022-07-02 NOTE — Progress Notes (Signed)
Patient Care Team: Benita Stabile, MD as PCP - General (Internal Medicine) Deterding, Fayrene Fearing, MD as Consulting Physician (Nephrology) Benita Stabile, MD (Internal Medicine) Pershing Proud, RN as Oncology Nurse Navigator Donnelly Angelica, RN as Oncology Nurse Navigator Serena Croissant, MD as Consulting Physician (Hematology and Oncology)  DIAGNOSIS:  Encounter Diagnosis  Name Primary?   Malignant neoplasm of upper-outer quadrant of left breast in female, estrogen receptor positive (HCC)     SUMMARY OF ONCOLOGIC HISTORY: Oncology History  Malignant neoplasm of upper-outer quadrant of left breast in female, estrogen receptor positive (HCC)  01/22/2022 Initial Diagnosis   Work-up performed for intermittent bloody left nipple discharge.  Mammogram and ultrasound revealed bilateral breast masses  Right breast 2 hypoechoic masses 1.6 cm and 0.6 cm.  No axillary lymph nodes.  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 Left lumpectomy: Grade 1 IDC 1.6 cm with DCIS margins negative; Left second lumpectomy: Grade 2 IDC 1 cm, DCIS, DCIS focally involves lateral margin, 0/3 lymph nodes negative     CHIEF COMPLIANT: Follow-up after radiation   INTERVAL HISTORY: Raven Walker is a 41 y.o. female is here because of recent diagnosis of bilateral breast cancers. She presents to the clinic today for a follow-up. 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     ALLERGIES:  is allergic to nickel.  MEDICATIONS:  Current  Outpatient Medications  Medication Sig Dispense Refill   [START ON 07/27/2022] tamoxifen (NOLVADEX) 20 MG tablet Take 1 tablet (20 mg total) by mouth daily. 90 tablet 3   acetaminophen (TYLENOL) 500 MG tablet Take 500-1,000 mg by mouth every 6 (six) hours as needed for moderate pain.     calcitRIOL (ROCALTROL) 0.25 MCG capsule Take 0.5 mcg by mouth daily.     calcium carbonate (TUMS - DOSED IN MG ELEMENTAL CALCIUM) 500 MG chewable tablet Chew 1 tablet by mouth 2 (two) times daily.     diltiazem (CARDIZEM) 30 MG tablet Take 30 mg by mouth 2 (two) times daily.     furosemide (LASIX) 40 MG tablet Take 40 mg by mouth 2 (two) times daily as needed for edema.     Iron-FA-B Cmp-C-Biot-Probiotic (FUSION PLUS) CAPS Take 1 capsule by mouth daily.     magnesium oxide (MAG-OX) 400 (240 Mg) MG tablet Take 1 tablet by mouth 2 (two) times daily.     metoprolol tartrate (LOPRESSOR) 50 MG tablet Take 50 mg by mouth 2 (two) times daily.     Multiple Vitamins-Minerals (HAIR/SKIN/NAILS) TABS Take 2 tablets by mouth daily.     mycophenolate (CELLCEPT) 250 MG capsule Take 750 mg by mouth 2 (two) times daily.      pantoprazole (PROTONIX) 40 MG tablet Take 40 mg by mouth daily as needed (acid reflux).     tacrolimus (PROGRAF) 1 MG capsule Take 3 capsules (3 mg total) by mouth 2 (two) times daily.     No current facility-administered medications for this visit.    PHYSICAL EXAMINATION: ECOG PERFORMANCE STATUS: 1 - Symptomatic but completely ambulatory  Vitals:   07/06/22 1405  BP: 137/86  Pulse: 87  Resp: 18  Temp: 97.7 F (36.5 C)  SpO2: 100%   Filed Weights   07/06/22 1405  Weight: 230 lb 1.6 oz (104.4 kg)      LABORATORY DATA:  I have reviewed the data as listed    Latest Ref Rng & Units 04/09/2022    3:23 PM 02/20/2022    3:20 PM 05/30/2015    9:25 AM  CMP  Glucose 70 - 99 mg/dL 99  101  99   BUN 6 - 20 mg/dL $Remove'17  21  16   'rMtsKYt$ Creatinine 0.44 - 1.00 mg/dL 1.33  1.44  1.55   Sodium 135 - 145 mmol/L  142  143  139   Potassium 3.5 - 5.1 mmol/L 3.9  3.0  3.7   Chloride 98 - 111 mmol/L 101  107  105   CO2 22 - 32 mmol/L $RemoveB'27  24  25   'eBMbXBRl$ Calcium 8.9 - 10.3 mg/dL 9.5  8.9  8.1     Lab Results  Component Value Date   WBC 5.6 02/20/2022   HGB 10.7 (L) 02/20/2022   HCT 32.8 (L) 02/20/2022   MCV 82.6 02/20/2022   PLT 227 02/20/2022   NEUTROABS 9.2 (H) 08/30/2013    ASSESSMENT & PLAN:  Malignant neoplasm of upper-outer quadrant of left breast in female, estrogen receptor positive ([place]) Bilateral lumpectomies 03/04/2022 Right lumpectomy: Intermediate grade to high-grade DCIS with necrosis and calcifications, right medial margin: Intermediate grade DCIS, right superior margin: Intermediate grade DCIS, final margins negative, right additional inferior margin: DCIS. Left lumpectomy: Grade 1 IDC 1.6 cm with DCIS margins negative; Left second lumpectomy: Grade 2 IDC 1 cm, DCIS, DCIS focally involves lateral margin, 0/3 lymph nodes negative   Treatment plan: 1. Oncotype DX score: 5 (ROR 3%) 2. Bilateral radiation therapies: 05/27/2022-07/13/2022 3.  Antiestrogen therapy to start 07/27/2022    Tamoxifen counseling: We discussed the risks and benefits of tamoxifen. These include but not limited to insomnia, hot flashes, mood changes, vaginal dryness, and weight gain. Although rare, serious side effects including endometrial cancer, risk of blood clots were also discussed. We strongly believe that the benefits far outweigh the risks. Patient understands these risks and consented to starting treatment. Planned treatment duration is 5-10 years.  Return to clinic in 3 months for survivorship care plan visit  No orders of the defined types were placed in this encounter.  The patient has a good understanding of the overall plan. she agrees with it. she will call with any problems that may develop before the next visit here. Total time spent: 30 mins including face to face time and time spent for planning,  charting and co-ordination of care   Harriette Ohara, MD 07/06/22    I Gardiner Coins am scribing for Dr. Lindi Adie  I have reviewed the above documentation for accuracy and completeness, and I agree with the above.

## 2022-07-03 ENCOUNTER — Ambulatory Visit
Admission: RE | Admit: 2022-07-03 | Discharge: 2022-07-03 | Disposition: A | Discharge status: Home / Self Care | Payer: 59 | Source: Ambulatory Visit | Attending: Radiation Oncology | Admitting: Radiation Oncology

## 2022-07-03 ENCOUNTER — Ambulatory Visit: Payer: 59

## 2022-07-03 ENCOUNTER — Other Ambulatory Visit: Payer: Self-pay

## 2022-07-03 DIAGNOSIS — Z510 Encounter for antineoplastic radiation therapy: Secondary | ICD-10-CM | POA: Diagnosis not present

## 2022-07-03 LAB — RAD ONC ARIA SESSION SUMMARY
Course Elapsed Days: 38
Plan Fractions Treated to Date: 28
Plan Fractions Treated to Date: 28
Plan Prescribed Dose Per Fraction: 1.8 Gy
Plan Prescribed Dose Per Fraction: 1.8 Gy
Plan Total Fractions Prescribed: 28
Plan Total Fractions Prescribed: 28
Plan Total Prescribed Dose: 50.4 Gy
Plan Total Prescribed Dose: 50.4 Gy
Reference Point Dosage Given to Date: 50.4 Gy
Reference Point Dosage Given to Date: 50.4 Gy
Reference Point Session Dosage Given: 1.8 Gy
Reference Point Session Dosage Given: 1.8 Gy
Session Number: 28

## 2022-07-06 ENCOUNTER — Other Ambulatory Visit: Payer: Self-pay

## 2022-07-06 ENCOUNTER — Ambulatory Visit
Admission: RE | Admit: 2022-07-06 | Discharge: 2022-07-06 | Disposition: A | Discharge status: Home / Self Care | Payer: 59 | Source: Ambulatory Visit | Attending: Radiation Oncology | Admitting: Radiation Oncology

## 2022-07-06 ENCOUNTER — Ambulatory Visit: Payer: 59

## 2022-07-06 ENCOUNTER — Inpatient Hospital Stay: Payer: 59 | Attending: Hematology and Oncology | Admitting: Hematology and Oncology

## 2022-07-06 DIAGNOSIS — Z170 Estrogen receptor positive status [ER+]: Secondary | ICD-10-CM | POA: Diagnosis not present

## 2022-07-06 DIAGNOSIS — D0511 Intraductal carcinoma in situ of right breast: Secondary | ICD-10-CM | POA: Insufficient documentation

## 2022-07-06 DIAGNOSIS — C50412 Malignant neoplasm of upper-outer quadrant of left female breast: Secondary | ICD-10-CM | POA: Diagnosis present

## 2022-07-06 DIAGNOSIS — L598 Other specified disorders of the skin and subcutaneous tissue related to radiation: Secondary | ICD-10-CM | POA: Insufficient documentation

## 2022-07-06 DIAGNOSIS — Z79899 Other long term (current) drug therapy: Secondary | ICD-10-CM | POA: Diagnosis not present

## 2022-07-06 DIAGNOSIS — Z510 Encounter for antineoplastic radiation therapy: Secondary | ICD-10-CM | POA: Diagnosis not present

## 2022-07-06 DIAGNOSIS — Z79621 Long term (current) use of calcineurin inhibitor: Secondary | ICD-10-CM | POA: Insufficient documentation

## 2022-07-06 LAB — RAD ONC ARIA SESSION SUMMARY
Course Elapsed Days: 41
Plan Fractions Treated to Date: 1
Plan Fractions Treated to Date: 1
Plan Prescribed Dose Per Fraction: 2 Gy
Plan Prescribed Dose Per Fraction: 2 Gy
Plan Total Fractions Prescribed: 6
Plan Total Fractions Prescribed: 6
Plan Total Prescribed Dose: 12 Gy
Plan Total Prescribed Dose: 12 Gy
Reference Point Dosage Given to Date: 2 Gy
Reference Point Dosage Given to Date: 2 Gy
Reference Point Session Dosage Given: 2 Gy
Reference Point Session Dosage Given: 2 Gy
Session Number: 29

## 2022-07-06 MED ORDER — TAMOXIFEN CITRATE 20 MG PO TABS: 20.0000 mg | ORAL_TABLET | Freq: Every day | ORAL | 3 refills | Status: DC

## 2022-07-06 NOTE — Assessment & Plan Note (Addendum)
Bilateral lumpectomies 03/04/2022 Right lumpectomy: Intermediate grade to high-grade DCIS with necrosis and calcifications, right medial margin: Intermediate grade DCIS, right superior margin: Intermediate grade DCIS, final margins negative, right additional inferior margin: DCIS. Left lumpectomy: Grade 1 IDC 1.6 cm with DCIS margins negative; Left second lumpectomy: Grade 2 IDC 1 cm, DCIS, DCIS focally involves lateral margin, 0/3 lymph nodes negative   Treatment plan: 1.  Resection of the left lateral margin 2. Oncotype DX score: 5 (ROR 3%) 3. Bilateral radiation therapies: 05/27/2022-07/13/2022 4.  Antiestrogen therapy to start 07/19/2022    Tamoxifen counseling: We discussed the risks and benefits of tamoxifen. These include but not limited to insomnia, hot flashes, mood changes, vaginal dryness, and weight gain. Although rare, serious side effects including endometrial cancer, risk of blood clots were also discussed. We strongly believe that the benefits far outweigh the risks. Patient understands these risks and consented to starting treatment. Planned treatment duration is 5-10 years.  Return to clinic in 3 months for survivorship care plan visit

## 2022-07-07 ENCOUNTER — Other Ambulatory Visit: Payer: Self-pay

## 2022-07-07 ENCOUNTER — Ambulatory Visit
Admission: RE | Admit: 2022-07-07 | Discharge: 2022-07-07 | Disposition: A | Discharge status: Home / Self Care | Payer: 59 | Source: Ambulatory Visit | Attending: Radiation Oncology | Admitting: Radiation Oncology

## 2022-07-07 ENCOUNTER — Ambulatory Visit: Payer: 59

## 2022-07-07 DIAGNOSIS — D0511 Intraductal carcinoma in situ of right breast: Secondary | ICD-10-CM

## 2022-07-07 DIAGNOSIS — Z510 Encounter for antineoplastic radiation therapy: Secondary | ICD-10-CM | POA: Diagnosis not present

## 2022-07-07 LAB — RAD ONC ARIA SESSION SUMMARY
Course Elapsed Days: 42
Plan Fractions Treated to Date: 2
Plan Fractions Treated to Date: 2
Plan Prescribed Dose Per Fraction: 2 Gy
Plan Prescribed Dose Per Fraction: 2 Gy
Plan Total Fractions Prescribed: 6
Plan Total Fractions Prescribed: 6
Plan Total Prescribed Dose: 12 Gy
Plan Total Prescribed Dose: 12 Gy
Reference Point Dosage Given to Date: 4 Gy
Reference Point Dosage Given to Date: 4 Gy
Reference Point Session Dosage Given: 2 Gy
Reference Point Session Dosage Given: 2 Gy
Session Number: 30

## 2022-07-07 MED ORDER — RADIAPLEXRX EX GEL: Freq: Once | CUTANEOUS | Status: AC

## 2022-07-08 ENCOUNTER — Other Ambulatory Visit: Payer: Self-pay

## 2022-07-08 ENCOUNTER — Ambulatory Visit: Payer: 59

## 2022-07-08 ENCOUNTER — Ambulatory Visit
Admission: RE | Admit: 2022-07-08 | Discharge: 2022-07-08 | Disposition: A | Discharge status: Home / Self Care | Payer: 59 | Source: Ambulatory Visit | Attending: Radiation Oncology | Admitting: Radiation Oncology

## 2022-07-08 DIAGNOSIS — Z510 Encounter for antineoplastic radiation therapy: Secondary | ICD-10-CM | POA: Diagnosis not present

## 2022-07-08 LAB — RAD ONC ARIA SESSION SUMMARY
Course Elapsed Days: 43
Plan Fractions Treated to Date: 3
Plan Fractions Treated to Date: 3
Plan Prescribed Dose Per Fraction: 2 Gy
Plan Prescribed Dose Per Fraction: 2 Gy
Plan Total Fractions Prescribed: 6
Plan Total Fractions Prescribed: 6
Plan Total Prescribed Dose: 12 Gy
Plan Total Prescribed Dose: 12 Gy
Reference Point Dosage Given to Date: 6 Gy
Reference Point Dosage Given to Date: 6 Gy
Reference Point Session Dosage Given: 2 Gy
Reference Point Session Dosage Given: 2 Gy
Session Number: 31

## 2022-07-09 ENCOUNTER — Ambulatory Visit
Admission: RE | Admit: 2022-07-09 | Discharge: 2022-07-09 | Disposition: A | Discharge status: Home / Self Care | Payer: 59 | Source: Ambulatory Visit | Attending: Radiation Oncology | Admitting: Radiation Oncology

## 2022-07-09 ENCOUNTER — Other Ambulatory Visit: Payer: Self-pay

## 2022-07-09 ENCOUNTER — Encounter: Payer: Self-pay | Admitting: *Deleted

## 2022-07-09 DIAGNOSIS — Z170 Estrogen receptor positive status [ER+]: Secondary | ICD-10-CM

## 2022-07-09 DIAGNOSIS — Z510 Encounter for antineoplastic radiation therapy: Secondary | ICD-10-CM | POA: Diagnosis not present

## 2022-07-09 LAB — RAD ONC ARIA SESSION SUMMARY
Course Elapsed Days: 44
Plan Fractions Treated to Date: 4
Plan Fractions Treated to Date: 4
Plan Prescribed Dose Per Fraction: 2 Gy
Plan Prescribed Dose Per Fraction: 2 Gy
Plan Total Fractions Prescribed: 6
Plan Total Fractions Prescribed: 6
Plan Total Prescribed Dose: 12 Gy
Plan Total Prescribed Dose: 12 Gy
Reference Point Dosage Given to Date: 8 Gy
Reference Point Dosage Given to Date: 8 Gy
Reference Point Session Dosage Given: 2 Gy
Reference Point Session Dosage Given: 2 Gy
Session Number: 32

## 2022-07-10 ENCOUNTER — Ambulatory Visit: Payer: 59

## 2022-07-10 ENCOUNTER — Other Ambulatory Visit: Payer: Self-pay

## 2022-07-10 ENCOUNTER — Ambulatory Visit
Admission: RE | Admit: 2022-07-10 | Discharge: 2022-07-10 | Disposition: A | Discharge status: Home / Self Care | Payer: 59 | Source: Ambulatory Visit | Attending: Radiation Oncology | Admitting: Radiation Oncology

## 2022-07-10 DIAGNOSIS — Z510 Encounter for antineoplastic radiation therapy: Secondary | ICD-10-CM | POA: Diagnosis not present

## 2022-07-10 LAB — RAD ONC ARIA SESSION SUMMARY
Course Elapsed Days: 45
Plan Fractions Treated to Date: 5
Plan Fractions Treated to Date: 5
Plan Prescribed Dose Per Fraction: 2 Gy
Plan Prescribed Dose Per Fraction: 2 Gy
Plan Total Fractions Prescribed: 6
Plan Total Fractions Prescribed: 6
Plan Total Prescribed Dose: 12 Gy
Plan Total Prescribed Dose: 12 Gy
Reference Point Dosage Given to Date: 10 Gy
Reference Point Dosage Given to Date: 10 Gy
Reference Point Session Dosage Given: 2 Gy
Reference Point Session Dosage Given: 2 Gy
Session Number: 33

## 2022-07-13 ENCOUNTER — Other Ambulatory Visit: Payer: Self-pay

## 2022-07-13 ENCOUNTER — Ambulatory Visit
Admission: RE | Admit: 2022-07-13 | Discharge: 2022-07-13 | Disposition: A | Discharge status: Home / Self Care | Payer: 59 | Source: Ambulatory Visit | Attending: Radiation Oncology | Admitting: Radiation Oncology

## 2022-07-13 ENCOUNTER — Encounter: Payer: Self-pay | Admitting: Radiation Oncology

## 2022-07-13 DIAGNOSIS — Z510 Encounter for antineoplastic radiation therapy: Secondary | ICD-10-CM | POA: Diagnosis not present

## 2022-07-13 LAB — RAD ONC ARIA SESSION SUMMARY
Course Elapsed Days: 48
Plan Fractions Treated to Date: 6
Plan Fractions Treated to Date: 6
Plan Prescribed Dose Per Fraction: 2 Gy
Plan Prescribed Dose Per Fraction: 2 Gy
Plan Total Fractions Prescribed: 6
Plan Total Fractions Prescribed: 6
Plan Total Prescribed Dose: 12 Gy
Plan Total Prescribed Dose: 12 Gy
Reference Point Dosage Given to Date: 12 Gy
Reference Point Dosage Given to Date: 12 Gy
Reference Point Session Dosage Given: 2 Gy
Reference Point Session Dosage Given: 2 Gy
Session Number: 34

## 2022-07-23 ENCOUNTER — Telehealth: Payer: Self-pay

## 2022-07-23 NOTE — Telephone Encounter (Signed)
Received a call from patient stating the she continues to have severe fatigue after completing radiation treatment on 07/13/22 to bilateral breast. Patient is requesting that we extend her medical leave until 08/05/22. Patient also states she wants to give her body time to adjust to starting Tamaxofin and Prograf on 07/27/22. Patient request that letter be faxed to [phone].

## 2022-08-03 ENCOUNTER — Telehealth: Payer: Self-pay | Admitting: *Deleted

## 2022-08-03 NOTE — Telephone Encounter (Signed)
"  Raven Walker, [place] [phone]) regarding status of [name] form.  Dr. Sondra Come extended her leave.  Paperwork due today per employer."  Advised twenty forms queued ahead of this request.  Pulled form for XRT to complete.  Advised medical records also requested.    Returned call to [employer] [phone]) as requested per voicemail.  Advised [name] of form status.  Reports she just spoke with [place].  Denies further questions or needs.

## 2022-08-10 ENCOUNTER — Encounter: Payer: Self-pay | Admitting: Radiation Oncology

## 2022-08-13 ENCOUNTER — Encounter: Payer: Self-pay | Admitting: Radiation Oncology

## 2022-08-13 ENCOUNTER — Ambulatory Visit
Admission: RE | Admit: 2022-08-13 | Discharge: 2022-08-13 | Disposition: A | Discharge status: Home / Self Care | Payer: 59 | Source: Ambulatory Visit | Attending: Radiation Oncology | Admitting: Radiation Oncology

## 2022-08-13 ENCOUNTER — Telehealth: Payer: Self-pay | Admitting: Oncology

## 2022-08-13 ENCOUNTER — Other Ambulatory Visit: Payer: Self-pay

## 2022-08-13 VITALS — BP 127/90 | HR 74 | Temp 97.5°F | Resp 20 | Ht 69.0 in | Wt 230.8 lb

## 2022-08-13 DIAGNOSIS — D0511 Intraductal carcinoma in situ of right breast: Secondary | ICD-10-CM

## 2022-08-13 DIAGNOSIS — Z170 Estrogen receptor positive status [ER+]: Secondary | ICD-10-CM

## 2022-08-13 HISTORY — DX: Personal history of irradiation: Z92.3

## 2022-08-13 NOTE — Progress Notes (Incomplete)
  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  Cancer Staging: S/p bilateral lumpectomies:    Stage 0 (cTis (DCIS), cN0, cM0) Right Breast, Intermediate to high-grade ductal carcinoma in-situ, ER+ / PR+ / Her2 not assessed   Stage IA (cT1c, cN0) Left Breast UOQ, 2 foci of invasive ductal carcinoma with intermediate grade DCIS measuring, ER+ / PR+ / Her2-, Grade 2  Intent: Curative  Radiation Treatment Dates: 05/26/2022 through 07/13/2022 Site Technique Total Dose (Gy) Dose per Fx (Gy) Completed Fx Beam Energies  Breast, Right: Breast_R 3D 50.4/50.4 1.8 28/28 10X  Breast, Right: Breast_R_Bst specialPort 12/12 2 6/6 12E, 15E  Breast, Left: Breast_L 3D 50.4/50.4 1.8 28/28 10X  Breast, Left: Breast_L_Bst 3D 12/12 2 6/6 6X, 10X   Narrative: The patient tolerated radiation therapy relatively well. On the date of her final treatment, the patient reported episodes of sharp pains at night, fatigue, and itching and peeling. Physical exam performed showed significant hyperpigmentation changes to the left and right breast area.  No skin breakdown or signs of infection were appreciated.  Plan: The patient will follow-up with radiation oncology in one month .  ________________________________________________ -----------------------------------  Blair Promise, PhD, MD  This document serves as a record of services personally performed by Gery Pray, MD. It was created on his behalf by Roney Mans, a trained medical scribe. The creation of this record is  based on the scribe's personal observations and the provider's statements to them. This document has been checked and approved by the attending provider.

## 2022-08-13 NOTE — Telephone Encounter (Signed)
Left a message for Outpatient Rehab at [facility] to move up patient's PT appointment due to edema in her left arm/hand.

## 2022-08-13 NOTE — Progress Notes (Signed)
Raven Walker is here today for follow up post radiation to the breast.   Breast Side:Left, Right breast.    They completed their radiation on: 07/13/22  Does the patient complain of any of the following: Post radiation skin issues:  Continues to have itching mostly to left breast. Using Vitamin E oil and Radiaplex.  Breast Tenderness: No Breast Swelling: No Lymphadema: Patient reports increased swelling to left hand, patient states she noticed swelling after starting tamoxifen.  Range of Motion limitations: No Fatigue post radiation: Improving  Appetite good/fair/poor: Good   Additional comments if applicable:   BP (!) 094/07 (BP Location: Right Arm, Patient Position: Sitting, Cuff Size: Large)   Pulse 74   Temp (!) 97.5 F (36.4 C)   Resp 20   Ht $R'5\' 9"'ai$  (1.753 m)   Wt 230 lb 12.8 oz (104.7 kg)   SpO2 99%   BMI 34.08 kg/m

## 2022-08-13 NOTE — Progress Notes (Addendum)
Radiation Oncology         [phone] ________________________________  Name: Raven Walker MRN: [id number]  Date: 08/13/2022  DOB: 11/22/1980  Follow-Up Visit Note  CC: Celene Squibb, MD  Celene Squibb, MD    ICD-10-CM   1. Malignant neoplasm of upper-outer quadrant of left breast in female, estrogen receptor positive ([place])  C50.412 AMB referral to rehabilitation   Z17.0 Ambulatory referral to Physical Therapy    2. Ductal carcinoma in situ (DCIS) of right breast  D05.11       Diagnosis:  S/p bilateral lumpectomies and radiation:    Stage 0 (cTis (DCIS), cN0, cM0) Right Breast, Intermediate to high-grade ductal carcinoma in-situ, ER+ / PR+ / Her2 not assessed   Stage IA (cT1c, cN0) Left Breast UOQ, 2 foci of invasive ductal carcinoma with intermediate grade DCIS measuring, ER+ / PR+ / Her2-, Grade 2  Interval Since Last Radiation: 1 month   Intent: Curative  Radiation Treatment Dates: 05/26/2022 through 07/13/2022 Site Technique Total Dose (Gy) Dose per Fx (Gy) Completed Fx Beam Energies  Breast, Right: Breast_R 3D 50.4/50.4 1.8 28/28 10X  Breast, Right: Breast_R_Bst specialPort 12/12 2 6/6 12E, 15E  Breast, Left: Breast_L 3D 50.4/50.4 1.8 28/28 10X  Breast, Left: Breast_L_Bst 3D 12/12 2 6/6 6X, 10X   Narrative:  The patient returns today for routine follow-up. The patient tolerated radiation therapy relatively well. On the date of her final treatment, the patient reported episodes of sharp pains at night, fatigue, and itching and peeling. Physical exam performed showed significant hyperpigmentation changes to the left and right breast area.  No skin breakdown or signs of infection were appreciated.       During her most recent follow-up visit with Dr. Lindi Adie on 07/06/22, she agreed to proceed with antiestrogen therapy consisting of tamoxifen. She was also noted to report some itching and fatigue from radiation. She denied any other concerns.   Patient has been  experiencing some swelling in her left hand and forearm. She states this started on Sunday and has been present since then. She does not recall any recent cuts or insect bites to this region. She is scheduled to see physical therapy at the end of this month. She denies painhjk  along her arm or in her left axilla region.  She is otherwise doing fine today. She denies any chest pain or shortness of breath.    Allergies:  is allergic to nickel.  Meds: Current Outpatient Medications  Medication Sig Dispense Refill   acetaminophen (TYLENOL) 500 MG tablet Take 500-1,000 mg by mouth every 6 (six) hours as needed for moderate pain.     calcitRIOL (ROCALTROL) 0.25 MCG capsule Take 0.5 mcg by mouth daily.     calcium carbonate (TUMS - DOSED IN MG ELEMENTAL CALCIUM) 500 MG chewable tablet Chew 1 tablet by mouth 2 (two) times daily.     diltiazem (CARDIZEM) 30 MG tablet Take 30 mg by mouth 2 (two) times daily.     furosemide (LASIX) 40 MG tablet Take 40 mg by mouth 2 (two) times daily as needed for edema.     Iron-FA-B Cmp-C-Biot-Probiotic (FUSION PLUS) CAPS Take 1 capsule by mouth daily.     magnesium oxide (MAG-OX) 400 (240 Mg) MG tablet Take 1 tablet by mouth 2 (two) times daily.     metoprolol tartrate (LOPRESSOR) 50 MG tablet Take 50 mg by mouth 2 (two) times daily.     Multiple Vitamins-Minerals (HAIR/SKIN/NAILS) TABS Take 2 tablets by mouth  daily.     mycophenolate (CELLCEPT) 250 MG capsule Take 750 mg by mouth 2 (two) times daily.      pantoprazole (PROTONIX) 40 MG tablet Take 40 mg by mouth daily as needed (acid reflux).     tacrolimus (PROGRAF) 1 MG capsule Take 3 capsules (3 mg total) by mouth 2 (two) times daily.     tamoxifen (NOLVADEX) 20 MG tablet Take 1 tablet (20 mg total) by mouth daily. 90 tablet 3   No current facility-administered medications for this encounter.    Physical Findings: The patient is in no acute distress. Patient is alert and oriented.  height is _0  (1.753 m)  and weight is 230 lb 12.8 oz (104.7 kg). Her temperature is 97.5 F (36.4 C) (abnormal). Her blood pressure is 127/90 (abnormal) and her pulse is 74. Her respiration is 20 and oxygen saturation is 99%. .  No significant changes. Lungs are clear to auscultation bilaterally. Heart has regular rate and rhythm. No palpable cervical, supraclavicular, or axillary adenopathy. Abdomen soft, non-tender, normal bowel sounds.   Left hand and forearm are notable for moderate swelling. No tenderness to palpation. Skin intact and consistent in color. No swelling noted in the upper arm.  No palpable cord in the left upper arm.  Left Breast: Hyperpigmentation in areas of radiation exposure consistent with effects of radiation. No concerning masses palpated in the left chest wall or left axilla region.  Right Breast: Healing well from radiation exposure. Slight hyperpigmentation in area of radiation exposure. No concerning skin changes. No concerning masses palpated in the right chest wall or right axilla region.   Lab Findings: Lab Results  Component Value Date   WBC 5.6 02/20/2022   HGB 10.7 (L) 02/20/2022   HCT 32.8 (L) 02/20/2022   MCV 82.6 02/20/2022   PLT 227 02/20/2022    Radiographic Findings: No results found.  Impression: S/p bilateral lumpectomies and radiation:    Stage 0 (cTis (DCIS), cN0, cM0) Right Breast, Intermediate to high-grade ductal carcinoma in-situ, ER+ / PR+ / Her2 not assessed   Stage IA (cT1c, cN0) Left Breast UOQ, 2 foci of invasive ductal carcinoma with intermediate grade DCIS measuring, ER+ / PR+ / Her2-, Grade 2  The patient is recovering from the effects of radiation. She does have a 5 day history of mild swelling in her left forearm and hand that needs to be evaluated for lymphedema.   Plan:    Patient is scheduled for appointment with physical therapy on 11/27. We will try to get her seen sooner to assess patient's arm swelling.   Routine follow-up in 3 months. No  signs of disease recurrence on clinical exam today. Patient can call the office back at any time with questions or concerns and we will be happy to assist.   ____________________________________   Leona Singleton, PA   Blair Promise, PhD, MD  This document serves as a record of services personally performed by Gery Pray, MD. It was created on his behalf by Roney Mans, a trained medical scribe. The creation of this record is based on the scribe's personal observations and the provider's statements to them. This document has been checked and approved by the attending provider.

## 2022-08-14 NOTE — Addendum Note (Signed)
Encounter addended by: Jacqulyn Liner, RN on: 08/14/2022 7:17 AM  Actions taken: Order list changed

## 2022-08-17 NOTE — Therapy (Unsigned)
OUTPATIENT PHYSICAL THERAPY ONCOLOGY EVALUATION  Patient Name: Raven Walker MRN: [id number] DOB:1981/01/09, 41 y.o., female Today's Date: 08/19/2022   PT End of Session - 08/19/22 1723     Visit Number 1    Number of Visits 10    Date for PT Re-Evaluation 09/30/22    PT Start Time 0301    PT Stop Time 3143    PT Time Calculation (min) 50 min    Activity Tolerance Patient tolerated treatment well    Behavior During Therapy [facility] for tasks assessed/performed             Past Medical History:  Diagnosis Date   Anemia    history   BV (bacterial vaginosis) 06/04/2014   ESRD on peritoneal dialysis ([place]) 10/05/2006   pt had kidney transplant 06/2012 and is no longer on dialysis   GERD (gastroesophageal reflux disease) 09/04/2009   ulcerative esophagitis per EGD    H/O kidney transplant 06/19/2012   Hematuria 07/02/2014   History of radiation therapy    Right Breast, left Breast- 05/26/22-07/13/22-Dr. Gery Pray   Hypertension    Poor appetite    History   Secondary hyperparathyroidism ([place])    s/p parathyroidectomy with autotransplantation to left arm per Dr. Baker Pierini on 08/04/11   Streptococcal peritonitis ([place]) 06/06/2011   SVD (spontaneous vaginal delivery)    x 1   Urinary frequency 07/02/2014   UTI (lower urinary tract infection) 07/02/2014   Vaginal discharge 06/04/2014   Past Surgical History:  Procedure Laterality Date   arm graphs  2008   three - done at [facility]; still in but never used because pt got a transplant.   AXILLARY SENTINEL NODE BIOPSY Left 03/04/2022   Procedure: LEFT AXILLARY SENTINEL NODE BIOPSY;  Surgeon: Rolm Bookbinder, MD;  Location: [place];  Service: General;  Laterality: Left;   BREAST LUMPECTOMY WITH RADIOACTIVE SEED LOCALIZATION Bilateral 03/04/2022   Procedure: BILATERAL BRACKETED BREAST LUMPECTOMY WITH RADIOACTIVE SEED LOCALIZATION;  Surgeon: Rolm Bookbinder, MD;  Location: [place];  Service: General;  Laterality:  Bilateral;   DILATATION & CURETTAGE/HYSTEROSCOPY WITH TRUECLEAR N/A 04/16/2014   Procedure: DILATATION & CURETTAGE/HYSTEROSCOPY WITH TRUCLEAR AND RESECTION OF FIBROID/THERMACHOICE ABLATION;  Surgeon: Jonnie Kind, MD;  Location: [facility];  Service: Gynecology;  Laterality: N/A;   KIDNEY TRANSPLANT Right 2013   [place]  08/05/11   with graft of parathyroid into left upper arm.   RE-EXCISION OF BREAST LUMPECTOMY Left 04/14/2022   Procedure: LEFT BREAST RE-EXCISION LUMPECTOMY;  Surgeon: Rolm Bookbinder, MD;  Location: [place];  Service: General;  Laterality: Left;   [place] EXTRACTION     Patient Active Problem List   Diagnosis Date Noted   Malignant neoplasm of upper-outer quadrant of left breast in female, estrogen receptor positive ([place]) 02/09/2022   Ductal carcinoma in situ (DCIS) of right breast 02/09/2022   Fibroids 01/06/2018   Abnormal uterine bleeding (AUB) 08/15/2015   Menorrhagia with regular cycle 03/14/2015   Urinary frequency 07/02/2014   Hematuria 07/02/2014   UTI (lower urinary tract infection) 07/02/2014   Vaginal discharge 06/04/2014   BV (bacterial vaginosis) 06/04/2014   Post-operative state 05/02/2014   Submucous uterine fibroid with menorrhagia 02/05/2014   Sepsis ([place]) 11/20/2013   UTI (urinary tract infection) 11/20/2013   Myalgia 11/20/2013   Acute on chronic renal failure ([place]) 11/20/2013   Leukocytosis, unspecified 11/20/2013   Menorrhagia, premenopausal 11/15/2013   Fever 08/30/2013   Acute pyelonephritis 06/29/2013  History of renal transplant 06/29/2013   Other ureteric obstruction 08/11/2012   Lymphocele 07/15/2012   Edema of both legs 07/04/2012   Immunosuppression ([place]) 06/28/2012   Hypocalcemia syndrome 08/19/2011    Class: Acute   Renal failure (ARF), acute on chronic ([place]) 08/19/2011    Class: Acute   HTN (hypertension) 08/19/2011    Class: Acute   GERD (gastroesophageal reflux disease)  08/19/2011    Class: Chronic   Hyperparathyroidism, secondary ([place]) 05/19/2011    PCP: Celene Squibb, MD  REFERRING PROVIDER: Gery Pray, MD  REFERRING DIAG: [phone] (ICD-10-CM) - Malignant neoplasm of upper-outer quadrant of left breast in female, estrogen receptor positive ([place])   THERAPY DIAG:  Lymphedema, not elsewhere classified  Abnormal posture  Malignant neoplasm of upper-outer quadrant of left breast in female, estrogen receptor positive ([place])  Malignant neoplasm of upper-outer quadrant of right breast in female, estrogen receptor positive ([place])  ONSET DATE: 08/09/22  Rationale for Evaluation and Treatment: Rehabilitation  SUBJECTIVE:                                                                                                                                                                                           SUBJECTIVE STATEMENT: I started taking the medicine on Oct 23 for 5 years. My left hand started swelling after about 1-2 weeks after that. I was wondering if it was a side effect. 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 I just returned to work Nov 1st and I have been out since May 30.  PERTINENT HISTORY:  Patient was diagnosed on 01/26/2022 with bilateral  breast Cancer with Right  gr. 2 DCIS ER, PR + and left Invasive ductal carcinoma. It measures .6 cm and is located in the upper outer quadrant. It is ER, PR +, Her 2 - with a Ki67 of 1%. She is s/p bilateral breast lumpectomies and left SLNB on 03/04/2022.  She is scheduled for a left breast re-excision on 04/14/2022. She is also to have radiation and anti estrogen. She is s/p a kidney transplant in 2013 and is doing very well PAIN:  Are you having pain? No  PRECAUTIONS: Kidney transplant 2013 , grafts bilateral arms from prior peritoneal dialysis   WEIGHT BEARING RESTRICTIONS: No  FALLS:  Has patient fallen in last 6 months? No  LIVING ENVIRONMENT: Lives with: lives with their spouse and  lives with their son Lives in: House/apartment Stairs: Yes; Internal: 15 steps; on right going up Has following equipment at home: None  OCCUPATION: works at [employer], standing all day, usually 5-6 #   LEISURE:  shoulder ROM exercises  HAND DOMINANCE: right   PRIOR LEVEL OF FUNCTION: Independent  PATIENT GOALS: to get back to normal ASAP   OBJECTIVE:  COGNITION: Overall cognitive status: Within functional limits for tasks assessed   PALPATION: Fullness palpable at dorsum of hand  OBSERVATIONS / OTHER ASSESSMENTS: visible edema as dorsum of L hand  POSTURE: forward head, rounded shoulders  UPPER EXTREMITY AROM/PROM: WFL    LYMPHEDEMA ASSESSMENTS:   SURGERY TYPE/DATE: bilateral breast mastectomy and L SLNB on 03/04/22  NUMBER OF LYMPH NODES REMOVED: 0/3  CHEMOTHERAPY: none  RADIATION:completed 07/13/22  HORMONE TREATMENT: currently on Tamoxifen  INFECTIONS: none  LYMPHEDEMA ASSESSMENTS:   LANDMARK RIGHT  eval  10 cm proximal to olecranon process 39  Olecranon process 28.6  10 cm proximal to ulnar styloid process 23  Just proximal to ulnar styloid process 17.8  Across hand at thumb web space 21  At base of 2nd digit 6.7  (Blank rows = not tested)  LANDMARK LEFT  eval  10 cm proximal to olecranon process 40  Olecranon process 29  10 cm proximal to ulnar styloid process 24.5  Just proximal to ulnar styloid process 18.6  Across hand at thumb web space 22.1  At base of 2nd digit 7.2  (Blank rows = not tested)  L-DEX LYMPHEDEMA SCREENING:  The patient was assessed using the L-Dex machine today to produce a lymphedema index baseline score. The patient will be reassessed on a regular basis (typically every 3 months) to obtain new L-Dex scores. If the score is > 6.5 points away from his/her baseline score indicating onset of subclinical lymphedema, it will be recommended to wear a compression garment for 4 weeks, 12 hours per day and then be  reassessed. If the score continues to be > 6.5 points from baseline at reassessment, we will initiate lymphedema treatment. Assessing in this manner has a 95% rate of preventing clinically significant lymphedema.   L-DEX FLOWSHEETS - 08/19/22 1600       L-DEX LYMPHEDEMA SCREENING   Measurement Type Unilateral    L-DEX MEASUREMENT EXTREMITY Upper Extremity    POSITION  Standing    DOMINANT SIDE Right    At Risk Side Left    BASELINE SCORE (UNILATERAL) 2.4    L-DEX SCORE (UNILATERAL) 16.6    VALUE CHANGE (UNILAT) 14.2             L-DEX FLOWSHEETS - 08/19/22 1600       L-DEX LYMPHEDEMA SCREENING   Measurement Type Unilateral    L-DEX MEASUREMENT EXTREMITY Upper Extremity    POSITION  Standing    DOMINANT SIDE Right    At Risk Side Left    BASELINE SCORE (UNILATERAL) 2.4    L-DEX SCORE (UNILATERAL) 16.6    VALUE CHANGE (UNILAT) 14.2               TODAY'S TREATMENT:  DATE:  08/19/22- Educated pt about lymphedema and how to treat it including MLD and compression garments. Measured pt for compression sleeve and glove but she did not fit in to an off the shelf one and will require a custom.   PATIENT EDUCATION:  Education details: lymphedema and how to treat it, day time compression garment vs bandaging and need for custom sleeve due to not fitting in to an off the shelf Person educated: Patient Education method: Explanation Education comprehension: verbalized understanding  HOME EXERCISE PROGRAM: NA  ASSESSMENT:  CLINICAL IMPRESSION: Patient is a 41 y.o. female who was seen today for physical therapy evaluation and treatment for L UE lymphedema. Pt recently returned to work and developed hand swelling about a week ago. Her SOZO score was in the red demonstrating she has developed lymphedema. Pt does not fit in to an off the shelf  sleeve and glove and due to her work schedule is unable to come 3x/wk for bandaging. She will come to be bandaged and then 2 days later will be measured for a compression sleeve and glove. Pt would benefit from skilled PT services to be instructed in self MLD and possibly bandaging and to obtain a compression garment.    OBJECTIVE IMPAIRMENTS: decreased knowledge of condition and increased edema.   ACTIVITY LIMITATIONS:  none  PARTICIPATION LIMITATIONS:  none  PERSONAL FACTORS:  none  are also affecting patient's functional outcome.   REHAB POTENTIAL: Good  CLINICAL DECISION MAKING: Stable/uncomplicated  EVALUATION COMPLEXITY: Low  GOALS: Goals reviewed with patient? Yes  SHORT TERM GOALS=LONG TERM GOALS Target date: 09/16/2022    Pt will be independent in self MLD for long term management of lymphedema.  Baseline: Goal status: INITIAL  2.  Pt will obtain appropriate compression garments for long term management of lymphedema.  Baseline:  Goal status: INITIAL  3.  Pt will demonstrate a 1cm decrease in edema at dorsum of hand.  Baseline:  Goal status: INITIAL  PLAN:  PT FREQUENCY: 1-2x/week  PT DURATION: 6 weeks  PLANNED INTERVENTIONS: Therapeutic exercises, Patient/Family education, Self Care, Orthotic/Fit training, Manual lymph drainage, Compression bandaging, Taping, and Vasopneumatic device  PLAN FOR NEXT SESSION: bandage LUE, next session measure for custom garments, instruct pt in self MLD and self bandaging   Manus Gunning, PT 08/19/2022, 5:25 PM

## 2022-08-19 ENCOUNTER — Other Ambulatory Visit: Payer: Self-pay

## 2022-08-19 ENCOUNTER — Ambulatory Visit: Payer: 59 | Attending: Radiation Oncology | Admitting: Physical Therapy

## 2022-08-19 ENCOUNTER — Encounter: Payer: Self-pay | Admitting: Physical Therapy

## 2022-08-19 DIAGNOSIS — I890 Lymphedema, not elsewhere classified: Secondary | ICD-10-CM | POA: Insufficient documentation

## 2022-08-19 DIAGNOSIS — Z170 Estrogen receptor positive status [ER+]: Secondary | ICD-10-CM | POA: Diagnosis present

## 2022-08-19 DIAGNOSIS — R293 Abnormal posture: Secondary | ICD-10-CM | POA: Insufficient documentation

## 2022-08-19 DIAGNOSIS — C50412 Malignant neoplasm of upper-outer quadrant of left female breast: Secondary | ICD-10-CM | POA: Insufficient documentation

## 2022-08-19 DIAGNOSIS — C50411 Malignant neoplasm of upper-outer quadrant of right female breast: Secondary | ICD-10-CM | POA: Diagnosis present

## 2022-08-31 ENCOUNTER — Ambulatory Visit: Payer: 59

## 2022-09-09 ENCOUNTER — Ambulatory Visit: Payer: 59 | Attending: Radiation Oncology | Admitting: Physical Therapy

## 2022-09-09 ENCOUNTER — Encounter: Payer: Self-pay | Admitting: Physical Therapy

## 2022-09-09 DIAGNOSIS — C50411 Malignant neoplasm of upper-outer quadrant of right female breast: Secondary | ICD-10-CM | POA: Diagnosis present

## 2022-09-09 DIAGNOSIS — R293 Abnormal posture: Secondary | ICD-10-CM | POA: Diagnosis present

## 2022-09-09 DIAGNOSIS — Z170 Estrogen receptor positive status [ER+]: Secondary | ICD-10-CM | POA: Diagnosis present

## 2022-09-09 DIAGNOSIS — C50412 Malignant neoplasm of upper-outer quadrant of left female breast: Secondary | ICD-10-CM | POA: Insufficient documentation

## 2022-09-09 DIAGNOSIS — I890 Lymphedema, not elsewhere classified: Secondary | ICD-10-CM

## 2022-09-09 NOTE — Therapy (Signed)
OUTPATIENT PHYSICAL THERAPY ONCOLOGY TREATMENT  Patient Name: Raven Walker MRN: [id number] DOB:1981/08/06, 41 y.o., female Today's Date: 09/09/2022   PT End of Session - 09/09/22 1602     Visit Number 2    Number of Visits 10    Date for PT Re-Evaluation 09/30/22    PT Start Time 1600    PT Stop Time 6546    PT Time Calculation (min) 48 min    Activity Tolerance Patient tolerated treatment well    Behavior During Therapy [facility] for tasks assessed/performed             Past Medical History:  Diagnosis Date   Anemia    history   BV (bacterial vaginosis) 06/04/2014   ESRD on peritoneal dialysis ([place]) 10/05/2006   pt had kidney transplant 06/2012 and is no longer on dialysis   GERD (gastroesophageal reflux disease) 09/04/2009   ulcerative esophagitis per EGD    H/O kidney transplant 06/19/2012   Hematuria 07/02/2014   History of radiation therapy    Right Breast, left Breast- 05/26/22-07/13/22-Dr. Gery Pray   Hypertension    Poor appetite    History   Secondary hyperparathyroidism ([place])    s/p parathyroidectomy with autotransplantation to left arm per Dr. Baker Pierini on 08/04/11   Streptococcal peritonitis ([place]) 06/06/2011   SVD (spontaneous vaginal delivery)    x 1   Urinary frequency 07/02/2014   UTI (lower urinary tract infection) 07/02/2014   Vaginal discharge 06/04/2014   Past Surgical History:  Procedure Laterality Date   arm graphs  2008   three - done at [facility]; still in but never used because pt got a transplant.   AXILLARY SENTINEL NODE BIOPSY Left 03/04/2022   Procedure: LEFT AXILLARY SENTINEL NODE BIOPSY;  Surgeon: Rolm Bookbinder, MD;  Location: [place];  Service: General;  Laterality: Left;   BREAST LUMPECTOMY WITH RADIOACTIVE SEED LOCALIZATION Bilateral 03/04/2022   Procedure: BILATERAL BRACKETED BREAST LUMPECTOMY WITH RADIOACTIVE SEED LOCALIZATION;  Surgeon: Rolm Bookbinder, MD;  Location: [place];  Service: General;  Laterality:  Bilateral;   DILATATION & CURETTAGE/HYSTEROSCOPY WITH TRUECLEAR N/A 04/16/2014   Procedure: DILATATION & CURETTAGE/HYSTEROSCOPY WITH TRUCLEAR AND RESECTION OF FIBROID/THERMACHOICE ABLATION;  Surgeon: Jonnie Kind, MD;  Location: [facility];  Service: Gynecology;  Laterality: N/A;   KIDNEY TRANSPLANT Right 2013   [place]  08/05/11   with graft of parathyroid into left upper arm.   RE-EXCISION OF BREAST LUMPECTOMY Left 04/14/2022   Procedure: LEFT BREAST RE-EXCISION LUMPECTOMY;  Surgeon: Rolm Bookbinder, MD;  Location: [place];  Service: General;  Laterality: Left;   [place] EXTRACTION     Patient Active Problem List   Diagnosis Date Noted   Malignant neoplasm of upper-outer quadrant of left breast in female, estrogen receptor positive ([place]) 02/09/2022   Ductal carcinoma in situ (DCIS) of right breast 02/09/2022   Fibroids 01/06/2018   Abnormal uterine bleeding (AUB) 08/15/2015   Menorrhagia with regular cycle 03/14/2015   Urinary frequency 07/02/2014   Hematuria 07/02/2014   UTI (lower urinary tract infection) 07/02/2014   Vaginal discharge 06/04/2014   BV (bacterial vaginosis) 06/04/2014   Post-operative state 05/02/2014   Submucous uterine fibroid with menorrhagia 02/05/2014   Sepsis ([place]) 11/20/2013   UTI (urinary tract infection) 11/20/2013   Myalgia 11/20/2013   Acute on chronic renal failure ([place]) 11/20/2013   Leukocytosis, unspecified 11/20/2013   Menorrhagia, premenopausal 11/15/2013   Fever 08/30/2013   Acute pyelonephritis 06/29/2013  History of renal transplant 06/29/2013   Other ureteric obstruction 08/11/2012   Lymphocele 07/15/2012   Edema of both legs 07/04/2012   Immunosuppression ([place]) 06/28/2012   Hypocalcemia syndrome 08/19/2011    Class: Acute   Renal failure (ARF), acute on chronic ([place]) 08/19/2011    Class: Acute   HTN (hypertension) 08/19/2011    Class: Acute   GERD (gastroesophageal reflux disease)  08/19/2011    Class: Chronic   Hyperparathyroidism, secondary ([place]) 05/19/2011    PCP: Celene Squibb, MD  REFERRING PROVIDER: Gery Pray, MD  REFERRING DIAG: [phone] (ICD-10-CM) - Malignant neoplasm of upper-outer quadrant of left breast in female, estrogen receptor positive ([place])   THERAPY DIAG:  Lymphedema, not elsewhere classified  Abnormal posture  Malignant neoplasm of upper-outer quadrant of left breast in female, estrogen receptor positive ([place])  Malignant neoplasm of upper-outer quadrant of right breast in female, estrogen receptor positive ([place])  ONSET DATE: 08/09/22  Rationale for Evaluation and Treatment: Rehabilitation  SUBJECTIVE:                                                                                                                                                                                           SUBJECTIVE STATEMENT: My hand went down over Thanksgiving when I had time away from work and it stayed down until I went back to work.  PERTINENT HISTORY:  Patient was diagnosed on 01/26/2022 with bilateral  breast Cancer with Right  gr. 2 DCIS ER, PR + and left Invasive ductal carcinoma. It measures .6 cm and is located in the upper outer quadrant. It is ER, PR +, Her 2 - with a Ki67 of 1%. She is s/p bilateral breast lumpectomies and left SLNB on 03/04/2022.  She is scheduled for a left breast re-excision on 04/14/2022. She is also to have radiation and anti estrogen. She is s/p a kidney transplant in 2013 and is doing very well PAIN:  Are you having pain? No  PRECAUTIONS: Kidney transplant 2013 , grafts bilateral arms from prior peritoneal dialysis   WEIGHT BEARING RESTRICTIONS: No  FALLS:  Has patient fallen in last 6 months? No  LIVING ENVIRONMENT: Lives with: lives with their spouse and lives with their son Lives in: House/apartment Stairs: Yes; Internal: 15 steps; on right going up Has following equipment at home: None  OCCUPATION: works at  [employer], standing all day, usually 5-6 #   LEISURE: shoulder ROM exercises  HAND DOMINANCE: right   PRIOR LEVEL OF FUNCTION: Independent  PATIENT GOALS: to get back to normal ASAP   OBJECTIVE:  COGNITION: Overall cognitive status: Within functional limits for  tasks assessed   PALPATION: Fullness palpable at dorsum of hand  OBSERVATIONS / OTHER ASSESSMENTS: visible edema as dorsum of L hand  POSTURE: forward head, rounded shoulders  UPPER EXTREMITY AROM/PROM: WFL    LYMPHEDEMA ASSESSMENTS:   SURGERY TYPE/DATE: bilateral breast mastectomy and L SLNB on 03/04/22  NUMBER OF LYMPH NODES REMOVED: 0/3  CHEMOTHERAPY: none  RADIATION:completed 07/13/22  HORMONE TREATMENT: currently on Tamoxifen  INFECTIONS: none  LYMPHEDEMA ASSESSMENTS:   LANDMARK RIGHT  eval  10 cm proximal to olecranon process 39  Olecranon process 28.6  10 cm proximal to ulnar styloid process 23  Just proximal to ulnar styloid process 17.8  Across hand at thumb web space 21  At base of 2nd digit 6.7  (Blank rows = not tested)  LANDMARK LEFT  eval  10 cm proximal to olecranon process 40  Olecranon process 29  10 cm proximal to ulnar styloid process 24.5  Just proximal to ulnar styloid process 18.6  Across hand at thumb web space 22.1  At base of 2nd digit 7.2  (Blank rows = not tested)  L-DEX LYMPHEDEMA SCREENING:  The patient was assessed using the L-Dex machine today to produce a lymphedema index baseline score. The patient will be reassessed on a regular basis (typically every 3 months) to obtain new L-Dex scores. If the score is > 6.5 points away from his/her baseline score indicating onset of subclinical lymphedema, it will be recommended to wear a compression garment for 4 weeks, 12 hours per day and then be reassessed. If the score continues to be > 6.5 points from baseline at reassessment, we will initiate lymphedema treatment. Assessing in this manner has a 95% rate  of preventing clinically significant lymphedema.   L-DEX FLOWSHEETS - 09/09/22 1600       L-DEX LYMPHEDEMA SCREENING   Measurement Type Unilateral    L-DEX MEASUREMENT EXTREMITY Upper Extremity    POSITION  Standing    DOMINANT SIDE Right    At Risk Side Left    BASELINE SCORE (UNILATERAL) 2.4    L-DEX SCORE (UNILATERAL) 10.5    VALUE CHANGE (UNILAT) 8.1              TODAY'S TREATMENT:                                                                                                                                         DATE:  09/09/22:  Educated pt in self MLD as follows: In sitting: Short neck, 5 diaphragmatic breaths, R axillary nodes and establishment of interaxillary pathway, L inguinal nodes and establishment of axilloinguinal pathway, then L UE working proximal to distal, moving fluid from upper inner arm outwards, and doing both sides of forearm moving fluid towards pathways spending extra time in any areas of fibrosis then retracing all steps with pt return demonstrating each step and able to demonstrate appropriate skin stretch and sequence  with min cueing Applied compression bandaging prior to pt being measured for garments on Friday as follows: TG soft from hand to axilla, elastomull to digits 1-5 covered with paper tape, artiflex from hand to axilla, 1 6cm bandage at hand, 1 8 cm bandage from wrist to axilla in herringbone pattern, 1 10 cm bandage from wrist to axilla in spiral and 1 12 cm bandage from wrist to axilla in spiral. Educated pt to remove bandages if she has discomfort, numbness or tingling that does not go away when she moves her arm or wrist Repeated SOZO and pt is now in the yellow - educated pt about subclinical lymphedema and how it can be reversed with wearing a compression sleeve 12 hrs a day for 4 weeks.  08/19/22- Educated pt about lymphedema and how to treat it including MLD and compression garments. Measured pt for compression sleeve and glove but she did  not fit in to an off the shelf one and will require a custom.   PATIENT EDUCATION:  Education details: subclinical lymphedema and need for compression sleeve Person educated: Patient Education method: Explanation Education comprehension: verbalized understanding  HOME EXERCISE PROGRAM: NA  ASSESSMENT:  CLINICAL IMPRESSION: Patient returns to PT and therapist instructed pt in self MLD technique and had pt return demonstrate all steps and issued this as part of HEP. Applied compression bandaging to maximally reduce L UE prior to measuring her for a custom compression sleeve and glove on Friday. She is now in the yellow zone with SOZO demonstrating subclinical lymphedema. She was in the red previously so her edema may be reversible.   OBJECTIVE IMPAIRMENTS: decreased knowledge of condition and increased edema.   ACTIVITY LIMITATIONS:  none  PARTICIPATION LIMITATIONS:  none  PERSONAL FACTORS:  none  are also affecting patient's functional outcome.   REHAB POTENTIAL: Good  CLINICAL DECISION MAKING: Stable/uncomplicated  EVALUATION COMPLEXITY: Low  GOALS: Goals reviewed with patient? Yes  SHORT TERM GOALS=LONG TERM GOALS Target date: 09/16/2022    Pt will be independent in self MLD for long term management of lymphedema.  Baseline: Goal status: INITIAL  2.  Pt will obtain appropriate compression garments for long term management of lymphedema.  Baseline:  Goal status: INITIAL  3.  Pt will demonstrate a 1cm decrease in edema at dorsum of hand.  Baseline:  Goal status: INITIAL  PLAN:  PT FREQUENCY: 1-2x/week  PT DURATION: 6 weeks  PLANNED INTERVENTIONS: Therapeutic exercises, Patient/Family education, Self Care, Orthotic/Fit training, Manual lymph drainage, Compression bandaging, Taping, and Vasopneumatic device  PLAN FOR NEXT SESSION: how were bandages? measure for custom garments daytime sleeve and glove - flat knit?, cont to instruct pt in self MLD and self bandaging  - insurance info sent to [facility] to check benefits on 09/09/22   [facility], PT 09/09/2022, 4:51 PM

## 2022-09-09 NOTE — Patient Instructions (Signed)
Deep Effective Breath   Standing, sitting, or laying down, place both hands on the belly. Take a deep breath IN, expanding the belly; then breath OUT, contracting the belly. Repeat __5__ times. Do __2-3__ sessions per day and before your self massage.  http://gt2.exer.us/866   Copyright  VHI. All rights reserved.  Axilla to Axilla - Sweep   On uninvolved side make 5 circles in the armpit, then pump _5__ times from involved armpit across chest to uninvolved armpit, making a pathway. Do _1__ time per day.  Copyright  VHI. All rights reserved.  Axilla to Inguinal Nodes - Sweep   On involved side, make 5 circles at groin at panty line, then pump (stretch skin)_5__ times from armpit along side of trunk to outer hip, making your other pathway. Do __1_ time per day.  Copyright  VHI. All rights reserved.  Arm Posterior: Elbow to Shoulder - Sweep   Pump _5__ times from back of elbow to top of shoulder. Then inner to outer upper arm _5_ times, then outer arm again _5_ times. Then back to the pathways _2-3_ times. Do _1__ time per day.  Copyright  VHI. All rights reserved.  ARM: [place] Wrist to Elbow - Sweep   Pump or stationary circles _5__ times from wrist to elbow making sure to do both sides of the forearm. Then retrace your steps to the outer arm, and the pathways _2-3_ times each. Do _1__ time per day.  Copyright  VHI. All rights reserved.  ARM: Dorsum of Hand to Shoulder - Sweep   Pump or stationary circles _5__ times on back of hand including knuckle spaces and individual fingers if needed working up towards the wrist, then retrace all your steps working back up the forearm, doing both sides; upper outer arm and back to your pathways _2-3_ times each. Then do 5 circles again at uninvolved armpit and involved groin where you started! Good job!! Do __1_ time per day.  Copyright  VHI. All rights reserved.

## 2022-09-11 ENCOUNTER — Ambulatory Visit: Payer: 59 | Admitting: Rehabilitation

## 2022-09-11 ENCOUNTER — Encounter: Payer: Self-pay | Admitting: Rehabilitation

## 2022-09-11 DIAGNOSIS — Z170 Estrogen receptor positive status [ER+]: Secondary | ICD-10-CM

## 2022-09-11 DIAGNOSIS — I890 Lymphedema, not elsewhere classified: Secondary | ICD-10-CM

## 2022-09-11 DIAGNOSIS — C50411 Malignant neoplasm of upper-outer quadrant of right female breast: Secondary | ICD-10-CM

## 2022-09-11 DIAGNOSIS — R293 Abnormal posture: Secondary | ICD-10-CM

## 2022-09-11 NOTE — Therapy (Signed)
OUTPATIENT PHYSICAL THERAPY ONCOLOGY TREATMENT  Patient Name: Raven Walker MRN: [id number] DOB:[birth year], 41 y.o., female Today's Date: 09/11/2022   PT End of Session - 09/11/22 1135     Visit Number 3    Number of Visits 10    Date for PT Re-Evaluation 09/30/22    PT Start Time 1004    PT Stop Time 1049    PT Time Calculation (min) 45 min    Activity Tolerance Patient tolerated treatment well    Behavior During Therapy [facility] for tasks assessed/performed             Past Medical History:  Diagnosis Date   Anemia    history   BV (bacterial vaginosis) 06/04/2014   ESRD on peritoneal dialysis ([place]) 10/05/2006   pt had kidney transplant 06/2012 and is no longer on dialysis   GERD (gastroesophageal reflux disease) 09/04/2009   ulcerative esophagitis per EGD    H/O kidney transplant 06/19/2012   Hematuria 07/02/2014   History of radiation therapy    Right Breast, left Breast- 05/26/22-07/13/22-Dr. Gery Pray   Hypertension    Poor appetite    History   Secondary hyperparathyroidism ([place])    s/p parathyroidectomy with autotransplantation to left arm per Dr. Baker Pierini on 08/04/11   Streptococcal peritonitis ([place]) 06/06/2011   SVD (spontaneous vaginal delivery)    x 1   Urinary frequency 07/02/2014   UTI (lower urinary tract infection) 07/02/2014   Vaginal discharge 06/04/2014   Past Surgical History:  Procedure Laterality Date   arm graphs  2008   three - done at [facility]; still in but never used because pt got a transplant.   AXILLARY SENTINEL NODE BIOPSY Left 03/04/2022   Procedure: LEFT AXILLARY SENTINEL NODE BIOPSY;  Surgeon: Rolm Bookbinder, MD;  Location: [place];  Service: General;  Laterality: Left;   BREAST LUMPECTOMY WITH RADIOACTIVE SEED LOCALIZATION Bilateral 03/04/2022   Procedure: BILATERAL BRACKETED BREAST LUMPECTOMY WITH RADIOACTIVE SEED LOCALIZATION;  Surgeon: Rolm Bookbinder, MD;  Location: [place];  Service: General;  Laterality:  Bilateral;   DILATATION & CURETTAGE/HYSTEROSCOPY WITH TRUECLEAR N/A 04/16/2014   Procedure: DILATATION & CURETTAGE/HYSTEROSCOPY WITH TRUCLEAR AND RESECTION OF FIBROID/THERMACHOICE ABLATION;  Surgeon: Jonnie Kind, MD;  Location: [place] [facility];  Service: Gynecology;  Laterality: N/A;   KIDNEY TRANSPLANT Right 2013   [place]  08/05/11   with graft of parathyroid into left upper arm.   RE-EXCISION OF BREAST LUMPECTOMY Left 04/14/2022   Procedure: LEFT BREAST RE-EXCISION LUMPECTOMY;  Surgeon: Rolm Bookbinder, MD;  Location: [place];  Service: General;  Laterality: Left;   [place] EXTRACTION     Patient Active Problem List   Diagnosis Date Noted   Malignant neoplasm of upper-outer quadrant of left breast in female, estrogen receptor positive ([name]) 02/09/2022   Ductal carcinoma in situ (DCIS) of right breast 02/09/2022   Fibroids 01/06/2018   Abnormal uterine bleeding (AUB) 08/15/2015   Menorrhagia with regular cycle 03/14/2015   Urinary frequency 07/02/2014   Hematuria 07/02/2014   UTI (lower urinary tract infection) 07/02/2014   Vaginal discharge 06/04/2014   BV (bacterial vaginosis) 06/04/2014   Post-operative state 05/02/2014   Submucous uterine fibroid with menorrhagia 02/05/2014   Sepsis ([name]) 11/20/2013   UTI (urinary tract infection) 11/20/2013   Myalgia 11/20/2013   Acute on chronic renal failure ([place]) 11/20/2013   Leukocytosis, unspecified 11/20/2013   Menorrhagia, premenopausal 11/15/2013   Fever 08/30/2013   Acute pyelonephritis 06/29/2013  History of renal transplant 06/29/2013   Other ureteric obstruction 08/11/2012   Lymphocele 07/15/2012   Edema of both legs 07/04/2012   Immunosuppression ([place]) 06/28/2012   Hypocalcemia syndrome 08/19/2011    Class: Acute   Renal failure (ARF), acute on chronic ([name]) 08/19/2011    Class: Acute   HTN (hypertension) 08/19/2011    Class: Acute   GERD (gastroesophageal reflux disease)  08/19/2011    Class: Chronic   Hyperparathyroidism, secondary ([name]) 05/19/2011    PCP: Celene Squibb, MD  REFERRING PROVIDER: Gery Pray, MD  REFERRING DIAG: [phone] (ICD-10-CM) - Malignant neoplasm of upper-outer quadrant of left breast in female, estrogen receptor positive ([place])   THERAPY DIAG:  Lymphedema, not elsewhere classified  Abnormal posture  Malignant neoplasm of upper-outer quadrant of left breast in female, estrogen receptor positive ([name])  Malignant neoplasm of upper-outer quadrant of right breast in female, estrogen receptor positive ([name])  ONSET DATE: 08/09/22  Rationale for Evaluation and Treatment: Rehabilitation  SUBJECTIVE:                                                                                                                                                                                           SUBJECTIVE STATEMENT: Kept bandages on until now  PERTINENT HISTORY:  Patient was diagnosed on 01/26/2022 with bilateral  breast Cancer with Right  gr. 2 DCIS ER, PR + and left Invasive ductal carcinoma. It measures .6 cm and is located in the upper outer quadrant. It is ER, PR +, Her 2 - with a Ki67 of 1%. She is s/p bilateral breast lumpectomies and left SLNB on 03/04/2022.  She is scheduled for a left breast re-excision on 04/14/2022. She is also to have radiation and anti estrogen. She is s/p a kidney transplant in 2013 and is doing very well PAIN:  Are you having pain? No  PRECAUTIONS: Kidney transplant 2013 , grafts bilateral arms from prior peritoneal dialysis   WEIGHT BEARING RESTRICTIONS: No  FALLS:  Has patient fallen in last 6 months? No  LIVING ENVIRONMENT: Lives with: lives with their spouse and lives with their son Lives in: House/apartment Stairs: Yes; Internal: 15 steps; on right going up Has following equipment at home: None  OCCUPATION: works at [employer], standing all day, usually 5-6 #   LEISURE: shoulder ROM  exercises  HAND DOMINANCE: right   PRIOR LEVEL OF FUNCTION: Independent  PATIENT GOALS: to get back to normal ASAP   OBJECTIVE:  COGNITION: Overall cognitive status: Within functional limits for tasks assessed   PALPATION: Fullness palpable at dorsum of hand  OBSERVATIONS / OTHER ASSESSMENTS: visible edema  as dorsum of L hand  POSTURE: forward head, rounded shoulders  UPPER EXTREMITY AROM/PROM: WFL    LYMPHEDEMA ASSESSMENTS:   SURGERY TYPE/DATE: bilateral breast mastectomy and L SLNB on 03/04/22  NUMBER OF LYMPH NODES REMOVED: 0/3  CHEMOTHERAPY: none  RADIATION:completed 07/13/22  HORMONE TREATMENT: currently on Tamoxifen  INFECTIONS: none  LYMPHEDEMA ASSESSMENTS:   LANDMARK RIGHT  eval  10 cm proximal to olecranon process 39  Olecranon process 28.6  10 cm proximal to ulnar styloid process 23  Just proximal to ulnar styloid process 17.8  Across hand at thumb web space 21  At base of 2nd digit 6.7  (Blank rows = not tested)  LANDMARK LEFT  eval  10 cm proximal to olecranon process 40  Olecranon process 29  10 cm proximal to ulnar styloid process 24.5  Just proximal to ulnar styloid process 18.6  Across hand at thumb web space 22.1  At base of 2nd digit 7.2  (Blank rows = not tested)  L-DEX LYMPHEDEMA SCREENING:  The patient was assessed using the L-Dex machine today to produce a lymphedema index baseline score. The patient will be reassessed on a regular basis (typically every 3 months) to obtain new L-Dex scores. If the score is > 6.5 points away from his/her baseline score indicating onset of subclinical lymphedema, it will be recommended to wear a compression garment for 4 weeks, 12 hours per day and then be reassessed. If the score continues to be > 6.5 points from baseline at reassessment, we will initiate lymphedema treatment. Assessing in this manner has a 95% rate of preventing clinically significant lymphedema.      TODAY'S TREATMENT:                                                                                                                                          DATE:  09/11/22: Removed bandages and measured pt for custom mondi 350: emailed cassidy at [facility] for some clarification on choices and then will send order and insurance to [place] [facility].  Discussed how pt will let us know if she gets the sleeve and glove and it does not seem to fit correctly.  Let her know about how benefits will be checked prior to order.  Pt would like to let us know if she needs further visits before rechecking SOZO.   09/09/22:  Educated pt in self MLD as follows: In sitting: Short neck, 5 diaphragmatic breaths, R axillary nodes and establishment of interaxillary pathway, L inguinal nodes and establishment of axilloinguinal pathway, then L UE working proximal to distal, moving fluid from upper inner arm outwards, and doing both sides of forearm moving fluid towards pathways spending extra time in any areas of fibrosis then retracing all steps with pt return demonstrating each step and able to demonstrate appropriate skin stretch and sequence with min cueing Applied compression bandaging prior to pt being measured for  garments on Friday as follows: TG soft from hand to axilla, elastomull to digits 1-5 covered with paper tape, artiflex from hand to axilla, 1 6cm bandage at hand, 1 8 cm bandage from wrist to axilla in herringbone pattern, 1 10 cm bandage from wrist to axilla in spiral and 1 12 cm bandage from wrist to axilla in spiral. Educated pt to remove bandages if she has discomfort, numbness or tingling that does not go away when she moves her arm or wrist Repeated SOZO and pt is now in the yellow - educated pt about subclinical lymphedema and how it can be reversed with wearing a compression sleeve 12 hrs a day for 4 weeks.  08/19/22- Educated pt about lymphedema and how to treat it including MLD and compression garments. Measured pt for compression sleeve  and glove but she did not fit in to an off the shelf one and will require a custom.   PATIENT EDUCATION:  Education details: subclinical lymphedema and need for compression sleeve Person educated: Patient Education method: Explanation Education comprehension: verbalized understanding  HOME EXERCISE PROGRAM: NA  ASSESSMENT:  CLINICAL IMPRESSION: Measured today for garments.    OBJECTIVE IMPAIRMENTS: decreased knowledge of condition and increased edema.   ACTIVITY LIMITATIONS:  none  PARTICIPATION LIMITATIONS:  none  PERSONAL FACTORS:  none  are also affecting patient's functional outcome.   REHAB POTENTIAL: Good  CLINICAL DECISION MAKING: Stable/uncomplicated  EVALUATION COMPLEXITY: Low  GOALS: Goals reviewed with patient? Yes  SHORT TERM GOALS=LONG TERM GOALS Target date: 09/16/2022    Pt will be independent in self MLD for long term management of lymphedema.  Baseline: Goal status: INITIAL  2.  Pt will obtain appropriate compression garments for long term management of lymphedema.  Baseline:  Goal status: INITIAL  3.  Pt will demonstrate a 1cm decrease in edema at dorsum of hand.  Baseline:  Goal status: INITIAL  PLAN:  PT FREQUENCY: 1-2x/week  PT DURATION: 6 weeks  PLANNED INTERVENTIONS: Therapeutic exercises, Patient/Family education, Self Care, Orthotic/Fit training, Manual lymph drainage, Compression bandaging, Taping, and Vasopneumatic device  PLAN FOR NEXT SESSION: as needed / sozo and bandaging   Stark Bray, PT 09/11/2022, 11:36 AM

## 2022-10-05 DIAGNOSIS — C801 Malignant (primary) neoplasm, unspecified: Secondary | ICD-10-CM

## 2022-10-05 HISTORY — DX: Malignant (primary) neoplasm, unspecified: C80.1

## 2022-10-08 ENCOUNTER — Encounter: Payer: 59 | Admitting: Adult Health

## 2022-10-09 ENCOUNTER — Encounter: Payer: Self-pay | Admitting: Rehabilitation

## 2022-11-10 ENCOUNTER — Encounter: Payer: Self-pay | Admitting: Adult Health

## 2022-11-10 ENCOUNTER — Inpatient Hospital Stay: Payer: 59 | Attending: Adult Health | Admitting: Adult Health

## 2022-11-10 ENCOUNTER — Telehealth: Payer: Self-pay

## 2022-11-10 ENCOUNTER — Other Ambulatory Visit: Payer: Self-pay

## 2022-11-10 VITALS — BP 124/81 | HR 73 | Temp 97.2°F | Resp 20 | Wt 225.7 lb

## 2022-11-10 DIAGNOSIS — Z7182 Exercise counseling: Secondary | ICD-10-CM | POA: Insufficient documentation

## 2022-11-10 DIAGNOSIS — Z79899 Other long term (current) drug therapy: Secondary | ICD-10-CM | POA: Diagnosis not present

## 2022-11-10 DIAGNOSIS — Z170 Estrogen receptor positive status [ER+]: Secondary | ICD-10-CM | POA: Diagnosis not present

## 2022-11-10 DIAGNOSIS — Z823 Family history of stroke: Secondary | ICD-10-CM | POA: Diagnosis not present

## 2022-11-10 DIAGNOSIS — D0511 Intraductal carcinoma in situ of right breast: Secondary | ICD-10-CM | POA: Diagnosis not present

## 2022-11-10 DIAGNOSIS — Z5986 Financial insecurity: Secondary | ICD-10-CM | POA: Diagnosis not present

## 2022-11-10 DIAGNOSIS — C50412 Malignant neoplasm of upper-outer quadrant of left female breast: Secondary | ICD-10-CM | POA: Diagnosis present

## 2022-11-10 DIAGNOSIS — Z940 Kidney transplant status: Secondary | ICD-10-CM | POA: Insufficient documentation

## 2022-11-10 DIAGNOSIS — Z833 Family history of diabetes mellitus: Secondary | ICD-10-CM | POA: Diagnosis not present

## 2022-11-10 DIAGNOSIS — Z79810 Long term (current) use of selective estrogen receptor modulators (SERMs): Secondary | ICD-10-CM | POA: Insufficient documentation

## 2022-11-10 DIAGNOSIS — Z923 Personal history of irradiation: Secondary | ICD-10-CM | POA: Insufficient documentation

## 2022-11-10 DIAGNOSIS — Z8349 Family history of other endocrine, nutritional and metabolic diseases: Secondary | ICD-10-CM | POA: Diagnosis not present

## 2022-11-10 DIAGNOSIS — Z8249 Family history of ischemic heart disease and other diseases of the circulatory system: Secondary | ICD-10-CM | POA: Insufficient documentation

## 2022-11-10 DIAGNOSIS — Z809 Family history of malignant neoplasm, unspecified: Secondary | ICD-10-CM | POA: Insufficient documentation

## 2022-11-10 NOTE — Progress Notes (Signed)
SURVIVORSHIP VISIT:   BRIEF ONCOLOGIC HISTORY:  Oncology History  Malignant neoplasm of upper-outer quadrant of left breast in female, estrogen receptor positive ([place])  01/22/2022 Initial Diagnosis   Work-up performed for intermittent bloody left nipple discharge.  Mammogram and ultrasound revealed bilateral breast masses  Right breast 2 hypoechoic masses 1.6 cm and 0.6 cm.  No axillary lymph nodes.  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 Left lumpectomy: Grade 1 IDC 1.6 cm with DCIS margins negative; Left second lumpectomy: Grade 2 IDC 1 cm, DCIS, DCIS focally involves lateral margin, 0/3 lymph nodes negative   03/04/2022 Cancer Staging   Staging form: Breast, AJCC 8th Edition - Pathologic stage from 03/04/2022: Stage IA (pT1c, pN0, cM0, G2, ER+, PR+, HER2-) - Signed by Gardenia Phlegm, NP on 11/10/2022 Histologic grading system: 3 grade system   05/26/2022 - 07/13/2022 Radiation Therapy   Site Technique Total Dose (Gy) Dose per Fx (Gy) Completed Fx Beam Energies  Breast, Right: Breast_R 3D 50.4/50.4 1.8 28/28 10X  Breast, Right: Breast_R_Bst specialPort 12/12 2 6/6 12E, 15E  Breast, Left: Breast_L 3D 50.4/50.4 1.8 28/28 10X  Breast, Left: Breast_L_Bst 3D 12/12 2 6/6 6X, 10X     07/2022 -  Anti-estrogen oral therapy   Tamoxifen   Ductal carcinoma in situ (DCIS) of right breast  02/09/2022 Initial Diagnosis   Ductal carcinoma in situ (DCIS) of right breast   03/04/2022 Cancer Staging   Staging form: Breast, AJCC 8th Edition - Pathologic stage from 03/04/2022: Stage 0 (pTis  (DCIS), pN0, cM0, ER+, PR+) - Signed by Gardenia Phlegm, NP on 11/10/2022 Nuclear grade: G3     INTERVAL HISTORY:  Ms. Market to review her survivorship care plan detailing her treatment course for breast cancer, as well as monitoring long-term side effects of that treatment, education regarding health maintenance, screening, and overall wellness and health promotion.     Overall, Raven Walker reports feeling quite well.  She is taking tamoxifen daily and tells me that she tolerates it quite well.  REVIEW OF SYSTEMS:  Review of Systems  Constitutional:  Negative for appetite change, chills, fatigue, fever and unexpected weight change.  HENT:   Negative for hearing loss, lump/mass and trouble swallowing.   Eyes:  Negative for eye problems and icterus.  Respiratory:  Negative for chest tightness, cough and shortness of breath.   Cardiovascular:  Negative for chest pain, leg swelling and palpitations.  Gastrointestinal:  Negative for abdominal distention, abdominal pain, constipation, diarrhea, nausea and vomiting.  Endocrine: Negative for hot flashes.  Genitourinary:  Negative for difficulty urinating.   Musculoskeletal:  Negative for arthralgias.  Skin:  Negative for itching and rash.  Neurological:  Negative for dizziness, extremity weakness, headaches and numbness.  Hematological:  Negative for adenopathy. Does not bruise/bleed easily.  Psychiatric/Behavioral:  Negative for depression. The patient is not nervous/anxious.   Breast: Denies any new nodularity, masses, tenderness, nipple changes, or nipple discharge.     PAST MEDICAL/SURGICAL HISTORY:  Past Medical History:  Diagnosis Date   Anemia    history   BV (bacterial vaginosis) 06/04/2014   ESRD on peritoneal dialysis ([place]) 10/05/2006  pt had kidney transplant 06/2012 and is no longer on dialysis   GERD (gastroesophageal reflux disease) 09/04/2009   ulcerative esophagitis per EGD    H/O  kidney transplant 06/19/2012   Hematuria 07/02/2014   History of radiation therapy    Right Breast, left Breast- 05/26/22-07/13/22-Dr. Gery Pray   Hypertension    Poor appetite    History   Secondary hyperparathyroidism ([place])    s/p parathyroidectomy with autotransplantation to left arm per Dr. Baker Pierini on 08/04/11   Streptococcal peritonitis ([place]) 06/06/2011   SVD (spontaneous vaginal delivery)    x 1   Urinary frequency 07/02/2014   UTI (lower urinary tract infection) 07/02/2014   Vaginal discharge 06/04/2014   Past Surgical History:  Procedure Laterality Date   arm graphs  2008   three - done at [facility]; still in but never used because pt got a transplant.   AXILLARY SENTINEL NODE BIOPSY Left 03/04/2022   Procedure: LEFT AXILLARY SENTINEL NODE BIOPSY;  Surgeon: Rolm Bookbinder, MD;  Location: [place];  Service: General;  Laterality: Left;   BREAST LUMPECTOMY WITH RADIOACTIVE SEED LOCALIZATION Bilateral 03/04/2022   Procedure: BILATERAL BRACKETED BREAST LUMPECTOMY WITH RADIOACTIVE SEED LOCALIZATION;  Surgeon: Rolm Bookbinder, MD;  Location: [place];  Service: General;  Laterality: Bilateral;   DILATATION & CURETTAGE/HYSTEROSCOPY WITH TRUECLEAR N/A 04/16/2014   Procedure: DILATATION & CURETTAGE/HYSTEROSCOPY WITH TRUCLEAR AND RESECTION OF FIBROID/THERMACHOICE ABLATION;  Surgeon: Jonnie Kind, MD;  Location: [facility];  Service: Gynecology;  Laterality: N/A;   KIDNEY TRANSPLANT Right 2013   [place]  08/05/11   with graft of parathyroid into left upper arm.   RE-EXCISION OF BREAST LUMPECTOMY Left 04/14/2022   Procedure: LEFT BREAST RE-EXCISION LUMPECTOMY;  Surgeon: Rolm Bookbinder, MD;  Location: [place];  Service: General;  Laterality: Left;   WISDOM TOOTH EXTRACTION       ALLERGIES:  Allergies  Allergen Reactions   Nickel Itching and Rash    Only where touched     CURRENT MEDICATIONS:  Outpatient Encounter Medications  as of 11/10/2022  Medication Sig   acetaminophen (TYLENOL) 500 MG tablet Take 500-1,000 mg by mouth every 6 (six) hours as needed for moderate pain.   calcitRIOL (ROCALTROL) 0.25 MCG capsule Take 0.5 mcg by mouth daily.   calcium carbonate (TUMS - DOSED IN MG ELEMENTAL CALCIUM) 500 MG chewable tablet Chew 1 tablet by mouth 2 (two) times daily.   diltiazem (CARDIZEM) 30 MG tablet Take 30 mg by mouth 2 (two) times daily.   furosemide (LASIX) 40 MG tablet Take 40 mg by mouth 2 (two) times daily as needed for edema.   Iron-FA-B Cmp-C-Biot-Probiotic (FUSION PLUS) CAPS Take 1 capsule by mouth daily.   magnesium oxide (MAG-OX) 400 (240 Mg) MG tablet Take 1 tablet by mouth 2 (two) times daily.   metoprolol tartrate (LOPRESSOR) 50 MG tablet Take 50 mg by mouth 2 (two) times daily.   Multiple Vitamins-Minerals (HAIR/SKIN/NAILS) TABS Take 2 tablets by mouth daily.   mycophenolate (CELLCEPT) 250 MG capsule Take 750 mg by mouth 2 (two) times daily.    pantoprazole (PROTONIX) 40 MG tablet Take 40 mg by mouth daily as needed (acid reflux).   tacrolimus (PROGRAF) 1 MG capsule Take 3 capsules (3 mg total) by mouth 2 (two) times daily.   tamoxifen (NOLVADEX) 20 MG tablet Take 1 tablet (20 mg total) by mouth daily.   No facility-administered encounter medications on file as of 11/10/2022.  ONCOLOGIC FAMILY HISTORY:  Family History  Problem Relation Age of Onset   Hypertension Mother    Hypertension Father    Obesity Son    Cancer Maternal Uncle    Diabetes Maternal Grandmother    Stroke Maternal Grandfather       SOCIAL HISTORY:  Social History   Socioeconomic History   Marital status: Married    Spouse name: Not on file   Number of children: Not on file   Years of education: Not on file   Highest education level: Not on file  Occupational History   Not on file  Tobacco Use   Smoking status: Former    Packs/day: 0.25    Years: 2.00    Total pack years: 0.50    Types: Cigarettes    Quit  date: 10/06/1999    Years since quitting: 23.1   Smokeless tobacco: Never  Vaping Use   Vaping Use: Never used  Substance and Sexual Activity   Alcohol use: Yes    Comment: Once per month   Drug use: No   Sexual activity: Yes    Birth control/protection: None    Comment: ablation  Other Topics Concern   Not on file  Social History Narrative   ** Merged History Encounter **       Social Determinants of Health   Financial Resource Strain: Medium Risk (03/16/2022)   Overall Financial Resource Strain (CARDIA)    Difficulty of Paying Living Expenses: Somewhat hard  Food Insecurity: Not on file  Transportation Needs: Not on file  Physical Activity: Not on file  Stress: Not on file  Social Connections: Not on file  Intimate Partner Violence: Not on file     OBSERVATIONS/OBJECTIVE:  BP 124/81   Pulse 73   Temp (!) 97.2 F (36.2 C)   Resp 20   Wt 225 lb 11.2 oz (102.4 kg)   SpO2 100%   BMI 33.33 kg/m  GENERAL: Patient is a well appearing female in no acute distress HEENT:  Sclerae anicteric.  Oropharynx clear and moist. No ulcerations or evidence of oropharyngeal candidiasis. Neck is supple.  NODES:  No cervical, supraclavicular, or axillary lymphadenopathy palpated.  BREAST EXAM: Right breast is status postlumpectomy and radiation no sign of local recurrence left breast is status postlumpectomy and radiation no sign of local recurrence LUNGS:  Clear to auscultation bilaterally.  No wheezes or rhonchi. HEART:  Regular rate and rhythm. No murmur appreciated. ABDOMEN:  Soft, nontender.  Positive, normoactive bowel sounds. No organomegaly palpated. MSK:  No focal spinal tenderness to palpation. Full range of motion bilaterally in the upper extremities. EXTREMITIES:  No peripheral edema.   SKIN:  Clear with no obvious rashes or skin changes. No nail dyscrasia. NEURO:  Nonfocal. Well oriented.  Appropriate affect.   LABORATORY DATA:  None for this visit.  DIAGNOSTIC IMAGING:   None for this visit.      ASSESSMENT AND PLAN:  Ms.. Aul is a pleasant 42 y.o. female with bilateral breast cancer, ER+/PR+/HER2-, diagnosed in April 2023, treated with lumpectomies, adjuvant radiation therapy, and anti-estrogen therapy with tamoxifen beginning in 07/2022.  She presents to the Survivorship Clinic for our initial meeting and routine follow-up post-completion of treatment for breast cancer.    1.  Bilateral breast cancer:  Ms. Mccammon is continuing to recover from definitive treatment for breast cancer. She will follow-up with her medical oncologist, Dr. Lindi Adie in 6 years with history and physical exam per surveillance protocol.  She will  continue her anti-estrogen therapy with tamoxifen. Thus far, she is tolerating the tamoxifen well, with minimal side effects.   Her mammogram is due 01/2023; orders placed today. Today, a comprehensive survivorship care plan and treatment summary was reviewed with the patient today detailing her breast cancer diagnosis, treatment course, potential late/long-term effects of treatment, appropriate follow-up care with recommendations for the future, and patient education resources.  A copy of this summary, along with a letter will be sent to the patient's primary care provider via mail/fax/In Basket message after today's visit.    2. Bone health:  She was given education on specific activities to promote bone health.  3. Cancer screening:  Due to Raven Walker's history and her age, she should receive screening for skin cancers, colon cancer, and gynecologic cancers.  The information and recommendations are listed on the patient's comprehensive care plan/treatment summary and were reviewed in detail with the patient.    4. Health maintenance and wellness promotion: Ms. Appelhans was encouraged to consume 5-7 servings of fruits and vegetables per day. We reviewed the "Nutrition Rainbow" handout.  She was also  encouraged to engage in moderate to vigorous exercise for 30 minutes per day most days of the week.  She was instructed to limit her alcohol consumption and continue to abstain from tobacco use.     5. Support services/counseling: It is not uncommon for this period of the patient's cancer care trajectory to be one of many emotions and stressors. She was given information regarding our available services and encouraged to contact me with any questions or for help enrolling in any of our support group/programs.    Follow up instructions:    -Return to cancer center 6 months for f/u with Dr. Lindi Adie  -Mammogram due in 01/2023 -She is welcome to return back to the Survivorship Clinic at any time; no additional follow-up needed at this time.  -Consider referral back to survivorship as a long-term survivor for continued surveillance  The patient was provided an opportunity to ask questions and all were answered. The patient agreed with the plan and demonstrated an understanding of the instructions.   Total encounter time:45 minutes*in face-to-face visit time, chart review, lab review, care coordination, order entry, and documentation of the encounter time.    Wilber Bihari, NP 11/10/22 4:42 PM Medical Oncology and Hematology [facility] [place], [place] [address] Tel. [phone]    Fax. [phone]  *Total Encounter Time as defined by the Centers for Medicare and Medicaid Services includes, in addition to the face-to-face time of a patient visit (documented in the note above) non-face-to-face time: obtaining and reviewing outside history, ordering and reviewing medications, tests or procedures, care coordination (communications with other health care professionals or caregivers) and documentation in the medical record.

## 2022-11-10 NOTE — Telephone Encounter (Signed)
Spoke with [facility] surgery and requested them to fax patients genetic testing over per Wilber Bihari NP. They agreed to fax it over at [phone].

## 2022-11-11 ENCOUNTER — Telehealth: Payer: Self-pay | Admitting: Adult Health

## 2022-11-11 NOTE — Telephone Encounter (Signed)
Scheduled appointment per 2/5 los. Left voicemail.

## 2022-11-19 ENCOUNTER — Ambulatory Visit: Payer: 59 | Admitting: Radiation Oncology

## 2022-11-23 NOTE — Progress Notes (Signed)
Vianka Ertel is here today for follow up post radiation to the breast.   Breast Side:Left and Right   They completed their radiation on: 07/13/22  Does the patient complain of any of the following: Post radiation skin issues: Continues to be hyperpigmented.  Breast Tenderness: Yes, to left breast.  Breast Swelling: No Lymphadema: Yes to left arm.Patient waiting for custom lymphedema sleeve.  Range of Motion limitations: No Fatigue post radiation: Yes, continues to improve.  Appetite good/fair/poor: Good   Additional comments if applicable:    BP (!) 244/01 (BP Location: Left Arm, Patient Position: Sitting)   Pulse 82   Temp (!) 96.2 F (35.7 C) (Temporal)   Resp 18   Ht $R'5\' 9"'iH$  (1.753 m)   Wt 227 lb (103 kg)   SpO2 100%   BMI 33.52 kg/m

## 2022-11-28 NOTE — Progress Notes (Signed)
Radiation Oncology         [phone] ________________________________  Name: Raven Walker MRN: [id number]  Date: 11/30/2022  DOB: 11-Dec-1980  Follow-Up Visit Note  CC: Celene Squibb, MD  Celene Squibb, MD  No diagnosis found.  Diagnosis:  S/p bilateral lumpectomies and radiation:    Stage 0 (cTis (DCIS), cN0, cM0) Right Breast, Intermediate to high-grade ductal carcinoma in-situ, ER+ / PR+ / Her2 not assessed   Stage IA (cT1c, cN0) Left Breast UOQ, 2 foci of invasive ductal carcinoma with intermediate grade DCIS measuring, ER+ / PR+ / Her2-, Grade 2  Interval Since Last Radiation: 4 months and 17 days   Intent: Curative  Radiation Treatment Dates: 05/26/2022 through 07/13/2022 Site Technique Total Dose (Gy) Dose per Fx (Gy) Completed Fx Beam Energies  Breast, Right: Breast_R 3D 50.4/50.4 1.8 28/28 10X  Breast, Right: Breast_R_Bst specialPort 12/12 2 6/6 12E, 15E  Breast, Left: Breast_L 3D 50.4/50.4 1.8 28/28 10X  Breast, Left: Breast_L_Bst 3D 12/12 2 6/6 6X, 10X   Narrative:  The patient returns today for routine follow-up. She was last seen here for follow-up on 08/13/22. Since her last visit, the patient met with medical oncology on 11/10/22 for follow-up and to review her survivorship care plan. During this visit, the patient was noted to be tolerating tamoxifen well without any side effects. She has also continued to meet with OP rehab to address post-op lymphedema.    No other significant interval history since the patient was last seen for follow-up.   ***                            Allergies:  is allergic to nickel.  Meds: Current Outpatient Medications  Medication Sig Dispense Refill   acetaminophen (TYLENOL) 500 MG tablet Take 500-1,000 mg by mouth every 6 (six) hours as needed for moderate pain.     calcitRIOL (ROCALTROL) 0.25 MCG capsule Take 0.5 mcg by mouth daily.     calcium carbonate (TUMS - DOSED IN MG ELEMENTAL CALCIUM) 500 MG chewable tablet  Chew 1 tablet by mouth 2 (two) times daily.     diltiazem (CARDIZEM) 30 MG tablet Take 30 mg by mouth 2 (two) times daily.     furosemide (LASIX) 40 MG tablet Take 40 mg by mouth 2 (two) times daily as needed for edema.     Iron-FA-B Cmp-C-Biot-Probiotic (FUSION PLUS) CAPS Take 1 capsule by mouth daily.     magnesium oxide (MAG-OX) 400 (240 Mg) MG tablet Take 1 tablet by mouth 2 (two) times daily.     metoprolol tartrate (LOPRESSOR) 50 MG tablet Take 50 mg by mouth 2 (two) times daily.     Multiple Vitamins-Minerals (HAIR/SKIN/NAILS) TABS Take 2 tablets by mouth daily.     mycophenolate (CELLCEPT) 250 MG capsule Take 750 mg by mouth 2 (two) times daily.      pantoprazole (PROTONIX) 40 MG tablet Take 40 mg by mouth daily as needed (acid reflux).     tacrolimus (PROGRAF) 1 MG capsule Take 3 capsules (3 mg total) by mouth 2 (two) times daily.     tamoxifen (NOLVADEX) 20 MG tablet Take 1 tablet (20 mg total) by mouth daily. 90 tablet 3   No current facility-administered medications for this encounter.    Physical Findings: The patient is in no acute distress. Patient is alert and oriented.  vitals were not taken for this visit. .  No significant changes. Lungs are  clear to auscultation bilaterally. Heart has regular rate and rhythm. No palpable cervical, supraclavicular, or axillary adenopathy. Abdomen soft, non-tender, normal bowel sounds.  Left Breast: ***  no palpable mass, nipple discharge or bleeding.  Right Breast: *** no palpable mass, nipple discharge or bleeding.   Lab Findings: Lab Results  Component Value Date   WBC 5.6 02/20/2022   HGB 10.7 (L) 02/20/2022   HCT 32.8 (L) 02/20/2022   MCV 82.6 02/20/2022   PLT 227 02/20/2022    Radiographic Findings: No results found.  Impression:  S/p bilateral lumpectomies and radiation:    Stage 0 (cTis (DCIS), cN0, cM0) Right Breast, Intermediate to high-grade ductal carcinoma in-situ, ER+ / PR+ / Her2 not assessed   Stage IA (cT1c,  cN0) Left Breast UOQ, 2 foci of invasive ductal carcinoma with intermediate grade DCIS measuring, ER+ / PR+ / Her2-, Grade 2  The patient is recovering from the effects of radiation.  ***  Plan:  ***   *** minutes of total time was spent for this patient encounter, including preparation, face-to-face counseling with the patient and coordination of care, physical exam, and documentation of the encounter. ____________________________________  Blair Promise, PhD, MD  This document serves as a record of services personally performed by Gery Pray, MD. It was created on his behalf by Roney Mans, a trained medical scribe. The creation of this record is based on the scribe's personal observations and the provider's statements to them. This document has been checked and approved by the attending provider.

## 2022-11-30 ENCOUNTER — Encounter: Payer: Self-pay | Admitting: Radiation Oncology

## 2022-11-30 ENCOUNTER — Ambulatory Visit
Admission: RE | Admit: 2022-11-30 | Discharge: 2022-11-30 | Disposition: A | Discharge status: Home / Self Care | Payer: 59 | Source: Ambulatory Visit | Attending: Radiation Oncology | Admitting: Radiation Oncology

## 2022-11-30 DIAGNOSIS — Z79899 Other long term (current) drug therapy: Secondary | ICD-10-CM | POA: Insufficient documentation

## 2022-11-30 DIAGNOSIS — C50412 Malignant neoplasm of upper-outer quadrant of left female breast: Secondary | ICD-10-CM | POA: Diagnosis not present

## 2022-11-30 DIAGNOSIS — Z170 Estrogen receptor positive status [ER+]: Secondary | ICD-10-CM | POA: Insufficient documentation

## 2022-11-30 DIAGNOSIS — Z79624 Long term (current) use of inhibitors of nucleotide synthesis: Secondary | ICD-10-CM | POA: Diagnosis not present

## 2022-11-30 DIAGNOSIS — Z923 Personal history of irradiation: Secondary | ICD-10-CM | POA: Insufficient documentation

## 2022-11-30 DIAGNOSIS — Z79621 Long term (current) use of calcineurin inhibitor: Secondary | ICD-10-CM | POA: Insufficient documentation

## 2022-11-30 DIAGNOSIS — Z79810 Long term (current) use of selective estrogen receptor modulators (SERMs): Secondary | ICD-10-CM | POA: Insufficient documentation

## 2022-11-30 DIAGNOSIS — I890 Lymphedema, not elsewhere classified: Secondary | ICD-10-CM | POA: Diagnosis not present

## 2022-11-30 DIAGNOSIS — D0511 Intraductal carcinoma in situ of right breast: Secondary | ICD-10-CM | POA: Diagnosis present

## 2022-12-28 ENCOUNTER — Encounter: Payer: Self-pay | Admitting: Rehabilitation

## 2023-01-04 ENCOUNTER — Other Ambulatory Visit (HOSPITAL_COMMUNITY): Payer: Self-pay | Admitting: Adult Health

## 2023-01-04 DIAGNOSIS — Z9889 Other specified postprocedural states: Secondary | ICD-10-CM

## 2023-01-06 ENCOUNTER — Telehealth: Payer: Self-pay | Admitting: Hematology and Oncology

## 2023-01-06 NOTE — Telephone Encounter (Signed)
Scheduled appointment per patients request. Patient is aware of the made appointment. 

## 2023-01-07 ENCOUNTER — Telehealth: Payer: Self-pay | Admitting: *Deleted

## 2023-01-07 NOTE — Telephone Encounter (Signed)
01/06/2003 Late entry. Today received three pages of nine of an [employer] "Transition Coverage Request" form.  Post-it note reads 'Faxed 12/25/2022 yet not on form tracker'. Have never received this or any form of this type to process.   Connected with [name] [phone]).  "Spoke with insurance about letter received in December about last year's treatments because Dr. Sondra Come is out of network.  I told them I do not wish to change providers is why this was sent to be completed with both of my providers information.  Some pages were in other languages, so I faxed three pages.  I know I'll need to see Dr. Sondra Come at least once or twice this year." Advised this nurse completes disability, FMLA and will try to assist however radiation staff may be the best to process this request.  Connected with radiation nurse to advise and obtain 2023 radiation appointment information for the following request and next appointments.  Description of diagnoses with ICD codes. Description of treatments, procedures all CPT codes. Dates of surgery. Date care initiated. Dates of current treatment.  Number of additional treatment visits needed.   Form picked up by nurse to try billing or insurance authorization.  No further actions performed or required by this nurse.

## 2023-01-19 NOTE — Progress Notes (Signed)
HEMATOLOGY-ONCOLOGY TELEPHONE VISIT PROGRESS NOTE  I connected with our patient on 01/21/23 at  8:00 AM EDT by telephone and verified that I am speaking with the correct person using two identifiers.  I discussed the limitations, risks, security and privacy concerns of performing an evaluation and management service by telephone and the availability of in person appointments.  I also discussed with the patient that there may be a patient responsible charge related to this service. The patient expressed understanding and agreed to proceed.   History of Present Illness:  Raven Walker is a 42 y.o. female is here because of recent diagnosis of bilateral breast cancers. She presents to the clinic today for a telephone follow-up   Oncology History  Malignant neoplasm of upper-outer quadrant of left breast in female, estrogen receptor positive  01/22/2022 Initial Diagnosis   Work-up performed for intermittent bloody left nipple discharge.  Mammogram and ultrasound revealed bilateral breast masses  Right breast 2 hypoechoic masses 1.6 cm and 0.6 cm.  No axillary lymph nodes.  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 Left lumpectomy: Grade 1 IDC 1.6 cm with DCIS margins negative; Left second lumpectomy: Grade 2 IDC 1 cm, DCIS, DCIS focally involves lateral margin, 0/3 lymph nodes negative   03/04/2022 Cancer Staging   Staging form: Breast, AJCC 8th Edition - Pathologic stage from 03/04/2022: Stage IA (pT1c, pN0, cM0, G2, ER+, PR+, HER2-) - Signed by Loa Socks, NP on  11/10/2022 Histologic grading system: 3 grade system   05/26/2022 - 07/13/2022 Radiation Therapy   Site Technique Total Dose (Gy) Dose per Fx (Gy) Completed Fx Beam Energies  Breast, Right: Breast_R 3D 50.4/50.4 1.8 28/28 10X  Breast, Right: Breast_R_Bst specialPort 12/12 2 6/6 12E, 15E  Breast, Left: Breast_L 3D 50.4/50.4 1.8 28/28 10X  Breast, Left: Breast_L_Bst 3D 12/12 2 6/6 6X, 10X     07/2022 -  Anti-estrogen oral therapy   Tamoxifen   Ductal carcinoma in situ (DCIS) of right breast  02/09/2022 Initial Diagnosis   Ductal carcinoma in situ (DCIS) of right breast   03/04/2022 Cancer Staging   Staging form: Breast, AJCC 8th Edition - Pathologic stage from 03/04/2022: Stage 0 (pTis (DCIS), pN0, cM0, ER+, PR+) - Signed by Loa Socks, NP on 11/10/2022 Nuclear grade: G3     REVIEW OF SYSTEMS:   Constitutional: Denies fevers, chills or abnormal weight loss All other systems were reviewed with the patient and are negative. Observations/Objective:     Assessment Plan:  Malignant neoplasm of upper-outer quadrant of left breast in female, estrogen receptor positive (HCC) Bilateral lumpectomies 03/04/2022 Right lumpectomy: Intermediate grade to high-grade DCIS with necrosis and calcifications, right medial margin: Intermediate grade DCIS, right superior margin: Intermediate grade DCIS, final margins negative, right additional inferior margin: DCIS. Left lumpectomy: Grade 1 IDC 1.6 cm with DCIS margins negative; Left second lumpectomy: Grade 2 IDC 1 cm, DCIS, DCIS focally involves lateral margin, 0/3 lymph nodes negative   Treatment plan: 1. Oncotype DX score: 5 (ROR 3%) 2. Bilateral radiation therapies: 05/27/2022-07/13/2022 3.  Antiestrogen therapy started 07/27/2022    Tamoxifen toxicities: Cycles returned Hair thinning Slight mental clouding  We discussed and reduce the dosage of tamoxifen to 10 mg a day  Breast cancer surveillance: Breast exam 01/21/2023:  Benign Mammogram and ultrasound being performed today at [facility]  I will move her next appointment to the end of the year    I discussed the assessment and treatment plan with the patient. The patient was provided an opportunity to ask questions and all were answered. The patient agreed with the plan and demonstrated an understanding of the instructions. The patient was advised to call back or seek an in-person evaluation if the symptoms worsen or if the condition fails to improve as anticipated.   I provided 12 minutes of non-face-to-face time during this encounter.  This includes time for charting and coordination of care   Tamsen Meek, MD  I Janan Ridge am acting as a scribe for Dr.Roshawnda Pecora  I have reviewed the above documentation for accuracy and completeness, and I agree with the above.

## 2023-01-21 ENCOUNTER — Ambulatory Visit (HOSPITAL_COMMUNITY)
Admission: RE | Admit: 2023-01-21 | Discharge: 2023-01-21 | Disposition: A | Discharge status: Home / Self Care | Payer: 59 | Source: Ambulatory Visit | Attending: Adult Health | Admitting: Adult Health

## 2023-01-21 ENCOUNTER — Telehealth: Payer: Self-pay | Admitting: Hematology and Oncology

## 2023-01-21 ENCOUNTER — Inpatient Hospital Stay: Payer: 59 | Attending: Adult Health | Admitting: Hematology and Oncology

## 2023-01-21 DIAGNOSIS — Z9889 Other specified postprocedural states: Secondary | ICD-10-CM

## 2023-01-21 DIAGNOSIS — Z170 Estrogen receptor positive status [ER+]: Secondary | ICD-10-CM

## 2023-01-21 DIAGNOSIS — C50412 Malignant neoplasm of upper-outer quadrant of left female breast: Secondary | ICD-10-CM | POA: Diagnosis not present

## 2023-01-21 DIAGNOSIS — D0511 Intraductal carcinoma in situ of right breast: Secondary | ICD-10-CM | POA: Insufficient documentation

## 2023-01-21 MED ORDER — TAMOXIFEN CITRATE 10 MG PO TABS: 10.0000 mg | ORAL_TABLET | Freq: Every day | ORAL | 3 refills | Status: DC

## 2023-01-21 NOTE — Telephone Encounter (Signed)
Rescheduled appointment per 4/18 los. Patient is aware of the changes made to her upcoming appointment.

## 2023-01-21 NOTE — Assessment & Plan Note (Signed)
Bilateral lumpectomies 03/04/2022 Right lumpectomy: Intermediate grade to high-grade DCIS with necrosis and calcifications, right medial margin: Intermediate grade DCIS, right superior margin: Intermediate grade DCIS, final margins negative, right additional inferior margin: DCIS. Left lumpectomy: Grade 1 IDC 1.6 cm with DCIS margins negative; Left second lumpectomy: Grade 2 IDC 1 cm, DCIS, DCIS focally involves lateral margin, 0/3 lymph nodes negative   Treatment plan: 1. Oncotype DX score: 5 (ROR 3%) 2. Bilateral radiation therapies: 05/27/2022-07/13/2022 3.  Antiestrogen therapy to start 07/27/2022    Tamoxifen toxicities:  Breast cancer surveillance: Breast exam 01/21/2023: Benign Mammogram and ultrasound being performed today  Return to clinic in 1 year for follow-up

## 2023-01-22 ENCOUNTER — Other Ambulatory Visit (HOSPITAL_COMMUNITY): Payer: Self-pay | Admitting: Adult Health

## 2023-01-22 DIAGNOSIS — R928 Other abnormal and inconclusive findings on diagnostic imaging of breast: Secondary | ICD-10-CM

## 2023-01-22 DIAGNOSIS — R921 Mammographic calcification found on diagnostic imaging of breast: Secondary | ICD-10-CM

## 2023-01-22 DIAGNOSIS — Z853 Personal history of malignant neoplasm of breast: Secondary | ICD-10-CM

## 2023-02-05 ENCOUNTER — Ambulatory Visit
Admission: RE | Admit: 2023-02-05 | Discharge: 2023-02-05 | Disposition: A | Discharge status: Home / Self Care | Payer: 59 | Source: Ambulatory Visit | Attending: Adult Health | Admitting: Adult Health

## 2023-02-05 DIAGNOSIS — Z853 Personal history of malignant neoplasm of breast: Secondary | ICD-10-CM

## 2023-02-05 DIAGNOSIS — R928 Other abnormal and inconclusive findings on diagnostic imaging of breast: Secondary | ICD-10-CM

## 2023-02-05 DIAGNOSIS — R921 Mammographic calcification found on diagnostic imaging of breast: Secondary | ICD-10-CM

## 2023-02-05 HISTORY — PX: BREAST BIOPSY: SHX20

## 2023-02-08 ENCOUNTER — Other Ambulatory Visit: Payer: Self-pay | Admitting: Adult Health

## 2023-02-08 DIAGNOSIS — R921 Mammographic calcification found on diagnostic imaging of breast: Secondary | ICD-10-CM

## 2023-02-12 ENCOUNTER — Other Ambulatory Visit: Payer: Self-pay | Admitting: Adult Health

## 2023-02-12 ENCOUNTER — Ambulatory Visit
Admission: RE | Admit: 2023-02-12 | Discharge: 2023-02-12 | Disposition: A | Discharge status: Home / Self Care | Payer: 59 | Source: Ambulatory Visit | Attending: Adult Health | Admitting: Adult Health

## 2023-02-12 DIAGNOSIS — R921 Mammographic calcification found on diagnostic imaging of breast: Secondary | ICD-10-CM

## 2023-02-12 HISTORY — PX: BREAST BIOPSY: SHX20

## 2023-02-15 ENCOUNTER — Telehealth: Payer: Self-pay | Admitting: Hematology and Oncology

## 2023-02-15 ENCOUNTER — Other Ambulatory Visit: Payer: Self-pay | Admitting: Adult Health

## 2023-02-15 DIAGNOSIS — Z170 Estrogen receptor positive status [ER+]: Secondary | ICD-10-CM

## 2023-02-15 DIAGNOSIS — R9230 Dense breasts, unspecified: Secondary | ICD-10-CM

## 2023-02-15 DIAGNOSIS — C50412 Malignant neoplasm of upper-outer quadrant of left female breast: Secondary | ICD-10-CM

## 2023-02-15 DIAGNOSIS — C50911 Malignant neoplasm of unspecified site of right female breast: Secondary | ICD-10-CM

## 2023-02-15 NOTE — Progress Notes (Signed)
Patient with recurrent DCIS and breast density category C.  MRI ordered to establish extent of disease.    Lillard Anes, NP 02/15/23 2:27 PM Medical Oncology and Hematology [facility] [address] [place], [place] [address] Tel. [phone]    Fax. [phone]

## 2023-02-25 NOTE — Progress Notes (Signed)
Patient Care Team: Benita Stabile, MD as PCP - General (Internal Medicine) Deterding, Fayrene Fearing, MD as Consulting Physician (Nephrology) Serena Croissant, MD as Consulting Physician (Hematology and Oncology) Antony Blackbird, MD as Consulting Physician (Radiation Oncology) Emelia Loron, MD as Consulting Physician (General Surgery)  DIAGNOSIS: No diagnosis found.  SUMMARY OF ONCOLOGIC HISTORY: Oncology History  Malignant neoplasm of upper-outer quadrant of left breast in female, estrogen receptor positive (HCC)  01/22/2022 Initial Diagnosis   Work-up performed for intermittent bloody left nipple discharge.  Mammogram and ultrasound revealed bilateral breast masses  Right breast 2 hypoechoic masses 1.6 cm and 0.6 cm.  No axillary lymph nodes.  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 Left lumpectomy: Grade 1 IDC 1.6 cm with DCIS margins negative; Left second lumpectomy: Grade 2 IDC 1 cm, DCIS, DCIS focally involves lateral margin, 0/3 lymph nodes negative   03/04/2022 Cancer Staging   Staging form: Breast, AJCC 8th Edition - Pathologic stage from 03/04/2022: Stage IA (pT1c, pN0, cM0, G2, ER+, PR+, HER2-) - Signed by Loa Socks, NP on 11/10/2022 Histologic grading system: 3 grade system   05/26/2022 - 07/13/2022 Radiation Therapy   Site Technique Total Dose (Gy) Dose per Fx (Gy) Completed Fx Beam Energies  Breast, Right: Breast_R 3D 50.4/50.4 1.8 28/28 10X  Breast, Right: Breast_R_Bst specialPort 12/12 2 6/6 12E, 15E  Breast, Left: Breast_L 3D 50.4/50.4 1.8 28/28 10X  Breast,  Left: Breast_L_Bst 3D 12/12 2 6/6 6X, 10X     07/2022 -  Anti-estrogen oral therapy   Tamoxifen   Ductal carcinoma in situ (DCIS) of right breast  02/09/2022 Initial Diagnosis   Ductal carcinoma in situ (DCIS) of right breast   03/04/2022 Cancer Staging   Staging form: Breast, AJCC 8th Edition - Pathologic stage from 03/04/2022: Stage 0 (pTis (DCIS), pN0, cM0, ER+, PR+) - Signed by Loa Socks, NP on 11/10/2022 Nuclear grade: G3     CHIEF COMPLIANT: Follow-up tamoxifen  INTERVAL HISTORY: Raven Walker is a 42 y.o. female is here because of recent diagnosis of bilateral breast cancers. She presents to the clinic today for a follow-up.     ALLERGIES:  is allergic to nickel.  MEDICATIONS:  Current Outpatient Medications  Medication Sig Dispense Refill   acetaminophen (TYLENOL) 500 MG tablet Take 500-1,000 mg by mouth every 6 (six) hours as needed for moderate pain.     calcitRIOL (ROCALTROL) 0.25 MCG capsule Take 0.5 mcg by mouth daily.     calcium carbonate (TUMS - DOSED IN MG ELEMENTAL CALCIUM) 500 MG chewable tablet Chew 1 tablet by mouth 2 (two) times daily.     diltiazem (CARDIZEM) 30 MG tablet Take 30 mg by mouth 2 (two) times daily.     furosemide (LASIX) 40 MG tablet Take 40 mg by mouth 2 (two) times daily as needed for edema.     Iron-FA-B Cmp-C-Biot-Probiotic (FUSION PLUS) CAPS Take 1 capsule by mouth daily.     magnesium oxide (MAG-OX) 400 (240 Mg) MG tablet Take 1 tablet by mouth 2 (two) times daily.     metoprolol tartrate (LOPRESSOR) 50 MG tablet Take 50 mg by mouth 2 (  two) times daily.     Multiple Vitamins-Minerals (HAIR/SKIN/NAILS) TABS Take 2 tablets by mouth daily.     mycophenolate (CELLCEPT) 250 MG capsule Take 750 mg by mouth 2 (two) times daily.      pantoprazole (PROTONIX) 40 MG tablet Take 40 mg by mouth daily as needed (acid reflux).     tacrolimus (PROGRAF) 1 MG capsule Take 3 capsules (3 mg total) by mouth 2 (two) times daily.      tamoxifen (NOLVADEX) 10 MG tablet Take 1 tablet (10 mg total) by mouth daily. 90 tablet 3   No current facility-administered medications for this visit.    PHYSICAL EXAMINATION: ECOG PERFORMANCE STATUS: {CHL ONC ECOG [phone]}  There were no vitals filed for this visit. There were no vitals filed for this visit.  BREAST:*** No palpable masses or nodules in either right or left breasts. No palpable axillary supraclavicular or infraclavicular adenopathy no breast tenderness or nipple discharge. (exam performed in the presence of a chaperone)  LABORATORY DATA:  I have reviewed the data as listed    Latest Ref Rng & Units 04/09/2022    3:23 PM 02/20/2022    3:20 PM 05/30/2015    9:25 AM  CMP  Glucose 70 - 99 mg/dL 99  161  99   BUN 6 - 20 mg/dL 17  21  16    Creatinine 0.44 - 1.00 mg/dL 0.96  0.45  4.09   Sodium 135 - 145 mmol/L 142  143  139   Potassium 3.5 - 5.1 mmol/L 3.9  3.0  3.7   Chloride 98 - 111 mmol/L 101  107  105   CO2 22 - 32 mmol/L 27  24  25    Calcium 8.9 - 10.3 mg/dL 9.5  8.9  8.1     Lab Results  Component Value Date   WBC 5.6 02/20/2022   HGB 10.7 (L) 02/20/2022   HCT 32.8 (L) 02/20/2022   MCV 82.6 02/20/2022   PLT 227 02/20/2022   NEUTROABS 9.2 (H) 08/30/2013    ASSESSMENT & PLAN:  No problem-specific Assessment & Plan notes found for this encounter.    No orders of the defined types were placed in this encounter.  The patient has a good understanding of the overall plan. she agrees with it. she will call with any problems that may develop before the next visit here. Total time spent: 30 mins including face to face time and time spent for planning, charting and co-ordination of care   Sherlyn Lick, CMA 02/25/23    I Janan Ridge am acting as a Neurosurgeon for [employer]  ***

## 2023-02-26 ENCOUNTER — Other Ambulatory Visit: Payer: 59

## 2023-03-02 ENCOUNTER — Inpatient Hospital Stay: Payer: 59 | Attending: Adult Health | Admitting: Hematology and Oncology

## 2023-03-02 VITALS — BP 132/92 | HR 71 | Temp 97.7°F | Resp 18 | Ht 69.0 in | Wt 221.9 lb

## 2023-03-02 DIAGNOSIS — C50412 Malignant neoplasm of upper-outer quadrant of left female breast: Secondary | ICD-10-CM | POA: Insufficient documentation

## 2023-03-02 DIAGNOSIS — Z79899 Other long term (current) drug therapy: Secondary | ICD-10-CM | POA: Diagnosis not present

## 2023-03-02 DIAGNOSIS — Z170 Estrogen receptor positive status [ER+]: Secondary | ICD-10-CM | POA: Diagnosis not present

## 2023-03-02 NOTE — Assessment & Plan Note (Signed)
Bilateral lumpectomies 03/04/2022 Right lumpectomy: Intermediate grade to high-grade DCIS with necrosis and calcifications, right medial margin: Intermediate grade DCIS, right superior margin: Intermediate grade DCIS, final margins negative, right additional inferior margin: DCIS. Left lumpectomy: Grade 1 IDC 1.6 cm with DCIS margins negative; Left second lumpectomy: Grade 2 IDC 1 cm, DCIS, DCIS focally involves lateral margin, 0/3 lymph nodes negative   Treatment plan: 1. Oncotype DX score: 5 (ROR 3%) 2. Bilateral radiation therapies: 05/27/2022-07/13/2022 3.  Antiestrogen therapy started 07/27/2022    Tamoxifen toxicities: Cycles returned Hair thinning Slight mental clouding We discussed and reduce the dosage of tamoxifen to 10 mg a day   Breast cancer surveillance: Breast exam 01/21/2023: Benign Mammogram 01/21/2023: Indeterminate diffuse pleomorphic and coarse heterogeneous calcifications right breast spanning 9.6 cm Right breast biopsy 02/12/2023: Posterior: High-grade DCIS focal microinvasion cannot be excluded, right breast biopsy anterior: High-grade DCIS ER 100%, PR 80%  Dr. Dwain Sarna saw the patient and recommended mastectomy with immediate reconstruction.   Return to clinic after surgery to discuss pathology report

## 2023-03-12 ENCOUNTER — Ambulatory Visit: Payer: 59 | Admitting: Hematology and Oncology

## 2023-03-19 ENCOUNTER — Other Ambulatory Visit: Payer: Self-pay | Admitting: General Surgery

## 2023-05-03 ENCOUNTER — Telehealth: Payer: Self-pay | Admitting: Hematology and Oncology

## 2023-05-03 ENCOUNTER — Encounter: Payer: Self-pay | Admitting: *Deleted

## 2023-05-03 NOTE — Telephone Encounter (Signed)
Left patient a message regarding upcoming follow up appointment, left callback number if patient needed to reschedule appointment for later date

## 2023-05-07 NOTE — Pre-Procedure Instructions (Signed)
Surgical Instructions   Your procedure is scheduled on May 19, 2023. Report to [facility] Main Entrance "A" at 8:00 A.M., then check in with the Admitting office. Any questions or running late day of surgery: call [phone]  Questions prior to your surgery date: call [phone], Monday-Friday, 8am-4pm. If you experience any cold or flu symptoms such as cough, fever, chills, shortness of breath, etc. between now and your scheduled surgery, please notify us at the above number.     Remember:  Do not eat after midnight the night before your surgery   You may drink clear liquids until 7:00 AM the morning of your surgery.   Clear liquids allowed are: Water, Non-Citrus Juices (without pulp), Carbonated Beverages, Clear Tea, Black Coffee Only (NO MILK, CREAM OR POWDERED CREAMER of any kind), and Gatorade.  Patient Instructions  The night before surgery:  No food after midnight. ONLY clear liquids after midnight  The day of surgery (if you do NOT have diabetes):  Drink ONE (1) Pre-Surgery Clear Ensure by 7:00 AM the morning of surgery. Drink in one sitting. Do not sip.  This drink was given to you during your hospital  pre-op appointment visit.  Nothing else to drink after completing the  Pre-Surgery Clear Ensure.         If you have questions, please contact your surgeon's office.     Take these medicines the morning of surgery with A SIP OF WATER: diltiazem (CARDIZEM)  metoprolol tartrate (LOPRESSOR)  mycophenolate (CELLCEPT)  predniSONE (DELTASONE)  tacrolimus (PROGRAF)  tamoxifen (NOLVADEX)    May take these medicines IF NEEDED: acetaminophen (TYLENOL)  pantoprazole (PROTONIX)    One week prior to surgery, STOP taking any Aspirin (unless otherwise instructed by your surgeon) Aleve, Naproxen, Ibuprofen, Motrin, Advil, Goody's, BC's, all herbal medications, fish oil, and non-prescription vitamins.                     Do NOT Smoke (Tobacco/Vaping) for 24 hours  prior to your procedure.  If you use a CPAP at night, you may bring your mask/headgear for your overnight stay.   You will be asked to remove any contacts, glasses, piercing's, hearing aid's, dentures/partials prior to surgery. Please bring cases for these items if needed.    Patients discharged the day of surgery will not be allowed to drive home, and someone needs to stay with them for 24 hours.  SURGICAL WAITING ROOM VISITATION Patients may have no more than 2 support people in the waiting area - these visitors may rotate.   Pre-op nurse will coordinate an appropriate time for 1 ADULT support person, who may not rotate, to accompany patient in pre-op.  Children under the age of 88 must have an adult with them who is not the patient and must remain in the main waiting area with an adult.  If the patient needs to stay at the hospital during part of their recovery, the visitor guidelines for inpatient rooms apply.  Please refer to the [facility] website for the visitor guidelines for any additional information.   If you received a COVID test during your pre-op visit  it is requested that you wear a mask when out in public, stay away from anyone that may not be feeling well and notify your surgeon if you develop symptoms. If you have been in contact with anyone that has tested positive in the last 10 days please notify you surgeon.      Pre-operative CHG Bathing  Instructions   You can play a key role in reducing the risk of infection after surgery. Your skin needs to be as free of germs as possible. You can reduce the number of germs on your skin by washing with CHG (chlorhexidine gluconate) soap before surgery. CHG is an antiseptic soap that kills germs and continues to kill germs even after washing.   DO NOT use if you have an allergy to chlorhexidine/CHG or antibacterial soaps. If your skin becomes reddened or irritated, stop using the CHG and notify one of our RNs at [phone].               TAKE A SHOWER THE NIGHT BEFORE SURGERY AND THE DAY OF SURGERY    Please keep in mind the following:  DO NOT shave, including legs and underarms, 48 hours prior to surgery.   You may shave your face before/day of surgery.  Place clean sheets on your bed the night before surgery Use a clean washcloth (not used since being washed) for each shower. DO NOT sleep with pet's night before surgery.  CHG Shower Instructions:  If you choose to wash your hair and private area, wash first with your normal shampoo/soap.  After you use shampoo/soap, rinse your hair and body thoroughly to remove shampoo/soap residue.  Turn the water OFF and apply half the bottle of CHG soap to a CLEAN washcloth.  Apply CHG soap ONLY FROM YOUR NECK DOWN TO YOUR TOES (washing for 3-5 minutes)  DO NOT use CHG soap on face, private areas, open wounds, or sores.  Pay special attention to the area where your surgery is being performed.  If you are having back surgery, having someone wash your back for you may be helpful. Wait 2 minutes after CHG soap is applied, then you may rinse off the CHG soap.  Pat dry with a clean towel  Put on clean pajamas    Additional instructions for the day of surgery: DO NOT APPLY any lotions, deodorants, cologne, or perfumes.   Do not wear jewelry or makeup Do not wear nail polish, gel polish, artificial nails, or any other type of covering on natural nails (fingers and toes) Do not bring valuables to the hospital. [facility] is not responsible for valuables/personal belongings. Put on clean/comfortable clothes.  Please brush your teeth.  Ask your nurse before applying any prescription medications to the skin.

## 2023-05-10 ENCOUNTER — Encounter (HOSPITAL_COMMUNITY)
Admission: RE | Admit: 2023-05-10 | Discharge: 2023-05-10 | Disposition: A | Discharge status: Home / Self Care | Payer: 59 | Source: Ambulatory Visit | Attending: General Surgery | Admitting: General Surgery

## 2023-05-10 ENCOUNTER — Other Ambulatory Visit: Payer: Self-pay

## 2023-05-10 ENCOUNTER — Encounter (HOSPITAL_COMMUNITY): Payer: Self-pay

## 2023-05-10 VITALS — BP 142/90 | HR 78 | Temp 98.5°F | Resp 18 | Ht 69.0 in | Wt 221.1 lb

## 2023-05-10 DIAGNOSIS — Z01818 Encounter for other preprocedural examination: Secondary | ICD-10-CM

## 2023-05-10 DIAGNOSIS — Z01812 Encounter for preprocedural laboratory examination: Secondary | ICD-10-CM | POA: Insufficient documentation

## 2023-05-10 DIAGNOSIS — I2510 Atherosclerotic heart disease of native coronary artery without angina pectoris: Secondary | ICD-10-CM | POA: Diagnosis not present

## 2023-05-10 DIAGNOSIS — Z01810 Encounter for preprocedural cardiovascular examination: Secondary | ICD-10-CM | POA: Insufficient documentation

## 2023-05-10 LAB — BASIC METABOLIC PANEL
Anion gap: 13 (ref 5–15)
BUN: 15 mg/dL (ref 6–20)
CO2: 21 mmol/L — ABNORMAL LOW (ref 22–32)
Calcium: 7.2 mg/dL — ABNORMAL LOW (ref 8.9–10.3)
Chloride: 108 mmol/L (ref 98–111)
Creatinine, Ser: 1.1 mg/dL — ABNORMAL HIGH (ref 0.44–1.00)
GFR, Estimated: >60 mL/min (ref >60)
Glucose, Bld: 104 mg/dL — ABNORMAL HIGH (ref 70–99)
Potassium: 3 mmol/L — ABNORMAL LOW (ref 3.5–5.1)
Sodium: 142 mmol/L (ref 135–145)

## 2023-05-10 LAB — CBC
HCT: 32.7 % — ABNORMAL LOW (ref 36.0–46.0)
Hemoglobin: 10.5 g/dL — ABNORMAL LOW (ref 12.0–15.0)
MCH: 26.2 pg (ref 26.0–34.0)
MCHC: 32.1 g/dL (ref 30.0–36.0)
MCV: 81.5 fL (ref 80.0–100.0)
Platelets: 237 10*3/uL (ref 150–400)
RBC: 4.01 MIL/uL (ref 3.87–5.11)
RDW: 13.2 % (ref 11.5–15.5)
WBC: 3.7 10*3/uL — ABNORMAL LOW (ref 4.0–10.5)
nRBC: 0 % (ref 0.0–0.2)

## 2023-05-10 NOTE — Progress Notes (Signed)
PCP - Dr. Kathleene Hazel. Margo Aye Cardiologist - Denies Nephrologist - Dr. Estill Bakes with [facility]  PPM/ICD - Denies Device Orders - n/a Rep Notified - n/a  Chest x-ray - Denies EKG - 05/10/2023 Stress Test - Denies ECHO - 09/06/2009 Cardiac Cath - Denies  Sleep Study - Denies CPAP - n/a  No DM  Last dose of GLP1 agonist- n/a GLP1 instructions: n/a  Blood Thinner Instructions: n/a Aspirin Instructions: n/a  ERAS Protcol - Clear liquids until 0700 morning of surgery PRE-SURGERY Ensure or G2- Ensure given to pt with instructions  COVID TEST- n/a   Anesthesia review: Yes. Pt born with one kidney. 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 Kidney transplant in 2013. Pts BP 157/103 on arrival. At the end of visit BP 142/90 manually. Pt did take her BP meds today. Normal BP at home is 120-130s/90s. Discussed with Antionette Poles, PA-C.  Patient denies shortness of breath, fever, cough and chest pain at PAT appointment. Pt denies any respiratory illness/infection in the last two months.   All instructions explained to the patient, with a verbal understanding of the material. Patient agrees to go over the instructions while at home for a better understanding. Patient also instructed to self quarantine after being tested for COVID-19. The opportunity to ask questions was provided.

## 2023-05-10 NOTE — Progress Notes (Signed)
Called surgeon office for surgical orders. Front desk said surgeon is not back in the office until Monday, August 12th. RN asked if he could please place orders when he gets back to office.

## 2023-05-11 ENCOUNTER — Telehealth: Payer: Self-pay

## 2023-05-11 ENCOUNTER — Ambulatory Visit: Payer: 59 | Admitting: Hematology and Oncology

## 2023-05-11 NOTE — Anesthesia Preprocedure Evaluation (Addendum)
Anesthesia Evaluation  Patient identified by MRN, date of birth, ID band Patient awake    Reviewed: Allergy & Precautions, NPO status , Patient's Chart, lab work & pertinent test results, reviewed documented beta blocker date and time   Airway Mallampati: II  TM Distance: >3 FB Neck ROM: Full    Dental no notable dental hx. (+) Teeth Intact, Dental Advisory Given   Pulmonary former smoker   Pulmonary exam normal breath sounds clear to auscultation       Cardiovascular hypertension, Pt. on home beta blockers and Pt. on medications Normal cardiovascular exam Rhythm:Regular Rate:Normal     Neuro/Psych negative neurological ROS  negative psych ROS   GI/Hepatic Neg liver ROS,GERD  ,,  Endo/Other  negative endocrine ROS    Renal/GU Renal diseaseS/p kidney transplant 2013  Lab Results      Component                Value               Date                      NA                       142                 05/10/2023                CL                       108                 05/10/2023                K                        3.0 (L)             05/10/2023                CO2                      21 (L)              05/10/2023                BUN                      15                  05/10/2023                CREATININE               1.10 (H)            05/10/2023                GFRNONAA                 >60                 05/10/2023                CALCIUM                  7.2 (L)  05/10/2023                PHOS                     4.1                 11/22/2013                ALBUMIN                  2.9 (L)             11/22/2013                GLUCOSE                  104 (H)             05/10/2023             negative genitourinary   Musculoskeletal negative musculoskeletal ROS (+)    Abdominal   Peds  Hematology negative hematology ROS (+)   Anesthesia Other Findings   Reproductive/Obstetrics                              Anesthesia Physical Anesthesia Plan  ASA: 3  Anesthesia Plan: General and Regional   Post-op Pain Management: Regional block* and Tylenol PO (pre-op)*   Induction: Intravenous  PONV Risk Score and Plan: 3 and Midazolam, Dexamethasone and Ondansetron  Airway Management Planned: Oral ETT  Additional Equipment:   Intra-op Plan:   Post-operative Plan: Extubation in OR  Informed Consent: I have reviewed the patients History and Physical, chart, labs and discussed the procedure including the risks, benefits and alternatives for the proposed anesthesia with the patient or authorized representative who has indicated his/her understanding and acceptance.     Dental advisory given  Plan Discussed with: CRNA  Anesthesia Plan Comments: (  )        Anesthesia Quick Evaluation

## 2023-05-11 NOTE — Telephone Encounter (Signed)
Pt called to ask for clarification on holding tamoxifen. Per MD note, she will stop Tamoxifen 1 week before surgery and will not resume for 3-4 weeks post surgery She will have an MD f/u 8/28 to review final path, and MD will give her direction at that appt. She verbalized thanks and understanding.

## 2023-05-11 NOTE — Progress Notes (Signed)
Anesthesia Chart Review:  42 y.o. 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  Last follow-up with transplant medicine at [facility] on 03/29/2023, stable at that time, continued on immunosuppression with tacrolimus 3 mg twice daily, CellCept 750 mg twice daily, prednisone 5 mg daily.  Stable graft function with baseline creatinine ~1.3.  Follows with oncology for history of bilateral breast cancer.  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  Mastectomy recommended.  Preop labs reviewed, mild hypokalemia potassium 3.0, creatinine 1.10, mild anemia hemoglobin 10.5.  EKG 05/10/2023: NSR.  Rate 69.   Zannie Cove [facility]/Anesthesiology Phone [phone] 05/11/2023 1:11 PM

## 2023-05-19 ENCOUNTER — Ambulatory Visit (HOSPITAL_BASED_OUTPATIENT_CLINIC_OR_DEPARTMENT_OTHER): Payer: 59 | Admitting: Anesthesiology

## 2023-05-19 ENCOUNTER — Observation Stay (HOSPITAL_COMMUNITY)
Admission: RE | Admit: 2023-05-19 | Discharge: 2023-05-20 | Disposition: A | Discharge status: Home / Self Care | Payer: 59 | Attending: General Surgery | Admitting: General Surgery

## 2023-05-19 ENCOUNTER — Encounter (HOSPITAL_COMMUNITY): Payer: Self-pay | Admitting: General Surgery

## 2023-05-19 ENCOUNTER — Ambulatory Visit (HOSPITAL_COMMUNITY): Payer: 59 | Admitting: Physician Assistant

## 2023-05-19 ENCOUNTER — Other Ambulatory Visit: Payer: Self-pay

## 2023-05-19 ENCOUNTER — Encounter (HOSPITAL_COMMUNITY)
Admission: RE | Disposition: A | Discharge status: Home / Self Care | Payer: Self-pay | Source: Home / Self Care | Attending: General Surgery

## 2023-05-19 DIAGNOSIS — Z9011 Acquired absence of right breast and nipple: Principal | ICD-10-CM

## 2023-05-19 DIAGNOSIS — K219 Gastro-esophageal reflux disease without esophagitis: Secondary | ICD-10-CM | POA: Insufficient documentation

## 2023-05-19 DIAGNOSIS — N189 Chronic kidney disease, unspecified: Secondary | ICD-10-CM

## 2023-05-19 DIAGNOSIS — Z79810 Long term (current) use of selective estrogen receptor modulators (SERMs): Secondary | ICD-10-CM | POA: Diagnosis not present

## 2023-05-19 DIAGNOSIS — Z923 Personal history of irradiation: Secondary | ICD-10-CM | POA: Diagnosis not present

## 2023-05-19 DIAGNOSIS — Z87891 Personal history of nicotine dependence: Secondary | ICD-10-CM | POA: Diagnosis not present

## 2023-05-19 DIAGNOSIS — I129 Hypertensive chronic kidney disease with stage 1 through stage 4 chronic kidney disease, or unspecified chronic kidney disease: Secondary | ICD-10-CM | POA: Diagnosis not present

## 2023-05-19 DIAGNOSIS — N186 End stage renal disease: Secondary | ICD-10-CM | POA: Diagnosis not present

## 2023-05-19 DIAGNOSIS — D0511 Intraductal carcinoma in situ of right breast: Secondary | ICD-10-CM

## 2023-05-19 DIAGNOSIS — Z01818 Encounter for other preprocedural examination: Secondary | ICD-10-CM

## 2023-05-19 DIAGNOSIS — Z940 Kidney transplant status: Secondary | ICD-10-CM | POA: Insufficient documentation

## 2023-05-19 DIAGNOSIS — I120 Hypertensive chronic kidney disease with stage 5 chronic kidney disease or end stage renal disease: Secondary | ICD-10-CM | POA: Diagnosis not present

## 2023-05-19 HISTORY — PX: BREAST RECONSTRUCTION WITH PLACEMENT OF TISSUE EXPANDER AND FLEX HD (ACELLULAR HYDRATED DERMIS): SHX6295

## 2023-05-19 HISTORY — PX: SIMPLE MASTECTOMY WITH AXILLARY SENTINEL NODE BIOPSY: SHX6098

## 2023-05-19 LAB — POCT PREGNANCY, URINE: Preg Test, Ur: NEGATIVE

## 2023-05-19 SURGERY — SIMPLE MASTECTOMY
Anesthesia: Regional | Site: Breast | Laterality: Right

## 2023-05-19 MED ORDER — FENTANYL CITRATE (PF) 250 MCG/5ML IJ SOLN
INTRAMUSCULAR | Status: DC | PRN
  Administered 2023-05-19 (×3): 50 ug via INTRAVENOUS

## 2023-05-19 MED ORDER — TRANEXAMIC ACID 1000 MG/10ML IV SOLN
2000.0000 mg | Freq: Once | INTRAVENOUS | Status: AC
  Administered 2023-05-19: 2000 mg via TOPICAL
  Filled 2023-05-19: qty 20

## 2023-05-19 MED ORDER — CALCITRIOL 0.25 MCG PO CAPS
0.2500 ug | ORAL_CAPSULE | Freq: Two times a day (BID) | ORAL | Status: DC
  Administered 2023-05-19 – 2023-05-20 (×2): 0.25 ug via ORAL
  Filled 2023-05-19 (×3): qty 1

## 2023-05-19 MED ORDER — METHOCARBAMOL 1000 MG/10ML IJ SOLN: 500.0000 mg | Freq: Three times a day (TID) | INTRAVENOUS | Status: DC | PRN

## 2023-05-19 MED ORDER — 0.9 % SODIUM CHLORIDE (POUR BTL) OPTIME
TOPICAL | Status: DC | PRN
  Administered 2023-05-19: 1000 mL

## 2023-05-19 MED ORDER — LACTATED RINGERS IV SOLN: INTRAVENOUS | Status: DC

## 2023-05-19 MED ORDER — FENTANYL CITRATE (PF) 100 MCG/2ML IJ SOLN
25.0000 ug | INTRAMUSCULAR | Status: DC | PRN
  Administered 2023-05-19 (×2): 50 ug via INTRAVENOUS

## 2023-05-19 MED ORDER — CEFAZOLIN SODIUM-DEXTROSE 2-4 GM/100ML-% IV SOLN
2.0000 g | INTRAVENOUS | Status: AC
  Administered 2023-05-19: 2 g via INTRAVENOUS
  Filled 2023-05-19: qty 100

## 2023-05-19 MED ORDER — PROPOFOL 10 MG/ML IV BOLUS
INTRAVENOUS | Status: DC | PRN
  Administered 2023-05-19: 150 mg via INTRAVENOUS

## 2023-05-19 MED ORDER — INDOCYANINE GREEN 25 MG IV SOLR
INTRAVENOUS | Status: DC | PRN
Start: 2023-05-19 — End: 2023-05-19
  Administered 2023-05-19: 7.5 mg via INTRAVENOUS

## 2023-05-19 MED ORDER — ACETAMINOPHEN 500 MG PO TABS
1000.0000 mg | ORAL_TABLET | Freq: Once | ORAL | Status: DC
  Filled 2023-05-19: qty 2

## 2023-05-19 MED ORDER — ONDANSETRON 4 MG PO TBDP: 4.0000 mg | ORAL_TABLET | Freq: Four times a day (QID) | ORAL | Status: DC | PRN

## 2023-05-19 MED ORDER — PANTOPRAZOLE SODIUM 40 MG PO TBEC: 40.0000 mg | DELAYED_RELEASE_TABLET | Freq: Every day | ORAL | Status: DC | PRN

## 2023-05-19 MED ORDER — LIDOCAINE 2% (20 MG/ML) 5 ML SYRINGE
INTRAMUSCULAR | Status: DC | PRN
  Administered 2023-05-19: 20 mg via INTRAVENOUS

## 2023-05-19 MED ORDER — ONDANSETRON HCL 4 MG/2ML IJ SOLN
INTRAMUSCULAR | Status: DC | PRN
  Administered 2023-05-19 (×2): 4 mg via INTRAVENOUS

## 2023-05-19 MED ORDER — CHLORHEXIDINE GLUCONATE CLOTH 2 % EX PADS: 6.0000 | MEDICATED_PAD | Freq: Once | CUTANEOUS | Status: DC

## 2023-05-19 MED ORDER — MAGNESIUM OXIDE -MG SUPPLEMENT 400 (240 MG) MG PO TABS
400.0000 mg | ORAL_TABLET | Freq: Two times a day (BID) | ORAL | Status: DC
  Administered 2023-05-19 – 2023-05-20 (×2): 400 mg via ORAL
  Filled 2023-05-19 (×2): qty 1

## 2023-05-19 MED ORDER — MIDAZOLAM HCL 2 MG/2ML IJ SOLN
INTRAMUSCULAR | Status: AC
  Administered 2023-05-19: 2 mg via INTRAVENOUS
  Filled 2023-05-19: qty 2

## 2023-05-19 MED ORDER — CALCIUM CARBONATE ANTACID 500 MG PO CHEW
3.0000 | CHEWABLE_TABLET | Freq: Three times a day (TID) | ORAL | Status: DC
  Administered 2023-05-19 – 2023-05-20 (×3): 600 mg via ORAL
  Filled 2023-05-19 (×3): qty 3

## 2023-05-19 MED ORDER — PREDNISONE 5 MG PO TABS
5.0000 mg | ORAL_TABLET | Freq: Every day | ORAL | Status: DC
  Administered 2023-05-20: 5 mg via ORAL
  Filled 2023-05-19 (×2): qty 1

## 2023-05-19 MED ORDER — ACETAMINOPHEN 500 MG PO TABS: 1000.0000 mg | ORAL_TABLET | ORAL | Status: AC

## 2023-05-19 MED ORDER — ROCURONIUM BROMIDE 10 MG/ML (PF) SYRINGE
PREFILLED_SYRINGE | INTRAVENOUS | Status: DC | PRN
  Administered 2023-05-19: 50 mg via INTRAVENOUS

## 2023-05-19 MED ORDER — ONDANSETRON HCL 4 MG/2ML IJ SOLN
INTRAMUSCULAR | Status: AC
  Filled 2023-05-19: qty 4

## 2023-05-19 MED ORDER — MYCOPHENOLATE MOFETIL 250 MG PO CAPS
750.0000 mg | ORAL_CAPSULE | Freq: Two times a day (BID) | ORAL | Status: DC
  Administered 2023-05-20 (×2): 750 mg via ORAL
  Filled 2023-05-19 (×4): qty 3

## 2023-05-19 MED ORDER — FENTANYL CITRATE (PF) 250 MCG/5ML IJ SOLN
INTRAMUSCULAR | Status: AC
  Filled 2023-05-19: qty 5

## 2023-05-19 MED ORDER — BUPIVACAINE-EPINEPHRINE (PF) 0.25% -1:200000 IJ SOLN
INTRAMUSCULAR | Status: AC
  Filled 2023-05-19: qty 30

## 2023-05-19 MED ORDER — FENTANYL CITRATE (PF) 100 MCG/2ML IJ SOLN: 100.0000 ug | Freq: Once | INTRAMUSCULAR | Status: AC

## 2023-05-19 MED ORDER — FENTANYL CITRATE (PF) 100 MCG/2ML IJ SOLN
INTRAMUSCULAR | Status: AC
  Filled 2023-05-19: qty 2

## 2023-05-19 MED ORDER — HAIR/SKIN/NAILS PO TABS: 2.0000 | ORAL_TABLET | Freq: Every day | ORAL | Status: DC

## 2023-05-19 MED ORDER — FENTANYL CITRATE (PF) 100 MCG/2ML IJ SOLN
INTRAMUSCULAR | Status: AC
  Administered 2023-05-19: 100 ug via INTRAVENOUS
  Filled 2023-05-19: qty 2

## 2023-05-19 MED ORDER — SODIUM CHLORIDE 0.9 % IV SOLN: INTRAVENOUS | Status: DC

## 2023-05-19 MED ORDER — MIDAZOLAM HCL 2 MG/2ML IJ SOLN
INTRAMUSCULAR | Status: DC | PRN
  Administered 2023-05-19 (×2): 1 mg via INTRAVENOUS

## 2023-05-19 MED ORDER — ONDANSETRON HCL 4 MG/2ML IJ SOLN: 4.0000 mg | Freq: Four times a day (QID) | INTRAMUSCULAR | Status: DC | PRN

## 2023-05-19 MED ORDER — DEXAMETHASONE SODIUM PHOSPHATE 10 MG/ML IJ SOLN
INTRAMUSCULAR | Status: DC | PRN
  Administered 2023-05-19: 10 mg via INTRAVENOUS

## 2023-05-19 MED ORDER — LIDOCAINE 2% (20 MG/ML) 5 ML SYRINGE
INTRAMUSCULAR | Status: AC
  Filled 2023-05-19: qty 5

## 2023-05-19 MED ORDER — METOPROLOL TARTRATE 50 MG PO TABS
50.0000 mg | ORAL_TABLET | Freq: Two times a day (BID) | ORAL | Status: DC
  Administered 2023-05-19 – 2023-05-20 (×2): 50 mg via ORAL
  Filled 2023-05-19 (×2): qty 1

## 2023-05-19 MED ORDER — ORAL CARE MOUTH RINSE: 15.0000 mL | Freq: Once | OROMUCOSAL | Status: AC

## 2023-05-19 MED ORDER — ENSURE PRE-SURGERY PO LIQD: 296.0000 mL | Freq: Once | ORAL | Status: DC

## 2023-05-19 MED ORDER — SODIUM CHLORIDE 0.9 % IV SOLN: INTRAVENOUS | Status: DC | PRN

## 2023-05-19 MED ORDER — CHLORHEXIDINE GLUCONATE 0.12 % MT SOLN
15.0000 mL | Freq: Once | OROMUCOSAL | Status: AC
  Administered 2023-05-19: 15 mL via OROMUCOSAL
  Filled 2023-05-19: qty 15

## 2023-05-19 MED ORDER — SIMETHICONE 80 MG PO CHEW: 40.0000 mg | CHEWABLE_TABLET | Freq: Four times a day (QID) | ORAL | Status: DC | PRN

## 2023-05-19 MED ORDER — DEXAMETHASONE SODIUM PHOSPHATE 10 MG/ML IJ SOLN
INTRAMUSCULAR | Status: AC
  Filled 2023-05-19: qty 1

## 2023-05-19 MED ORDER — ACETAMINOPHEN 500 MG PO TABS
1000.0000 mg | ORAL_TABLET | Freq: Four times a day (QID) | ORAL | Status: DC
  Administered 2023-05-19 – 2023-05-20 (×4): 1000 mg via ORAL
  Filled 2023-05-19 (×4): qty 2

## 2023-05-19 MED ORDER — PROPOFOL 10 MG/ML IV BOLUS
INTRAVENOUS | Status: AC
  Filled 2023-05-19: qty 20

## 2023-05-19 MED ORDER — MORPHINE SULFATE (PF) 2 MG/ML IV SOLN
2.0000 mg | INTRAVENOUS | Status: DC | PRN
  Administered 2023-05-19: 2 mg via INTRAVENOUS
  Filled 2023-05-19: qty 1

## 2023-05-19 MED ORDER — OXYCODONE HCL 5 MG PO TABS
5.0000 mg | ORAL_TABLET | ORAL | Status: DC | PRN
  Administered 2023-05-19 – 2023-05-20 (×4): 5 mg via ORAL
  Filled 2023-05-19 (×4): qty 1

## 2023-05-19 MED ORDER — ROCURONIUM BROMIDE 10 MG/ML (PF) SYRINGE
PREFILLED_SYRINGE | INTRAVENOUS | Status: AC
  Filled 2023-05-19: qty 10

## 2023-05-19 MED ORDER — MIDAZOLAM HCL 2 MG/2ML IJ SOLN
INTRAMUSCULAR | Status: AC
  Filled 2023-05-19: qty 2

## 2023-05-19 MED ORDER — METHOCARBAMOL 500 MG PO TABS
500.0000 mg | ORAL_TABLET | Freq: Three times a day (TID) | ORAL | Status: DC | PRN
  Administered 2023-05-19: 500 mg via ORAL
  Filled 2023-05-19: qty 1

## 2023-05-19 MED ORDER — DILTIAZEM HCL 30 MG PO TABS
30.0000 mg | ORAL_TABLET | Freq: Two times a day (BID) | ORAL | Status: DC
  Administered 2023-05-19 – 2023-05-20 (×2): 30 mg via ORAL
  Filled 2023-05-19 (×2): qty 1

## 2023-05-19 MED ORDER — TACROLIMUS 1 MG PO CAPS
3.0000 mg | ORAL_CAPSULE | Freq: Two times a day (BID) | ORAL | Status: DC
  Administered 2023-05-19 – 2023-05-20 (×2): 3 mg via ORAL
  Filled 2023-05-19 (×3): qty 3

## 2023-05-19 MED ORDER — MIDAZOLAM HCL 2 MG/2ML IJ SOLN: 2.0000 mg | Freq: Once | INTRAMUSCULAR | Status: AC

## 2023-05-19 MED ORDER — SUGAMMADEX SODIUM 200 MG/2ML IV SOLN
INTRAVENOUS | Status: DC | PRN
  Administered 2023-05-19: 200 mg via INTRAVENOUS

## 2023-05-19 MED ORDER — SODIUM CHLORIDE 0.9 % IV SOLN
Freq: Once | INTRAVENOUS | Status: AC
  Administered 2023-05-19: 1000 mL
  Filled 2023-05-19: qty 2

## 2023-05-19 SURGICAL SUPPLY — 79 items
ADH SKN CLS APL DERMABOND .7 (GAUZE/BANDAGES/DRESSINGS) ×1
APL PRP STRL LF DISP 70% ISPRP (MISCELLANEOUS) ×1
APL SKNCLS STERI-STRIP NONHPOA (GAUZE/BANDAGES/DRESSINGS)
APPLIER CLIP 9.375 MED OPEN (MISCELLANEOUS) ×1
APR CLP MED 9.3 20 MLT OPN (MISCELLANEOUS) ×1
BAG DECANTER FOR FLEXI CONT (MISCELLANEOUS) ×2 IMPLANT
BENZOIN TINCTURE PRP APPL 2/3 (GAUZE/BANDAGES/DRESSINGS) ×4 IMPLANT
BINDER BREAST XXLRG (GAUZE/BANDAGES/DRESSINGS) IMPLANT
BIOPATCH RED 1 DISK 7.0 (GAUZE/BANDAGES/DRESSINGS) ×2 IMPLANT
BLADE SURG 15 STRL LF DISP TIS (BLADE) IMPLANT
BLADE SURG 15 STRL SS (BLADE) ×1
BNDG ELASTIC 6X5.8 VLCR STR LF (GAUZE/BANDAGES/DRESSINGS) ×2 IMPLANT
BNDG GAUZE DERMACEA FLUFF 4 (GAUZE/BANDAGES/DRESSINGS) IMPLANT
BNDG GZE DERMACEA 4 6PLY (GAUZE/BANDAGES/DRESSINGS)
CHLORAPREP W/TINT 26 (MISCELLANEOUS) ×2 IMPLANT
CLIP APPLIE 9.375 MED OPEN (MISCELLANEOUS) ×2 IMPLANT
COVER SURGICAL LIGHT HANDLE (MISCELLANEOUS) ×2 IMPLANT
DERMABOND ADVANCED .7 DNX12 (GAUZE/BANDAGES/DRESSINGS) ×2 IMPLANT
DRAIN CHANNEL 15F RND FF W/TCR (WOUND CARE) ×2 IMPLANT
DRAIN JP 15F RND TROCAR (DRAIN) IMPLANT
DRAIN RELI 100 BL SUC LF ST (DRAIN) ×1
DRAPE ORTHO SPLIT 77X108 STRL (DRAPES) ×1
DRAPE SURG ORHT 6 SPLT 77X108 (DRAPES) ×4 IMPLANT
DRAPE TOP ARMCOVERS (MISCELLANEOUS) ×2 IMPLANT
DRSG TEGADERM 4X10 (GAUZE/BANDAGES/DRESSINGS) IMPLANT
DRSG TEGADERM 4X4.75 (GAUZE/BANDAGES/DRESSINGS) ×2 IMPLANT
ELECT BLADE 4.0 EZ CLEAN MEGAD (MISCELLANEOUS) ×1
ELECT CAUTERY BLADE 6.4 (BLADE) ×2 IMPLANT
ELECT COATED BLADE 2.86 ST (ELECTRODE) IMPLANT
ELECT REM PT RETURN 9FT ADLT (ELECTROSURGICAL) ×1
ELECTRODE BLDE 4.0 EZ CLN MEGD (MISCELLANEOUS) ×2 IMPLANT
ELECTRODE REM PT RTRN 9FT ADLT (ELECTROSURGICAL) ×2 IMPLANT
EVACUATOR SILICONE 100CC (DRAIN) ×4 IMPLANT
GAUZE PAD ABD 8X10 STRL (GAUZE/BANDAGES/DRESSINGS) ×8 IMPLANT
GAUZE SPONGE 4X4 12PLY STRL (GAUZE/BANDAGES/DRESSINGS) IMPLANT
GLOVE BIO SURGEON STRL SZ7 (GLOVE) ×2 IMPLANT
GLOVE BIOGEL M 7.0 STRL (GLOVE) ×2 IMPLANT
GLOVE BIOGEL M STRL SZ7.5 (GLOVE) ×4 IMPLANT
GLOVE BIOGEL PI IND STRL 7.5 (GLOVE) ×2 IMPLANT
GLOVE BIOGEL PI IND STRL 8 (GLOVE) IMPLANT
GOWN STRL REUS W/ TWL LRG LVL3 (GOWN DISPOSABLE) ×10 IMPLANT
GOWN STRL REUS W/TWL LRG LVL3 (GOWN DISPOSABLE) ×5
GRAFT FLEX HD 19X22X0.7-1.4 (Tissue) IMPLANT
IMPL EXPANDER BREAST 535CC (Breast) IMPLANT
IMPLANT EXPANDER BREAST 535CC (Breast) ×1 IMPLANT
KIT BASIN OR (CUSTOM PROCEDURE TRAY) ×4 IMPLANT
KIT FILL ASEPTIC TRANSFER (MISCELLANEOUS) IMPLANT
KIT FILL SYSTEM UNIVERSAL (SET/KITS/TRAYS/PACK) IMPLANT
KIT TURNOVER KIT B (KITS) ×2 IMPLANT
MARKER SKIN DUAL TIP RULER LAB (MISCELLANEOUS) IMPLANT
NDL HYPO 25X1 1.5 SAFETY (NEEDLE) IMPLANT
NEEDLE HYPO 25X1 1.5 SAFETY (NEEDLE)
NS IRRIG 1000ML POUR BTL (IV SOLUTION) ×2 IMPLANT
PACK GENERAL/GYN (CUSTOM PROCEDURE TRAY) ×2 IMPLANT
PACK SPY-PHI (KITS) ×2 IMPLANT
PAD ARMBOARD 7.5X6 YLW CONV (MISCELLANEOUS) ×2 IMPLANT
PIN SAFETY STERILE (MISCELLANEOUS) ×2 IMPLANT
RETRACTOR ONETRAX LX 135X30 (MISCELLANEOUS) IMPLANT
SPIKE FLUID TRANSFER (MISCELLANEOUS) IMPLANT
STAPLER INSORB 30 2030 C-SECTI (MISCELLANEOUS) IMPLANT
STAPLER VISISTAT 35W (STAPLE) IMPLANT
STRIP CLOSURE SKIN 1/2X4 (GAUZE/BANDAGES/DRESSINGS) ×4 IMPLANT
SUT DVC VLOC 180 0 12IN GS21 (SUTURE) ×1
SUT ETHILON 2 0 FS 18 (SUTURE) IMPLANT
SUT ETHILON 3 0 FSL (SUTURE) IMPLANT
SUT PDS AB 3-0 SH 27 (SUTURE) ×12 IMPLANT
SUT PLAIN 5 0 P 3 18 (SUTURE) IMPLANT
SUT SILK 2 0 SH (SUTURE) ×2 IMPLANT
SUT VIC AB 3-0 SH 18 (SUTURE) ×2 IMPLANT
SUT VLOC 90 3-0 CLR P12 (SUTURE) IMPLANT
SUT VLOC 90 P-14 23 (SUTURE) ×4 IMPLANT
SUTURE DVC VLC 180 0 12IN GS21 (SUTURE) IMPLANT
SYR 50ML LL SCALE MARK (SYRINGE) ×4 IMPLANT
SYR BULB IRRIG 60ML STRL (SYRINGE) ×2 IMPLANT
SYR CONTROL 10ML LL (SYRINGE) IMPLANT
TAPE MEASURE VINYL STERILE (MISCELLANEOUS) IMPLANT
TOWEL GREEN STERILE (TOWEL DISPOSABLE) ×2 IMPLANT
TOWEL GREEN STERILE FF (TOWEL DISPOSABLE) ×6 IMPLANT
TUBE CONNECTING 20X1/4 (TUBING) ×2 IMPLANT

## 2023-05-19 NOTE — H&P (Signed)
42 year old female who underwent bilateral lumpectomies. 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 I did take additional medial margin that is focally less than 1 mm. The superior margin is now 4 mm. The inferior margin is focally 1 mm away. This is ER and PR positive. The left side ends up being 2 tumor nodules with a 1.6 grade 1 invasive ductal carcinoma and negative margins as well as a 1 cm invasive ductal carcinoma with negative margins. The DCIS very focally involves and is broadly less than 1 mm from the lateral margin of the side. This is ER and PR positive, HER2 negative. Oncotype was 5. I re-excised left sided margins and they are now clear although close (negative with dcis and idc).  I removed a very significant portion of right breast before looking at size with pathology. She did radiotherapy and is on 10 of tamoxifen now tolerating well. She had follow up mm and has indeterminate pleomorphic and coarse calcs spanning 9.6 cm in the medial and lower inner right breast. Stereo of the dominant 1.2 cm area was then done with results of fat necrosis and calcifications. She then underwent biopsy of two additional areas that are both HG DCIS that is er pos at 100 and pr pos at 80. We discussed options last time. She has since seen plastics and discussed with transplant team  Medical History: Past Medical History:  Diagnosis Date  Abnormal uterine bleeding  Awareness under anesthesia  pt recalls being aware briefly during renal transplant surgery  CKD (chronic kidney disease)  to ESRD  Fibroid  Fibroids 01/06/2018  Hypercholesterolemia  Hyperparathyroidism due to renal insufficiency (CMS/HHS-HCC)  secondary, and anemia  Hypertension  malignant  Other ureteric obstruction 08/11/2012   Patient Active Problem List  Diagnosis  Kidney replaced by transplant (HHS-HCC)  Immunosuppression (CMS/HHS-HCC)  Edema of both legs  Lymphocele  Other ureteric  obstruction  Hypertension  ESRD (end stage renal disease) (CMS/HHS-HCC)  Submucous uterine fibroid  Menorrhagia with regular cycle  Fibroids  Abnormal uterine bleeding (AUB)  Pyelonephritis   Past Surgical History:  Procedure Laterality Date  PARATHYROIDECTOMY 07/2011  s/p Kidney transplant Right 06/19/2012  TRANSPLANT KIDNEY Right 06/19/2012  Procedure: TRANSPLANT KIDNEY; Surgeon: Lucinda Dell, MD; Location: [facility] OPERATING ROOMS; Service: General Surgery; Laterality: Right;  HYSTEROSCOPY W/ENDOMETRIAL ABLATION USING HTA N/A 03/27/2016  Procedure: HYSTEROSCOPY WITH ENDOMETRIAL ABLATION USING HTA; Surgeon: Milinda Pointer, MD; Location: [facility] OR; Service: Gynecology; Laterality: N/A;  Bilateral Bracketed Breast Lumpectomy with Radioactive Seed Localization 03/04/2022  Dr. Darnelle Spangle  DEEP AXILLARY SENTINEL NODE BIOPSY / EXCISION Left  OTHER SURGERY  endometrial ablation    Allergies  Allergen Reactions  Nickel Itching and Rash  Only where touched   Current Outpatient Medications on File Prior to Visit  Medication Sig Dispense Refill  calcitRIOL (ROCALTROL) 0.25 MCG capsule Take 0.25 mcg by mouth 2 (two) times daily.  calcium carbonate 320 mg (750 mg) chewable tablet Take 2 tablets by mouth 3 (three) times daily with meals. Take at 1100, 1400 and 1700  diltiazem (CARDIZEM) 30 MG tablet Take by mouth. Take 1 tab by mouth twice daily  FUROsemide (LASIX) 20 MG tablet Take 40 mg by mouth once daily 30 tablet 11  FUSION PLUS 130 mg iron -1,250 mcg Cap Take 1 capsule by mouth once daily  magnesium oxide (MAG-OX) 400 mg (241.3 mg magnesium) tablet Take 1 tablet by mouth 2 (two) times daily  metoprolol tartrate (LOPRESSOR) 50 MG tablet  Take 50 mg by mouth 2 (two) times daily  mycophenolate (CELLCEPT) 250 mg capsule Take 3 capsules (750 mg total) by mouth 2 (two) times daily 180 capsule 11  pantoprazole (PROTONIX) 40 MG DR tablet TAKE 1 TABLET ONCE DAILY 90 tablet 0   Pharmacy Test Claim TEST CLAIM ONLY 1 tablet 1  predniSONE (DELTASONE) 5 MG tablet Take 5 mg by mouth once daily  tacrolimus (PROGRAF) 1 MG capsule Take 3 capsules (3 mg total) by mouth every 12 (twelve) hours 180 capsule 11  tamoxifen (NOLVADEX) 20 MG tablet Take 10 mg by mouth once daily  UNABLE TO FIND hairfinity    Family History  Problem Relation Age of Onset  High blood pressure (Hypertension) Mother  Obesity Mother  Anesthesia problems Neg Hx    Social History   Tobacco Use  Smoking Status Never  Smokeless Tobacco Never  Marital status: Married  Tobacco Use  Smoking status: Never  Smokeless tobacco: Never  Vaping Use  Vaping status: Never Used  Substance and Sexual Activity  Alcohol use: No  Drug use: No  Social History Narrative  Lives in [place] with fiancee.    Objective:   Physical Exam  Physical Exam Vitals reviewed.  Constitutional:  Appearance: Normal appearance.  Chest:  Breasts: Right: No inverted nipple, mass or nipple discharge.  Left: No inverted nipple, mass or nipple discharge.  Comments: Radiotherapy changes bilaterally Lymphadenopathy:  Upper Body:  Right upper body: No supraclavicular or axillary adenopathy.  Left upper body: No supraclavicular or axillary adenopathy.  Neurological:  Mental Status: She is alert.     Assessment and Plan:   Right mastectomy

## 2023-05-19 NOTE — Op Note (Signed)
Preoperative diagnosis: right breast dcis Postoperative diagnosis: saa Procedure: right mastectomy Surgeon: Dr Harden Mo EBL: minimal Complications none Drains per plastic surgery Specimens: right breast tissue marked short superior, long lateral, double deep Sponge and count was correct completion Case turned over to plastic surgery back completion  Indications: This a 42 year old female who is undergone bilateral lateral lumpectomies.  She has had a prior left-sided invasive cancer as well as right sided DCIS.  She was undergoing follow-up after she received radiation and on tamoxifen.  She had a 9.6 cm area of pleomorphic and coarse calcifications in the medial lower inner right breast.  Biopsy of several areas showed high-grade DCIS.  We discussed her options elected proceed with a mastectomy.  She wanted to proceed with expander placement with plastic surgery at the same time. She has a history of a renal transplant and this was discussed with her transplant team prior to beginning.  Procedure: After informed consent was obtained she was taken to the operating room.  She underwent a pectoral block.  SCDs were in place.  Antibiotics were given.  She was then prepped and draped in the standard sterile surgical fashion after undergoing general anesthesia.  Surgical timeout was then performed.  I made an elliptical incision just encompassing the nipple and areolar complex.  I created flaps to the clavicle, parasternal area, inframammary fold, latissimus laterally.  The breast was removed with some difficulty due to her prior radiation and surgery from the muscle to include the fascia.  I then marked this as above.  I placed a TXA soaked sponge in the cavity after obtaining hemostasis and this was left for 5 minutes.  When I remove this hemostasis was obtained.  The case was then turned over to plastic surgery for reconstruction.

## 2023-05-19 NOTE — Interval H&P Note (Signed)
History and Physical Interval Note:  05/19/2023 9:24 AM  Raven Walker  has presented today for surgery, with the diagnosis of RIGHT DUCTAL CARCINOMA IN SITU.  The various methods of treatment have been discussed with the patient and family. After consideration of risks, benefits and other options for treatment, the patient has consented to  Procedure(s): RIGHT MASTECTOMY (Right) BREAST RECONSTRUCTION WITH PLACEMENT OF TISSUE EXPANDER AND ACELULAR DERMAL MATRIX (Right) as a surgical intervention.  The patient's history has been reviewed, patient examined, no change in status, stable for surgery.  I have reviewed the patient's chart and labs.  Questions were answered to the patient's satisfaction.     Emelia Loron

## 2023-05-19 NOTE — Transfer of Care (Signed)
Immediate Anesthesia Transfer of Care Note  Patient: Raven Walker  Procedure(s) Performed: RIGHT MASTECTOMY (Right: Breast) BREAST RECONSTRUCTION WITH PLACEMENT OF TISSUE EXPANDER AND ACELULAR DERMAL MATRIX (Right: Breast)  Patient Location: PACU  Anesthesia Type:General and Regional  Level of Consciousness: drowsy  Airway & Oxygen Therapy: Patient Spontanous Breathing and Patient connected to face mask  Post-op Assessment: Report given to RN and Post -op Vital signs reviewed and stable  Post vital signs: Reviewed and stable  Last Vitals:  Vitals Value Taken Time  BP 154/88 05/19/23 1223  Temp    Pulse 66 05/19/23 1228  Resp 22 05/19/23 1228  SpO2 95 % 05/19/23 1228  Vitals shown include unfiled device data.  Last Pain:  Vitals:   05/19/23 0843  PainSc: 0-No pain         Complications: No notable events documented.

## 2023-05-19 NOTE — Anesthesia Procedure Notes (Addendum)
Procedure Name: Intubation Date/Time: 05/19/2023 10:07 AM  Performed by: Camillia Herter, CRNAPre-anesthesia Checklist: Patient identified, Emergency Drugs available, Suction available and Patient being monitored Patient Re-evaluated:Patient Re-evaluated prior to induction Oxygen Delivery Method: Circle System Utilized Preoxygenation: Pre-oxygenation with 100% oxygen Induction Type: IV induction Ventilation: Mask ventilation without difficulty Laryngoscope Size: Mac and 3 Grade View: Grade I Tube type: Oral Tube size: 7.0 mm Number of attempts: 1 Airway Equipment and Method: Stylet and Oral airway Placement Confirmation: ETT inserted through vocal cords under direct vision, positive ETCO2 and breath sounds checked- equal and bilateral Secured at: 22 cm Tube secured with: Tape Dental Injury: Teeth and Oropharynx as per pre-operative assessment

## 2023-05-19 NOTE — Op Note (Signed)
Operative Note   DATE OF OPERATION: 05/19/2023  SURGICAL DEPARTMENT: Plastic Surgery  PREOPERATIVE DIAGNOSES:  Right Mastectomy Defect  POSTOPERATIVE DIAGNOSES:  same  PROCEDURE: 1. Right breast reconstruction with tissue expanders and acellular dermal matrix 2. Indocyanine green angiography of right mastectomy flap  SURGEON: Ancil Linsey, MD  ASSISTANT: None  ANESTHESIA:  General.   COMPLICATIONS: None.   INDICATIONS FOR PROCEDURE:  The patient, Raven Walker is a 42 y.o. female born on 1981/06/26, is here for treatment of breast cancer MRN: [id number]  CONSENT:  Informed consent was obtained directly from the patient. Risks, benefits and alternatives were fully discussed. Specific risks including but not limited to bleeding, infection, hematoma, seroma, scarring, pain, contracture, asymmetry, wound healing problems, and need for further surgery were all discussed. The patient did have an ample opportunity to have questions answered to satisfaction.   DESCRIPTION OF PROCEDURE:  The patient was taken to the operating room. SCDs were placed and antibiotics were given. General anesthesia was administered.  The patient's operative site was prepped and draped in a sterile fashion. A time out was performed and all information was confirmed to be correct.  Dr. Dwain Sarna performed right mastectomy and then the patient was turned over to me.  I examined the flaps and there was no issues.  Hemostasis was ensured on both sides with cautery.  SPY angiography was then performed.  There was decreased filling inferior to the incision.  I elected to excise as much of the tissue as possible and performed an elliptical excision around her incision taking much more inferiorly than superiorly.  There was less bleeding than expected after sharply excising this.  Clinically the flaps looked ok so I decided to proceed with the reconstruction.  Pocket was irrigated with triple antibiotic  solution.  15 [place] JP drain was placed on right side coming out laterally and secured with a 3-0 nylon.  I did close down the lateral aspect of the pocket with a 0 V-Loc suture to cut down on the dead space.  I then prepared the implants.  I used a [employer] manager[employer] 595 cc smooth tissue expander.  Serial number on the right is [id number].  A single piece of FlexHD pliable pre was used.  Acellular dermal matrix was soaked in sterile saline for at least 5 minutes.  A pursestring was run around the periphery of the matrix with 3-0 PDS.  The expander was then placed inside and the pursestring was tied down.  Suture tabs were brought out at 3:00, 6:00, and 9:00.  I then preplaced 3-0 PDS sutures at 3:00, 6:00, and 9:00 into the chest wall.  The sutures were then passed through the suture tabs and the implant was placed.  The sutures were tied down to secure them in place.  The expander was then inflated with 100 cc of saline.  Wound was then closed with interrupted buried Ensorb staples and a running 3-0 V- lock.  Wounds are dressed with Steri-Strips 4 x 4's and ABD pads secured with a breast binder.  The patient tolerated the procedure well.  There were no complications. The patient was allowed to wake from anesthesia, extubated and taken to the recovery room in satisfactory condition.

## 2023-05-20 ENCOUNTER — Encounter (HOSPITAL_COMMUNITY): Payer: Self-pay | Admitting: General Surgery

## 2023-05-20 DIAGNOSIS — D0511 Intraductal carcinoma in situ of right breast: Secondary | ICD-10-CM | POA: Diagnosis not present

## 2023-05-20 LAB — URINALYSIS, ROUTINE W REFLEX MICROSCOPIC
Bilirubin Urine: NEGATIVE
Glucose, UA: NEGATIVE mg/dL
Hgb urine dipstick: NEGATIVE
Ketones, ur: NEGATIVE mg/dL
Leukocytes,Ua: NEGATIVE
Nitrite: NEGATIVE
Protein, ur: NEGATIVE mg/dL
Specific Gravity, Urine: 1.02 (ref 1.005–1.030)
pH: 5 (ref 5.0–8.0)

## 2023-05-20 LAB — BASIC METABOLIC PANEL
Anion gap: 15 (ref 5–15)
BUN: 20 mg/dL (ref 6–20)
CO2: 22 mmol/L (ref 22–32)
Calcium: 7.2 mg/dL — ABNORMAL LOW (ref 8.9–10.3)
Chloride: 103 mmol/L (ref 98–111)
Creatinine, Ser: 1.53 mg/dL — ABNORMAL HIGH (ref 0.44–1.00)
GFR, Estimated: 43 mL/min — ABNORMAL LOW (ref >60)
Glucose, Bld: 143 mg/dL — ABNORMAL HIGH (ref 70–99)
Potassium: 4.3 mmol/L (ref 3.5–5.1)
Sodium: 140 mmol/L (ref 135–145)

## 2023-05-20 MED ORDER — SODIUM CHLORIDE 0.9 % IV BOLUS
500.0000 mL | Freq: Once | INTRAVENOUS | Status: AC
  Administered 2023-05-20: 500 mL via INTRAVENOUS

## 2023-05-20 NOTE — Discharge Summary (Signed)
Physician Discharge Summary  Patient ID: Raven Walker MRN: [id number] DOB/AGE: July 13, 1981 42 y.o.  Admit date: 05/19/2023 Discharge date: 05/20/2023  Admission Diagnoses: Right dcis  Discharge Diagnoses:  Principal Problem:   S/P mastectomy, right   Discharged Condition: good  Hospital Course: 61 yof with renal tx who has dcis on right side.  Underwent right mastectomy with expander reconstruction. Doing well following am. Cr up a little from baseline but voiding, UA negative. Will dc today  Consults: None  Significant Diagnostic Studies: none  Treatments: surgery: right mastectomy with expander recon  Discharge Exam: Blood pressure 127/67, pulse 79, temperature 97.9 F (36.6 C), temperature source Oral, resp. rate 17, height 5\' 9"  (1.753 m), weight 97.5 kg, SpO2 98%.   Disposition:      Follow-up Information     Emelia Loron, MD Follow up in 2 week(s).   Specialty: General Surgery Contact information: [address] [place] [address] [phone]                 Signed: Emelia Loron 05/20/2023, 7:09 AM

## 2023-05-20 NOTE — Plan of Care (Signed)
  Problem: Education: °Goal: Knowledge of General Education information will improve °Description: Including pain rating scale, medication(s)/side effects and non-pharmacologic comfort measures °Outcome: Adequate for Discharge °  °Problem: Health Behavior/Discharge Planning: °Goal: Ability to manage health-related needs will improve °Outcome: Adequate for Discharge °  °Problem: Clinical Measurements: °Goal: Ability to maintain clinical measurements within normal limits will improve °Outcome: Adequate for Discharge °Goal: Will remain free from infection °Outcome: Adequate for Discharge °Goal: Diagnostic test results will improve °Outcome: Adequate for Discharge °Goal: Respiratory complications will improve °Outcome: Adequate for Discharge °Goal: Cardiovascular complication will be avoided °Outcome: Adequate for Discharge °  °Problem: Activity: °Goal: Risk for activity intolerance will decrease °Outcome: Adequate for Discharge °  °Problem: Nutrition: °Goal: Adequate nutrition will be maintained °Outcome: Adequate for Discharge °  °Problem: Coping: °Goal: Level of anxiety will decrease °Outcome: Adequate for Discharge °  °Problem: Elimination: °Goal: Will not experience complications related to bowel motility °Outcome: Adequate for Discharge °Goal: Will not experience complications related to urinary retention °Outcome: Adequate for Discharge °  °Problem: Pain Managment: °Goal: General experience of comfort will improve °Outcome: Adequate for Discharge °  °Problem: Safety: °Goal: Ability to remain free from injury will improve °Outcome: Adequate for Discharge °  °Problem: Skin Integrity: °Goal: Risk for impaired skin integrity will decrease °Outcome: Adequate for Discharge °  °Problem: Education: °Goal: Knowledge of disease or condition will improve °Outcome: Adequate for Discharge °  °Problem: Activity: °Goal: Ability to maintain or regain function will improve °Outcome: Adequate for Discharge °  °Problem: Clinical  Measurements: °Goal: Postoperative complications will be avoided or minimized °Outcome: Adequate for Discharge °  °Problem: Self-Concept: °Goal: Ability to verbalize positive feelings about self will improve °Outcome: Adequate for Discharge °  °Problem: Pain Management: °Goal: Expressions of feelings of enhanced comfort will increase °Outcome: Adequate for Discharge °  °Problem: Skin Integrity: °Goal: Demonstration of wound healing without infection will improve °Outcome: Adequate for Discharge °  °

## 2023-05-20 NOTE — Progress Notes (Signed)
1 Day Post-Op   Subjective/Chief Complaint: Sore, otherwise ok, ate, has been up to bathroom, some burning with urination   Objective: Vital signs in last 24 hours: Temp:  [97.9 F (36.6 C)-99.5 F (37.5 C)] 98 F (36.7 C) (08/15 0833) Pulse Rate:  [59-79] 73 (08/15 0833) Resp:  [13-23] 18 (08/15 0833) BP: (127-165)/(55-96) 143/72 (08/15 0833) SpO2:  [92 %-100 %] 98 % (08/15 0833) Last BM Date : 05/20/23  Intake/Output from previous day: 08/14 0701 - 08/15 0700 In: 1156 [I.V.:1156] Out: 520 [Urine:450; Drains:40; Blood:30] Intake/Output this shift: No intake/output data recorded.  Right mastectomy incision intact, expander in place, flaps viable, drain as expected, no hematoma  Lab Results:  No results for input(s): "WBC", "HGB", "HCT", "PLT" in the last 72 hours. BMET No results for input(s): "NA", "K", "CL", "CO2", "GLUCOSE", "BUN", "CREATININE", "CALCIUM" in the last 72 hours. PT/INR No results for input(s): "LABPROT", "INR" in the last 72 hours. ABG No results for input(s): "PHART", "HCO3" in the last 72 hours.  Invalid input(s): "PCO2", "PO2"  Studies/Results: No results found.  Anti-infectives: Anti-infectives (From admission, onward)    Start     Dose/Rate Route Frequency Ordered Stop   05/19/23 1000  gentamicin 80 mg/cefazolin 1000 mg in normal saline 1000 mL irrigation         Irrigation  Once 05/19/23 0953 05/19/23 1109   05/19/23 0815  ceFAZolin (ANCEF) IVPB 2g/100 mL premix        2 g 200 mL/hr over 30 Minutes Intravenous On call to O.R. 05/19/23 0809 05/19/23 1010       Assessment/Plan: POD 1 right mastectomy/expander -will check Cr due to renal tx -check ua due to symptoms -possibly home later today  Emelia Loron 05/20/2023

## 2023-05-20 NOTE — Progress Notes (Signed)
Right arm okay for lab draws. Per Dwain Sarna MD

## 2023-05-20 NOTE — Plan of Care (Signed)
Checked in with pt to make sure she had received her Breast Cancer Bag, she had. She did not have any questions at this time but I told her to reach out if she did.

## 2023-05-20 NOTE — Progress Notes (Signed)
   05/20/23 1036  TOC Brief Assessment  Insurance and Status Reviewed  Patient has primary care physician Yes  Home environment has been reviewed yes  Prior level of function: independent  Prior/Current Home Services No current home services  Social Determinants of Health Reivew SDOH reviewed no interventions necessary  Readmission risk has been reviewed Yes  Transition of care needs no transition of care needs at this time

## 2023-05-21 MED ORDER — DEXAMETHASONE SODIUM PHOSPHATE 10 MG/ML IJ SOLN
INTRAMUSCULAR | Status: DC | PRN
Start: 2023-05-19 — End: 2023-05-21
  Administered 2023-05-19: 10 mg

## 2023-05-21 MED ORDER — ROPIVACAINE HCL 5 MG/ML IJ SOLN
INTRAMUSCULAR | Status: DC | PRN
  Administered 2023-05-19: 30 mL via PERINEURAL

## 2023-05-21 NOTE — Anesthesia Procedure Notes (Signed)
Anesthesia Regional Block: Pectoralis block   Pre-Anesthetic Checklist: , timeout performed,  Correct Patient, Correct Site, Correct Laterality,  Correct Procedure, Correct Position, site marked,  Risks and benefits discussed,  Pre-op evaluation,  At surgeon's request and post-op pain management  Laterality: Right  Prep: Maximum Sterile Barrier Precautions used, chloraprep       Needles:  Injection technique: Single-shot  Needle Type: Echogenic Stimulator Needle     Needle Length: 9cm  Needle Gauge: 21     Additional Needles:   Procedures:,,,, ultrasound used (permanent image in chart),,    Narrative:  Start time: 05/19/2023 9:17 AM End time: 05/19/2023 9:20 AM Injection made incrementally with aspirations every 5 mL. Anesthesiologist: Elmer Picker, MD

## 2023-05-21 NOTE — Anesthesia Postprocedure Evaluation (Signed)
Anesthesia Post Note  Patient: Psychologist, sport and exercise  Procedure(s) Performed: RIGHT MASTECTOMY (Right: Breast) BREAST RECONSTRUCTION WITH PLACEMENT OF TISSUE EXPANDER AND ACELULAR DERMAL MATRIX (Right: Breast)     Patient location during evaluation: PACU Anesthesia Type: Regional and General Level of consciousness: awake and alert Pain management: pain level controlled Vital Signs Assessment: post-procedure vital signs reviewed and stable Respiratory status: spontaneous breathing, nonlabored ventilation, respiratory function stable and patient connected to nasal cannula oxygen Cardiovascular status: blood pressure returned to baseline and stable Postop Assessment: no apparent nausea or vomiting Anesthetic complications: no  No notable events documented.  Last Vitals:  Vitals:   05/20/23 0900 05/20/23 0918  BP:    Pulse:    Resp:    Temp:    SpO2: 97% 98%    Last Pain:  Vitals:   05/20/23 1149  TempSrc:   PainSc: 0-No pain                 Seymour Pavlak L Pravin Perezperez

## 2023-05-25 ENCOUNTER — Inpatient Hospital Stay: Payer: 59 | Attending: Adult Health

## 2023-05-25 ENCOUNTER — Encounter: Payer: Self-pay | Admitting: *Deleted

## 2023-05-25 DIAGNOSIS — Z79899 Other long term (current) drug therapy: Secondary | ICD-10-CM | POA: Insufficient documentation

## 2023-05-25 DIAGNOSIS — C50412 Malignant neoplasm of upper-outer quadrant of left female breast: Secondary | ICD-10-CM | POA: Insufficient documentation

## 2023-05-25 DIAGNOSIS — Z9011 Acquired absence of right breast and nipple: Secondary | ICD-10-CM | POA: Insufficient documentation

## 2023-05-25 DIAGNOSIS — Z170 Estrogen receptor positive status [ER+]: Secondary | ICD-10-CM | POA: Insufficient documentation

## 2023-05-25 NOTE — Progress Notes (Signed)
[facility] Clinical Social Work  Visual merchandiser (CSW) returned patient's phone call requesting information on financial assurance due to reoccurrence BC and subsequently left mastectomy. Patient previously applied for financial assistance, but unsure of organization. CSW emailed patient links for three organizations, patient will reach out to CSW when medical documentation is needed.  Marguerita Merles, LCSWA Clinical Social Worker [facility]

## 2023-05-26 ENCOUNTER — Telehealth: Payer: Self-pay

## 2023-05-26 NOTE — Telephone Encounter (Signed)
Pt called to ask about paperwork for her [facility] as it relates to her most recent surgery for reconstruction. Advised pt to reach out to surgeon's office to complete this portion. She verbalized thanks and understanding.

## 2023-05-31 ENCOUNTER — Inpatient Hospital Stay: Payer: 59

## 2023-05-31 NOTE — Progress Notes (Signed)
[facility] CSW Progress Note  Clinical Child psychotherapist contacted patient by phone to return missed call about grant application. CSW completed medical documentation letter and faxed off to [employer]. CSW will email the [facility] and see if patient qualifies for treatment.   Marguerita Merles, LCSWA Clinical Social Worker [facility]

## 2023-06-02 ENCOUNTER — Encounter: Payer: Self-pay | Admitting: *Deleted

## 2023-06-02 ENCOUNTER — Ambulatory Visit: Payer: 59 | Admitting: Hematology and Oncology

## 2023-06-02 ENCOUNTER — Inpatient Hospital Stay (HOSPITAL_BASED_OUTPATIENT_CLINIC_OR_DEPARTMENT_OTHER): Payer: 59 | Admitting: Hematology and Oncology

## 2023-06-02 VITALS — BP 124/93 | HR 73 | Temp 97.3°F | Resp 18 | Ht 69.0 in | Wt 220.1 lb

## 2023-06-02 DIAGNOSIS — Z170 Estrogen receptor positive status [ER+]: Secondary | ICD-10-CM

## 2023-06-02 DIAGNOSIS — Z79899 Other long term (current) drug therapy: Secondary | ICD-10-CM | POA: Diagnosis not present

## 2023-06-02 DIAGNOSIS — Z9011 Acquired absence of right breast and nipple: Secondary | ICD-10-CM | POA: Diagnosis not present

## 2023-06-02 DIAGNOSIS — C50412 Malignant neoplasm of upper-outer quadrant of left female breast: Secondary | ICD-10-CM | POA: Diagnosis present

## 2023-06-02 NOTE — Assessment & Plan Note (Addendum)
Bilateral lumpectomies 03/04/2022 Right lumpectomy: Intermediate grade to high-grade DCIS with necrosis and calcifications, right medial margin: Intermediate grade DCIS, right superior margin: Intermediate grade DCIS, final margins negative, right additional inferior margin: DCIS. Left lumpectomy: Grade 1 IDC 1.6 cm with DCIS margins negative; Left second lumpectomy: Grade 2 IDC 1 cm, DCIS, DCIS focally involves lateral margin, 0/3 lymph nodes negative Right mastectomy 05/19/2023: No residual DCIS   Treatment plan: 1. Oncotype DX score: 5 (ROR 3%) 2. Bilateral radiation therapies: 05/27/2022-07/13/2022 3.  Antiestrogen therapy started 07/27/2022    Tamoxifen toxicities: Cycles returned Hair thinning Slight mental clouding We reduced the dosage of tamoxifen to 10 mg a day  Breast cancer surveillance: Breast exam 01/21/2023: Benign Mammogram 01/21/2023: Indeterminate diffuse pleomorphic and coarse heterogeneous calcifications right breast spanning 9.6 cm Right breast biopsy 02/12/2023: Posterior: High-grade DCIS focal microinvasion cannot be excluded, right breast biopsy anterior: High-grade DCIS ER 100%, PR 80% Right mastectomy 05/19/2023: No residual DCIS  Return to clinic in 6 months for follow-up

## 2023-06-02 NOTE — Progress Notes (Signed)
Patient Care Team: Benita Stabile, MD as PCP - General (Internal Medicine) Deterding, Fayrene Fearing, MD as Consulting Physician (Nephrology) Serena Croissant, MD as Consulting Physician (Hematology and Oncology) Antony Blackbird, MD as Consulting Physician (Radiation Oncology) Emelia Loron, MD as Consulting Physician (General Surgery)  DIAGNOSIS:  Encounter Diagnosis  Name Primary?   Malignant neoplasm of upper-outer quadrant of left breast in female, estrogen receptor positive (HCC) Yes    SUMMARY OF ONCOLOGIC HISTORY: Oncology History  Malignant neoplasm of upper-outer quadrant of left breast in female, estrogen receptor positive (HCC)  01/22/2022 Initial Diagnosis   Work-up performed for intermittent bloody left nipple discharge.  Mammogram and ultrasound revealed bilateral breast masses  Right breast 2 hypoechoic masses 1.6 cm and 0.6 cm.  No axillary lymph nodes.  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 Left lumpectomy: Grade 1 IDC 1.6 cm with DCIS margins negative; Left second lumpectomy: Grade 2 IDC 1 cm, DCIS, DCIS focally involves lateral margin, 0/3 lymph nodes negative   03/04/2022 Cancer Staging   Staging form: Breast, AJCC 8th Edition - Pathologic stage from 03/04/2022: Stage IA (pT1c, pN0, cM0, G2, ER+, PR+, HER2-) - Signed by Loa Socks, NP on 11/10/2022 Histologic grading system: 3 grade system   05/26/2022 - 07/13/2022 Radiation Therapy   Site Technique Total Dose (Gy) Dose per Fx (Gy) Completed Fx Beam Energies  Breast, Right: Breast_R 3D 50.4/50.4 1.8 28/28  10X  Breast, Right: Breast_R_Bst specialPort 12/12 2 6/6 12E, 15E  Breast, Left: Breast_L 3D 50.4/50.4 1.8 28/28 10X  Breast, Left: Breast_L_Bst 3D 12/12 2 6/6 6X, 10X     07/2022 -  Anti-estrogen oral therapy   Tamoxifen   Ductal carcinoma in situ (DCIS) of right breast  02/09/2022 Initial Diagnosis   Ductal carcinoma in situ (DCIS) of right breast   03/04/2022 Cancer Staging   Staging form: Breast, AJCC 8th Edition - Pathologic stage from 03/04/2022: Stage 0 (pTis (DCIS), pN0, cM0, ER+, PR+) - Signed by Loa Socks, NP on 11/10/2022 Nuclear grade: G3   05/19/2023 Surgery   Right mastectomy: Negative for residual DCIS, focal ADH     CHIEF COMPLIANT: Breast cancer surveillance   INTERVAL HISTORY: Raven Walker is a 42 y.o. female is here because of prior diagnosis of bilateral breast cancers.  Recently she was diagnosed with a recurrence of DCIS in the right breast. Patient reports that things are going well She denies any pain or discomfort in breast. She has no complaints or any other side effects. She will resume taking the 10 mg Tamoxifen.   ALLERGIES:  is allergic to nickel.  MEDICATIONS:  Current Outpatient Medications  Medication Sig Dispense Refill   acetaminophen (TYLENOL) 500 MG tablet Take 1,000 mg by mouth every 6 (six) hours as needed for moderate pain.     calcitRIOL (ROCALTROL) 0.25 MCG capsule Take 0.25 mcg by mouth 2 (two) times daily.     calcium carbonate (TUMS EXTRA STRENGTH 750) 750 MG chewable tablet Chew 2 tablets by mouth 3 (three) times daily.     diltiazem (CARDIZEM) 30 MG tablet Take 30 mg by mouth 2 (two) times daily.  diphenhydramine-acetaminophen (TYLENOL PM) 25-500 MG TABS tablet Take 1 tablet by mouth at bedtime as needed (sleep).     furosemide (LASIX) 40 MG tablet Take 40 mg by mouth See admin instructions. Take 40 mg daily, may take a second 40 mg dose as needed for swelling     Iron-FA-B Cmp-C-Biot-Probiotic (FUSION  PLUS) CAPS Take 1 capsule by mouth daily.     magnesium oxide (MAG-OX) 400 (240 Mg) MG tablet Take 1 tablet by mouth 2 (two) times daily.     metoprolol tartrate (LOPRESSOR) 50 MG tablet Take 50 mg by mouth 2 (two) times daily.     Multiple Vitamins-Minerals (HAIR/SKIN/NAILS) TABS Take 2 tablets by mouth daily.     mycophenolate (CELLCEPT) 250 MG capsule Take 750 mg by mouth 2 (two) times daily.      pantoprazole (PROTONIX) 40 MG tablet Take 40 mg by mouth daily as needed (acid reflux).     predniSONE (DELTASONE) 5 MG tablet Take 5 mg by mouth daily.     tacrolimus (PROGRAF) 1 MG capsule Take 3 capsules (3 mg total) by mouth 2 (two) times daily.     tamoxifen (NOLVADEX) 10 MG tablet Take 1 tablet (10 mg total) by mouth daily. 90 tablet 3   No current facility-administered medications for this visit.    PHYSICAL EXAMINATION: ECOG PERFORMANCE STATUS: 1 - Symptomatic but completely ambulatory  Vitals:   06/02/23 0943  BP: (!) 124/93  Pulse: 73  Resp: 18  Temp: (!) 97.3 F (36.3 C)  SpO2: 100%   Filed Weights   06/02/23 0943  Weight: 220 lb 1.6 oz (99.8 kg)      LABORATORY DATA:  I have reviewed the data as listed    Latest Ref Rng & Units 05/20/2023    8:52 AM 05/10/2023    3:50 PM 04/09/2022    3:23 PM  CMP  Glucose 70 - 99 mg/dL 811  914  99   BUN 6 - 20 mg/dL 20  15  17    Creatinine 0.44 - 1.00 mg/dL 7.82  9.56  2.13   Sodium 135 - 145 mmol/L 140  142  142   Potassium 3.5 - 5.1 mmol/L 4.3  3.0  3.9   Chloride 98 - 111 mmol/L 103  108  101   CO2 22 - 32 mmol/L 22  21  27    Calcium 8.9 - 10.3 mg/dL 7.2  7.2  9.5     Lab Results  Component Value Date   WBC 3.7 (L) 05/10/2023   HGB 10.5 (L) 05/10/2023   HCT 32.7 (L) 05/10/2023   MCV 81.5 05/10/2023   PLT 237 05/10/2023   NEUTROABS 9.2 (H) 08/30/2013    ASSESSMENT & PLAN:  Malignant neoplasm of upper-outer quadrant of left breast in female, estrogen receptor positive (HCC) Bilateral lumpectomies 03/04/2022 Right  lumpectomy: Intermediate grade to high-grade DCIS with necrosis and calcifications, right medial margin: Intermediate grade DCIS, right superior margin: Intermediate grade DCIS, final margins negative, right additional inferior margin: DCIS. Left lumpectomy: Grade 1 IDC 1.6 cm with DCIS margins negative; Left second lumpectomy: Grade 2 IDC 1 cm, DCIS, DCIS focally involves lateral margin, 0/3 lymph nodes negative Right mastectomy 05/19/2023: No residual DCIS   Treatment plan: 1. Oncotype DX score: 5 (ROR 3%) 2. Bilateral radiation therapies: 05/27/2022-07/13/2022 3.  Antiestrogen therapy started 07/27/2022    Tamoxifen toxicities: Cycles returned Hair thinning Slight mental clouding We reduced the dosage of tamoxifen to 10 mg a day  Breast cancer  surveillance: Breast exam 01/21/2023: Benign Mammogram 01/21/2023: Indeterminate diffuse pleomorphic and coarse heterogeneous calcifications right breast spanning 9.6 cm Right breast biopsy 02/12/2023: Posterior: High-grade DCIS focal microinvasion cannot be excluded, right breast biopsy anterior: High-grade DCIS ER 100%, PR 80% Right mastectomy 05/19/2023: No residual DCIS  Return to clinic in 6 months for follow-up    No orders of the defined types were placed in this encounter.  The patient has a good understanding of the overall plan. she agrees with it. she will call with any problems that may develop before the next visit here. Total time spent: 30 mins including face to face time and time spent for planning, charting and co-ordination of care   Tamsen Meek, MD 06/02/23    I Janan Ridge am acting as a Neurosurgeon for [employer]  I have reviewed the above documentation for accuracy and completeness, and I agree with the above.

## 2023-06-10 LAB — SURGICAL PATHOLOGY

## 2023-06-14 ENCOUNTER — Ambulatory Visit: Payer: 59 | Admitting: Hematology and Oncology

## 2023-06-15 NOTE — Therapy (Signed)
OUTPATIENT PHYSICAL THERAPY  UPPER EXTREMITY ONCOLOGY EVALUATION  Patient Name: Raven Walker MRN: [id number] DOB:27-Sep-1981, 42 y.o., female Today's Date: 06/16/2023  END OF SESSION:  PT End of Session - 06/16/23 1153     Visit Number 1    Number of Visits 9    Date for PT Re-Evaluation 07/14/23    PT Start Time 1105    PT Stop Time 1153    PT Time Calculation (min) 48 min    Activity Tolerance Patient tolerated treatment well    Behavior During Therapy [facility] for tasks assessed/performed             Past Medical History:  Diagnosis Date   Anemia    history   BV (bacterial vaginosis) 06/04/2014   Cancer (HCC) 2024   Right Breast Cancer   Chronic kidney disease 2013   Kidney Transplant   ESRD on peritoneal dialysis (HCC) 10/05/2006   pt had kidney transplant 06/2012 and is no longer on dialysis   GERD (gastroesophageal reflux disease) 09/04/2009   ulcerative esophagitis per EGD    H/O kidney transplant 06/19/2012   Hematuria 07/02/2014   History of radiation therapy    Right Breast, left Breast- 05/26/22-07/13/22-Dr. Antony Blackbird   Hypertension    Poor appetite    History   Secondary hyperparathyroidism (HCC)    s/p parathyroidectomy with autotransplantation to left arm per Dr. Fredric Dine on 08/04/11   Streptococcal peritonitis (HCC) 06/06/2011   SVD (spontaneous vaginal delivery)    x 1   Urinary frequency 07/02/2014   UTI (lower urinary tract infection) 07/02/2014   Vaginal discharge 06/04/2014   Past Surgical History:  Procedure Laterality Date   arm graphs  10/05/2006   three - done at [facility]; still in but never used because pt got a transplant.   AXILLARY SENTINEL NODE BIOPSY Left 03/04/2022   Procedure: LEFT AXILLARY SENTINEL NODE BIOPSY;  Surgeon: Emelia Loron, MD;  Location: [facility] OR;  Service: General;  Laterality: Left;   BREAST BIOPSY Right 02/05/2023   MM RT BREAST BX W LOC DEV 1ST LESION IMAGE BX SPEC STEREO GUIDE 02/05/2023  [facility]   BREAST BIOPSY Right 02/12/2023   MM RT BREAST BX W LOC DEV EA AD LESION IMG BX SPEC STEREO GUIDE 02/12/2023 [facility]   BREAST BIOPSY Right 02/12/2023   MM RT BREAST BX W LOC DEV 1ST LESION IMAGE BX SPEC STEREO GUIDE 02/12/2023 [facility]   BREAST LUMPECTOMY WITH RADIOACTIVE SEED LOCALIZATION Bilateral 03/04/2022   Procedure: BILATERAL BRACKETED BREAST LUMPECTOMY WITH RADIOACTIVE SEED LOCALIZATION;  Surgeon: Emelia Loron, MD;  Location: [facility] OR;  Service: General;  Laterality: Bilateral;   BREAST RECONSTRUCTION WITH PLACEMENT OF TISSUE EXPANDER AND FLEX HD (ACELLULAR HYDRATED DERMIS) Right 05/19/2023   Procedure: BREAST RECONSTRUCTION WITH PLACEMENT OF TISSUE EXPANDER AND ACELULAR DERMAL MATRIX;  Surgeon: Allena Napoleon, MD;  Location: [facility] OR;  Service: Plastics;  Laterality: Right;   DILATATION & CURETTAGE/HYSTEROSCOPY WITH TRUECLEAR N/A 04/16/2014   Procedure: DILATATION & CURETTAGE/HYSTEROSCOPY WITH TRUCLEAR AND RESECTION OF FIBROID/THERMACHOICE ABLATION;  Surgeon: Tilda Burrow, MD;  Location: [facility] OR[facility];  Service: Gynecology;  Laterality: N/A;   KIDNEY TRANSPLANT Right 10/06/2011   [facility]   PARATHYROIDECTOMY  08/05/2011   with graft of parathyroid into left upper arm.   RE-EXCISION OF BREAST LUMPECTOMY Left 04/14/2022   Procedure: LEFT BREAST RE-EXCISION LUMPECTOMY;  Surgeon: Emelia Loron, MD;  Location: [facility];  Service: General;  Laterality: Left;  SIMPLE MASTECTOMY WITH AXILLARY SENTINEL NODE BIOPSY Right 05/19/2023   Procedure: RIGHT MASTECTOMY;  Surgeon: Emelia Loron, MD;  Location: [facility] OR;  Service: General;  Laterality: Right;   UMBILICAL HERNIA REPAIR     WISDOM TOOTH EXTRACTION     Patient Active Problem List   Diagnosis Date Noted   S/P mastectomy, right 05/19/2023   Malignant neoplasm of upper-outer quadrant of left breast in female, estrogen receptor positive (HCC) 02/09/2022   Ductal carcinoma in  situ (DCIS) of right breast 02/09/2022   Fibroids 01/06/2018   Abnormal uterine bleeding (AUB) 08/15/2015   Menorrhagia with regular cycle 03/14/2015   Urinary frequency 07/02/2014   Hematuria 07/02/2014   UTI (lower urinary tract infection) 07/02/2014   Vaginal discharge 06/04/2014   BV (bacterial vaginosis) 06/04/2014   Post-operative state 05/02/2014   Submucous uterine fibroid with menorrhagia 02/05/2014   Sepsis (HCC) 11/20/2013   UTI (urinary tract infection) 11/20/2013   Myalgia 11/20/2013   Acute on chronic renal failure (HCC) 11/20/2013   Leukocytosis, unspecified 11/20/2013   Menorrhagia, premenopausal 11/15/2013   Fever 08/30/2013   Acute pyelonephritis 06/29/2013   History of renal transplant 06/29/2013   Other ureteric obstruction 08/11/2012   Lymphocele 07/15/2012   Edema of both legs 07/04/2012   Immunosuppression (HCC) 06/28/2012   Hypocalcemia syndrome 08/19/2011    Class: Acute   Renal failure (ARF), acute on chronic (HCC) 08/19/2011    Class: Acute   HTN (hypertension) 08/19/2011    Class: Acute   GERD (gastroesophageal reflux disease) 08/19/2011    Class: Chronic   Hyperparathyroidism, secondary (HCC) 05/19/2011    PCP: Benita Stabile, MD  REFERRING PROVIDER: Emelia Loron, MD  REFERRING DIAG: [phone] (ICD-10-CM) - Other specified postprocedural states D05.11 (ICD-10-CM) - Intraductal carcinoma in situ of right breast C50.411 (ICD-10-CM) - Malignant neoplasm of upper-outer quadrant of right female breast  THERAPY DIAG:  Stiffness of right shoulder, not elsewhere classified  Aftercare following surgery for neoplasm  Lymphedema, not elsewhere classified  Abnormal posture  Malignant neoplasm of upper-outer quadrant of left breast in female, estrogen receptor positive (HCC)  Malignant neoplasm of upper-outer quadrant of right breast in female, estrogen receptor positive (HCC)  ONSET DATE: 05/19/23  Rationale for Evaluation and Treatment:  Rehabilitation  SUBJECTIVE:                                                                                                                                                                                           SUBJECTIVE STATEMENT: I had a mastectomy and Dr. Dwain Sarna wanted me to come. I do feel tightness. I got a sleeve for my L arm  and the swelling is doing better.   PERTINENT HISTORY: Patient was diagnosed on 01/26/2022 with bilateral  breast Cancer with Right  gr. 2 DCIS ER, PR + and left Invasive ductal carcinoma. It measures .6 cm and is located in the upper outer quadrant. It is ER, PR +, Her 2 - with a Ki67 of 1%. She is s/p bilateral breast lumpectomies and left SLNB (0/3) on 03/04/2022.  Had left breast re-excision on 04/14/2022. Diagnosed with recurrence of R DCIS (ER/PR+, HER2-) and underwent a mastectomy and expander reconstruction on 05/19/23. She is also to have radiation and anti estrogen. She is s/p a kidney transplant in 2013   PAIN:  Are you having pain? Yes NPRS scale: 6/10 Pain location: anterior R upper quadrant Pain orientation: Right  PAIN TYPE: tight Pain description: intermittent  Aggravating factors:nothing  Relieving factors: tylenol, rest  PRECAUTIONS: Other: at risk for lymphedema bilaterally  RED FLAGS: None   WEIGHT BEARING RESTRICTIONS: No  FALLS:  Has patient fallen in last 6 months? 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,   LEISURE: pt does not exercise  HAND DOMINANCE: right   PRIOR LEVEL OF FUNCTION: Independent  PATIENT GOALS: to get back to normal   OBJECTIVE:  COGNITION: Overall cognitive status: Within functional limits for tasks assessed   PALPATION: Tightness palpable along R lateral trunk  OBSERVATIONS / OTHER ASSESSMENTS: healing mastectomy scar with expander in  place  POSTURE: forward head, rounded shoulders  UPPER EXTREMITY AROM/PROM:  A/PROM RIGHT   eval   Shoulder extension 66  Shoulder flexion 141  Shoulder abduction 138  Shoulder internal rotation 60  Shoulder external rotation 95    (Blank rows = not tested)  A/PROM LEFT   eval  Shoulder extension 88  Shoulder flexion 176  Shoulder abduction 180  Shoulder internal rotation 71  Shoulder external rotation 94    (Blank rows = not tested)  UPPER EXTREMITY STRENGTH: 5/5  LYMPHEDEMA ASSESSMENTS:   SURGERY TYPE/DATE: 03/04/22- bilateral lumpectomies and L SLNB, 04/14/22- L breast re excision, 05/19/23- R mastectomy due to recurrence   NUMBER OF LYMPH NODES REMOVED: 0/3  CHEMOTHERAPY: none  RADIATION:completed 07/13/22  HORMONE TREATMENT: currently on Tamoxifen  INFECTIONS: none   LYMPHEDEMA ASSESSMENTS:   LANDMARK RIGHT  eval  At axilla  40.5  15 cm proximal to olecranon process 39.5  10 cm proximal to olecranon process 37.6  Olecranon process 28  15 cm proximal to ulnar styloid process 28.5  10 cm proximal to ulnar styloid process 23.5  Just proximal to ulnar styloid process 17.2  Across hand at thumb web space 20.5  At base of 2nd digit 6.9  (Blank rows = not tested)  LANDMARK LEFT  eval  At axilla  41  15 cm proximal to olecranon process 40.6  10 cm proximal to olecranon process 38.6  Olecranon process 28.3  15 cm proximal to ulnar styloid process 28.1  10 cm proximal to ulnar styloid process 24  Just proximal to ulnar styloid process 18  Across hand at thumb web space 21.2  At base of 2nd digit 7.2  (Blank rows = not tested)   QUICK DASH SURVEY:   Neldon Mc - 06/16/23 0001     Open a tight or new jar Unable    Do heavy household chores (wash walls, wash floors)  Mild difficulty    Carry a shopping bag or briefcase No difficulty    Wash your back No difficulty    Use a knife to cut food No difficulty    Recreational activities in which you take  some force or impact through your arm, shoulder, or hand (golf, hammering, tennis) Unable    During the past week, to what extent has your arm, shoulder or hand problem interfered with your normal social activities with family, friends, neighbors, or groups? Modererately    During the past week, to what extent has your arm, shoulder or hand problem limited your work or other regular daily activities Modererately    Arm, shoulder, or hand pain. None    Tingling (pins and needles) in your arm, shoulder, or hand None    Difficulty Sleeping Moderate difficulty    DASH Score 34.09 %               TODAY'S TREATMENT:                                                                                                                                          DATE:  06/16/23: PROM to R shoulder in to flexion and abduction with v/c to relax to allow PROM, pt reports increased comfort afterwards, Issued info to pt to follow up on compression sleeve with [employer] for LUE that she was measured for earlier this year.     PATIENT EDUCATION:  Education details: need for post op PT for improving ROM Person educated: Patient Education method: Explanation Education comprehension: verbalized understanding  HOME EXERCISE PROGRAM: Post op breast exercises  ASSESSMENT:  CLINICAL IMPRESSION: Patient is a 42 y.o. female who was seen today for physical therapy evaluation and treatment for decreased R shoulder ROM following R mastectomy on 05/19/23. She had a previous history of bilateral lumpectomies and L SLNB in 2023. Her mastectomy scar is still healing and she has an expander in place. There are no signs of lymphedema on the R side. Her L UE circumferences are larger than R and she still has visible lymphedema throughout LUE. She was measured for custom compression garments earlier this year but the cost was too high so she ordered a sleeve online. Her job requires repetitive UE motion. Issued info to pt to reach  out to [facility] to follow up on her sleeve. Pt would benefit from skilled PT services to improve R shoulder ROM, assist pt with obtaining garments and managing LUE lymphedema and decrease discomfort.    OBJECTIVE IMPAIRMENTS: decreased ROM, decreased strength, increased edema, increased fascial restrictions, impaired UE functional use, postural dysfunction, and pain.   ACTIVITY LIMITATIONS: carrying, lifting, and reach over head  PARTICIPATION LIMITATIONS: meal prep, cleaning, laundry, community activity, occupation, and yard work  PERSONAL FACTORS: 1 comorbidity: hx of lumpectomy and radiation  are also affecting patient's functional outcome.   REHAB POTENTIAL:  Good  CLINICAL DECISION MAKING: Stable/uncomplicated  EVALUATION COMPLEXITY: Low  GOALS: Goals reviewed with patient? Yes  SHORT TERM GOALS=LONG TERM GOALS Target date: 07/14/23  Pt will obtain appropriate compression garments for long term management of LUE lymphedema. Baseline: Goal status: INITIAL  2.  Pt will be independent with self MLD and compression bandaging for LUE. Baseline:  Goal status: INITIAL  3.  Pt will demonstrate 165 degrees of R shoulder flexion to allow her to reach overhead. Baseline:  Goal status: INITIAL  4.  Pt will demonstrate 165 degrees of R shoulder abduction to allow her to reach out to the side. Baseline:  Goal status: INITIAL  5.  Pt will be independent in a home exercise program for continued strengthening and stretching.  Baseline:  Goal status: INITIAL  6.  Pt will report a 50% improvement in pain and discomfort in R upper quadrant to allow improved comfort.  Baseline:  Goal status: INITIAL  PLAN:  PT FREQUENCY: 2x/week  PT DURATION: 4 weeks  PLANNED INTERVENTIONS: Therapeutic exercises, Therapeutic activity, Patient/Family education, Self Care, Joint mobilization, Orthotic/Fit training, Manual lymph drainage, Compression bandaging, scar mobilization, Taping, Vasopneumatic  device, Manual therapy, and Re-evaluation  PLAN FOR NEXT SESSION: pulleys, ball, PROM R shoulder, STM R lateral trunk (serratus/lats)  Milagros Loll Blue, PT 06/16/2023, 12:16 PM

## 2023-06-16 ENCOUNTER — Ambulatory Visit: Payer: 59 | Attending: General Surgery | Admitting: Physical Therapy

## 2023-06-16 ENCOUNTER — Encounter: Payer: Self-pay | Admitting: Physical Therapy

## 2023-06-16 ENCOUNTER — Other Ambulatory Visit: Payer: Self-pay

## 2023-06-16 DIAGNOSIS — M25611 Stiffness of right shoulder, not elsewhere classified: Secondary | ICD-10-CM | POA: Diagnosis not present

## 2023-06-16 DIAGNOSIS — Z983 Post therapeutic collapse of lung status: Secondary | ICD-10-CM | POA: Diagnosis not present

## 2023-06-16 DIAGNOSIS — Z483 Aftercare following surgery for neoplasm: Secondary | ICD-10-CM

## 2023-06-16 DIAGNOSIS — Z170 Estrogen receptor positive status [ER+]: Secondary | ICD-10-CM

## 2023-06-16 DIAGNOSIS — R293 Abnormal posture: Secondary | ICD-10-CM | POA: Insufficient documentation

## 2023-06-16 DIAGNOSIS — C50411 Malignant neoplasm of upper-outer quadrant of right female breast: Secondary | ICD-10-CM | POA: Diagnosis present

## 2023-06-16 DIAGNOSIS — C50412 Malignant neoplasm of upper-outer quadrant of left female breast: Secondary | ICD-10-CM | POA: Diagnosis not present

## 2023-06-16 DIAGNOSIS — Z9889 Other specified postprocedural states: Secondary | ICD-10-CM | POA: Diagnosis not present

## 2023-06-16 DIAGNOSIS — I890 Lymphedema, not elsewhere classified: Secondary | ICD-10-CM | POA: Insufficient documentation

## 2023-06-21 ENCOUNTER — Ambulatory Visit: Payer: 59

## 2023-06-21 DIAGNOSIS — C50412 Malignant neoplasm of upper-outer quadrant of left female breast: Secondary | ICD-10-CM

## 2023-06-21 DIAGNOSIS — I890 Lymphedema, not elsewhere classified: Secondary | ICD-10-CM

## 2023-06-21 DIAGNOSIS — M25611 Stiffness of right shoulder, not elsewhere classified: Secondary | ICD-10-CM

## 2023-06-21 DIAGNOSIS — R293 Abnormal posture: Secondary | ICD-10-CM

## 2023-06-21 DIAGNOSIS — C50411 Malignant neoplasm of upper-outer quadrant of right female breast: Secondary | ICD-10-CM | POA: Diagnosis not present

## 2023-06-21 DIAGNOSIS — Z483 Aftercare following surgery for neoplasm: Secondary | ICD-10-CM

## 2023-06-21 DIAGNOSIS — Z170 Estrogen receptor positive status [ER+]: Secondary | ICD-10-CM

## 2023-06-21 NOTE — Therapy (Signed)
OUTPATIENT PHYSICAL THERAPY  UPPER EXTREMITY ONCOLOGY TREATMENT  Patient Name: Raven Walker MRN: [id number] DOB:July 29, 1981, 42 y.o., female Today's Date: 06/21/2023  END OF SESSION:  PT End of Session - 06/21/23 1613     Visit Number 2    Number of Visits 9    Date for PT Re-Evaluation 07/14/23    PT Start Time 1611    PT Stop Time 1705    PT Time Calculation (min) 54 min    Activity Tolerance Patient tolerated treatment well    Behavior During Therapy [facility] for tasks assessed/performed             Past Medical History:  Diagnosis Date   Anemia    history   BV (bacterial vaginosis) 06/04/2014   Cancer (HCC) 2024   Right Breast Cancer   Chronic kidney disease 2013   Kidney Transplant   ESRD on peritoneal dialysis (HCC) 10/05/2006   pt had kidney transplant 06/2012 and is no longer on dialysis   GERD (gastroesophageal reflux disease) 09/04/2009   ulcerative esophagitis per EGD    H/O kidney transplant 06/19/2012   Hematuria 07/02/2014   History of radiation therapy    Right Breast, left Breast- 05/26/22-07/13/22-Dr. Antony Blackbird   Hypertension    Poor appetite    History   Secondary hyperparathyroidism (HCC)    s/p parathyroidectomy with autotransplantation to left arm per Dr. Fredric Dine on 08/04/11   Streptococcal peritonitis (HCC) 06/06/2011   SVD (spontaneous vaginal delivery)    x 1   Urinary frequency 07/02/2014   UTI (lower urinary tract infection) 07/02/2014   Vaginal discharge 06/04/2014   Past Surgical History:  Procedure Laterality Date   arm graphs  10/05/2006   three - done at [facility]; still in but never used because pt got a transplant.   AXILLARY SENTINEL NODE BIOPSY Left 03/04/2022   Procedure: LEFT AXILLARY SENTINEL NODE BIOPSY;  Surgeon: Emelia Loron, MD;  Location: [facility] OR;  Service: General;  Laterality: Left;   BREAST BIOPSY Right 02/05/2023   MM RT BREAST BX W LOC DEV 1ST LESION IMAGE BX SPEC STEREO GUIDE 02/05/2023  [facility]   BREAST BIOPSY Right 02/12/2023   MM RT BREAST BX W LOC DEV EA AD LESION IMG BX SPEC STEREO GUIDE 02/12/2023 [facility]   BREAST BIOPSY Right 02/12/2023   MM RT BREAST BX W LOC DEV 1ST LESION IMAGE BX SPEC STEREO GUIDE 02/12/2023 [facility]   BREAST LUMPECTOMY WITH RADIOACTIVE SEED LOCALIZATION Bilateral 03/04/2022   Procedure: BILATERAL BRACKETED BREAST LUMPECTOMY WITH RADIOACTIVE SEED LOCALIZATION;  Surgeon: Emelia Loron, MD;  Location: [facility] OR;  Service: General;  Laterality: Bilateral;   BREAST RECONSTRUCTION WITH PLACEMENT OF TISSUE EXPANDER AND FLEX HD (ACELLULAR HYDRATED DERMIS) Right 05/19/2023   Procedure: BREAST RECONSTRUCTION WITH PLACEMENT OF TISSUE EXPANDER AND ACELULAR DERMAL MATRIX;  Surgeon: Allena Napoleon, MD;  Location: [facility] OR;  Service: Plastics;  Laterality: Right;   DILATATION & CURETTAGE/HYSTEROSCOPY WITH TRUECLEAR N/A 04/16/2014   Procedure: DILATATION & CURETTAGE/HYSTEROSCOPY WITH TRUCLEAR AND RESECTION OF FIBROID/THERMACHOICE ABLATION;  Surgeon: Tilda Burrow, MD;  Location: [facility];  Service: Gynecology;  Laterality: N/A;   KIDNEY TRANSPLANT Right 10/06/2011   [facility]   PARATHYROIDECTOMY  08/05/2011   with graft of parathyroid into left upper arm.   RE-EXCISION OF BREAST LUMPECTOMY Left 04/14/2022   Procedure: LEFT BREAST RE-EXCISION LUMPECTOMY;  Surgeon: Emelia Loron, MD;  Location: [facility];  Service: General;  Laterality: Left;  SIMPLE MASTECTOMY WITH AXILLARY SENTINEL NODE BIOPSY Right 05/19/2023   Procedure: RIGHT MASTECTOMY;  Surgeon: Emelia Loron, MD;  Location: [facility] OR;  Service: General;  Laterality: Right;   UMBILICAL HERNIA REPAIR     WISDOM TOOTH EXTRACTION     Patient Active Problem List   Diagnosis Date Noted   S/P mastectomy, right 05/19/2023   Malignant neoplasm of upper-outer quadrant of left breast in female, estrogen receptor positive (HCC) 02/09/2022   Ductal carcinoma in  situ (DCIS) of right breast 02/09/2022   Fibroids 01/06/2018   Abnormal uterine bleeding (AUB) 08/15/2015   Menorrhagia with regular cycle 03/14/2015   Urinary frequency 07/02/2014   Hematuria 07/02/2014   UTI (lower urinary tract infection) 07/02/2014   Vaginal discharge 06/04/2014   BV (bacterial vaginosis) 06/04/2014   Post-operative state 05/02/2014   Submucous uterine fibroid with menorrhagia 02/05/2014   Sepsis (HCC) 11/20/2013   UTI (urinary tract infection) 11/20/2013   Myalgia 11/20/2013   Acute on chronic renal failure (HCC) 11/20/2013   Leukocytosis, unspecified 11/20/2013   Menorrhagia, premenopausal 11/15/2013   Fever 08/30/2013   Acute pyelonephritis 06/29/2013   History of renal transplant 06/29/2013   Other ureteric obstruction 08/11/2012   Lymphocele 07/15/2012   Edema of both legs 07/04/2012   Immunosuppression (HCC) 06/28/2012   Hypocalcemia syndrome 08/19/2011    Class: Acute   Renal failure (ARF), acute on chronic (HCC) 08/19/2011    Class: Acute   HTN (hypertension) 08/19/2011    Class: Acute   GERD (gastroesophageal reflux disease) 08/19/2011    Class: Chronic   Hyperparathyroidism, secondary (HCC) 05/19/2011    PCP: Benita Stabile, MD  REFERRING PROVIDER: Emelia Loron, MD  REFERRING DIAG: [phone] (ICD-10-CM) - Other specified postprocedural states D05.11 (ICD-10-CM) - Intraductal carcinoma in situ of right breast C50.411 (ICD-10-CM) - Malignant neoplasm of upper-outer quadrant of right female breast  THERAPY DIAG:  Stiffness of right shoulder, not elsewhere classified  Aftercare following surgery for neoplasm  Lymphedema, not elsewhere classified  Abnormal posture  Malignant neoplasm of upper-outer quadrant of left breast in female, estrogen receptor positive (HCC)  Malignant neoplasm of upper-outer quadrant of right breast in female, estrogen receptor positive (HCC)  ONSET DATE: 05/19/23  Rationale for Evaluation and Treatment:  Rehabilitation  SUBJECTIVE:                                                                                                                                                                                           SUBJECTIVE STATEMENT: I'm feeling pretty good today. The massage she did last time felt really good and I was looser when I left. And as  far as my sleeve goes, I found out that since my deductible has been met my sleeves will be free!  PERTINENT HISTORY: Patient was diagnosed on 01/26/2022 with bilateral  breast Cancer with Right  gr. 2 DCIS ER, PR + and left Invasive ductal carcinoma. It measures .6 cm and is located in the upper outer quadrant. It is ER, PR +, Her 2 - with a Ki67 of 1%. She is s/p bilateral breast lumpectomies and left SLNB (0/3) on 03/04/2022.  Had left breast re-excision on 04/14/2022. Diagnosed with recurrence of R DCIS (ER/PR+, HER2-) and underwent a mastectomy and expander reconstruction on 05/19/23. She is also to have radiation and anti estrogen. She is s/p a kidney transplant in 2013   PAIN:  Are you having pain? No, just tightness across chest  PRECAUTIONS: Other: at risk for lymphedema bilaterally  RED FLAGS: None   WEIGHT BEARING RESTRICTIONS: No  FALLS:  Has patient fallen in last 6 months? 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,   LEISURE: pt does not exercise  HAND DOMINANCE: right   PRIOR LEVEL OF FUNCTION: Independent  PATIENT GOALS: to get back to normal   OBJECTIVE:  COGNITION: Overall cognitive status: Within functional limits for tasks assessed   PALPATION: Tightness palpable along R lateral trunk  OBSERVATIONS / OTHER ASSESSMENTS: healing mastectomy scar with expander in place  POSTURE: forward head, rounded shoulders  UPPER EXTREMITY AROM/PROM:  A/PROM RIGHT    eval   Shoulder extension 66  Shoulder flexion 141  Shoulder abduction 138  Shoulder internal rotation 60  Shoulder external rotation 95    (Blank rows = not tested)  A/PROM LEFT   eval  Shoulder extension 88  Shoulder flexion 176  Shoulder abduction 180  Shoulder internal rotation 71  Shoulder external rotation 94    (Blank rows = not tested)  UPPER EXTREMITY STRENGTH: 5/5  LYMPHEDEMA ASSESSMENTS:   SURGERY TYPE/DATE: 03/04/22- bilateral lumpectomies and L SLNB, 04/14/22- L breast re excision, 05/19/23- R mastectomy due to recurrence   NUMBER OF LYMPH NODES REMOVED: 0/3  CHEMOTHERAPY: none  RADIATION:completed 07/13/22  HORMONE TREATMENT: currently on Tamoxifen  INFECTIONS: none   LYMPHEDEMA ASSESSMENTS:   LANDMARK RIGHT  eval  At axilla  40.5  15 cm proximal to olecranon process 39.5  10 cm proximal to olecranon process 37.6  Olecranon process 28  15 cm proximal to ulnar styloid process 28.5  10 cm proximal to ulnar styloid process 23.5  Just proximal to ulnar styloid process 17.2  Across hand at thumb web space 20.5  At base of 2nd digit 6.9  (Blank rows = not tested)  LANDMARK LEFT  eval  At axilla  41  15 cm proximal to olecranon process 40.6  10 cm proximal to olecranon process 38.6  Olecranon process 28.3  15 cm proximal to ulnar styloid process 28.1  10 cm proximal to ulnar styloid process 24  Just proximal to ulnar styloid process 18  Across hand at thumb web space 21.2  At base of 2nd digit 7.2  (Blank rows = not tested)   QUICK DASH SURVEY:       TODAY'S TREATMENT:  DATE:  06/21/23: Therapeutic Exercises Pulleys into flex and abd x 2 mins each Roll yellow ball up wall :Flex and Rt abd x 10  Manual Therapy P/ROM to Rt shoulders into flex, abd and D2 to pts tolerance and with scapular depression  throughout STM to Rt pect tendon and Rt lateral trunk along areas of tightness, started in supine then with pt in Lt S/L with cocoa butter, also to medial scapular border Scap Mobs to Rt scapula into protraction and retraction  06/16/23: PROM to R shoulder in to flexion and abduction with v/c to relax to allow PROM, pt reports increased comfort afterwards, Issued info to pt to follow up on compression sleeve with [employer] for LUE that she was measured for earlier this year.     PATIENT EDUCATION:  Education details: need for post op PT for improving ROM Person educated: Patient Education method: Explanation Education comprehension: verbalized understanding  HOME EXERCISE PROGRAM: Post op breast exercises  ASSESSMENT:  CLINICAL IMPRESSION: Pt reports her new compression sleeves should arrive in next 8-10 days. Today progressed pt to include AA/ROM with pulleys and ball roll up wall. She reported feeling good stretches with these. Then continued with manual therapy working to decrease her Rt upper quadrant tightness.    OBJECTIVE IMPAIRMENTS: decreased ROM, decreased strength, increased edema, increased fascial restrictions, impaired UE functional use, postural dysfunction, and pain.   ACTIVITY LIMITATIONS: carrying, lifting, and reach over head  PARTICIPATION LIMITATIONS: meal prep, cleaning, laundry, community activity, occupation, and yard work  PERSONAL FACTORS: 1 comorbidity: hx of lumpectomy and radiation  are also affecting patient's functional outcome.   REHAB POTENTIAL: Good  CLINICAL DECISION MAKING: Stable/uncomplicated  EVALUATION COMPLEXITY: Low  GOALS: Goals reviewed with patient? Yes  SHORT TERM GOALS=LONG TERM GOALS Target date: 07/14/23  Pt will obtain appropriate compression garments for long term management of LUE lymphedema. Baseline: Goal status: INITIAL  2.  Pt will be independent with self MLD and compression bandaging for LUE. Baseline:  Goal status:  INITIAL  3.  Pt will demonstrate 165 degrees of R shoulder flexion to allow her to reach overhead. Baseline:  Goal status: INITIAL  4.  Pt will demonstrate 165 degrees of R shoulder abduction to allow her to reach out to the side. Baseline:  Goal status: INITIAL  5.  Pt will be independent in a home exercise program for continued strengthening and stretching.  Baseline:  Goal status: INITIAL  6.  Pt will report a 50% improvement in pain and discomfort in R upper quadrant to allow improved comfort.  Baseline:  Goal status: INITIAL  PLAN:  PT FREQUENCY: 2x/week  PT DURATION: 4 weeks  PLANNED INTERVENTIONS: Therapeutic exercises, Therapeutic activity, Patient/Family education, Self Care, Joint mobilization, Orthotic/Fit training, Manual lymph drainage, Compression bandaging, scar mobilization, Taping, Vasopneumatic device, Manual therapy, and Re-evaluation  PLAN FOR NEXT SESSION: pulleys, ball, PROM R shoulder, STM R lateral trunk (serratus/lats); assess fit of sleeves when pt brings them in  Hermenia Bers, PTA 06/21/2023, 5:18 PM

## 2023-06-22 ENCOUNTER — Inpatient Hospital Stay: Payer: 59 | Attending: Adult Health

## 2023-06-22 NOTE — Progress Notes (Signed)
[facility] CSW Progress Note  Patient contacted CSW to obtain medical documentation for her grant application to the [employer]. CSW completed medical documentation and emailed them to address on file. CSW called patient after completion of the email to discuss documents sent. Patient will follow up if needed.  Marguerita Merles, LCSWA Clinical Social Worker [facility]

## 2023-06-23 ENCOUNTER — Ambulatory Visit: Payer: 59

## 2023-06-23 DIAGNOSIS — C50412 Malignant neoplasm of upper-outer quadrant of left female breast: Secondary | ICD-10-CM

## 2023-06-23 DIAGNOSIS — Z483 Aftercare following surgery for neoplasm: Secondary | ICD-10-CM

## 2023-06-23 DIAGNOSIS — C50411 Malignant neoplasm of upper-outer quadrant of right female breast: Secondary | ICD-10-CM

## 2023-06-23 DIAGNOSIS — R293 Abnormal posture: Secondary | ICD-10-CM

## 2023-06-23 DIAGNOSIS — M25611 Stiffness of right shoulder, not elsewhere classified: Secondary | ICD-10-CM

## 2023-06-23 DIAGNOSIS — I890 Lymphedema, not elsewhere classified: Secondary | ICD-10-CM

## 2023-06-23 NOTE — Therapy (Signed)
OUTPATIENT PHYSICAL THERAPY  UPPER EXTREMITY ONCOLOGY TREATMENT  Patient Name: Raven Walker MRN: [id number] DOB:22-Nov-1980, 42 y.o., female Today's Date: 06/23/2023  END OF SESSION:  PT End of Session - 06/23/23 1102     Visit Number 3    Number of Visits 9    Date for PT Re-Evaluation 07/14/23    PT Start Time 1102    PT Stop Time 1156    PT Time Calculation (min) 54 min    Activity Tolerance Patient tolerated treatment well    Behavior During Therapy [facility] for tasks assessed/performed             Past Medical History:  Diagnosis Date   Anemia    history   BV (bacterial vaginosis) 06/04/2014   Cancer (HCC) 2024   Right Breast Cancer   Chronic kidney disease 2013   Kidney Transplant   ESRD on peritoneal dialysis (HCC) 10/05/2006   pt had kidney transplant 06/2012 and is no longer on dialysis   GERD (gastroesophageal reflux disease) 09/04/2009   ulcerative esophagitis per EGD    H/O kidney transplant 06/19/2012   Hematuria 07/02/2014   History of radiation therapy    Right Breast, left Breast- 05/26/22-07/13/22-Dr. Antony Blackbird   Hypertension    Poor appetite    History   Secondary hyperparathyroidism (HCC)    s/p parathyroidectomy with autotransplantation to left arm per Dr. Fredric Dine on 08/04/11   Streptococcal peritonitis (HCC) 06/06/2011   SVD (spontaneous vaginal delivery)    x 1   Urinary frequency 07/02/2014   UTI (lower urinary tract infection) 07/02/2014   Vaginal discharge 06/04/2014   Past Surgical History:  Procedure Laterality Date   arm graphs  10/05/2006   three - done at [facility]; still in but never used because pt got a transplant.   AXILLARY SENTINEL NODE BIOPSY Left 03/04/2022   Procedure: LEFT AXILLARY SENTINEL NODE BIOPSY;  Surgeon: Emelia Loron, MD;  Location: [facility] OR;  Service: General;  Laterality: Left;   BREAST BIOPSY Right 02/05/2023   MM RT BREAST BX W LOC DEV 1ST LESION IMAGE BX SPEC STEREO GUIDE 02/05/2023  [facility]   BREAST BIOPSY Right 02/12/2023   MM RT BREAST BX W LOC DEV EA AD LESION IMG BX SPEC STEREO GUIDE 02/12/2023 [facility]   BREAST BIOPSY Right 02/12/2023   MM RT BREAST BX W LOC DEV 1ST LESION IMAGE BX SPEC STEREO GUIDE 02/12/2023 [facility]   BREAST LUMPECTOMY WITH RADIOACTIVE SEED LOCALIZATION Bilateral 03/04/2022   Procedure: BILATERAL BRACKETED BREAST LUMPECTOMY WITH RADIOACTIVE SEED LOCALIZATION;  Surgeon: Emelia Loron, MD;  Location: [facility] OR;  Service: General;  Laterality: Bilateral;   BREAST RECONSTRUCTION WITH PLACEMENT OF TISSUE EXPANDER AND FLEX HD (ACELLULAR HYDRATED DERMIS) Right 05/19/2023   Procedure: BREAST RECONSTRUCTION WITH PLACEMENT OF TISSUE EXPANDER AND ACELULAR DERMAL MATRIX;  Surgeon: Allena Napoleon, MD;  Location: [facility] OR;  Service: Plastics;  Laterality: Right;   DILATATION & CURETTAGE/HYSTEROSCOPY WITH TRUECLEAR N/A 04/16/2014   Procedure: DILATATION & CURETTAGE/HYSTEROSCOPY WITH TRUCLEAR AND RESECTION OF FIBROID/THERMACHOICE ABLATION;  Surgeon: Tilda Burrow, MD;  Location: [facility];  Service: Gynecology;  Laterality: N/A;   KIDNEY TRANSPLANT Right 10/06/2011   [facility]   PARATHYROIDECTOMY  08/05/2011   with graft of parathyroid into left upper arm.   RE-EXCISION OF BREAST LUMPECTOMY Left 04/14/2022   Procedure: LEFT BREAST RE-EXCISION LUMPECTOMY;  Surgeon: Emelia Loron, MD;  Location: [facility];  Service: General;  Laterality: Left;  SIMPLE MASTECTOMY WITH AXILLARY SENTINEL NODE BIOPSY Right 05/19/2023   Procedure: RIGHT MASTECTOMY;  Surgeon: Emelia Loron, MD;  Location: [facility] OR;  Service: General;  Laterality: Right;   UMBILICAL HERNIA REPAIR     WISDOM TOOTH EXTRACTION     Patient Active Problem List   Diagnosis Date Noted   S/P mastectomy, right 05/19/2023   Malignant neoplasm of upper-outer quadrant of left breast in female, estrogen receptor positive (HCC) 02/09/2022   Ductal carcinoma in  situ (DCIS) of right breast 02/09/2022   Fibroids 01/06/2018   Abnormal uterine bleeding (AUB) 08/15/2015   Menorrhagia with regular cycle 03/14/2015   Urinary frequency 07/02/2014   Hematuria 07/02/2014   UTI (lower urinary tract infection) 07/02/2014   Vaginal discharge 06/04/2014   BV (bacterial vaginosis) 06/04/2014   Post-operative state 05/02/2014   Submucous uterine fibroid with menorrhagia 02/05/2014   Sepsis (HCC) 11/20/2013   UTI (urinary tract infection) 11/20/2013   Myalgia 11/20/2013   Acute on chronic renal failure (HCC) 11/20/2013   Leukocytosis, unspecified 11/20/2013   Menorrhagia, premenopausal 11/15/2013   Fever 08/30/2013   Acute pyelonephritis 06/29/2013   History of renal transplant 06/29/2013   Other ureteric obstruction 08/11/2012   Lymphocele 07/15/2012   Edema of both legs 07/04/2012   Immunosuppression (HCC) 06/28/2012   Hypocalcemia syndrome 08/19/2011    Class: Acute   Renal failure (ARF), acute on chronic (HCC) 08/19/2011    Class: Acute   HTN (hypertension) 08/19/2011    Class: Acute   GERD (gastroesophageal reflux disease) 08/19/2011    Class: Chronic   Hyperparathyroidism, secondary (HCC) 05/19/2011    PCP: Benita Stabile, MD  REFERRING PROVIDER: Emelia Loron, MD  REFERRING DIAG: [phone] (ICD-10-CM) - Other specified postprocedural states D05.11 (ICD-10-CM) - Intraductal carcinoma in situ of right breast C50.411 (ICD-10-CM) - Malignant neoplasm of upper-outer quadrant of right female breast  THERAPY DIAG:  Stiffness of right shoulder, not elsewhere classified  Aftercare following surgery for neoplasm  Lymphedema, not elsewhere classified  Abnormal posture  Malignant neoplasm of upper-outer quadrant of left breast in female, estrogen receptor positive (HCC)  Malignant neoplasm of upper-outer quadrant of right breast in female, estrogen receptor positive (HCC)  ONSET DATE: 05/19/23  Rationale for Evaluation and Treatment:  Rehabilitation  SUBJECTIVE:                                                                                                                                                                                           SUBJECTIVE STATEMENT: I I did well after last visit.  My ROM doesn't feel as tight.  PERTINENT HISTORY: Patient was diagnosed on 01/26/2022 with  bilateral  breast Cancer with Right  gr. 2 DCIS ER, PR + and left Invasive ductal carcinoma. It measures .6 cm and is located in the upper outer quadrant. It is ER, PR +, Her 2 - with a Ki67 of 1%. She is s/p bilateral breast lumpectomies and left SLNB (0/3) on 03/04/2022.  Had left breast re-excision on 04/14/2022. Diagnosed with recurrence of R DCIS (ER/PR+, HER2-) and underwent a mastectomy and expander reconstruction on 05/19/23. She is also to have radiation and anti estrogen. She is s/p a kidney transplant in 2013   PAIN:  Are you having pain? No, just tightness across chest  PRECAUTIONS: Other: at risk for lymphedema bilaterally  RED FLAGS: None   WEIGHT BEARING RESTRICTIONS: No  FALLS:  Has patient fallen in last 6 months? 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,   LEISURE: pt does not exercise  HAND DOMINANCE: right   PRIOR LEVEL OF FUNCTION: Independent  PATIENT GOALS: to get back to normal   OBJECTIVE:  COGNITION: Overall cognitive status: Within functional limits for tasks assessed   PALPATION: Tightness palpable along R lateral trunk  OBSERVATIONS / OTHER ASSESSMENTS: healing mastectomy scar with expander in place  POSTURE: forward head, rounded shoulders  UPPER EXTREMITY AROM/PROM:  A/PROM RIGHT   eval  RIGHT 06/23/2023  Shoulder extension 66 70  Shoulder flexion 141 173  Shoulder abduction 138 180  Shoulder internal rotation 60 72   Shoulder external rotation 95 115    (Blank rows = not tested)  A/PROM LEFT   eval  Shoulder extension 88  Shoulder flexion 176  Shoulder abduction 180  Shoulder internal rotation 71  Shoulder external rotation 94    (Blank rows = not tested)  UPPER EXTREMITY STRENGTH: 5/5  LYMPHEDEMA ASSESSMENTS:   SURGERY TYPE/DATE: 03/04/22- bilateral lumpectomies and L SLNB, 04/14/22- L breast re excision, 05/19/23- R mastectomy due to recurrence   NUMBER OF LYMPH NODES REMOVED: 0/3  CHEMOTHERAPY: none  RADIATION:completed 07/13/22  HORMONE TREATMENT: currently on Tamoxifen  INFECTIONS: none   LYMPHEDEMA ASSESSMENTS:   LANDMARK RIGHT  eval  At axilla  40.5  15 cm proximal to olecranon process 39.5  10 cm proximal to olecranon process 37.6  Olecranon process 28  15 cm proximal to ulnar styloid process 28.5  10 cm proximal to ulnar styloid process 23.5  Just proximal to ulnar styloid process 17.2  Across hand at thumb web space 20.5  At base of 2nd digit 6.9  (Blank rows = not tested)  LANDMARK LEFT  eval  At axilla  41  15 cm proximal to olecranon process 40.6  10 cm proximal to olecranon process 38.6  Olecranon process 28.3  15 cm proximal to ulnar styloid process 28.1  10 cm proximal to ulnar styloid process 24  Just proximal to ulnar styloid process 18  Across hand at thumb web space 21.2  At base of 2nd digit 7.2  (Blank rows = not tested)   QUICK DASH SURVEY:       TODAY'S TREATMENT:  DATE:   06/23/2023  Therapeutic Exercises Pulleys into flex and abd x 2 mins each Roll yellow ball up wall :Flex and Rt abd x 10  Supine AROM flexion, scaption, horizontal abduction x5 Manual Therapy P/ROM to Rt shoulders into flex, abd and D2 to pts tolerance and with scapular depression throughout STM to Rt pect tendon , Right UT and Rt  lateral trunk along areas of tightness, started in supine then with pt in Lt S/L with cocoa butter, also to medial scapular border Measured Right shoulder AROM  06/21/23: Therapeutic Exercises Pulleys into flex and abd x 2 mins each Roll yellow ball up wall :Flex and Rt abd x 10 Manual Therapy P/ROM to Rt shoulders into flex, abd and D2 to pts tolerance and with scapular depression throughout STM to Rt pect tendon and Rt lateral trunk along areas of tightness, started in supine then with pt in Lt S/L with cocoa butter, also to medial scapular border Scap Mobs to Rt scapula into protraction and retraction  06/16/23: PROM to R shoulder in to flexion and abduction with v/c to relax to allow PROM, pt reports increased comfort afterwards, Issued info to pt to follow up on compression sleeve with [employer] for LUE that she was measured for earlier this year.     PATIENT EDUCATION:  Education details: need for post op PT for improving ROM Person educated: Patient Education method: Explanation Education comprehension: verbalized understanding  HOME EXERCISE PROGRAM: Post op breast exercises  ASSESSMENT:  CLINICAL IMPRESSION: Pt is making excellent progress with ROM and she has no real complaints of pain. Tightness remains in right pectorals and mildly at serratus. Pt should be ready to start some strengthening.  OBJECTIVE IMPAIRMENTS: decreased ROM, decreased strength, increased edema, increased fascial restrictions, impaired UE functional use, postural dysfunction, and pain.   ACTIVITY LIMITATIONS: carrying, lifting, and reach over head  PARTICIPATION LIMITATIONS: meal prep, cleaning, laundry, community activity, occupation, and yard work  PERSONAL FACTORS: 1 comorbidity: hx of lumpectomy and radiation  are also affecting patient's functional outcome.   REHAB POTENTIAL: Good  CLINICAL DECISION MAKING: Stable/uncomplicated  EVALUATION COMPLEXITY: Low  GOALS: Goals reviewed with  patient? Yes  SHORT TERM GOALS=LONG TERM GOALS Target date: 07/14/23  Pt will obtain appropriate compression garments for long term management of LUE lymphedema. Baseline: Goal status: INITIAL  2.  Pt will be independent with self MLD and compression bandaging for LUE. Baseline:  Goal status: INITIAL  3.  Pt will demonstrate 165 degrees of R shoulder flexion to allow her to reach overhead. Baseline:  Goal status: MET 06/23/2023 4.  Pt will demonstrate 165 degrees of R shoulder abduction to allow her to reach out to the side. Baseline:  Goal status:MET, 06/23/2023  5.  Pt will be independent in a home exercise program for continued strengthening and stretching.  Baseline:  Goal status: INITIAL  6.  Pt will report a 50% improvement in pain and discomfort in R upper quadrant to allow improved comfort.  Baseline:  Goal status: INITIAL  PLAN:  PT FREQUENCY: 2x/week  PT DURATION: 4 weeks  PLANNED INTERVENTIONS: Therapeutic exercises, Therapeutic activity, Patient/Family education, Self Care, Joint mobilization, Orthotic/Fit training, Manual lymph drainage, Compression bandaging, scar mobilization, Taping, Vasopneumatic device, Manual therapy, and Re-evaluation  PLAN FOR NEXT SESSION: pulleys, ball, PROM R shoulder, STM R lateral trunk (serratus/lats); initiate strength,assess fit of sleeves when pt brings them in  [employer], PT 06/23/2023, 11:58 AM

## 2023-06-29 ENCOUNTER — Ambulatory Visit: Payer: 59

## 2023-06-30 ENCOUNTER — Telehealth: Payer: Self-pay

## 2023-06-30 NOTE — Telephone Encounter (Signed)
[facility] CSW Progress Note  Clinical Child psychotherapist contacted patient by phone to return missed call. Patient and CSW discussed grant application requirements. CSW sentemail to foundation on patient's behalf to clarify if patient is eligible.     Marguerita Merles, LCSWA Clinical Social Worker [facility]

## 2023-07-06 ENCOUNTER — Ambulatory Visit: Payer: 59 | Attending: General Surgery

## 2023-07-06 DIAGNOSIS — Z483 Aftercare following surgery for neoplasm: Secondary | ICD-10-CM | POA: Diagnosis present

## 2023-07-06 DIAGNOSIS — M25611 Stiffness of right shoulder, not elsewhere classified: Secondary | ICD-10-CM | POA: Insufficient documentation

## 2023-07-06 DIAGNOSIS — Z170 Estrogen receptor positive status [ER+]: Secondary | ICD-10-CM | POA: Insufficient documentation

## 2023-07-06 DIAGNOSIS — R293 Abnormal posture: Secondary | ICD-10-CM | POA: Diagnosis present

## 2023-07-06 DIAGNOSIS — I890 Lymphedema, not elsewhere classified: Secondary | ICD-10-CM | POA: Insufficient documentation

## 2023-07-06 DIAGNOSIS — C50411 Malignant neoplasm of upper-outer quadrant of right female breast: Secondary | ICD-10-CM | POA: Insufficient documentation

## 2023-07-06 DIAGNOSIS — C50412 Malignant neoplasm of upper-outer quadrant of left female breast: Secondary | ICD-10-CM | POA: Diagnosis present

## 2023-07-06 NOTE — Therapy (Addendum)
OUTPATIENT PHYSICAL THERAPY  UPPER EXTREMITY ONCOLOGY TREATMENT  Patient Name: Raven Walker MRN: [id number] DOB:December 23, 1980, 42 y.o., female Today's Date: 07/06/2023  END OF SESSION:  PT End of Session - 07/06/23 1558     Visit Number 4    Number of Visits 12    Date for PT Re-Evaluation 08/03/23    PT Start Time 1600    PT Stop Time 1700    PT Time Calculation (min) 60 min    Activity Tolerance Patient tolerated treatment well    Behavior During Therapy [facility] for tasks assessed/performed             Past Medical History:  Diagnosis Date   Anemia    history   BV (bacterial vaginosis) 06/04/2014   Cancer (HCC) 2024   Right Breast Cancer   Chronic kidney disease 2013   Kidney Transplant   ESRD on peritoneal dialysis (HCC) 10/05/2006   pt had kidney transplant 06/2012 and is no longer on dialysis   GERD (gastroesophageal reflux disease) 09/04/2009   ulcerative esophagitis per EGD    H/O kidney transplant 06/19/2012   Hematuria 07/02/2014   History of radiation therapy    Right Breast, left Breast- 05/26/22-07/13/22-Dr. Antony Blackbird   Hypertension    Poor appetite    History   Secondary hyperparathyroidism (HCC)    s/p parathyroidectomy with autotransplantation to left arm per Dr. Fredric Dine on 08/04/11   Streptococcal peritonitis (HCC) 06/06/2011   SVD (spontaneous vaginal delivery)    x 1   Urinary frequency 07/02/2014   UTI (lower urinary tract infection) 07/02/2014   Vaginal discharge 06/04/2014   Past Surgical History:  Procedure Laterality Date   arm graphs  10/05/2006   three - done at [facility]; still in but never used because pt got a transplant.   AXILLARY SENTINEL NODE BIOPSY Left 03/04/2022   Procedure: LEFT AXILLARY SENTINEL NODE BIOPSY;  Surgeon: Emelia Loron, MD;  Location: [facility] OR;  Service: General;  Laterality: Left;   BREAST BIOPSY Right 02/05/2023   MM RT BREAST BX W LOC DEV 1ST LESION IMAGE BX SPEC STEREO GUIDE 02/05/2023  [facility]   BREAST BIOPSY Right 02/12/2023   MM RT BREAST BX W LOC DEV EA AD LESION IMG BX SPEC STEREO GUIDE 02/12/2023 [facility]   BREAST BIOPSY Right 02/12/2023   MM RT BREAST BX W LOC DEV 1ST LESION IMAGE BX SPEC STEREO GUIDE 02/12/2023 [facility]   BREAST LUMPECTOMY WITH RADIOACTIVE SEED LOCALIZATION Bilateral 03/04/2022   Procedure: BILATERAL BRACKETED BREAST LUMPECTOMY WITH RADIOACTIVE SEED LOCALIZATION;  Surgeon: Emelia Loron, MD;  Location: [facility] OR;  Service: General;  Laterality: Bilateral;   BREAST RECONSTRUCTION WITH PLACEMENT OF TISSUE EXPANDER AND FLEX HD (ACELLULAR HYDRATED DERMIS) Right 05/19/2023   Procedure: BREAST RECONSTRUCTION WITH PLACEMENT OF TISSUE EXPANDER AND ACELULAR DERMAL MATRIX;  Surgeon: Allena Napoleon, MD;  Location: [facility] OR;  Service: Plastics;  Laterality: Right;   DILATATION & CURETTAGE/HYSTEROSCOPY WITH TRUECLEAR N/A 04/16/2014   Procedure: DILATATION & CURETTAGE/HYSTEROSCOPY WITH TRUCLEAR AND RESECTION OF FIBROID/THERMACHOICE ABLATION;  Surgeon: Tilda Burrow, MD;  Location: [facility];  Service: Gynecology;  Laterality: N/A;   KIDNEY TRANSPLANT Right 10/06/2011   [facility]   PARATHYROIDECTOMY  08/05/2011   with graft of parathyroid into left upper arm.   RE-EXCISION OF BREAST LUMPECTOMY Left 04/14/2022   Procedure: LEFT BREAST RE-EXCISION LUMPECTOMY;  Surgeon: Emelia Loron, MD;  Location: [facility];  Service: General;  Laterality: Left;  SIMPLE MASTECTOMY WITH AXILLARY SENTINEL NODE BIOPSY Right 05/19/2023   Procedure: RIGHT MASTECTOMY;  Surgeon: Emelia Loron, MD;  Location: [facility] OR;  Service: General;  Laterality: Right;   UMBILICAL HERNIA REPAIR     WISDOM TOOTH EXTRACTION     Patient Active Problem List   Diagnosis Date Noted   S/P mastectomy, right 05/19/2023   Malignant neoplasm of upper-outer quadrant of left breast in female, estrogen receptor positive (HCC) 02/09/2022   Ductal carcinoma in  situ (DCIS) of right breast 02/09/2022   Fibroids 01/06/2018   Abnormal uterine bleeding (AUB) 08/15/2015   Menorrhagia with regular cycle 03/14/2015   Urinary frequency 07/02/2014   Hematuria 07/02/2014   Lower urinary tract infectious disease 07/02/2014   Vaginal discharge 06/04/2014   BV (bacterial vaginosis) 06/04/2014   Post-operative state 05/02/2014   Submucous uterine fibroid with menorrhagia 02/05/2014   Sepsis (HCC) 11/20/2013   UTI (urinary tract infection) 11/20/2013   Myalgia 11/20/2013   Acute on chronic renal failure (HCC) 11/20/2013   Leukocytosis 11/20/2013   Menorrhagia, premenopausal 11/15/2013   Fever 08/30/2013   Acute pyelonephritis 06/29/2013   History of renal transplant 06/29/2013   Other ureteric obstruction 08/11/2012   Lymphocele 07/15/2012   Edema of both legs 07/04/2012   Immunosuppression (HCC) 06/28/2012   Hypocalcemia syndrome 08/19/2011    Class: Acute   Renal failure (ARF), acute on chronic (HCC) 08/19/2011    Class: Acute   HTN (hypertension) 08/19/2011    Class: Acute   GERD (gastroesophageal reflux disease) 08/19/2011    Class: Chronic   Hyperparathyroidism, secondary (HCC) 05/19/2011    PCP: Benita Stabile, MD  REFERRING PROVIDER: Emelia Loron, MD  REFERRING DIAG: [phone] (ICD-10-CM) - Other specified postprocedural states D05.11 (ICD-10-CM) - Intraductal carcinoma in situ of right breast C50.411 (ICD-10-CM) - Malignant neoplasm of upper-outer quadrant of right female breast  THERAPY DIAG:  Stiffness of right shoulder, not elsewhere classified  Aftercare following surgery for neoplasm  Lymphedema, not elsewhere classified  Abnormal posture  Malignant neoplasm of upper-outer quadrant of left breast in female, estrogen receptor positive (HCC)  Malignant neoplasm of upper-outer quadrant of right breast in female, estrogen receptor positive (HCC)  Lymphedema of breast  ONSET DATE: 05/19/23  Rationale for Evaluation and  Treatment: Rehabilitation  SUBJECTIVE:                                                                                                                                                                                           SUBJECTIVE STATEMENT: I  I am doing well. It doesn't feel the same when I do it at home as it does  when you do it here. I just left [place] to [place] and she wants you to check and see if you see any breast swelling on the left and right. I got 2 compression bras today. She wanted me to have you check today to see if I have breast swelling.   PERTINENT HISTORY: Patient was diagnosed on 01/26/2022 with bilateral  breast Cancer with Right  gr. 2 DCIS ER, PR + and left Invasive ductal carcinoma. It measures .6 cm and is located in the upper outer quadrant. It is ER, PR +, Her 2 - with a Ki67 of 1%. She is s/p bilateral breast lumpectomies and left SLNB (0/3) on 03/04/2022.  Had left breast re-excision on 04/14/2022. Diagnosed with recurrence of R DCIS (ER/PR+, HER2-) and underwent a mastectomy and expander reconstruction on 05/19/23. She is also to have radiation and anti estrogen. She is s/p a kidney transplant in 2013   PAIN:  Are you having pain? No, just tightness across chest  PRECAUTIONS: Other: at risk for lymphedema bilaterally  RED FLAGS: None   WEIGHT BEARING RESTRICTIONS: No  FALLS:  Has patient fallen in last 6 months? 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,   LEISURE: pt does not exercise  HAND DOMINANCE: right   PRIOR LEVEL OF FUNCTION: Independent  PATIENT GOALS: to get back to normal   OBJECTIVE:  COGNITION: Overall cognitive status: Within functional limits for tasks assessed   PALPATION: Tightness palpable along R lateral trunk  OBSERVATIONS / OTHER ASSESSMENTS: healing mastectomy  scar with expander in place 07/06/2023: Left breast observed with significant swelling, enlarged pores, fibrosis at medial breast and slight fibrosis at upper breast, right breast with expander slight enlarged pores medially  POSTURE: forward head, rounded shoulders  UPPER EXTREMITY AROM/PROM:  A/PROM RIGHT   eval  RIGHT 06/23/2023  Shoulder extension 66 70  Shoulder flexion 141 173  Shoulder abduction 138 180  Shoulder internal rotation 60 72  Shoulder external rotation 95 115    (Blank rows = not tested)  A/PROM LEFT   eval  Shoulder extension 88  Shoulder flexion 176  Shoulder abduction 180  Shoulder internal rotation 71  Shoulder external rotation 94    (Blank rows = not tested)  UPPER EXTREMITY STRENGTH: 5/5  LYMPHEDEMA ASSESSMENTS:   SURGERY TYPE/DATE: 03/04/22- bilateral lumpectomies and L SLNB, 04/14/22- L breast re excision, 05/19/23- R mastectomy due to recurrence   NUMBER OF LYMPH NODES REMOVED: 0/3  CHEMOTHERAPY: none  RADIATION:completed 07/13/22  HORMONE TREATMENT: currently on Tamoxifen  INFECTIONS: none   LYMPHEDEMA ASSESSMENTS:   LANDMARK RIGHT  eval  At axilla  40.5  15 cm proximal to olecranon process 39.5  10 cm proximal to olecranon process 37.6  Olecranon process 28  15 cm proximal to ulnar styloid process 28.5  10 cm proximal to ulnar styloid process 23.5  Just proximal to ulnar styloid process 17.2  Across hand at thumb web space 20.5  At base of 2nd digit 6.9  (Blank rows = not tested)  LANDMARK LEFT  eval  At axilla  41  15 cm proximal to olecranon process 40.6  10 cm proximal to olecranon process 38.6  Olecranon process 28.3  15 cm proximal to ulnar styloid process 28.1  10 cm proximal to ulnar styloid process 24  Just proximal to ulnar styloid process  18  Across hand at thumb web space 21.2  At base of 2nd digit 7.2  (Blank rows = not tested) 07/06/2023 CHEST MEASUREMENT UNDER ARMS; 99 cm  QUICK DASH SURVEY:        TODAY'S TREATMENT:                                                                                                                                          DATE:   07/06/2023 Examined bilateral breast; increased edema with enlarged pores left breast with medial fibrosis and peau d'orange and bra imprint on left, mild medial breast swelling noted right breast, still pending fills. Pt has bilateral UE grafts that were never used for dialysis Pulleys x 2 min flexion and abd Supine AROM bilater flexion, scaption , horizontal abd x 5-6 reps ea STM Right pectorals, UT, lateral trunk with cocoa butter PROM Right shoulder flexion, scaption, abd with scapular depression prn In supine: Short neck, 5 diaphragmatic breaths,  L inguinal nodes and establishment of axilloinguinal pathway, then L breast moving fluid towards pathwaysspending extra time in any areas of fibrosis then retracing all steps. Pt awaiting custom sleeve and glove from [facility] in [place] Pt gave permission to send demographics to [facility] [employer]; will make aware she has bilateral grafts in UE never used for dialysis 06/23/2023  Therapeutic Exercises Pulleys into flex and abd x 2 mins each Roll yellow ball up wall :Flex and Rt abd x 10  Supine AROM flexion, scaption, horizontal abduction x5 Manual Therapy P/ROM to Rt shoulders into flex, abd and D2 to pts tolerance and with scapular depression throughout STM to Rt pect tendon , Right UT and Rt lateral trunk along areas of tightness, started in supine then with pt in Lt S/L with cocoa butter, also to medial scapular border Measured Right shoulder AROM  06/21/23: Therapeutic Exercises Pulleys into flex and abd x 2 mins each Roll yellow ball up wall :Flex and Rt abd x 10 Manual Therapy P/ROM to Rt shoulders into flex, abd and D2 to pts tolerance and with scapular depression throughout STM to Rt pect tendon and Rt lateral trunk along areas of tightness,  started in supine then with pt in Lt S/L with cocoa butter, also to medial scapular border Scap Mobs to Rt scapula into protraction and retraction  06/16/23: PROM to R shoulder in to flexion and abduction with v/c to relax to allow PROM, pt reports increased comfort afterwards, Issued info to pt to follow up on compression sleeve with [facility] for LUE that she was measured for earlier this year.     PATIENT EDUCATION:  Education details: need for post op PT for improving ROM Person educated: Patient Education method: Explanation Education comprehension: verbalized understanding  HOME EXERCISE PROGRAM: Post op breast exercises  ASSESSMENT:  CLINICAL IMPRESSION: Tightness remains in right pectorals and mildly at serratus. Pt should be ready to  start some strengthening next visit. Also initiated left breast MLD today and will start instruction to pt . Sent demographics to [facility] re;Flexi touch.  Pt would benefit from a Flexi touch for breast and left UE lymphedema. MD advised that Breast lymphedema on left was added to POC  OBJECTIVE IMPAIRMENTS: decreased ROM, decreased strength, increased edema, increased fascial restrictions, impaired UE functional use, postural dysfunction, and pain.   ACTIVITY LIMITATIONS: carrying, lifting, and reach over head  PARTICIPATION LIMITATIONS: meal prep, cleaning, laundry, community activity, occupation, and yard work  PERSONAL FACTORS: 1 comorbidity: hx of lumpectomy and radiation  are also affecting patient's functional outcome.   REHAB POTENTIAL: Good  CLINICAL DECISION MAKING: Stable/uncomplicated  EVALUATION COMPLEXITY: Low  GOALS: Goals reviewed with patient? Yes  SHORT TERM GOALS=LONG TERM GOALS Target date: 07/14/23  Pt will obtain appropriate compression garments for long term management of LUE lymphedema. Baseline: Goal status: In progress;not yet received  2.  Pt will be independent with self MLD and compression bandaging for  LUE. Baseline:  Goal status: INITIAL  3.  Pt will demonstrate 165 degrees of R shoulder flexion to allow her to reach overhead. Baseline:  Goal status: MET 06/23/2023 4.  Pt will demonstrate 165 degrees of R shoulder abduction to allow her to reach out to the side. Baseline:  Goal status:MET, 06/23/2023  5.  Pt will be independent in a home exercise program for continued strengthening and stretching.  Baseline:  Goal status: In progress  6.  Pt will report a 50% improvement in pain and discomfort in R upper quadrant to allow improved comfort.  Baseline:  Goal status:In Progress 7. Pt will be independent in self MLD to left breast to reduce swelling NEW  PLAN:  PT FREQUENCY: 2x/week  PT DURATION: 4 weeks  PLANNED INTERVENTIONS: Therapeutic exercises, Therapeutic activity, Patient/Family education, Self Care, Joint mobilization, Orthotic/Fit training, Manual lymph drainage, Compression bandaging, scar mobilization, Taping, Vasopneumatic device, Manual therapy, and Re-evaluation  PLAN FOR NEXT SESSION: pulleys, ball, PROM R shoulder, STM R lateral trunk (serratus/lats); initiate strength,assess fit of sleeves when pt brings them in, instruct MLD to left breast swelling, Demographics sent to [facility] with pt approval  Waynette Buttery, PT 07/06/2023, 5:28 PM

## 2023-07-08 ENCOUNTER — Ambulatory Visit: Payer: 59

## 2023-07-08 DIAGNOSIS — M25611 Stiffness of right shoulder, not elsewhere classified: Secondary | ICD-10-CM

## 2023-07-08 DIAGNOSIS — I890 Lymphedema, not elsewhere classified: Secondary | ICD-10-CM

## 2023-07-08 DIAGNOSIS — R293 Abnormal posture: Secondary | ICD-10-CM

## 2023-07-08 DIAGNOSIS — Z483 Aftercare following surgery for neoplasm: Secondary | ICD-10-CM

## 2023-07-08 DIAGNOSIS — Z170 Estrogen receptor positive status [ER+]: Secondary | ICD-10-CM

## 2023-07-08 NOTE — Therapy (Signed)
OUTPATIENT PHYSICAL THERAPY  UPPER EXTREMITY ONCOLOGY TREATMENT  Patient Name: Raven Walker MRN: [id number] DOB:October 26, 1980, 42 y.o., female Today's Date: 07/08/2023  END OF SESSION:  PT End of Session - 07/08/23 1157     Visit Number 5    Number of Visits 12    Date for PT Re-Evaluation 08/03/23    PT Start Time 1200    PT Stop Time 1256    PT Time Calculation (min) 56 min    Activity Tolerance Patient tolerated treatment well    Behavior During Therapy [facility] for tasks assessed/performed             Past Medical History:  Diagnosis Date   Anemia    history   BV (bacterial vaginosis) 06/04/2014   Cancer (HCC) 2024   Right Breast Cancer   Chronic kidney disease 2013   Kidney Transplant   ESRD on peritoneal dialysis (HCC) 10/05/2006   pt had kidney transplant 06/2012 and is no longer on dialysis   GERD (gastroesophageal reflux disease) 09/04/2009   ulcerative esophagitis per EGD    H/O kidney transplant 06/19/2012   Hematuria 07/02/2014   History of radiation therapy    Right Breast, left Breast- 05/26/22-07/13/22-Dr. Antony Blackbird   Hypertension    Poor appetite    History   Secondary hyperparathyroidism (HCC)    s/p parathyroidectomy with autotransplantation to left arm per Dr. Fredric Dine on 08/04/11   Streptococcal peritonitis (HCC) 06/06/2011   SVD (spontaneous vaginal delivery)    x 1   Urinary frequency 07/02/2014   UTI (lower urinary tract infection) 07/02/2014   Vaginal discharge 06/04/2014   Past Surgical History:  Procedure Laterality Date   arm graphs  10/05/2006   three - done at [facility]; still in but never used because pt got a transplant.   AXILLARY SENTINEL NODE BIOPSY Left 03/04/2022   Procedure: LEFT AXILLARY SENTINEL NODE BIOPSY;  Surgeon: Emelia Loron, MD;  Location: [facility] OR;  Service: General;  Laterality: Left;   BREAST BIOPSY Right 02/05/2023   MM RT BREAST BX W LOC DEV 1ST LESION IMAGE BX SPEC STEREO GUIDE 02/05/2023  [facility]   BREAST BIOPSY Right 02/12/2023   MM RT BREAST BX W LOC DEV EA AD LESION IMG BX SPEC STEREO GUIDE 02/12/2023 [facility]   BREAST BIOPSY Right 02/12/2023   MM RT BREAST BX W LOC DEV 1ST LESION IMAGE BX SPEC STEREO GUIDE 02/12/2023 [facility]   BREAST LUMPECTOMY WITH RADIOACTIVE SEED LOCALIZATION Bilateral 03/04/2022   Procedure: BILATERAL BRACKETED BREAST LUMPECTOMY WITH RADIOACTIVE SEED LOCALIZATION;  Surgeon: Emelia Loron, MD;  Location: [facility] OR;  Service: General;  Laterality: Bilateral;   BREAST RECONSTRUCTION WITH PLACEMENT OF TISSUE EXPANDER AND FLEX HD (ACELLULAR HYDRATED DERMIS) Right 05/19/2023   Procedure: BREAST RECONSTRUCTION WITH PLACEMENT OF TISSUE EXPANDER AND ACELULAR DERMAL MATRIX;  Surgeon: Allena Napoleon, MD;  Location: [facility] OR;  Service: Plastics;  Laterality: Right;   DILATATION & CURETTAGE/HYSTEROSCOPY WITH TRUECLEAR N/A 04/16/2014   Procedure: DILATATION & CURETTAGE/HYSTEROSCOPY WITH TRUCLEAR AND RESECTION OF FIBROID/THERMACHOICE ABLATION;  Surgeon: Tilda Burrow, MD;  Location: [facility] ORS;  Service: Gynecology;  Laterality: N/A;   KIDNEY TRANSPLANT Right 10/06/2011   [facility]   PARATHYROIDECTOMY  08/05/2011   with graft of parathyroid into left upper arm.   RE-EXCISION OF BREAST LUMPECTOMY Left 04/14/2022   Procedure: LEFT BREAST RE-EXCISION LUMPECTOMY;  Surgeon: Emelia Loron, MD;  Location:  SURGERY CENTER;  Service: General;  Laterality: Left;  SIMPLE MASTECTOMY WITH AXILLARY SENTINEL NODE BIOPSY Right 05/19/2023   Procedure: RIGHT MASTECTOMY;  Surgeon: Emelia Loron, MD;  Location: [facility] OR;  Service: General;  Laterality: Right;   UMBILICAL HERNIA REPAIR     WISDOM TOOTH EXTRACTION     Patient Active Problem List   Diagnosis Date Noted   S/P mastectomy, right 05/19/2023   Malignant neoplasm of upper-outer quadrant of left breast in female, estrogen receptor positive (HCC) 02/09/2022   Ductal carcinoma in  situ (DCIS) of right breast 02/09/2022   Fibroids 01/06/2018   Abnormal uterine bleeding (AUB) 08/15/2015   Menorrhagia with regular cycle 03/14/2015   Urinary frequency 07/02/2014   Hematuria 07/02/2014   Lower urinary tract infectious disease 07/02/2014   Vaginal discharge 06/04/2014   BV (bacterial vaginosis) 06/04/2014   Post-operative state 05/02/2014   Submucous uterine fibroid with menorrhagia 02/05/2014   Sepsis (HCC) 11/20/2013   UTI (urinary tract infection) 11/20/2013   Myalgia 11/20/2013   Acute on chronic renal failure (HCC) 11/20/2013   Leukocytosis 11/20/2013   Menorrhagia, premenopausal 11/15/2013   Fever 08/30/2013   Acute pyelonephritis 06/29/2013   History of renal transplant 06/29/2013   Other ureteric obstruction 08/11/2012   Lymphocele 07/15/2012   Edema of both legs 07/04/2012   Immunosuppression (HCC) 06/28/2012   Hypocalcemia syndrome 08/19/2011    Class: Acute   Renal failure (ARF), acute on chronic (HCC) 08/19/2011    Class: Acute   HTN (hypertension) 08/19/2011    Class: Acute   GERD (gastroesophageal reflux disease) 08/19/2011    Class: Chronic   Hyperparathyroidism, secondary (HCC) 05/19/2011    PCP: Benita Stabile, MD  REFERRING PROVIDER: Emelia Loron, MD  REFERRING DIAG: [phone] (ICD-10-CM) - Other specified postprocedural states D05.11 (ICD-10-CM) - Intraductal carcinoma in situ of right breast C50.411 (ICD-10-CM) - Malignant neoplasm of upper-outer quadrant of right female breast  THERAPY DIAG:  Stiffness of right shoulder, not elsewhere classified  Aftercare following surgery for neoplasm  Lymphedema, not elsewhere classified  Abnormal posture  Malignant neoplasm of upper-outer quadrant of left breast in female, estrogen receptor positive (HCC)  Malignant neoplasm of upper-outer quadrant of right breast in female, estrogen receptor positive (HCC)  Lymphedema of breast  ONSET DATE: 05/19/23  Rationale for Evaluation and  Treatment: Rehabilitation  SUBJECTIVE:                                                                                                                                                                                           SUBJECTIVE STATEMENT: I  No word about my sleeve. Tightness more at right lateral trunk and a little tender at right chest.  Have not heard from [facility] yet.   PERTINENT HISTORY: Patient was diagnosed on 01/26/2022 with bilateral  breast Cancer with Right  gr. 2 DCIS ER, PR + and left Invasive ductal carcinoma. It measures .6 cm and is located in the upper outer quadrant. It is ER, PR +, Her 2 - with a Ki67 of 1%. She is s/p bilateral breast lumpectomies and left SLNB (0/3) on 03/04/2022.  Had left breast re-excision on 04/14/2022. Diagnosed with recurrence of R DCIS (ER/PR+, HER2-) and underwent a mastectomy and expander reconstruction on 05/19/23. She is also to have radiation and anti estrogen. She is s/p a kidney transplant in 2013   PAIN:  Are you having pain? No, just tightness across chest  PRECAUTIONS: Other: at risk for lymphedema bilaterally  RED FLAGS: None   WEIGHT BEARING RESTRICTIONS: No  FALLS:  Has patient fallen in last 6 months? 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,   LEISURE: pt does not exercise  HAND DOMINANCE: right   PRIOR LEVEL OF FUNCTION: Independent  PATIENT GOALS: to get back to normal   OBJECTIVE:  COGNITION: Overall cognitive status: Within functional limits for tasks assessed   PALPATION: Tightness palpable along R lateral trunk  OBSERVATIONS / OTHER ASSESSMENTS: healing mastectomy scar with expander in place 07/06/2023: Left breast observed with significant swelling, enlarged pores, fibrosis at medial breast and slight fibrosis at upper breast, right  breast with expander slight enlarged pores medially  POSTURE: forward head, rounded shoulders  UPPER EXTREMITY AROM/PROM:  A/PROM RIGHT   eval  RIGHT 06/23/2023  Shoulder extension 66 70  Shoulder flexion 141 173  Shoulder abduction 138 180  Shoulder internal rotation 60 72  Shoulder external rotation 95 115    (Blank rows = not tested)  A/PROM LEFT   eval  Shoulder extension 88  Shoulder flexion 176  Shoulder abduction 180  Shoulder internal rotation 71  Shoulder external rotation 94    (Blank rows = not tested)  UPPER EXTREMITY STRENGTH: 5/5  LYMPHEDEMA ASSESSMENTS:   SURGERY TYPE/DATE: 03/04/22- bilateral lumpectomies and L SLNB, 04/14/22- L breast re excision, 05/19/23- R mastectomy due to recurrence   NUMBER OF LYMPH NODES REMOVED: 0/3  CHEMOTHERAPY: none  RADIATION:completed 07/13/22  HORMONE TREATMENT: currently on Tamoxifen  INFECTIONS: none   LYMPHEDEMA ASSESSMENTS:   LANDMARK RIGHT  eval  At axilla  40.5  15 cm proximal to olecranon process 39.5  10 cm proximal to olecranon process 37.6  Olecranon process 28  15 cm proximal to ulnar styloid process 28.5  10 cm proximal to ulnar styloid process 23.5  Just proximal to ulnar styloid process 17.2  Across hand at thumb web space 20.5  At base of 2nd digit 6.9  (Blank rows = not tested)  LANDMARK LEFT  eval  At axilla  41  15 cm proximal to olecranon process 40.6  10 cm proximal to olecranon process 38.6  Olecranon process 28.3  15 cm proximal to ulnar styloid process 28.1  10 cm proximal to ulnar styloid process 24  Just proximal to ulnar styloid process 18  Across hand at thumb web space 21.2  At base of 2nd digit 7.2  (Blank rows = not tested) 07/06/2023 CHEST MEASUREMENT UNDER ARMS; 99 cm  QUICK DASH SURVEY:       TODAY'S TREATMENT:  DATE:   07/08/2023  Standing wall arc x 4, left SB stretch Supine scapular series yellow x 5 flexion and scaption, x 10 horizontal abd, bilateral ER x 10 and sword x 5 ea In supine: Short neck, 5 diaphragmatic breaths,  L inguinal nodes and establishment of axilloinguinal pathway, then L breast moving fluid towards pathwaysspending extra time in any areas of fibrosis then retracing all steps and ending with LN's. Educated pt while performing Photo taken of left nipple and placed in chart; skin crack in middle with area of white inside. May be lotion but sent to [facility] An[facility] NP Updated HEP with yellow band and pictures of all 07/06/2023 Examined bilateral breast; increased edema with enlarged pores left breast with medial fibrosis and peau d'orange and bra imprint on left, mild medial breast swelling noted right breast, still pending fills. Pt has bilateral UE grafts that were never used for dialysis Pulleys x 2 min flexion and abd Supine AROM bilater flexion, scaption , horizontal abd x 5-6 reps ea STM Right pectorals, UT, lateral trunk with cocoa butter PROM Right shoulder flexion, scaption, abd with scapular depression prn In supine: Short neck, 5 diaphragmatic breaths,  L inguinal nodes and establishment of axilloinguinal pathway, then L breast moving fluid towards pathwaysspending extra time in any areas of fibrosis then retracing all steps. Pt awaiting custom sleeve and glove from [facility] in [place] Pt gave permission to send demographics to [facility]; will make aware she has bilateral grafts in UE never used for dialysis 06/23/2023  Therapeutic Exercises Pulleys into flex and abd x 2 mins each Roll yellow ball up wall :Flex and Rt abd x 10  Supine AROM flexion, scaption, horizontal abduction x5 Manual Therapy P/ROM to Rt shoulders into flex, abd and D2 to pts tolerance and with scapular depression throughout STM to Rt pect tendon , Right UT and Rt lateral trunk along  areas of tightness, started in supine then with pt in Lt S/L with cocoa butter, also to medial scapular border Measured Right shoulder AROM  06/21/23: Therapeutic Exercises Pulleys into flex and abd x 2 mins each Roll yellow ball up wall :Flex and Rt abd x 10 Manual Therapy P/ROM to Rt shoulders into flex, abd and D2 to pts tolerance and with scapular depression throughout STM to Rt pect tendon and Rt lateral trunk along areas of tightness, started in supine then with pt in Lt S/L with cocoa butter, also to medial scapular border Scap Mobs to Rt scapula into protraction and retraction  06/16/23: PROM to R shoulder in to flexion and abduction with v/c to relax to allow PROM, pt reports increased comfort afterwards, Issued info to pt to follow up on compression sleeve with [facility] for LUE that she was measured for earlier this year.     PATIENT EDUCATION:  Education details: need for post op PT for improving ROM Person educated: Patient Education method: Explanation Education comprehension: verbalized understanding  HOME EXERCISE PROGRAM: Post op breast exercises  ASSESSMENT:  CLINICAL IMPRESSION: Pt was educated in supine scapular series today and used very good form. HEP was updated. Continued MLD to left breast using left axillo-inguinal pathway while instructing patient. Pt has still not received her compression garment.  OBJECTIVE IMPAIRMENTS: decreased ROM, decreased strength, increased edema, increased fascial restrictions, impaired UE functional use, postural dysfunction, and pain.   ACTIVITY LIMITATIONS: carrying, lifting, and reach over head  PARTICIPATION LIMITATIONS: meal prep, cleaning, laundry, community activity, occupation, and yard work  PERSONAL FACTORS: 1  comorbidity: hx of lumpectomy and radiation  are also affecting patient's functional outcome.   REHAB POTENTIAL: Good  CLINICAL DECISION MAKING: Stable/uncomplicated  EVALUATION COMPLEXITY:  Low  GOALS: Goals reviewed with patient? Yes  SHORT TERM GOALS=LONG TERM GOALS Target date: 07/14/23  Pt will obtain appropriate compression garments for long term management of LUE lymphedema. Baseline: Goal status: In progress;not yet received  2.  Pt will be independent with self MLD and compression bandaging for LUE. Baseline:  Goal status: INITIAL  3.  Pt will demonstrate 165 degrees of R shoulder flexion to allow her to reach overhead. Baseline:  Goal status: MET 06/23/2023 4.  Pt will demonstrate 165 degrees of R shoulder abduction to allow her to reach out to the side. Baseline:  Goal status:MET, 06/23/2023  5.  Pt will be independent in a home exercise program for continued strengthening and stretching.  Baseline:  Goal status: In progress  6.  Pt will report a 50% improvement in pain and discomfort in R upper quadrant to allow improved comfort.  Baseline:  Goal status:In Progress 7. Pt will be independent in self MLD to left breast to reduce swelling NEW  PLAN:  PT FREQUENCY: 2x/week  PT DURATION: 4 weeks  PLANNED INTERVENTIONS: Therapeutic exercises, Therapeutic activity, Patient/Family education, Self Care, Joint mobilization, Orthotic/Fit training, Manual lymph drainage, Compression bandaging, scar mobilization, Taping, Vasopneumatic device, Manual therapy, and Re-evaluation  PLAN FOR NEXT SESSION: pulleys, ball, PROM R shoulder, STM R lateral trunk (serratus/lats); initiate strength,assess fit of sleeves when pt brings them in, instruct MLD to left breast swelling/left UE Demographics sent to [facility] with pt approval  Waynette Buttery, PT 07/08/2023, 1:04 PM

## 2023-07-08 NOTE — Patient Instructions (Signed)

## 2023-07-13 ENCOUNTER — Ambulatory Visit: Payer: 59

## 2023-07-13 DIAGNOSIS — M25611 Stiffness of right shoulder, not elsewhere classified: Secondary | ICD-10-CM

## 2023-07-13 DIAGNOSIS — C50412 Malignant neoplasm of upper-outer quadrant of left female breast: Secondary | ICD-10-CM

## 2023-07-13 DIAGNOSIS — Z483 Aftercare following surgery for neoplasm: Secondary | ICD-10-CM

## 2023-07-13 DIAGNOSIS — R293 Abnormal posture: Secondary | ICD-10-CM

## 2023-07-13 DIAGNOSIS — I890 Lymphedema, not elsewhere classified: Secondary | ICD-10-CM

## 2023-07-13 DIAGNOSIS — C50411 Malignant neoplasm of upper-outer quadrant of right female breast: Secondary | ICD-10-CM

## 2023-07-13 DIAGNOSIS — Z170 Estrogen receptor positive status [ER+]: Secondary | ICD-10-CM

## 2023-07-13 NOTE — Therapy (Signed)
OUTPATIENT PHYSICAL THERAPY  UPPER EXTREMITY ONCOLOGY TREATMENT  Patient Name: Raven Walker MRN: [id number] DOB:08-14-1981, 42 y.o., female Today's Date: 07/13/2023  END OF SESSION:  PT End of Session - 07/13/23 1208     Visit Number 6    Number of Visits 12    Date for PT Re-Evaluation 08/03/23    PT Start Time 1208    PT Stop Time 1305    PT Time Calculation (min) 57 min    Activity Tolerance Patient tolerated treatment well    Behavior During Therapy [facility] for tasks assessed/performed             Past Medical History:  Diagnosis Date   Anemia    history   BV (bacterial vaginosis) 06/04/2014   Cancer (HCC) 2024   Right Breast Cancer   Chronic kidney disease 2013   Kidney Transplant   ESRD on peritoneal dialysis (HCC) 10/05/2006   pt had kidney transplant 06/2012 and is no longer on dialysis   GERD (gastroesophageal reflux disease) 09/04/2009   ulcerative esophagitis per EGD    H/O kidney transplant 06/19/2012   Hematuria 07/02/2014   History of radiation therapy    Right Breast, left Breast- 05/26/22-07/13/22-Dr. Antony Blackbird   Hypertension    Poor appetite    History   Secondary hyperparathyroidism (HCC)    s/p parathyroidectomy with autotransplantation to left arm per Dr. Fredric Dine on 08/04/11   Streptococcal peritonitis (HCC) 06/06/2011   SVD (spontaneous vaginal delivery)    x 1   Urinary frequency 07/02/2014   UTI (lower urinary tract infection) 07/02/2014   Vaginal discharge 06/04/2014   Past Surgical History:  Procedure Laterality Date   arm graphs  10/05/2006   three - done at [facility]; still in but never used because pt got a transplant.   AXILLARY SENTINEL NODE BIOPSY Left 03/04/2022   Procedure: LEFT AXILLARY SENTINEL NODE BIOPSY;  Surgeon: Emelia Loron, MD;  Location: [facility] OR;  Service: General;  Laterality: Left;   BREAST BIOPSY Right 02/05/2023   MM RT BREAST BX W LOC DEV 1ST LESION IMAGE BX SPEC STEREO GUIDE 02/05/2023  [facility]   BREAST BIOPSY Right 02/12/2023   MM RT BREAST BX W LOC DEV EA AD LESION IMG BX SPEC STEREO GUIDE 02/12/2023 [facility]   BREAST BIOPSY Right 02/12/2023   MM RT BREAST BX W LOC DEV 1ST LESION IMAGE BX SPEC STEREO GUIDE 02/12/2023 [facility]   BREAST LUMPECTOMY WITH RADIOACTIVE SEED LOCALIZATION Bilateral 03/04/2022   Procedure: BILATERAL BRACKETED BREAST LUMPECTOMY WITH RADIOACTIVE SEED LOCALIZATION;  Surgeon: Emelia Loron, MD;  Location: [facility] OR;  Service: General;  Laterality: Bilateral;   BREAST RECONSTRUCTION WITH PLACEMENT OF TISSUE EXPANDER AND FLEX HD (ACELLULAR HYDRATED DERMIS) Right 05/19/2023   Procedure: BREAST RECONSTRUCTION WITH PLACEMENT OF TISSUE EXPANDER AND ACELULAR DERMAL MATRIX;  Surgeon: Allena Napoleon, MD;  Location: [facility] OR;  Service: Plastics;  Laterality: Right;   DILATATION & CURETTAGE/HYSTEROSCOPY WITH TRUECLEAR N/A 04/16/2014   Procedure: DILATATION & CURETTAGE/HYSTEROSCOPY WITH TRUCLEAR AND RESECTION OF FIBROID/THERMACHOICE ABLATION;  Surgeon: Tilda Burrow, MD;  Location: [facility];  Service: Gynecology;  Laterality: N/A;   KIDNEY TRANSPLANT Right 10/06/2011   [facility]   PARATHYROIDECTOMY  08/05/2011   with graft of parathyroid into left upper arm.   RE-EXCISION OF BREAST LUMPECTOMY Left 04/14/2022   Procedure: LEFT BREAST RE-EXCISION LUMPECTOMY;  Surgeon: Emelia Loron, MD;  Location: [facility];  Service: General;  Laterality: Left;  SIMPLE MASTECTOMY WITH AXILLARY SENTINEL NODE BIOPSY Right 05/19/2023   Procedure: RIGHT MASTECTOMY;  Surgeon: Emelia Loron, MD;  Location: [facility] OR;  Service: General;  Laterality: Right;   UMBILICAL HERNIA REPAIR     WISDOM TOOTH EXTRACTION     Patient Active Problem List   Diagnosis Date Noted   S/P mastectomy, right 05/19/2023   Malignant neoplasm of upper-outer quadrant of left breast in female, estrogen receptor positive (HCC) 02/09/2022   Ductal carcinoma in  situ (DCIS) of right breast 02/09/2022   Fibroids 01/06/2018   Abnormal uterine bleeding (AUB) 08/15/2015   Menorrhagia with regular cycle 03/14/2015   Urinary frequency 07/02/2014   Hematuria 07/02/2014   Lower urinary tract infectious disease 07/02/2014   Vaginal discharge 06/04/2014   BV (bacterial vaginosis) 06/04/2014   Post-operative state 05/02/2014   Submucous uterine fibroid with menorrhagia 02/05/2014   Sepsis (HCC) 11/20/2013   UTI (urinary tract infection) 11/20/2013   Myalgia 11/20/2013   Acute on chronic renal failure (HCC) 11/20/2013   Leukocytosis 11/20/2013   Menorrhagia, premenopausal 11/15/2013   Fever 08/30/2013   Acute pyelonephritis 06/29/2013   History of renal transplant 06/29/2013   Other ureteric obstruction 08/11/2012   Lymphocele 07/15/2012   Edema of both legs 07/04/2012   Immunosuppression (HCC) 06/28/2012   Hypocalcemia syndrome 08/19/2011    Class: Acute   Renal failure (ARF), acute on chronic (HCC) 08/19/2011    Class: Acute   HTN (hypertension) 08/19/2011    Class: Acute   GERD (gastroesophageal reflux disease) 08/19/2011    Class: Chronic   Hyperparathyroidism, secondary (HCC) 05/19/2011    PCP: Benita Stabile, MD  REFERRING PROVIDER: Emelia Loron, MD  REFERRING DIAG: [phone] (ICD-10-CM) - Other specified postprocedural states D05.11 (ICD-10-CM) - Intraductal carcinoma in situ of right breast C50.411 (ICD-10-CM) - Malignant neoplasm of upper-outer quadrant of right female breast  THERAPY DIAG:  Stiffness of right shoulder, not elsewhere classified  Aftercare following surgery for neoplasm  Lymphedema, not elsewhere classified  Abnormal posture  Malignant neoplasm of upper-outer quadrant of left breast in female, estrogen receptor positive (HCC)  Malignant neoplasm of upper-outer quadrant of right breast in female, estrogen receptor positive (HCC)  Lymphedema of breast  ONSET DATE: 05/19/23  Rationale for Evaluation and  Treatment: Rehabilitation  SUBJECTIVE:                                                                                                                                                                                           SUBJECTIVE STATEMENT: I  I just saw Dr. Arita Miss and he put in 100 cc today, No sleeve yet. I can tell  my left breast has reduced some. The pad you gave me seemed to help. I think the white in the nipple was lotion.   PERTINENT HISTORY: Patient was diagnosed on 01/26/2022 with bilateral  breast Cancer with Right  gr. 2 DCIS ER, PR + and left Invasive ductal carcinoma. It measures .6 cm and is located in the upper outer quadrant. It is ER, PR +, Her 2 - with a Ki67 of 1%. She is s/p bilateral breast lumpectomies and left SLNB (0/3) on 03/04/2022.  Had left breast re-excision on 04/14/2022. Diagnosed with recurrence of R DCIS (ER/PR+, HER2-) and underwent a mastectomy and expander reconstruction on 05/19/23. She is also to have radiation and anti estrogen. She is s/p a kidney transplant in 2013   PAIN:  Are you having pain? No, just tightness across chest  PRECAUTIONS: Other: at risk for lymphedema bilaterally  RED FLAGS: None   WEIGHT BEARING RESTRICTIONS: No  FALLS:  Has patient fallen in last 6 months? 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,   LEISURE: pt does not exercise  HAND DOMINANCE: right   PRIOR LEVEL OF FUNCTION: Independent  PATIENT GOALS: to get back to normal   OBJECTIVE:  COGNITION: Overall cognitive status: Within functional limits for tasks assessed   PALPATION: Tightness palpable along R lateral trunk  OBSERVATIONS / OTHER ASSESSMENTS: healing mastectomy scar with expander in place 07/06/2023: Left breast observed with significant swelling, enlarged pores, fibrosis at medial breast  and slight fibrosis at upper breast, right breast with expander slight enlarged pores medially  POSTURE: forward head, rounded shoulders  UPPER EXTREMITY AROM/PROM:  A/PROM RIGHT   eval  RIGHT 06/23/2023  Shoulder extension 66 70  Shoulder flexion 141 173  Shoulder abduction 138 180  Shoulder internal rotation 60 72  Shoulder external rotation 95 115    (Blank rows = not tested)  A/PROM LEFT   eval  Shoulder extension 88  Shoulder flexion 176  Shoulder abduction 180  Shoulder internal rotation 71  Shoulder external rotation 94    (Blank rows = not tested)  UPPER EXTREMITY STRENGTH: 5/5  LYMPHEDEMA ASSESSMENTS:   SURGERY TYPE/DATE: 03/04/22- bilateral lumpectomies and L SLNB, 04/14/22- L breast re excision, 05/19/23- R mastectomy due to recurrence   NUMBER OF LYMPH NODES REMOVED: 0/3  CHEMOTHERAPY: none  RADIATION:completed 07/13/22  HORMONE TREATMENT: currently on Tamoxifen  INFECTIONS: none   LYMPHEDEMA ASSESSMENTS:   LANDMARK RIGHT  eval  At axilla  40.5  15 cm proximal to olecranon process 39.5  10 cm proximal to olecranon process 37.6  Olecranon process 28  15 cm proximal to ulnar styloid process 28.5  10 cm proximal to ulnar styloid process 23.5  Just proximal to ulnar styloid process 17.2  Across hand at thumb web space 20.5  At base of 2nd digit 6.9  (Blank rows = not tested)  LANDMARK LEFT  eval  At axilla  41  15 cm proximal to olecranon process 40.6  10 cm proximal to olecranon process 38.6  Olecranon process 28.3  15 cm proximal to ulnar styloid process 28.1  10 cm proximal to ulnar styloid process 24  Just proximal to ulnar styloid process 18  Across hand at thumb web space 21.2  At base of 2nd digit 7.2  (Blank rows = not tested) 07/06/2023 CHEST MEASUREMENT UNDER ARMS; 99  cm  QUICK DASH SURVEY:       TODAY'S TREATMENT:                                                                                                                                            DATE:  07/13/2023 Discussed flexitouch and gave pt information so she can call insurance Company. Supine scapular series x 10 flexion, ER, horizontal abd and sword Bil In supine: Short neck, 5 diaphragmatic breaths,  Left  axillary and left inguinal nodes and establishment of axilloinguinal pathway, then L breast moving fluid towards pathway spending extra time in any areas of fibrosis then retracing all steps and ending with LN's. Educated pt while performing and pt performed all steps with multiple VC's and TC's required initially, and then doing very well Made small chip pack to place in bra for medial fibrosis  07/08/2023  Standing wall arc x 4, left SB stretch Supine scapular series yellow x 5 flexion and scaption, x 10 horizontal abd, bilateral ER x 10 and sword x 5 ea In supine: Short neck, 5 diaphragmatic breaths,  L inguinal nodes and establishment of axilloinguinal pathway, then L breast moving fluid towards pathwaysspending extra time in any areas of fibrosis then retracing all steps and ending with LN's. Educated pt while performing Photo taken of left nipple and placed in chart; skin crack in middle with area of white inside. May be lotion but sent to [facility] NP Updated HEP with yellow band and pictures of all 07/06/2023 Examined bilateral breast; increased edema with enlarged pores left breast with medial fibrosis and peau d'orange and bra imprint on left, mild medial breast swelling noted right breast, still pending fills. Pt has bilateral UE grafts that were never used for dialysis Pulleys x 2 min flexion and abd Supine AROM bilater flexion, scaption , horizontal abd x 5-6 reps ea STM Right pectorals, UT, lateral trunk with cocoa butter PROM Right shoulder flexion, scaption, abd with scapular depression prn In supine: Short neck, 5 diaphragmatic breaths,  L inguinal nodes and establishment of axilloinguinal pathway, then L breast moving fluid towards  pathwaysspending extra time in any areas of fibrosis then retracing all steps. Pt awaiting custom sleeve and glove from [facility] in [place] Pt gave permission to send demographics to [facility]; will make aware she has bilateral grafts in UE never used for dialysis 06/23/2023  Therapeutic Exercises Pulleys into flex and abd x 2 mins each Roll yellow ball up wall :Flex and Rt abd x 10  Supine AROM flexion, scaption, horizontal abduction x5 Manual Therapy P/ROM to Rt shoulders into flex, abd and D2 to pts tolerance and with scapular depression throughout STM to Rt pect tendon , Right UT and Rt lateral trunk along areas of tightness, started in supine then with pt in Lt S/L with cocoa butter, also to medial scapular border Measured Right  shoulder AROM  06/21/23: Therapeutic Exercises Pulleys into flex and abd x 2 mins each Roll yellow ball up wall :Flex and Rt abd x 10 Manual Therapy P/ROM to Rt shoulders into flex, abd and D2 to pts tolerance and with scapular depression throughout STM to Rt pect tendon and Rt lateral trunk along areas of tightness, started in supine then with pt in Lt S/L with cocoa butter, also to medial scapular border Scap Mobs to Rt scapula into protraction and retraction  06/16/23: PROM to R shoulder in to flexion and abduction with v/c to relax to allow PROM, pt reports increased comfort afterwards, Issued info to pt to follow up on compression sleeve with [facility] for LUE that she was measured for earlier this year.     PATIENT EDUCATION:  Education details: need for post op PT for improving ROM Person educated: Patient Education method: Explanation Education comprehension: verbalized understanding  HOME EXERCISE PROGRAM: Post op breast exercises  ASSESSMENT:  CLINICAL IMPRESSION: Pt is going to call her insurance company about the Flexitouch and was given info to talk with them about. Continued MLD to left breast  directing to left  axillo-inguinal pathway and instructed pt in same. After practicing she did very well using appropriate pressure and stretch. Medial fibrotic area of breast much softer.after MLD  OBJECTIVE IMPAIRMENTS: decreased ROM, decreased strength, increased edema, increased fascial restrictions, impaired UE functional use, postural dysfunction, and pain.   ACTIVITY LIMITATIONS: carrying, lifting, and reach over head  PARTICIPATION LIMITATIONS: meal prep, cleaning, laundry, community activity, occupation, and yard work  PERSONAL FACTORS: 1 comorbidity: hx of lumpectomy and radiation  are also affecting patient's functional outcome.   REHAB POTENTIAL: Good  CLINICAL DECISION MAKING: Stable/uncomplicated  EVALUATION COMPLEXITY: Low  GOALS: Goals reviewed with patient? Yes  SHORT TERM GOALS=LONG TERM GOALS Target date: 07/14/23  Pt will obtain appropriate compression garments for long term management of LUE lymphedema. Baseline: Goal status: In progress;not yet received  2.  Pt will be independent with self MLD and compression bandaging for LUE. Baseline:  Goal status: INITIAL  3.  Pt will demonstrate 165 degrees of R shoulder flexion to allow her to reach overhead. Baseline:  Goal status: MET 06/23/2023 4.  Pt will demonstrate 165 degrees of R shoulder abduction to allow her to reach out to the side. Baseline:  Goal status:MET, 06/23/2023  5.  Pt will be independent in a home exercise program for continued strengthening and stretching.  Baseline:  Goal status: In progress  6.  Pt will report a 50% improvement in pain and discomfort in R upper quadrant to allow improved comfort.  Baseline:  Goal status:In Progress 7. Pt will be independent in self MLD to left breast to reduce swelling NEW  PLAN:  PT FREQUENCY: 2x/week  PT DURATION: 4 weeks  PLANNED INTERVENTIONS: Therapeutic exercises, Therapeutic activity, Patient/Family education, Self Care, Joint mobilization, Orthotic/Fit  training, Manual lymph drainage, Compression bandaging, scar mobilization, Taping, Vasopneumatic device, Manual therapy, and Re-evaluation  PLAN FOR NEXT SESSION: pulleys, ball, PROM R shoulder, STM R lateral trunk (serratus/lats); initiate strength,assess fit of sleeves when pt brings them in, instruct MLD to left breast swelling/left UE Demographics sent to [facility] with pt approval  Waynette Buttery, PT 07/13/2023, 1:13 PM

## 2023-07-15 ENCOUNTER — Ambulatory Visit: Payer: 59

## 2023-07-15 DIAGNOSIS — R293 Abnormal posture: Secondary | ICD-10-CM

## 2023-07-15 DIAGNOSIS — I890 Lymphedema, not elsewhere classified: Secondary | ICD-10-CM

## 2023-07-15 DIAGNOSIS — M25611 Stiffness of right shoulder, not elsewhere classified: Secondary | ICD-10-CM

## 2023-07-15 DIAGNOSIS — Z483 Aftercare following surgery for neoplasm: Secondary | ICD-10-CM

## 2023-07-15 DIAGNOSIS — Z170 Estrogen receptor positive status [ER+]: Secondary | ICD-10-CM
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
## 2023-07-20 ENCOUNTER — Ambulatory Visit: Payer: 59

## 2023-07-22 ENCOUNTER — Ambulatory Visit: Payer: 59

## 2023-07-22 DIAGNOSIS — R293 Abnormal posture: Secondary | ICD-10-CM

## 2023-07-22 DIAGNOSIS — C50412 Malignant neoplasm of upper-outer quadrant of left female breast: Secondary | ICD-10-CM

## 2023-07-22 DIAGNOSIS — I890 Lymphedema, not elsewhere classified: Secondary | ICD-10-CM

## 2023-07-22 DIAGNOSIS — M25611 Stiffness of right shoulder, not elsewhere classified: Secondary | ICD-10-CM | POA: Diagnosis not present

## 2023-07-22 DIAGNOSIS — Z170 Estrogen receptor positive status [ER+]: Secondary | ICD-10-CM

## 2023-07-22 DIAGNOSIS — Z483 Aftercare following surgery for neoplasm: Secondary | ICD-10-CM

## 2023-07-22 NOTE — Therapy (Signed)
OUTPATIENT PHYSICAL THERAPY  UPPER EXTREMITY ONCOLOGY TREATMENT  Patient Name: Alverda Pellissier MRN: [id number] DOB:1981-06-24, 42 y.o., female Today's Date: 07/22/2023  END OF SESSION:  PT End of Session - 07/22/23 1400     Visit Number 8    Number of Visits 12    Date for PT Re-Evaluation 08/03/23    PT Start Time 1400    PT Stop Time 1455    PT Time Calculation (min) 55 min    Activity Tolerance Patient tolerated treatment well    Behavior During Therapy [facility] for tasks assessed/performed             Past Medical History:  Diagnosis Date   Anemia    history   BV (bacterial vaginosis) 06/04/2014   Cancer (HCC) 2024   Right Breast Cancer   Chronic kidney disease 2013   Kidney Transplant   ESRD on peritoneal dialysis (HCC) 10/05/2006   pt had kidney transplant 06/2012 and is no longer on dialysis   GERD (gastroesophageal reflux disease) 09/04/2009   ulcerative esophagitis per EGD    H/O kidney transplant 06/19/2012   Hematuria 07/02/2014   History of radiation therapy    Right Breast, left Breast- 05/26/22-07/13/22-Dr. Antony Blackbird   Hypertension    Poor appetite    History   Secondary hyperparathyroidism (HCC)    s/p parathyroidectomy with autotransplantation to left arm per Dr. Fredric Dine on 08/04/11   Streptococcal peritonitis (HCC) 06/06/2011   SVD (spontaneous vaginal delivery)    x 1   Urinary frequency 07/02/2014   UTI (lower urinary tract infection) 07/02/2014   Vaginal discharge 06/04/2014   Past Surgical History:  Procedure Laterality Date   arm graphs  10/05/2006   three - done at [facility]; still in but never used because pt got a transplant.   AXILLARY SENTINEL NODE BIOPSY Left 03/04/2022   Procedure: LEFT AXILLARY SENTINEL NODE BIOPSY;  Surgeon: Emelia Loron, MD;  Location: [facility] OR;  Service: General;  Laterality: Left;   BREAST BIOPSY Right 02/05/2023   MM RT BREAST BX W LOC DEV 1ST LESION IMAGE BX SPEC STEREO GUIDE 02/05/2023  [facility]   BREAST BIOPSY Right 02/12/2023   MM RT BREAST BX W LOC DEV EA AD LESION IMG BX SPEC STEREO GUIDE 02/12/2023 [facility]   BREAST BIOPSY Right 02/12/2023   MM RT BREAST BX W LOC DEV 1ST LESION IMAGE BX SPEC STEREO GUIDE 02/12/2023 [facility]   BREAST LUMPECTOMY WITH RADIOACTIVE SEED LOCALIZATION Bilateral 03/04/2022   Procedure: BILATERAL BRACKETED BREAST LUMPECTOMY WITH RADIOACTIVE SEED LOCALIZATION;  Surgeon: Emelia Loron, MD;  Location: [facility] OR;  Service: General;  Laterality: Bilateral;   BREAST RECONSTRUCTION WITH PLACEMENT OF TISSUE EXPANDER AND FLEX HD (ACELLULAR HYDRATED DERMIS) Right 05/19/2023   Procedure: BREAST RECONSTRUCTION WITH PLACEMENT OF TISSUE EXPANDER AND ACELULAR DERMAL MATRIX;  Surgeon: Allena Napoleon, MD;  Location: [facility] OR;  Service: Plastics;  Laterality: Right;   DILATATION & CURETTAGE/HYSTEROSCOPY WITH TRUECLEAR N/A 04/16/2014   Procedure: DILATATION & CURETTAGE/HYSTEROSCOPY WITH TRUCLEAR AND RESECTION OF FIBROID/THERMACHOICE ABLATION;  Surgeon: Tilda Burrow, MD;  Location: [facility]S;  Service: Gynecology;  Laterality: N/A;   KIDNEY TRANSPLANT Right 10/06/2011   [facility]   PARATHYROIDECTOMY  08/05/2011   with graft of parathyroid into left upper arm.   RE-EXCISION OF BREAST LUMPECTOMY Left 04/14/2022   Procedure: LEFT BREAST RE-EXCISION LUMPECTOMY;  Surgeon: Emelia Loron, MD;  Location: [facility];  Service: General;  Laterality: Left;  SIMPLE MASTECTOMY WITH AXILLARY SENTINEL NODE BIOPSY Right 05/19/2023   Procedure: RIGHT MASTECTOMY;  Surgeon: Emelia Loron, MD;  Location: [facility] OR;  Service: General;  Laterality: Right;   UMBILICAL HERNIA REPAIR     WISDOM TOOTH EXTRACTION     Patient Active Problem List   Diagnosis Date Noted   S/P mastectomy, right 05/19/2023   Malignant neoplasm of upper-outer quadrant of left breast in female, estrogen receptor positive (HCC) 02/09/2022   Ductal carcinoma in  situ (DCIS) of right breast 02/09/2022   Fibroids 01/06/2018   Abnormal uterine bleeding (AUB) 08/15/2015   Menorrhagia with regular cycle 03/14/2015   Urinary frequency 07/02/2014   Hematuria 07/02/2014   Lower urinary tract infectious disease 07/02/2014   Vaginal discharge 06/04/2014   BV (bacterial vaginosis) 06/04/2014   Post-operative state 05/02/2014   Submucous uterine fibroid with menorrhagia 02/05/2014   Sepsis (HCC) 11/20/2013   UTI (urinary tract infection) 11/20/2013   Myalgia 11/20/2013   Acute on chronic renal failure (HCC) 11/20/2013   Leukocytosis 11/20/2013   Menorrhagia, premenopausal 11/15/2013   Fever 08/30/2013   Acute pyelonephritis 06/29/2013   History of renal transplant 06/29/2013   Other ureteric obstruction 08/11/2012   Lymphocele 07/15/2012   Edema of both legs 07/04/2012   Immunosuppression (HCC) 06/28/2012   Hypocalcemia syndrome 08/19/2011    Class: Acute   Renal failure (ARF), acute on chronic (HCC) 08/19/2011    Class: Acute   HTN (hypertension) 08/19/2011    Class: Acute   GERD (gastroesophageal reflux disease) 08/19/2011    Class: Chronic   Hyperparathyroidism, secondary (HCC) 05/19/2011    PCP: Benita Stabile, MD  REFERRING PROVIDER: Emelia Loron, MD  REFERRING DIAG: [id number] (ICD-10-CM) - Other specified postprocedural states D05.11 (ICD-10-CM) - Intraductal carcinoma in situ of right breast C50.411 (ICD-10-CM) - Malignant neoplasm of upper-outer quadrant of right female breast  THERAPY DIAG:  Stiffness of right shoulder, not elsewhere classified  Aftercare following surgery for neoplasm  Lymphedema, not elsewhere classified  Abnormal posture  Malignant neoplasm of upper-outer quadrant of left breast in female, estrogen receptor positive (HCC)  Lymphedema of breast  Malignant neoplasm of upper-outer quadrant of right breast in female, estrogen receptor positive (HCC)  ONSET DATE: 05/19/23  Rationale for Evaluation and  Treatment: Rehabilitation  SUBJECTIVE:                                                                                                                                                                                           SUBJECTIVE STATEMENT:  I called my insurance company and I can order the Flexi touch thru [employer] without any rental  process.. Breast swelling is still about the same. The foam pad you made seems to help.   PERTINENT HISTORY: Patient was diagnosed on 01/26/2022 with bilateral  breast Cancer with Right  gr. 2 DCIS ER, PR + and left Invasive ductal carcinoma. It measures .6 cm and is located in the upper outer quadrant. It is ER, PR +, Her 2 - with a Ki67 of 1%. She is s/p bilateral breast lumpectomies and left SLNB (0/3) on 03/04/2022.  Had left breast re-excision on 04/14/2022. Diagnosed with recurrence of R DCIS (ER/PR+, HER2-) and underwent a mastectomy and expander reconstruction on 05/19/23. She is also to have radiation and anti estrogen. She is s/p a kidney transplant in 2013   PAIN:  Are you having pain? No, just tightness across chest  PRECAUTIONS: Other: at risk for lymphedema bilaterally  RED FLAGS: None   WEIGHT BEARING RESTRICTIONS: No  FALLS:  Has patient fallen in last 6 months? 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,   LEISURE: pt does not exercise  HAND DOMINANCE: right   PRIOR LEVEL OF FUNCTION: Independent  PATIENT GOALS: to get back to normal   OBJECTIVE:  COGNITION: Overall cognitive status: Within functional limits for tasks assessed   PALPATION: Tightness palpable along R lateral trunk  OBSERVATIONS / OTHER ASSESSMENTS: healing mastectomy scar with expander in place 07/06/2023: Left breast observed with significant swelling, enlarged pores, fibrosis at medial  breast and slight fibrosis at upper breast, right breast with expander slight enlarged pores medially  POSTURE: forward head, rounded shoulders  UPPER EXTREMITY AROM/PROM:  A/PROM RIGHT   eval  RIGHT 06/23/2023 RIGHT 07/22/2023  Shoulder extension 66 70   Shoulder flexion 141 173   Shoulder abduction 138 180   Shoulder internal rotation 60 72   Shoulder external rotation 95 115     (Blank rows = not tested)  A/PROM LEFT   eval  Shoulder extension 88  Shoulder flexion 176  Shoulder abduction 180  Shoulder internal rotation 71  Shoulder external rotation 94    (Blank rows = not tested)  UPPER EXTREMITY STRENGTH: 5/5  LYMPHEDEMA ASSESSMENTS:   SURGERY TYPE/DATE: 03/04/22- bilateral lumpectomies and L SLNB, 04/14/22- L breast re excision, 05/19/23- R mastectomy due to recurrence   NUMBER OF LYMPH NODES REMOVED: 0/3  CHEMOTHERAPY: none  RADIATION:completed 07/13/22  HORMONE TREATMENT: currently on Tamoxifen  INFECTIONS: none   LYMPHEDEMA ASSESSMENTS:   LANDMARK RIGHT  eval  At axilla  40.5  15 cm proximal to olecranon process 39.5  10 cm proximal to olecranon process 37.6  Olecranon process 28  15 cm proximal to ulnar styloid process 28.5  10 cm proximal to ulnar styloid process 23.5  Just proximal to ulnar styloid process 17.2  Across hand at thumb web space 20.5  At base of 2nd digit 6.9  (Blank rows = not tested)  LANDMARK LEFT  eval  At axilla  41  15 cm proximal to olecranon process 40.6  10 cm proximal to olecranon process 38.6  Olecranon process 28.3  15 cm proximal to ulnar styloid process 28.1  10 cm proximal to ulnar styloid process 24  Just proximal to ulnar styloid process 18  Across hand at thumb web space 21.2  At base of 2nd digit 7.2  (Blank rows = not tested) 07/06/2023 CHEST MEASUREMENT UNDER ARMS; 99  cm  QUICK DASH SURVEY:       TODAY'S TREATMENT:                                                                                                                                            DATE:  07/22/2023  Standing postural exs; Bilateral Scapular retraction, shoulder extension, bilateral ER x 10 Standing jobes flexion, scaption, abduction x 10 In supine: Short neck, 5 diaphragmatic breaths,  Left  axillary and left inguinal nodes and establishment of axilloinguinal pathway, then L breast moving fluid towards pathway spending extra time in any areas of fibrosis then retracing all steps and ending with LN's.  Updated HEP 07/15/2023  Reviewed Supine scapular series yellow x 5 flexion and scaption, x 10 horizontal abd, bilateral ER x 10 and sword x 5 ea In supine: Short neck, 5 diaphragmatic breaths,  Left  axillary and left inguinal nodes and establishment of axilloinguinal pathway, then L breast moving fluid towards pathway spending extra time in any areas of fibrosis then retracing all steps and ending with LN's. Educated pt while performing and pt performed all steps with occasional VC's and TC's  initially, and then doing very well Chip pack placed in medial bra by pt.  07/13/2023 Discussed flexitouch and gave pt information so she can call insurance Company. Supine scapular series x 10 flexion, ER, horizontal abd and sword Bil In supine: Short neck, 5 diaphragmatic breaths,  Left  axillary and left inguinal nodes and establishment of axilloinguinal pathway, then L breast moving fluid towards pathway spending extra time in any areas of fibrosis then retracing all steps and ending with LN's. Educated pt while performing and pt performed all steps with multiple VC's and TC's required initially, and then doing very well Made small chip pack to place in bra for medial fibrosis  07/08/2023  Standing wall arc x 4, left SB stretch Supine scapular series yellow x 5 flexion and scaption, x 10 horizontal abd, bilateral ER x 10 and sword x 5 ea In supine: Short neck, 5 diaphragmatic breaths,  L inguinal nodes and establishment of  axilloinguinal pathway, then L breast moving fluid towards pathwaysspending extra time in any areas of fibrosis then retracing all steps and ending with LN's. Educated pt while performing Photo taken of left nipple and placed in chart; skin crack in middle with area of white inside. May be lotion but sent to [facility] NP Updated HEP with yellow band and pictures of all 07/06/2023 Examined bilateral breast; increased edema with enlarged pores left breast with medial fibrosis and peau d'orange and bra imprint on left, mild medial breast swelling noted right breast, still pending fills. Pt has bilateral UE grafts that were never used for dialysis Pulleys x 2 min flexion and abd Supine AROM bilater flexion, scaption , horizontal abd x 5-6 reps ea STM Right pectorals, UT, lateral trunk with  cocoa butter PROM Right shoulder flexion, scaption, abd with scapular depression prn In supine: Short neck, 5 diaphragmatic breaths,  L inguinal nodes and establishment of axilloinguinal pathway, then L breast moving fluid towards pathwaysspending extra time in any areas of fibrosis then retracing all steps. Pt awaiting custom sleeve and glove from [facility] in Home Pt gave permission to send demographics to [employer]; will make aware she has bilateral grafts in UE never used for dialysis 06/23/2023  Therapeutic Exercises Pulleys into flex and abd x 2 mins each Roll yellow ball up wall :Flex and Rt abd x 10  Supine AROM flexion, scaption, horizontal abduction x5 Manual Therapy P/ROM to Rt shoulders into flex, abd and D2 to pts tolerance and with scapular depression throughout STM to Rt pect tendon , Right UT and Rt lateral trunk along areas of tightness, started in supine then with pt in Lt S/L with cocoa butter, also to medial scapular border Measured Right shoulder AROM  06/21/23: Therapeutic Exercises Pulleys into flex and abd x 2 mins each Roll yellow ball up wall :Flex and Rt abd  x 10 Manual Therapy P/ROM to Rt shoulders into flex, abd and D2 to pts tolerance and with scapular depression throughout STM to Rt pect tendon and Rt lateral trunk along areas of tightness, started in supine then with pt in Lt S/L with cocoa butter, also to medial scapular border Scap Mobs to Rt scapula into protraction and retraction  06/16/23: PROM to R shoulder in to flexion and abduction with v/c to relax to allow PROM, pt reports increased comfort afterwards, Issued info to pt to follow up on compression sleeve with [employer] for LUE that she was measured for earlier this year.     PATIENT EDUCATION:  Education details: need for post op PT for improving ROM Person educated: Patient Education method: Explanation Education comprehension: verbalized understanding  HOME EXERCISE PROGRAM: Post op breast exercises  ASSESSMENT:  CLINICAL IMPRESSION: Pt was instructed in standing postural exercises with red theraband and demonstrated  excellent form. HEP was updated. She has been compliant with compression bra and foam pad . Breast swelling remains. She has been in touch With [employer] about possibly getting a Flexi touch from them. OBJECTIVE IMPAIRMENTS: decreased ROM, decreased strength, increased edema, increased fascial restrictions, impaired UE functional use, postural dysfunction, and pain.   ACTIVITY LIMITATIONS: carrying, lifting, and reach over head  PARTICIPATION LIMITATIONS: meal prep, cleaning, laundry, community activity, occupation, and yard work  PERSONAL FACTORS: 1 comorbidity: hx of lumpectomy and radiation  are also affecting patient's functional outcome.   REHAB POTENTIAL: Good  CLINICAL DECISION MAKING: Stable/uncomplicated  EVALUATION COMPLEXITY: Low  GOALS: Goals reviewed with patient? Yes  SHORT TERM GOALS=LONG TERM GOALS Target date: 07/14/23  Pt will obtain appropriate compression garments for long term management of LUE lymphedema. Baseline: Goal  status: In progress;not yet received  2.  Pt will be independent with self MLD and compression bandaging for LUE. Baseline:  Goal status: INITIAL  3.  Pt will demonstrate 165 degrees of R shoulder flexion to allow her to reach overhead. Baseline:  Goal status: MET 06/23/2023 4.  Pt will demonstrate 165 degrees of R shoulder abduction to allow her to reach out to the side. Baseline:  Goal status:MET, 06/23/2023  5.  Pt will be independent in a home exercise program for continued strengthening and stretching.  Baseline:  Goal status: In progress  6.  Pt will report a 50% improvement in pain and discomfort  in R upper quadrant to allow improved comfort.  Baseline:  Goal status:In Progress 7. Pt will be independent in self MLD to left breast to reduce swelling NEW  PLAN:  PT FREQUENCY: 2x/week  PT DURATION: 4 weeks  PLANNED INTERVENTIONS: Therapeutic exercises, Therapeutic activity, Patient/Family education, Self Care, Joint mobilization, Orthotic/Fit training, Manual lymph drainage, Compression bandaging, scar mobilization, Taping, Vasopneumatic device, Manual therapy, and Re-evaluation  PLAN FOR NEXT SESSION: continue 1x/week, pulleys, ball, PROM R shoulder, STM R lateral trunk (serratus/lats); initiate strength,assess fit of sleeves when pt brings them in, instruct MLD to left breast swelling/left UE Demographics sent to [employer] with pt approval  Waynette Buttery, PT 07/22/2023, 3:02 PM

## 2023-08-03 ENCOUNTER — Ambulatory Visit: Payer: 59

## 2023-08-12 ENCOUNTER — Ambulatory Visit: Payer: 59 | Attending: General Surgery

## 2023-08-12 DIAGNOSIS — I890 Lymphedema, not elsewhere classified: Secondary | ICD-10-CM | POA: Diagnosis present

## 2023-08-12 DIAGNOSIS — M25611 Stiffness of right shoulder, not elsewhere classified: Secondary | ICD-10-CM | POA: Diagnosis present

## 2023-08-12 DIAGNOSIS — Z483 Aftercare following surgery for neoplasm: Secondary | ICD-10-CM | POA: Insufficient documentation

## 2023-08-12 DIAGNOSIS — C50412 Malignant neoplasm of upper-outer quadrant of left female breast: Secondary | ICD-10-CM | POA: Insufficient documentation

## 2023-08-12 DIAGNOSIS — R293 Abnormal posture: Secondary | ICD-10-CM | POA: Diagnosis present

## 2023-08-12 DIAGNOSIS — Z170 Estrogen receptor positive status [ER+]: Secondary | ICD-10-CM | POA: Diagnosis present

## 2023-08-12 DIAGNOSIS — C50411 Malignant neoplasm of upper-outer quadrant of right female breast: Secondary | ICD-10-CM | POA: Insufficient documentation

## 2023-08-12 NOTE — Therapy (Signed)
OUTPATIENT PHYSICAL THERAPY  UPPER EXTREMITY ONCOLOGY TREATMENT  Patient Name: Raven Walker MRN: [id number] DOB:[birth year], 42 y.o., female Today's Date: 08/12/2023  END OF SESSION:  PT End of Session - 08/12/23 1358     Visit Number 9    Number of Visits 13    Date for PT Re-Evaluation 09/09/23    PT Start Time 1405    PT Stop Time 1450    PT Time Calculation (min) 45 min    Activity Tolerance Patient tolerated treatment well    Behavior During Therapy [facility] for tasks assessed/performed             Past Medical History:  Diagnosis Date   Anemia    history   BV (bacterial vaginosis) 06/04/2014   Cancer (HCC) 2024   Right Breast Cancer   Chronic kidney disease 2013   Kidney Transplant   ESRD on peritoneal dialysis (HCC) 10/05/2006   pt had kidney transplant 06/2012 and is no longer on dialysis   GERD (gastroesophageal reflux disease) 09/04/2009   ulcerative esophagitis per EGD    H/O kidney transplant 06/19/2012   Hematuria 07/02/2014   History of radiation therapy    Right Breast, left Breast- 05/26/22-07/13/22-Dr. Antony Blackbird   Hypertension    Poor appetite    History   Secondary hyperparathyroidism (HCC)    s/p parathyroidectomy with autotransplantation to left arm per Dr. Fredric Dine on 08/04/11   Streptococcal peritonitis (HCC) 06/06/2011   SVD (spontaneous vaginal delivery)    x 1   Urinary frequency 07/02/2014   UTI (lower urinary tract infection) 07/02/2014   Vaginal discharge 06/04/2014   Past Surgical History:  Procedure Laterality Date   arm graphs  10/05/2006   three - done at [facility]; still in but never used because pt got a transplant.   AXILLARY SENTINEL NODE BIOPSY Left 03/04/2022   Procedure: LEFT AXILLARY SENTINEL NODE BIOPSY;  Surgeon: Emelia Loron, MD;  Location: [facility] OR;  Service: General;  Laterality: Left;   BREAST BIOPSY Right 02/05/2023   MM RT BREAST BX W LOC DEV 1ST LESION IMAGE BX SPEC STEREO GUIDE 02/05/2023  [facility]   BREAST BIOPSY Right 02/12/2023   MM RT BREAST BX W LOC DEV EA AD LESION IMG BX SPEC STEREO GUIDE 02/12/2023 [facility]   BREAST BIOPSY Right 02/12/2023   MM RT BREAST BX W LOC DEV 1ST LESION IMAGE BX SPEC STEREO GUIDE 02/12/2023 [facility]   BREAST LUMPECTOMY WITH RADIOACTIVE SEED LOCALIZATION Bilateral 03/04/2022   Procedure: BILATERAL BRACKETED BREAST LUMPECTOMY WITH RADIOACTIVE SEED LOCALIZATION;  Surgeon: Emelia Loron, MD;  Location: [facility] OR;  Service: General;  Laterality: Bilateral;   BREAST RECONSTRUCTION WITH PLACEMENT OF TISSUE EXPANDER AND FLEX HD (ACELLULAR HYDRATED DERMIS) Right 05/19/2023   Procedure: BREAST RECONSTRUCTION WITH PLACEMENT OF TISSUE EXPANDER AND ACELULAR DERMAL MATRIX;  Surgeon: Allena Napoleon, MD;  Location: [facility] OR;  Service: Plastics;  Laterality: Right;   DILATATION & CURETTAGE/HYSTEROSCOPY WITH TRUECLEAR N/A 04/16/2014   Procedure: DILATATION & CURETTAGE/HYSTEROSCOPY WITH TRUCLEAR AND RESECTION OF FIBROID/THERMACHOICE ABLATION;  Surgeon: Tilda Burrow, MD;  Location: [facility];  Service: Gynecology;  Laterality: N/A;   KIDNEY TRANSPLANT Right 10/06/2011   [facility]   PARATHYROIDECTOMY  08/05/2011   with graft of parathyroid into left upper arm.   RE-EXCISION OF BREAST LUMPECTOMY Left 04/14/2022   Procedure: LEFT BREAST RE-EXCISION LUMPECTOMY;  Surgeon: Emelia Loron, MD;  Location: [facility];  Service: General;  Laterality: Left;  SIMPLE MASTECTOMY WITH AXILLARY SENTINEL NODE BIOPSY Right 05/19/2023   Procedure: RIGHT MASTECTOMY;  Surgeon: Emelia Loron, MD;  Location: [facility] OR;  Service: General;  Laterality: Right;   UMBILICAL HERNIA REPAIR     WISDOM TOOTH EXTRACTION     Patient Active Problem List   Diagnosis Date Noted   S/P mastectomy, right 05/19/2023   Malignant neoplasm of upper-outer quadrant of left breast in female, estrogen receptor positive (HCC) 02/09/2022   Ductal carcinoma in  situ (DCIS) of right breast 02/09/2022   Fibroids 01/06/2018   Abnormal uterine bleeding (AUB) 08/15/2015   Menorrhagia with regular cycle 03/14/2015   Urinary frequency 07/02/2014   Hematuria 07/02/2014   Lower urinary tract infectious disease 07/02/2014   Vaginal discharge 06/04/2014   BV (bacterial vaginosis) 06/04/2014   Post-operative state 05/02/2014   Submucous uterine fibroid with menorrhagia 02/05/2014   Sepsis (HCC) 11/20/2013   UTI (urinary tract infection) 11/20/2013   Myalgia 11/20/2013   Acute on chronic renal failure (HCC) 11/20/2013   Leukocytosis 11/20/2013   Menorrhagia, premenopausal 11/15/2013   Fever 08/30/2013   Acute pyelonephritis 06/29/2013   History of renal transplant 06/29/2013   Other ureteric obstruction 08/11/2012   Lymphocele 07/15/2012   Edema of both legs 07/04/2012   Immunosuppression (HCC) 06/28/2012   Hypocalcemia syndrome 08/19/2011    Class: Acute   Renal failure (ARF), acute on chronic (HCC) 08/19/2011    Class: Acute   HTN (hypertension) 08/19/2011    Class: Acute   GERD (gastroesophageal reflux disease) 08/19/2011    Class: Chronic   Hyperparathyroidism, secondary (HCC) 05/19/2011    PCP: Benita Stabile, MD  REFERRING PROVIDER: Emelia Loron, MD  REFERRING DIAG: [phone] (ICD-10-CM) - Other specified postprocedural states D05.11 (ICD-10-CM) - Intraductal carcinoma in situ of right breast C50.411 (ICD-10-CM) - Malignant neoplasm of upper-outer quadrant of right female breast  THERAPY DIAG:  Stiffness of right shoulder, not elsewhere classified  Aftercare following surgery for neoplasm  Lymphedema, not elsewhere classified  Abnormal posture  Malignant neoplasm of upper-outer quadrant of left breast in female, estrogen receptor positive (HCC)  Lymphedema of breast  Malignant neoplasm of upper-outer quadrant of right breast in female, estrogen receptor positive (HCC)  ONSET DATE: 05/19/23  Rationale for Evaluation and  Treatment: Rehabilitation  SUBJECTIVE:                                                                                                                                                                                           SUBJECTIVE STATEMENT:  My sleeve is on the way.  I am getting the Flexi touch from [employer] because they were out  of network with [employer] but they will not set it up for me. I have the expander exchange on December 11.   PERTINENT HISTORY: Patient was diagnosed on 01/26/2022 with bilateral  breast Cancer with Right  gr. 2 DCIS ER, PR + and left Invasive ductal carcinoma. It measures .6 cm and is located in the upper outer quadrant. It is ER, PR +, Her 2 - with a Ki67 of 1%. She is s/p bilateral breast lumpectomies and left SLNB (0/3) on 03/04/2022.  Had left breast re-excision on 04/14/2022. Diagnosed with recurrence of R DCIS (ER/PR+, HER2-) and underwent a mastectomy and expander reconstruction on 05/19/23. She is also to have radiation and anti estrogen. She is s/p a kidney transplant in 2013   PAIN:  Are you having pain? No, just tightness across chest  PRECAUTIONS: Other: at risk for lymphedema bilaterally  RED FLAGS: None   WEIGHT BEARING RESTRICTIONS: No  FALLS:  Has patient fallen in last 6 months? 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,   LEISURE: pt does not exercise  HAND DOMINANCE: right   PRIOR LEVEL OF FUNCTION: Independent  PATIENT GOALS: to get back to normal   OBJECTIVE:  COGNITION: Overall cognitive status: Within functional limits for tasks assessed   PALPATION: Tightness palpable along R lateral trunk  OBSERVATIONS / OTHER ASSESSMENTS: healing mastectomy scar with expander in place 07/06/2023: Left breast observed with significant swelling, enlarged pores, fibrosis at medial  breast and slight fibrosis at upper breast, right breast with expander slight enlarged pores medially  POSTURE: forward head, rounded shoulders  UPPER EXTREMITY AROM/PROM:  A/PROM RIGHT   eval  RIGHT 06/23/2023 RIGHT 08/12/2023  Shoulder extension 66 70   Shoulder flexion 141 173 170  Shoulder abduction 138 180 180  Shoulder internal rotation 60 72 80  Shoulder external rotation 95 115 114    (Blank rows = not tested)  A/PROM LEFT   eval  Shoulder extension 88  Shoulder flexion 176  Shoulder abduction 180  Shoulder internal rotation 71  Shoulder external rotation 94    (Blank rows = not tested)  UPPER EXTREMITY STRENGTH: 5/5  LYMPHEDEMA ASSESSMENTS:   SURGERY TYPE/DATE: 03/04/22- bilateral lumpectomies and L SLNB, 04/14/22- L breast re excision, 05/19/23- R mastectomy due to recurrence   NUMBER OF LYMPH NODES REMOVED: 0/3  CHEMOTHERAPY: none  RADIATION:completed 07/13/22  HORMONE TREATMENT: currently on Tamoxifen  INFECTIONS: none   LYMPHEDEMA ASSESSMENTS:   LANDMARK RIGHT  eval  At axilla  40.5  15 cm proximal to olecranon process 39.5  10 cm proximal to olecranon process 37.6  Olecranon process 28  15 cm proximal to ulnar styloid process 28.5  10 cm proximal to ulnar styloid process 23.5  Just proximal to ulnar styloid process 17.2  Across hand at thumb web space 20.5  At base of 2nd digit 6.9  (Blank rows = not tested)  LANDMARK LEFT  eval  At axilla  41  15 cm proximal to olecranon process 40.6  10 cm proximal to olecranon process 38.6  Olecranon process 28.3  15 cm proximal to ulnar styloid process 28.1  10 cm proximal to ulnar styloid process 24  Just proximal to ulnar styloid process 18  Across hand at thumb web space 21.2  At base of 2nd digit 7.2  (Blank rows = not tested) 07/06/2023  CHEST MEASUREMENT UNDER ARMS; 99 cm  QUICK DASH SURVEY:       TODAY'S TREATMENT:                                                                                                                                            DATE:    08/12/2023  Measured AROM Standing scapular retraction, shoulder extension and bilateral ER red x 10, standing horizontal abd red x 10 In supine: Short neck, 5 diaphragmatic breaths,  Left  axillary and left inguinal nodes and establishment of axilloinguinal pathway, then L breast moving fluid towards pathway spending extra time in any areas of fibrosis then retracing all steps and ending with LN's. Pt practiced all steps and required only occasional VC's and TC's  07/22/2023  Standing postural exs; Bilateral Scapular retraction, shoulder extension, bilateral ER x 10 Standing jobes flexion, scaption, abduction x 10 In supine: Short neck, 5 diaphragmatic breaths,  Left  axillary and left inguinal nodes and establishment of axilloinguinal pathway, then L breast moving fluid towards pathway spending extra time in any areas of fibrosis then retracing all steps and ending with LN's.  Updated HEP 07/15/2023  Reviewed Supine scapular series yellow x 5 flexion and scaption, x 10 horizontal abd, bilateral ER x 10 and sword x 5 ea In supine: Short neck, 5 diaphragmatic breaths,  Left  axillary and left inguinal nodes and establishment of axilloinguinal pathway, then L breast moving fluid towards pathway spending extra time in any areas of fibrosis then retracing all steps and ending with LN's. Educated pt while performing and pt performed all steps with occasional VC's and TC's  initially, and then doing very well Chip pack placed in medial bra by pt.  07/13/2023 Discussed flexitouch and gave pt information so she can call insurance Company. Supine scapular series x 10 flexion, ER, horizontal abd and sword Bil In supine: Short neck, 5 diaphragmatic breaths,  Left  axillary and left inguinal nodes and establishment of axilloinguinal pathway, then L breast moving fluid towards pathway spending extra time in any areas of fibrosis  then retracing all steps and ending with LN's. Educated pt while performing and pt performed all steps with multiple VC's and TC's required initially, and then doing very well Made small chip pack to place in bra for medial fibrosis  07/08/2023  Standing wall arc x 4, left SB stretch Supine scapular series yellow x 5 flexion and scaption, x 10 horizontal abd, bilateral ER x 10 and sword x 5 ea In supine: Short neck, 5 diaphragmatic breaths,  L inguinal nodes and establishment of axilloinguinal pathway, then L breast moving fluid towards pathwaysspending extra time in any areas of fibrosis then retracing all steps and ending with LN's. Educated pt while performing Photo taken of left nipple and placed in chart; skin crack in middle with area of white inside. May be lotion but  sent to [facility] NP Updated HEP with yellow band and pictures of all 07/06/2023 Examined bilateral breast; increased edema with enlarged pores left breast with medial fibrosis and peau d'orange and bra imprint on left, mild medial breast swelling noted right breast, still pending fills. Pt has bilateral UE grafts that were never used for dialysis Pulleys x 2 min flexion and abd Supine AROM bilater flexion, scaption , horizontal abd x 5-6 reps ea STM Right pectorals, UT, lateral trunk with cocoa butter PROM Right shoulder flexion, scaption, abd with scapular depression prn In supine: Short neck, 5 diaphragmatic breaths,  L inguinal nodes and establishment of axilloinguinal pathway, then L breast moving fluid towards pathwaysspending extra time in any areas of fibrosis then retracing all steps. Pt awaiting custom sleeve and glove from [facility] in [place] Pt gave permission to send demographics to [employer]; will make aware she has bilateral grafts in UE never used for dialysis 06/23/2023  Therapeutic Exercises Pulleys into flex and abd x 2 mins each Roll yellow ball up wall :Flex and Rt abd x 10   Supine AROM flexion, scaption, horizontal abduction x5 Manual Therapy P/ROM to Rt shoulders into flex, abd and D2 to pts tolerance and with scapular depression throughout STM to Rt pect tendon , Right UT and Rt lateral trunk along areas of tightness, started in supine then with pt in Lt S/L with cocoa butter, also to medial scapular border Measured Right shoulder AROM  06/21/23: Therapeutic Exercises Pulleys into flex and abd x 2 mins each Roll yellow ball up wall :Flex and Rt abd x 10 Manual Therapy P/ROM to Rt shoulders into flex, abd and D2 to pts tolerance and with scapular depression throughout STM to Rt pect tendon and Rt lateral trunk along areas of tightness, started in supine then with pt in Lt S/L with cocoa butter, also to medial scapular border Scap Mobs to Rt scapula into protraction and retraction  06/16/23: PROM to R shoulder in to flexion and abduction with v/c to relax to allow PROM, pt reports increased comfort afterwards, Issued info to pt to follow up on compression sleeve with [employer] for LUE that she was measured for earlier this year.     PATIENT EDUCATION:  Education details: need for post op PT for improving ROM Person educated: Patient Education method: Explanation Education comprehension: verbalized understanding  HOME EXERCISE PROGRAM: Post op breast exercises  ASSESSMENT:  CLINICAL IMPRESSION:  Pt is making good progress toward goals and her shoulder ROM is well within limits now. She has very little pain. She is still experiencing left breast swelling and is learning the proper way to perform MLD. She demonstrated improved technique today but still requires review. She is still awaiting her compression garments and they do need to be checked to be sure they are correct. We will continue skilled therapy 1x/week for up to 4 weeks prn to address remaining deficits.  OBJECTIVE IMPAIRMENTS: decreased ROM, decreased strength, increased edema, increased fascial  restrictions, impaired UE functional use, postural dysfunction, and pain.   ACTIVITY LIMITATIONS: carrying, lifting, and reach over head  PARTICIPATION LIMITATIONS: meal prep, cleaning, laundry, community activity, occupation, and yard work  PERSONAL FACTORS: 1 comorbidity: hx of lumpectomy and radiation  are also affecting patient's functional outcome.   REHAB POTENTIAL: Good  CLINICAL DECISION MAKING: Stable/uncomplicated  EVALUATION COMPLEXITY: Low  GOALS: Goals reviewed with patient? Yes  SHORT TERM GOALS=LONG TERM GOALS Target date: 07/14/23  Pt will obtain appropriate compression garments for  long term management of LUE lymphedema. Baseline: Goal status: In progress;not yet received  2.  Pt will be independent with self MLD and compression bandaging for LUE. Baseline:  Goal status: INITIAL  3.  Pt will demonstrate 165 degrees of R shoulder flexion to allow her to reach overhead. Baseline:  Goal status: MET 06/23/2023 4.  Pt will demonstrate 165 degrees of R shoulder abduction to allow her to reach out to the side. Baseline:  Goal status:MET, 06/23/2023  5.  Pt will be independent in a home exercise program for continued strengthening and stretching.  Baseline:  Goal status: MET 08/12/2023 6.  Pt will report a 50% improvement in pain and discomfort in R upper quadrant to allow improved comfort.  Baseline:  Goal status:In Progress 7. Pt will be independent in self MLD to left breast to reduce swelling In Progress PLAN:  PT FREQUENCY: 1x/week  PT DURATION: 4 weeks  PLANNED INTERVENTIONS: Therapeutic exercises, Therapeutic activity, Patient/Family education, Self Care, Joint mobilization, Orthotic/Fit training, Manual lymph drainage, Compression bandaging, scar mobilization, Taping, Vasopneumatic device, Manual therapy, and Re-evaluation  PLAN FOR NEXT SESSION: continue 1x/week, pulleys, ball, PROM R shoulder, STM R lateral trunk (serratus/lats); initiate  strength,assess fit of sleeves when pt brings them in, instruct MLD to left breast swelling/left UE Demographics sent to [employer] with pt approval, SOZO?  Waynette Buttery, PT 08/12/2023, 5:30 PM

## 2023-08-16 ENCOUNTER — Ambulatory Visit: Payer: 59

## 2023-08-16 DIAGNOSIS — C50412 Malignant neoplasm of upper-outer quadrant of left female breast: Secondary | ICD-10-CM

## 2023-08-16 DIAGNOSIS — M25611 Stiffness of right shoulder, not elsewhere classified: Secondary | ICD-10-CM

## 2023-08-16 DIAGNOSIS — R293 Abnormal posture: Secondary | ICD-10-CM

## 2023-08-16 DIAGNOSIS — Z483 Aftercare following surgery for neoplasm: Secondary | ICD-10-CM

## 2023-08-16 DIAGNOSIS — I890 Lymphedema, not elsewhere classified: Secondary | ICD-10-CM

## 2023-08-16 DIAGNOSIS — C50411 Malignant neoplasm of upper-outer quadrant of right female breast: Secondary | ICD-10-CM

## 2023-08-16 NOTE — Therapy (Signed)
OUTPATIENT PHYSICAL THERAPY  UPPER EXTREMITY ONCOLOGY TREATMENT  Patient Name: Raven Walker MRN: [id number] DOB:09-May-1981, 42 y.o., female Today's Date: 08/16/2023  END OF SESSION:  PT End of Session - 08/16/23 1406     Visit Number 10    Number of Visits 13    Date for PT Re-Evaluation 09/09/23    PT Start Time 1402    PT Stop Time 1505    PT Time Calculation (min) 63 min    Activity Tolerance Patient tolerated treatment well    Behavior During Therapy [facility] for tasks assessed/performed             Past Medical History:  Diagnosis Date   Anemia    history   BV (bacterial vaginosis) 06/04/2014   Cancer (HCC) 2024   Right Breast Cancer   Chronic kidney disease 2013   Kidney Transplant   ESRD on peritoneal dialysis (HCC) 10/05/2006   pt had kidney transplant 06/2012 and is no longer on dialysis   GERD (gastroesophageal reflux disease) 09/04/2009   ulcerative esophagitis per EGD    H/O kidney transplant 06/19/2012   Hematuria 07/02/2014   History of radiation therapy    Right Breast, left Breast- 05/26/22-07/13/22-Dr. Antony Blackbird   Hypertension    Poor appetite    History   Secondary hyperparathyroidism (HCC)    s/p parathyroidectomy with autotransplantation to left arm per Dr. Fredric Dine on 08/04/11   Streptococcal peritonitis (HCC) 06/06/2011   SVD (spontaneous vaginal delivery)    x 1   Urinary frequency 07/02/2014   UTI (lower urinary tract infection) 07/02/2014   Vaginal discharge 06/04/2014   Past Surgical History:  Procedure Laterality Date   arm graphs  10/05/2006   three - done at [facility]; still in but never used because pt got a transplant.   AXILLARY SENTINEL NODE BIOPSY Left 03/04/2022   Procedure: LEFT AXILLARY SENTINEL NODE BIOPSY;  Surgeon: Emelia Loron, MD;  Location: [facility] OR;  Service: General;  Laterality: Left;   BREAST BIOPSY Right 02/05/2023   MM RT BREAST BX W LOC DEV 1ST LESION IMAGE BX SPEC STEREO GUIDE 02/05/2023  [facility]   BREAST BIOPSY Right 02/12/2023   MM RT BREAST BX W LOC DEV EA AD LESION IMG BX SPEC STEREO GUIDE 02/12/2023 [facility]   BREAST BIOPSY Right 02/12/2023   MM RT BREAST BX W LOC DEV 1ST LESION IMAGE BX SPEC STEREO GUIDE 02/12/2023 [facility]   BREAST LUMPECTOMY WITH RADIOACTIVE SEED LOCALIZATION Bilateral 03/04/2022   Procedure: BILATERAL BRACKETED BREAST LUMPECTOMY WITH RADIOACTIVE SEED LOCALIZATION;  Surgeon: Emelia Loron, MD;  Location: [facility] OR;  Service: General;  Laterality: Bilateral;   BREAST RECONSTRUCTION WITH PLACEMENT OF TISSUE EXPANDER AND FLEX HD (ACELLULAR HYDRATED DERMIS) Right 05/19/2023   Procedure: BREAST RECONSTRUCTION WITH PLACEMENT OF TISSUE EXPANDER AND ACELULAR DERMAL MATRIX;  Surgeon: Allena Napoleon, MD;  Location: [facility] OR;  Service: Plastics;  Laterality: Right;   DILATATION & CURETTAGE/HYSTEROSCOPY WITH TRUECLEAR N/A 04/16/2014   Procedure: DILATATION & CURETTAGE/HYSTEROSCOPY WITH TRUCLEAR AND RESECTION OF FIBROID/THERMACHOICE ABLATION;  Surgeon: Tilda Burrow, MD;  Location: [facility];  Service: Gynecology;  Laterality: N/A;   KIDNEY TRANSPLANT Right 10/06/2011   [facility]   PARATHYROIDECTOMY  08/05/2011   with graft of parathyroid into left upper arm.   RE-EXCISION OF BREAST LUMPECTOMY Left 04/14/2022   Procedure: LEFT BREAST RE-EXCISION LUMPECTOMY;  Surgeon: Emelia Loron, MD;  Location: [facility];  Service: General;  Laterality: Left;  SIMPLE MASTECTOMY WITH AXILLARY SENTINEL NODE BIOPSY Right 05/19/2023   Procedure: RIGHT MASTECTOMY;  Surgeon: Emelia Loron, MD;  Location: [facility] OR;  Service: General;  Laterality: Right;   UMBILICAL HERNIA REPAIR     WISDOM TOOTH EXTRACTION     Patient Active Problem List   Diagnosis Date Noted   S/P mastectomy, right 05/19/2023   Malignant neoplasm of upper-outer quadrant of left breast in female, estrogen receptor positive (HCC) 02/09/2022   Ductal carcinoma in  situ (DCIS) of right breast 02/09/2022   Fibroids 01/06/2018   Abnormal uterine bleeding (AUB) 08/15/2015   Menorrhagia with regular cycle 03/14/2015   Urinary frequency 07/02/2014   Hematuria 07/02/2014   Lower urinary tract infectious disease 07/02/2014   Vaginal discharge 06/04/2014   BV (bacterial vaginosis) 06/04/2014   Post-operative state 05/02/2014   Submucous uterine fibroid with menorrhagia 02/05/2014   Sepsis (HCC) 11/20/2013   UTI (urinary tract infection) 11/20/2013   Myalgia 11/20/2013   Acute on chronic renal failure (HCC) 11/20/2013   Leukocytosis 11/20/2013   Menorrhagia, premenopausal 11/15/2013   Fever 08/30/2013   Acute pyelonephritis 06/29/2013   History of renal transplant 06/29/2013   Other ureteric obstruction 08/11/2012   Lymphocele 07/15/2012   Edema of both legs 07/04/2012   Immunosuppression (HCC) 06/28/2012   Hypocalcemia syndrome 08/19/2011    Class: Acute   Renal failure (ARF), acute on chronic (HCC) 08/19/2011    Class: Acute   HTN (hypertension) 08/19/2011    Class: Acute   GERD (gastroesophageal reflux disease) 08/19/2011    Class: Chronic   Hyperparathyroidism, secondary (HCC) 05/19/2011    PCP: Benita Stabile, MD  REFERRING PROVIDER: Emelia Loron, MD  REFERRING DIAG: [phone] (ICD-10-CM) - Other specified postprocedural states D05.11 (ICD-10-CM) - Intraductal carcinoma in situ of right breast C50.411 (ICD-10-CM) - Malignant neoplasm of upper-outer quadrant of right female breast  THERAPY DIAG:  Stiffness of right shoulder, not elsewhere classified  Aftercare following surgery for neoplasm  Lymphedema, not elsewhere classified  Abnormal posture  Malignant neoplasm of upper-outer quadrant of left breast in female, estrogen receptor positive (HCC)  Lymphedema of breast  Malignant neoplasm of upper-outer quadrant of right breast in female, estrogen receptor positive (HCC)  ONSET DATE: 05/19/23  Rationale for Evaluation and  Treatment: Rehabilitation  SUBJECTIVE:                                                                                                                                                                                           SUBJECTIVE STATEMENT:  My sleeves and gloves just arrived. I brought them with me today.    PERTINENT HISTORY: Patient was  diagnosed on 01/26/2022 with bilateral  breast Cancer with Right  gr. 2 DCIS ER, PR + and left Invasive ductal carcinoma. It measures .6 cm and is located in the upper outer quadrant. It is ER, PR +, Her 2 - with a Ki67 of 1%. She is s/p bilateral breast lumpectomies and left SLNB (0/3) on 03/04/2022.  Had left breast re-excision on 04/14/2022. Diagnosed with recurrence of R DCIS (ER/PR+, HER2-) and underwent a mastectomy and expander reconstruction on 05/19/23. She is also to have radiation and anti estrogen. She is s/p a kidney transplant in 2013   PAIN:  Are you having pain? No, just tightness across chest  PRECAUTIONS: Other: at risk for lymphedema bilaterally  RED FLAGS: None   WEIGHT BEARING RESTRICTIONS: No  FALLS:  Has patient fallen in last 6 months? 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,   LEISURE: pt does not exercise  HAND DOMINANCE: right   PRIOR LEVEL OF FUNCTION: Independent  PATIENT GOALS: to get back to normal   OBJECTIVE:  COGNITION: Overall cognitive status: Within functional limits for tasks assessed   PALPATION: Tightness palpable along R lateral trunk  OBSERVATIONS / OTHER ASSESSMENTS: healing mastectomy scar with expander in place 07/06/2023: Left breast observed with significant swelling, enlarged pores, fibrosis at medial breast and slight fibrosis at upper breast, right breast with expander slight enlarged pores medially  POSTURE: forward head,  rounded shoulders  UPPER EXTREMITY AROM/PROM:  A/PROM RIGHT   eval  RIGHT 06/23/2023 RIGHT 08/12/2023  Shoulder extension 66 70   Shoulder flexion 141 173 170  Shoulder abduction 138 180 180  Shoulder internal rotation 60 72 80  Shoulder external rotation 95 115 114    (Blank rows = not tested)  A/PROM LEFT   eval  Shoulder extension 88  Shoulder flexion 176  Shoulder abduction 180  Shoulder internal rotation 71  Shoulder external rotation 94    (Blank rows = not tested)  UPPER EXTREMITY STRENGTH: 5/5  LYMPHEDEMA ASSESSMENTS:   SURGERY TYPE/DATE: 03/04/22- bilateral lumpectomies and L SLNB, 04/14/22- L breast re excision, 05/19/23- R mastectomy due to recurrence   NUMBER OF LYMPH NODES REMOVED: 0/3  CHEMOTHERAPY: none  RADIATION:completed 07/13/22  HORMONE TREATMENT: currently on Tamoxifen  INFECTIONS: none   LYMPHEDEMA ASSESSMENTS:   LANDMARK RIGHT  eval  At axilla  40.5  15 cm proximal to olecranon process 39.5  10 cm proximal to olecranon process 37.6  Olecranon process 28  15 cm proximal to ulnar styloid process 28.5  10 cm proximal to ulnar styloid process 23.5  Just proximal to ulnar styloid process 17.2  Across hand at thumb web space 20.5  At base of 2nd digit 6.9  (Blank rows = not tested)  LANDMARK LEFT  eval  At axilla  41  15 cm proximal to olecranon process 40.6  10 cm proximal to olecranon process 38.6  Olecranon process 28.3  15 cm proximal to ulnar styloid process 28.1  10 cm proximal to ulnar styloid process 24  Just proximal to ulnar styloid process 18  Across hand at thumb web space 21.2  At base of 2nd digit 7.2  (Blank rows = not tested) 07/06/2023 CHEST MEASUREMENT UNDER ARMS; 99 cm  QUICK DASH SURVEY:       TODAY'S TREATMENT:  DATE:   08/16/23:  Manual Therapy Pt brought her new  compression sleeves and gloves. Her sleeve is slightly short but should be okay as it is near the axilla and doesn't cut into her upper arm.  In supine: Short neck, 5 diaphragmatic breaths,  Lt left inguinal nodes and establishment of  Lt axillo-inguinal pathway, then L breast moving fluid towards pathway spending extra time in any areas of fibrosis then retracing all steps and ending with LN's.  P/ROM to bil shoulders into flex, abd and D2 with bil scapular depression throughout. Therapeutic Exercises Pulleys into flex and abd x 2 mins each returning therapist demo and VC's during to decrease scapular compensation Roll yellow ball up wall into flexion x 10 and abd x 5 each side Forearms on wall with yellow theraband for following: Wall walking x 5 each, scap retraction x 5 each   08/12/2023  Measured AROM Standing scapular retraction, shoulder extension and bilateral ER red x 10, standing horizontal abd red x 10 In supine: Short neck, 5 diaphragmatic breaths,  Left  axillary and left inguinal nodes and establishment of axilloinguinal pathway, then L breast moving fluid towards pathway spending extra time in any areas of fibrosis then retracing all steps and ending with LN's. Pt practiced all steps and required only occasional VC's and TC's  07/22/2023  Standing postural exs; Bilateral Scapular retraction, shoulder extension, bilateral ER x 10 Standing jobes flexion, scaption, abduction x 10 In supine: Short neck, 5 diaphragmatic breaths,  Left  axillary and left inguinal nodes and establishment of axilloinguinal pathway, then L breast moving fluid towards pathway spending extra time in any areas of fibrosis then retracing all steps and ending with LN's.  Updated HEP 07/15/2023  Reviewed Supine scapular series yellow x 5 flexion and scaption, x 10 horizontal abd, bilateral ER x 10 and sword x 5 ea In supine: Short neck, 5 diaphragmatic breaths,  Left  axillary and left inguinal nodes and  establishment of axilloinguinal pathway, then L breast moving fluid towards pathway spending extra time in any areas of fibrosis then retracing all steps and ending with LN's. Educated pt while performing and pt performed all steps with occasional VC's and TC's  initially, and then doing very well Chip pack placed in medial bra by pt.  07/13/2023 Discussed flexitouch and gave pt information so she can call insurance Company. Supine scapular series x 10 flexion, ER, horizontal abd and sword Bil In supine: Short neck, 5 diaphragmatic breaths,  Left  axillary and left inguinal nodes and establishment of axilloinguinal pathway, then L breast moving fluid towards pathway spending extra time in any areas of fibrosis then retracing all steps and ending with LN's. Educated pt while performing and pt performed all steps with multiple VC's and TC's required initially, and then doing very well Made small chip pack to place in bra for medial fibrosis  07/08/2023  Standing wall arc x 4, left SB stretch Supine scapular series yellow x 5 flexion and scaption, x 10 horizontal abd, bilateral ER x 10 and sword x 5 ea In supine: Short neck, 5 diaphragmatic breaths,  L inguinal nodes and establishment of axilloinguinal pathway, then L breast moving fluid towards pathwaysspending extra time in any areas of fibrosis then retracing all steps and ending with LN's. Educated pt while performing Photo taken of left nipple and placed in chart; skin crack in middle with area of white inside. May be lotion but sent to [facility] NP Updated HEP with yellow  band and pictures of all 07/06/2023 Examined bilateral breast; increased edema with enlarged pores left breast with medial fibrosis and peau d'orange and bra imprint on left, mild medial breast swelling noted right breast, still pending fills. Pt has bilateral UE grafts that were never used for dialysis Pulleys x 2 min flexion and abd Supine AROM bilater flexion, scaption ,  horizontal abd x 5-6 reps ea STM Right pectorals, UT, lateral trunk with cocoa butter PROM Right shoulder flexion, scaption, abd with scapular depression prn In supine: Short neck, 5 diaphragmatic breaths,  L inguinal nodes and establishment of axilloinguinal pathway, then L breast moving fluid towards pathwaysspending extra time in any areas of fibrosis then retracing all steps. Pt awaiting custom sleeve and glove from [facility] in [place] Pt gave permission to send demographics to [facility] [employer]; will make aware she has bilateral grafts in UE never used for dialysis  06/23/2023  Therapeutic Exercises Pulleys into flex and abd x 2 mins each Roll yellow ball up wall :Flex and Rt abd x 10  Supine AROM flexion, scaption, horizontal abduction x5 Manual Therapy P/ROM to Rt shoulders into flex, abd and D2 to pts tolerance and with scapular depression throughout STM to Rt pect tendon , Right UT and Rt lateral trunk along areas of tightness, started in supine then with pt in Lt S/L with cocoa butter, also to medial scapular border Measured Right shoulder AROM  06/21/23: Therapeutic Exercises Pulleys into flex and abd x 2 mins each Roll yellow ball up wall :Flex and Rt abd x 10 Manual Therapy P/ROM to Rt shoulders into flex, abd and D2 to pts tolerance and with scapular depression throughout STM to Rt pect tendon and Rt lateral trunk along areas of tightness, started in supine then with pt in Lt S/L with cocoa butter, also to medial scapular border Scap Mobs to Rt scapula into protraction and retraction  06/16/23: PROM to R shoulder in to flexion and abduction with v/c to relax to allow PROM, pt reports increased comfort afterwards, Issued info to pt to follow up on compression sleeve with [employer] for LUE that she was measured for earlier this year.     PATIENT EDUCATION:  Education details: need for post op PT for improving ROM Person educated: Patient Education method:  Explanation Education comprehension: verbalized understanding  HOME EXERCISE PROGRAM: Post op breast exercises  ASSESSMENT:  CLINICAL IMPRESSION:  Pt brought her new compression sleeves and gloves and educated pt how to don and doff them while also assessing fit. Her glove was a good fit and overall so was the sleeve but it was a bit short however should be fine as it doesn't cut into her upper arm. Progressed pt to include scapular stability which she did well with reporting she could feel the scapular muscles engaged. Then continued with MLD of Lt breast. Scab came off new sore on Rt breast and small opening present with white tissue visible. Pt reports Dr. Arita Miss is aware of this and she sees Dr. Dwain Sarna Friday in regards to her compression pump.   OBJECTIVE IMPAIRMENTS: decreased ROM, decreased strength, increased edema, increased fascial restrictions, impaired UE functional use, postural dysfunction, and pain.   ACTIVITY LIMITATIONS: carrying, lifting, and reach over head  PARTICIPATION LIMITATIONS: meal prep, cleaning, laundry, community activity, occupation, and yard work  PERSONAL FACTORS: 1 comorbidity: hx of lumpectomy and radiation  are also affecting patient's functional outcome.   REHAB POTENTIAL: Good  CLINICAL DECISION MAKING: Stable/uncomplicated  EVALUATION COMPLEXITY: Low  GOALS: Goals reviewed with patient? Yes  SHORT TERM GOALS=LONG TERM GOALS Target date: 07/14/23  Pt will obtain appropriate compression garments for long term management of LUE lymphedema. Baseline: Goal status: In progress;not yet received  2.  Pt will be independent with self MLD and compression bandaging for LUE. Baseline:  Goal status: INITIAL  3.  Pt will demonstrate 165 degrees of R shoulder flexion to allow her to reach overhead. Baseline:  Goal status: MET 06/23/2023 4.  Pt will demonstrate 165 degrees of R shoulder abduction to allow her to reach out to the side. Baseline:  Goal  status:MET, 06/23/2023  5.  Pt will be independent in a home exercise program for continued strengthening and stretching.  Baseline:  Goal status: MET 08/12/2023 6.  Pt will report a 50% improvement in pain and discomfort in R upper quadrant to allow improved comfort.  Baseline:  Goal status:In Progress 7. Pt will be independent in self MLD to left breast to reduce swelling In Progress PLAN:  PT FREQUENCY: 1x/week  PT DURATION: 4 weeks  PLANNED INTERVENTIONS: Therapeutic exercises, Therapeutic activity, Patient/Family education, Self Care, Joint mobilization, Orthotic/Fit training, Manual lymph drainage, Compression bandaging, scar mobilization, Taping, Vasopneumatic device, Manual therapy, and Re-evaluation  PLAN FOR NEXT SESSION: continue 1x/week, pulleys, ball, PROM R shoulder, STM R lateral trunk (serratus/lats); initiate strength,assess fit of sleeves when pt brings them in, instruct MLD to left breast swelling/left UE Demographics sent to [facility] with pt approval, SOZO?  Hermenia Bers, PTA 08/16/2023, 3:39 PM

## 2023-08-24 ENCOUNTER — Ambulatory Visit: Payer: 59

## 2023-08-24 DIAGNOSIS — Z483 Aftercare following surgery for neoplasm: Secondary | ICD-10-CM

## 2023-08-24 DIAGNOSIS — I890 Lymphedema, not elsewhere classified: Secondary | ICD-10-CM

## 2023-08-24 DIAGNOSIS — C50412 Malignant neoplasm of upper-outer quadrant of left female breast: Secondary | ICD-10-CM

## 2023-08-24 DIAGNOSIS — Z170 Estrogen receptor positive status [ER+]: Secondary | ICD-10-CM

## 2023-08-24 DIAGNOSIS — M25611 Stiffness of right shoulder, not elsewhere classified: Secondary | ICD-10-CM

## 2023-08-24 DIAGNOSIS — R293 Abnormal posture: Secondary | ICD-10-CM

## 2023-08-24 NOTE — Therapy (Signed)
OUTPATIENT PHYSICAL THERAPY  UPPER EXTREMITY ONCOLOGY TREATMENT  Patient Name: Raven Walker MRN: [id number] DOB:1981-01-20, 42 y.o., female Today's Date: 08/24/2023  END OF SESSION:  PT End of Session - 08/24/23 1359     Visit Number 11    Number of Visits 13    Date for PT Re-Evaluation 09/09/23    PT Start Time 1400    PT Stop Time 1458    PT Time Calculation (min) 58 min    Activity Tolerance Patient tolerated treatment well    Behavior During Therapy [facility] for tasks assessed/performed             Past Medical History:  Diagnosis Date   Anemia    history   BV (bacterial vaginosis) 06/04/2014   Cancer (HCC) 2024   Right Breast Cancer   Chronic kidney disease 2013   Kidney Transplant   ESRD on peritoneal dialysis (HCC) 10/05/2006   pt had kidney transplant 06/2012 and is no longer on dialysis   GERD (gastroesophageal reflux disease) 09/04/2009   ulcerative esophagitis per EGD    H/O kidney transplant 06/19/2012   Hematuria 07/02/2014   History of radiation therapy    Right Breast, left Breast- 05/26/22-07/13/22-Dr. Antony Blackbird   Hypertension    Poor appetite    History   Secondary hyperparathyroidism (HCC)    s/p parathyroidectomy with autotransplantation to left arm per Dr. Fredric Dine on 08/04/11   Streptococcal peritonitis (HCC) 06/06/2011   SVD (spontaneous vaginal delivery)    x 1   Urinary frequency 07/02/2014   UTI (lower urinary tract infection) 07/02/2014   Vaginal discharge 06/04/2014   Past Surgical History:  Procedure Laterality Date   arm graphs  10/05/2006   three - done at [facility]; still in but never used because pt got a transplant.   AXILLARY SENTINEL NODE BIOPSY Left 03/04/2022   Procedure: LEFT AXILLARY SENTINEL NODE BIOPSY;  Surgeon: Emelia Loron, MD;  Location: [facility] OR;  Service: General;  Laterality: Left;   BREAST BIOPSY Right 02/05/2023   MM RT BREAST BX W LOC DEV 1ST LESION IMAGE BX SPEC STEREO GUIDE 02/05/2023  [facility]   BREAST BIOPSY Right 02/12/2023   MM RT BREAST BX W LOC DEV EA AD LESION IMG BX SPEC STEREO GUIDE 02/12/2023 [facility]   BREAST BIOPSY Right 02/12/2023   MM RT BREAST BX W LOC DEV 1ST LESION IMAGE BX SPEC STEREO GUIDE 02/12/2023 [facility]   BREAST LUMPECTOMY WITH RADIOACTIVE SEED LOCALIZATION Bilateral 03/04/2022   Procedure: BILATERAL BRACKETED BREAST LUMPECTOMY WITH RADIOACTIVE SEED LOCALIZATION;  Surgeon: Emelia Loron, MD;  Location: [facility] OR;  Service: General;  Laterality: Bilateral;   BREAST RECONSTRUCTION WITH PLACEMENT OF TISSUE EXPANDER AND FLEX HD (ACELLULAR HYDRATED DERMIS) Right 05/19/2023   Procedure: BREAST RECONSTRUCTION WITH PLACEMENT OF TISSUE EXPANDER AND ACELULAR DERMAL MATRIX;  Surgeon: Allena Napoleon, MD;  Location: [facility] OR;  Service: Plastics;  Laterality: Right;   DILATATION & CURETTAGE/HYSTEROSCOPY WITH TRUECLEAR N/A 04/16/2014   Procedure: DILATATION & CURETTAGE/HYSTEROSCOPY WITH TRUCLEAR AND RESECTION OF FIBROID/THERMACHOICE ABLATION;  Surgeon: Tilda Burrow, MD;  Location: [facility];  Service: Gynecology;  Laterality: N/A;   KIDNEY TRANSPLANT Right 10/06/2011   [facility]   PARATHYROIDECTOMY  08/05/2011   with graft of parathyroid into left upper arm.   RE-EXCISION OF BREAST LUMPECTOMY Left 04/14/2022   Procedure: LEFT BREAST RE-EXCISION LUMPECTOMY;  Surgeon: Emelia Loron, MD;  Location: [facility];  Service: General;  Laterality: Left;  SIMPLE MASTECTOMY WITH AXILLARY SENTINEL NODE BIOPSY Right 05/19/2023   Procedure: RIGHT MASTECTOMY;  Surgeon: Emelia Loron, MD;  Location: [facility] OR;  Service: General;  Laterality: Right;   UMBILICAL HERNIA REPAIR     WISDOM TOOTH EXTRACTION     Patient Active Problem List   Diagnosis Date Noted   S/P mastectomy, right 05/19/2023   Malignant neoplasm of upper-outer quadrant of left breast in female, estrogen receptor positive (HCC) 02/09/2022   Ductal carcinoma in  situ (DCIS) of right breast 02/09/2022   Fibroids 01/06/2018   Abnormal uterine bleeding (AUB) 08/15/2015   Menorrhagia with regular cycle 03/14/2015   Urinary frequency 07/02/2014   Hematuria 07/02/2014   Lower urinary tract infectious disease 07/02/2014   Vaginal discharge 06/04/2014   BV (bacterial vaginosis) 06/04/2014   Post-operative state 05/02/2014   Submucous uterine fibroid with menorrhagia 02/05/2014   Sepsis (HCC) 11/20/2013   UTI (urinary tract infection) 11/20/2013   Myalgia 11/20/2013   Acute on chronic renal failure (HCC) 11/20/2013   Leukocytosis 11/20/2013   Menorrhagia, premenopausal 11/15/2013   Fever 08/30/2013   Acute pyelonephritis 06/29/2013   History of renal transplant 06/29/2013   Other ureteric obstruction 08/11/2012   Lymphocele 07/15/2012   Edema of both legs 07/04/2012   Immunosuppression (HCC) 06/28/2012   Hypocalcemia syndrome 08/19/2011    Class: Acute   Renal failure (ARF), acute on chronic (HCC) 08/19/2011    Class: Acute   HTN (hypertension) 08/19/2011    Class: Acute   GERD (gastroesophageal reflux disease) 08/19/2011    Class: Chronic   Hyperparathyroidism, secondary (HCC) 05/19/2011    PCP: Benita Stabile, MD  REFERRING PROVIDER: Emelia Loron, MD  REFERRING DIAG: [id number] (ICD-10-CM) - Other specified postprocedural states D05.11 (ICD-10-CM) - Intraductal carcinoma in situ of right breast C50.411 (ICD-10-CM) - Malignant neoplasm of upper-outer quadrant of right female breast  THERAPY DIAG:  Stiffness of right shoulder, not elsewhere classified  Aftercare following surgery for neoplasm  Lymphedema, not elsewhere classified  Abnormal posture  Lymphedema of breast  Malignant neoplasm of upper-outer quadrant of left breast in female, estrogen receptor positive (HCC)  Malignant neoplasm of upper-outer quadrant of right breast in female, estrogen receptor positive (HCC)  ONSET DATE: 05/19/23  Rationale for Evaluation and  Treatment: Rehabilitation  SUBJECTIVE:                                                                                                                                                                                           SUBJECTIVE STATEMENT:  I saw Dr. Arita Miss on Friday because the scab fell off my right breast.  He took out some  CC's and it seems better.I want you to look at my sleeve and glove.  I'm not sure they are right. They are sending the Flexi touch next week. The foam pad you made nearly disintegrated. Could you make another one?  PERTINENT HISTORY: Patient was diagnosed on 01/26/2022 with bilateral  breast Cancer with Right  gr. 2 DCIS ER, PR + and left Invasive ductal carcinoma. It measures .6 cm and is located in the upper outer quadrant. It is ER, PR +, Her 2 - with a Ki67 of 1%. She is s/p bilateral breast lumpectomies and left SLNB (0/3) on 03/04/2022.  Had left breast re-excision on 04/14/2022. Diagnosed with recurrence of R DCIS (ER/PR+, HER2-) and underwent a mastectomy and expander reconstruction on 05/19/23. She is also to have radiation and anti estrogen. She is s/p a kidney transplant in 2013   PAIN:  Are you having pain? No, just tightness across chest  PRECAUTIONS: Other: at risk for lymphedema bilaterally  RED FLAGS: None   WEIGHT BEARING RESTRICTIONS: No  FALLS:  Has patient fallen in last 6 months? 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,   LEISURE: pt does not exercise  HAND DOMINANCE: right   PRIOR LEVEL OF FUNCTION: Independent  PATIENT GOALS: to get back to normal   OBJECTIVE:  COGNITION: Overall cognitive status: Within functional limits for tasks assessed   PALPATION: Tightness palpable along R lateral trunk  OBSERVATIONS / OTHER ASSESSMENTS: healing mastectomy scar with expander in  place 07/06/2023: Left breast observed with significant swelling, enlarged pores, fibrosis at medial breast and slight fibrosis at upper breast, right breast with expander slight enlarged pores medially  POSTURE: forward head, rounded shoulders  UPPER EXTREMITY AROM/PROM:  A/PROM RIGHT   eval  RIGHT 06/23/2023 RIGHT 08/12/2023  Shoulder extension 66 70   Shoulder flexion 141 173 170  Shoulder abduction 138 180 180  Shoulder internal rotation 60 72 80  Shoulder external rotation 95 115 114    (Blank rows = not tested)  A/PROM LEFT   eval  Shoulder extension 88  Shoulder flexion 176  Shoulder abduction 180  Shoulder internal rotation 71  Shoulder external rotation 94    (Blank rows = not tested)  UPPER EXTREMITY STRENGTH: 5/5  LYMPHEDEMA ASSESSMENTS:   SURGERY TYPE/DATE: 03/04/22- bilateral lumpectomies and L SLNB, 04/14/22- L breast re excision, 05/19/23- R mastectomy due to recurrence   NUMBER OF LYMPH NODES REMOVED: 0/3  CHEMOTHERAPY: none  RADIATION:completed 07/13/22  HORMONE TREATMENT: currently on Tamoxifen  INFECTIONS: none   LYMPHEDEMA ASSESSMENTS:   LANDMARK RIGHT  eval  At axilla  40.5  15 cm proximal to olecranon process 39.5  10 cm proximal to olecranon process 37.6  Olecranon process 28  15 cm proximal to ulnar styloid process 28.5  10 cm proximal to ulnar styloid process 23.5  Just proximal to ulnar styloid process 17.2  Across hand at thumb web space 20.5  At base of 2nd digit 6.9  (Blank rows = not tested)  LANDMARK LEFT  eval  At axilla  41  15 cm proximal to olecranon process 40.6  10 cm proximal to olecranon process 38.6  Olecranon process 28.3  15 cm proximal to ulnar styloid process 28.1  10 cm proximal to ulnar styloid process 24  Just proximal to ulnar styloid process 18  Across hand  at thumb web space 21.2  At base of 2nd digit 7.2  (Blank rows = not tested) 07/06/2023 CHEST MEASUREMENT UNDER ARMS; 99 cm  QUICK DASH SURVEY:        TODAY'S TREATMENT:                                                                                                                                           DATE:   08/24/2023 Balls rolls forward x 10, abd x 5 Wall push up x 10 Significant medial breast fibrosis with enlarged pores. New foam pad in TG soft made for pt. Checked pts sleeve and glove ; Custom Sleeve is really short at wrist and not high enough in the back. Glove fits well. Pt will speak with [facility] and see if it can be remade In supine: Short neck, 5 diaphragmatic breaths,  Lt left inguinal nodes and establishment of  Lt axillo-inguinal pathway, then L breast moving fluid towards pathway spending extra time in any areas of fibrosis then retracing all steps and ending with LN's. Discussed that pump [employer] has may not be Flexi touch; She will double check again with [employer] to be sure  08/16/23: Manual Therapy Pt brought her new compression sleeves and gloves. Her sleeve is slightly short but should be okay as it is near the axilla and doesn't cut into her upper arm.  In supine: Short neck, 5 diaphragmatic breaths,  Lt left inguinal nodes and establishment of  Lt axillo-inguinal pathway, then L breast moving fluid towards pathway spending extra time in any areas of fibrosis then retracing all steps and ending with LN's.  P/ROM to bil shoulders into flex, abd and D2 with bil scapular depression throughout. Therapeutic Exercises Pulleys into flex and abd x 2 mins each returning therapist demo and VC's during to decrease scapular compensation Roll yellow ball up wall into flexion x 10 and abd x 5 each side Forearms on wall with yellow theraband for following: Wall walking x 5 each, scap retraction x 5 each   08/12/2023  Measured AROM Standing scapular retraction, shoulder extension and bilateral ER red x 10, standing horizontal abd red x 10 In supine: Short neck, 5 diaphragmatic breaths,  Left  axillary and left inguinal  nodes and establishment of axilloinguinal pathway, then L breast moving fluid towards pathway spending extra time in any areas of fibrosis then retracing all steps and ending with LN's. Pt practiced all steps and required only occasional VC's and TC's  07/22/2023  Standing postural exs; Bilateral Scapular retraction, shoulder extension, bilateral ER x 10 Standing jobes flexion, scaption, abduction x 10 In supine: Short neck, 5 diaphragmatic breaths,  Left  axillary and left inguinal nodes and establishment of axilloinguinal pathway, then L breast moving fluid towards pathway spending extra time in any areas of fibrosis then retracing all steps and ending with LN's.  Updated HEP 07/15/2023  Reviewed Supine scapular series yellow x 5 flexion and scaption, x 10 horizontal abd, bilateral ER x 10 and sword x 5 ea In supine: Short neck, 5 diaphragmatic breaths,  Left  axillary and left inguinal nodes and establishment of axilloinguinal pathway, then L breast moving fluid towards pathway spending extra time in any areas of fibrosis then retracing all steps and ending with LN's. Educated pt while performing and pt performed all steps with occasional VC's and TC's  initially, and then doing very well Chip pack placed in medial bra by pt.  07/13/2023 Discussed flexitouch and gave pt information so she can call insurance Company. Supine scapular series x 10 flexion, ER, horizontal abd and sword Bil In supine: Short neck, 5 diaphragmatic breaths,  Left  axillary and left inguinal nodes and establishment of axilloinguinal pathway, then L breast moving fluid towards pathway spending extra time in any areas of fibrosis then retracing all steps and ending with LN's. Educated pt while performing and pt performed all steps with multiple VC's and TC's required initially, and then doing very well Made small chip pack to place in bra for medial fibrosis  07/08/2023  Standing wall arc x 4, left SB stretch Supine  scapular series yellow x 5 flexion and scaption, x 10 horizontal abd, bilateral ER x 10 and sword x 5 ea In supine: Short neck, 5 diaphragmatic breaths,  L inguinal nodes and establishment of axilloinguinal pathway, then L breast moving fluid towards pathwaysspending extra time in any areas of fibrosis then retracing all steps and ending with LN's. Educated pt while performing Photo taken of left nipple and placed in chart; skin crack in middle with area of white inside. May be lotion but sent to [facility] An[facility] NP Updated HEP with yellow band and pictures of all 07/06/2023 Examined bilateral breast; increased edema with enlarged pores left breast with medial fibrosis and peau d'orange and bra imprint on left, mild medial breast swelling noted right breast, still pending fills. Pt has bilateral UE grafts that were never used for dialysis Pulleys x 2 min flexion and abd Supine AROM bilater flexion, scaption , horizontal abd x 5-6 reps ea STM Right pectorals, UT, lateral trunk with cocoa butter PROM Right shoulder flexion, scaption, abd with scapular depression prn In supine: Short neck, 5 diaphragmatic breaths,  L inguinal nodes and establishment of axilloinguinal pathway, then L breast moving fluid towards pathwaysspending extra time in any areas of fibrosis then retracing all steps. Pt awaiting custom sleeve and glove from [facility] in [place] Pt gave permission to send demographics to [employer]; will make aware she has bilateral grafts in UE never used for dialysis  06/23/2023  Therapeutic Exercises Pulleys into flex and abd x 2 mins each Roll yellow ball up wall :Flex and Rt abd x 10  Supine AROM flexion, scaption, horizontal abduction x5 Manual Therapy P/ROM to Rt shoulders into flex, abd and D2 to pts tolerance and with scapular depression throughout STM to Rt pect tendon , Right UT and Rt lateral trunk along areas of tightness, started in supine then with pt in Lt S/L  with cocoa butter, also to medial scapular border Measured Right shoulder AROM  06/21/23: Therapeutic Exercises Pulleys into flex and abd x 2 mins each Roll yellow ball up wall :Flex and Rt abd x 10 Manual Therapy P/ROM to Rt shoulders into flex, abd and D2 to pts tolerance and with scapular depression throughout STM to Rt pect tendon and Rt lateral trunk along areas  of tightness, started in supine then with pt in Lt S/L with cocoa butter, also to medial scapular border Scap Mobs to Rt scapula into protraction and retraction  06/16/23: PROM to R shoulder in to flexion and abduction with v/c to relax to allow PROM, pt reports increased comfort afterwards, Issued info to pt to follow up on compression sleeve with [facility] for LUE that she was measured for earlier this year.     PATIENT EDUCATION:  Education details: need for post op PT for improving ROM Person educated: Patient Education method: Explanation Education comprehension: verbalized understanding  HOME EXERCISE PROGRAM: Post op breast exercises  ASSESSMENT:  CLINICAL IMPRESSION: Check pts sleeve at her request; Sleeve is a bit short, but worse in the back and slides down with use of her arm. Pt will contact [employer] to see if it can be remade, and if so may see if [place] or Rodman Pickle can remeasure her. Increased fibrosis noted at left medial breast today with large impression from her compression bra. New foam pad made to place in bra.   OBJECTIVE IMPAIRMENTS: decreased ROM, decreased strength, increased edema, increased fascial restrictions, impaired UE functional use, postural dysfunction, and pain.   ACTIVITY LIMITATIONS: carrying, lifting, and reach over head  PARTICIPATION LIMITATIONS: meal prep, cleaning, laundry, community activity, occupation, and yard work  PERSONAL FACTORS: 1 comorbidity: hx of lumpectomy and radiation  are also affecting patient's functional outcome.   REHAB POTENTIAL: Good  CLINICAL DECISION MAKING:  Stable/uncomplicated  EVALUATION COMPLEXITY: Low  GOALS: Goals reviewed with patient? Yes  SHORT TERM GOALS=LONG TERM GOALS Target date: 07/14/23  Pt will obtain appropriate compression garments for long term management of LUE lymphedema. Baseline: Goal status: In progress;not yet received  2.  Pt will be independent with self MLD and compression bandaging for LUE. Baseline:  Goal status: INITIAL  3.  Pt will demonstrate 165 degrees of R shoulder flexion to allow her to reach overhead. Baseline:  Goal status: MET 06/23/2023 4.  Pt will demonstrate 165 degrees of R shoulder abduction to allow her to reach out to the side. Baseline:  Goal status:MET, 06/23/2023  5.  Pt will be independent in a home exercise program for continued strengthening and stretching.  Baseline:  Goal status: MET 08/12/2023 6.  Pt will report a 50% improvement in pain and discomfort in R upper quadrant to allow improved comfort.  Baseline:  Goal status:In Progress 7. Pt will be independent in self MLD to left breast to reduce swelling In Progress PLAN:  PT FREQUENCY: 1x/week  PT DURATION: 4 weeks  PLANNED INTERVENTIONS: Therapeutic exercises, Therapeutic activity, Patient/Family education, Self Care, Joint mobilization, Orthotic/Fit training, Manual lymph drainage, Compression bandaging, scar mobilization, Taping, Vasopneumatic device, Manual therapy, and Re-evaluation  PLAN FOR NEXT SESSION: continue 1x/week, Did she contact [facility] about sleeve being short and will they remake? measure circumference of arms,  PROM R shoulder, STM R lateral trunk (serratus/lats); initiate strength,instruct MLD to left breast and left UE ,Demographics sent to [facility] with pt approval, (pt getting pump from [employer])  Waynette Buttery, PT 08/24/2023, 4:00 PM

## 2023-08-31 ENCOUNTER — Ambulatory Visit: Payer: 59

## 2023-08-31 DIAGNOSIS — R293 Abnormal posture: Secondary | ICD-10-CM

## 2023-08-31 DIAGNOSIS — Z170 Estrogen receptor positive status [ER+]: Secondary | ICD-10-CM

## 2023-08-31 DIAGNOSIS — C50412 Malignant neoplasm of upper-outer quadrant of left female breast: Secondary | ICD-10-CM

## 2023-08-31 DIAGNOSIS — M25611 Stiffness of right shoulder, not elsewhere classified: Secondary | ICD-10-CM

## 2023-08-31 DIAGNOSIS — I890 Lymphedema, not elsewhere classified: Secondary | ICD-10-CM

## 2023-08-31 DIAGNOSIS — Z483 Aftercare following surgery for neoplasm: Secondary | ICD-10-CM

## 2023-08-31 NOTE — Therapy (Signed)
OUTPATIENT PHYSICAL THERAPY  UPPER EXTREMITY ONCOLOGY TREATMENT  Patient Name: Byanca Youngblood MRN: [id number] DOB:[birth year], 42 y.o., female Today's Date: 08/31/2023  END OF SESSION:  PT End of Session - 08/31/23 1459     Visit Number 12    Number of Visits 13    Date for PT Re-Evaluation 09/09/23    PT Start Time 1500    PT Stop Time 1556    PT Time Calculation (min) 56 min    Activity Tolerance Patient tolerated treatment well    Behavior During Therapy [facility] for tasks assessed/performed             Past Medical History:  Diagnosis Date   Anemia    history   BV (bacterial vaginosis) 06/04/2014   Cancer (HCC) 2024   Right Breast Cancer   Chronic kidney disease 2013   Kidney Transplant   ESRD on peritoneal dialysis (HCC) 10/05/2006   pt had kidney transplant 06/2012 and is no longer on dialysis   GERD (gastroesophageal reflux disease) 09/04/2009   ulcerative esophagitis per EGD    H/O kidney transplant 06/19/2012   Hematuria 07/02/2014   History of radiation therapy    Right Breast, left Breast- 05/26/22-07/13/22-Dr. Antony Blackbird   Hypertension    Poor appetite    History   Secondary hyperparathyroidism (HCC)    s/p parathyroidectomy with autotransplantation to left arm per Dr. Fredric Dine on 08/04/11   Streptococcal peritonitis (HCC) 06/06/2011   SVD (spontaneous vaginal delivery)    x 1   Urinary frequency 07/02/2014   UTI (lower urinary tract infection) 07/02/2014   Vaginal discharge 06/04/2014   Past Surgical History:  Procedure Laterality Date   arm graphs  10/05/2006   three - done at [facility]; still in but never used because pt got a transplant.   AXILLARY SENTINEL NODE BIOPSY Left 03/04/2022   Procedure: LEFT AXILLARY SENTINEL NODE BIOPSY;  Surgeon: Emelia Loron, MD;  Location: [facility] OR;  Service: General;  Laterality: Left;   BREAST BIOPSY Right 02/05/2023   MM RT BREAST BX W LOC DEV 1ST LESION IMAGE BX SPEC STEREO GUIDE 02/05/2023  [facility]   BREAST BIOPSY Right 02/12/2023   MM RT BREAST BX W LOC DEV EA AD LESION IMG BX SPEC STEREO GUIDE 02/12/2023 [facility]   BREAST BIOPSY Right 02/12/2023   MM RT BREAST BX W LOC DEV 1ST LESION IMAGE BX SPEC STEREO GUIDE 02/12/2023 [facility]AMMOGR[facility]   BREAST LUMPECTOMY WITH RADIOACTIVE SEED LOCALIZATION Bilateral 03/04/2022   Procedure: BILATERAL BRACKETED BREAST LUMPECTOMY WITH RADIOACTIVE SEED LOCALIZATION;  Surgeon: Emelia Loron, MD;  Location: [facility] OR;  Service: General;  Laterality: Bilateral;   BREAST RECONSTRUCTION WITH PLACEMENT OF TISSUE EXPANDER AND FLEX HD (ACELLULAR HYDRATED DERMIS) Right 05/19/2023   Procedure: BREAST RECONSTRUCTION WITH PLACEMENT OF TISSUE EXPANDER AND ACELULAR DERMAL MATRIX;  Surgeon: Allena Napoleon, MD;  Location: [facility] OR;  Service: Plastics;  Laterality: Right;   DILATATION & CURETTAGE/HYSTEROSCOPY WITH TRUECLEAR N/A 04/16/2014   Procedure: DILATATION & CURETTAGE/HYSTEROSCOPY WITH TRUCLEAR AND RESECTION OF FIBROID/THERMACHOICE ABLATION;  Surgeon: Tilda Burrow, MD;  Location: [facility] OR[facility];  Service: Gynecology;  Laterality: N/A;   KIDNEY TRANSPLANT Right 10/06/2011   [facility]   PARATHYROIDECTOMY  08/05/2011   with graft of parathyroid into left upper arm.   RE-EXCISION OF BREAST LUMPECTOMY Left 04/14/2022   Procedure: LEFT BREAST RE-EXCISION LUMPECTOMY;  Surgeon: Emelia Loron, MD;  Location: [facility];  Service: General;  Laterality: Left;  SIMPLE MASTECTOMY WITH AXILLARY SENTINEL NODE BIOPSY Right 05/19/2023   Procedure: RIGHT MASTECTOMY;  Surgeon: Emelia Loron, MD;  Location: [facility] OR;  Service: General;  Laterality: Right;   UMBILICAL HERNIA REPAIR     WISDOM TOOTH EXTRACTION     Patient Active Problem List   Diagnosis Date Noted   S/P mastectomy, right 05/19/2023   Malignant neoplasm of upper-outer quadrant of left breast in female, estrogen receptor positive (HCC) 02/09/2022   Ductal carcinoma in  situ (DCIS) of right breast 02/09/2022   Fibroids 01/06/2018   Abnormal uterine bleeding (AUB) 08/15/2015   Menorrhagia with regular cycle 03/14/2015   Urinary frequency 07/02/2014   Hematuria 07/02/2014   Lower urinary tract infectious disease 07/02/2014   Vaginal discharge 06/04/2014   BV (bacterial vaginosis) 06/04/2014   Post-operative state 05/02/2014   Submucous uterine fibroid with menorrhagia 02/05/2014   Sepsis (HCC) 11/20/2013   UTI (urinary tract infection) 11/20/2013   Myalgia 11/20/2013   Acute on chronic renal failure (HCC) 11/20/2013   Leukocytosis 11/20/2013   Menorrhagia, premenopausal 11/15/2013   Fever 08/30/2013   Acute pyelonephritis 06/29/2013   History of renal transplant 06/29/2013   Other ureteric obstruction 08/11/2012   Lymphocele 07/15/2012   Edema of both legs 07/04/2012   Immunosuppression (HCC) 06/28/2012   Hypocalcemia syndrome 08/19/2011    Class: Acute   Renal failure (ARF), acute on chronic (HCC) 08/19/2011    Class: Acute   HTN (hypertension) 08/19/2011    Class: Acute   GERD (gastroesophageal reflux disease) 08/19/2011    Class: Chronic   Hyperparathyroidism, secondary (HCC) 05/19/2011    PCP: Benita Stabile, MD  REFERRING PROVIDER: Emelia Loron, MD  REFERRING DIAG: [id number] (ICD-10-CM) - Other specified postprocedural states D05.11 (ICD-10-CM) - Intraductal carcinoma in situ of right breast C50.411 (ICD-10-CM) - Malignant neoplasm of upper-outer quadrant of right female breast  THERAPY DIAG:  Stiffness of right shoulder, not elsewhere classified  Aftercare following surgery for neoplasm  Lymphedema, not elsewhere classified  Abnormal posture  Lymphedema of breast  Malignant neoplasm of upper-outer quadrant of left breast in female, estrogen receptor positive (HCC)  Malignant neoplasm of upper-outer quadrant of right breast in female, estrogen receptor positive (HCC)  ONSET DATE: 05/19/23  Rationale for Evaluation and  Treatment: Rehabilitation  SUBJECTIVE:                                                                                                                                                                                           SUBJECTIVE STATEMENT:  I have not heard a thing from [facility]. I have my surgery for Expander exchange on Dec. 11,2024. I  don't think my left arm has been swollen. I am supposed to get my pump on Friday, but I know I can't use it yet.  PERTINENT HISTORY: Patient was diagnosed on 01/26/2022 with bilateral  breast Cancer with Right  gr. 2 DCIS ER, PR + and left Invasive ductal carcinoma. It measures .6 cm and is located in the upper outer quadrant. It is ER, PR +, Her 2 - with a Ki67 of 1%. She is s/p bilateral breast lumpectomies and left SLNB (0/3) on 03/04/2022.  Had left breast re-excision on 04/14/2022. Diagnosed with recurrence of R DCIS (ER/PR+, HER2-) and underwent a mastectomy and expander reconstruction on 05/19/23. She is also to have radiation and anti estrogen. She is s/p a kidney transplant in 2013   PAIN:  Are you having pain? No, just tightness across chest  PRECAUTIONS: Other: at risk for lymphedema bilaterally  RED FLAGS: None   WEIGHT BEARING RESTRICTIONS: No  FALLS:  Has patient fallen in last 6 months? 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,   LEISURE: pt does not exercise  HAND DOMINANCE: right   PRIOR LEVEL OF FUNCTION: Independent  PATIENT GOALS: to get back to normal   OBJECTIVE:  COGNITION: Overall cognitive status: Within functional limits for tasks assessed   PALPATION: Tightness palpable along R lateral trunk  OBSERVATIONS / OTHER ASSESSMENTS: healing mastectomy scar with expander in place 07/06/2023: Left breast observed with significant swelling, enlarged pores, fibrosis  at medial breast and slight fibrosis at upper breast, right breast with expander slight enlarged pores medially  POSTURE: forward head, rounded shoulders  UPPER EXTREMITY AROM/PROM:  A/PROM RIGHT   eval  RIGHT 06/23/2023 RIGHT 08/12/2023  Shoulder extension 66 70   Shoulder flexion 141 173 170  Shoulder abduction 138 180 180  Shoulder internal rotation 60 72 80  Shoulder external rotation 95 115 114    (Blank rows = not tested)  A/PROM LEFT   eval  Shoulder extension 88  Shoulder flexion 176  Shoulder abduction 180  Shoulder internal rotation 71  Shoulder external rotation 94    (Blank rows = not tested)  UPPER EXTREMITY STRENGTH: 5/5  LYMPHEDEMA ASSESSMENTS:   SURGERY TYPE/DATE: 03/04/22- bilateral lumpectomies and L SLNB, 04/14/22- L breast re excision, 05/19/23- R mastectomy due to recurrence   NUMBER OF LYMPH NODES REMOVED: 0/3  CHEMOTHERAPY: none  RADIATION:completed 07/13/22  HORMONE TREATMENT: currently on Tamoxifen  INFECTIONS: none   LYMPHEDEMA ASSESSMENTS:   LANDMARK RIGHT  eval  At axilla  40.5  15 cm proximal to olecranon process 39.5  10 cm proximal to olecranon process 37.6  Olecranon process 28  15 cm proximal to ulnar styloid process 28.5  10 cm proximal to ulnar styloid process 23.5  Just proximal to ulnar styloid process 17.2  Across hand at thumb web space 20.5  At base of 2nd digit 6.9  (Blank rows = not tested)  LANDMARK LEFT  eval  At axilla  41  15 cm proximal to olecranon process 40.6  10 cm proximal to olecranon process 38.6  Olecranon process 28.3  15 cm proximal to ulnar styloid process 28.1  10 cm proximal to ulnar styloid process 24  Just proximal to ulnar styloid process 18  Across hand at thumb web space 21.2  At base of 2nd digit 7.2  (Blank rows =  not tested) 07/06/2023 CHEST MEASUREMENT UNDER ARMS; 99 cm  QUICK DASH SURVEY:       TODAY'S TREATMENT:                                                                                                                                            DATE:   08/31/2023 Chip pack made for left breast medial area of fibrosis In sitting; Pt instructed in Left UE MLD with demonstration by therapist,  Short neck, 5 diaphragmatic breaths, Left axillary and  L inguinal nodes and establishment of axilloinguinal pathway, then L UE working proximal to distal, moving fluid from upper outer arm, inner arm outwards,outer arm to shoulder  and repeating pathway and doing both sides of forearm , retracing steps and moving fluid towards pathways spending extra time in any areas of fibrosis then retracing all steps  and ending with LN's Therapist performed left breast MLD with emphasis on medial breast Fibrosis Short neck, 5 diaphragmatic breaths,  Lt left inguinal nodes and establishment of  Lt axillo-inguinal pathway, then L breast moving fluid towards pathway spending extra time in any areas of fibrosis then retracing all steps and ending with LN's.    08/24/2023 Balls rolls forward x 10, abd x 5 Wall push up x 10 Significant medial breast fibrosis with enlarged pores. New foam pad in TG soft made for pt. Checked pts sleeve and glove ; Custom Sleeve is really short at wrist and not high enough in the back. Glove fits well. Pt will speak with [facility] and see if it can be remade In supine: Short neck, 5 diaphragmatic breaths,  Lt left inguinal nodes and establishment of  Lt axillo-inguinal pathway, then L breast moving fluid towards pathway spending extra time in any areas of fibrosis then retracing all steps and ending with LN's. Discussed that pump [employer] has may not be Flexi touch; She will double check again with [employer] to be sure  08/16/23: Manual Therapy Pt brought her new compression sleeves and gloves. Her sleeve is slightly short but should be okay as it is near the axilla and doesn't cut into her upper arm.  In supine: Short neck, 5 diaphragmatic breaths,  Lt left inguinal  nodes and establishment of  Lt axillo-inguinal pathway, then L breast moving fluid towards pathway spending extra time in any areas of fibrosis then retracing all steps and ending with LN's.  P/ROM to bil shoulders into flex, abd and D2 with bil scapular depression throughout. Therapeutic Exercises Pulleys into flex and abd x 2 mins each returning therapist demo and VC's during to decrease scapular compensation Roll yellow ball up wall into flexion x 10 and abd x 5 each side Forearms on wall with yellow theraband for following: Wall walking x 5 each, scap retraction x 5 each   08/12/2023  Measured AROM Standing scapular retraction, shoulder extension and bilateral ER  red x 10, standing horizontal abd red x 10 In supine: Short neck, 5 diaphragmatic breaths,  Left  axillary and left inguinal nodes and establishment of axilloinguinal pathway, then L breast moving fluid towards pathway spending extra time in any areas of fibrosis then retracing all steps and ending with LN's. Pt practiced all steps and required only occasional VC's and TC's  07/22/2023  Standing postural exs; Bilateral Scapular retraction, shoulder extension, bilateral ER x 10 Standing jobes flexion, scaption, abduction x 10 In supine: Short neck, 5 diaphragmatic breaths,  Left  axillary and left inguinal nodes and establishment of axilloinguinal pathway, then L breast moving fluid towards pathway spending extra time in any areas of fibrosis then retracing all steps and ending with LN's.  Updated HEP 07/15/2023  Reviewed Supine scapular series yellow x 5 flexion and scaption, x 10 horizontal abd, bilateral ER x 10 and sword x 5 ea In supine: Short neck, 5 diaphragmatic breaths,  Left  axillary and left inguinal nodes and establishment of axilloinguinal pathway, then L breast moving fluid towards pathway spending extra time in any areas of fibrosis then retracing all steps and ending with LN's. Educated pt while performing and pt  performed all steps with occasional VC's and TC's  initially, and then doing very well Chip pack placed in medial bra by pt.  07/13/2023 Discussed flexitouch and gave pt information so she can call insurance Company. Supine scapular series x 10 flexion, ER, horizontal abd and sword Bil In supine: Short neck, 5 diaphragmatic breaths,  Left  axillary and left inguinal nodes and establishment of axilloinguinal pathway, then L breast moving fluid towards pathway spending extra time in any areas of fibrosis then retracing all steps and ending with LN's. Educated pt while performing and pt performed all steps with multiple VC's and TC's required initially, and then doing very well Made small chip pack to place in bra for medial fibrosis  07/08/2023  Standing wall arc x 4, left SB stretch Supine scapular series yellow x 5 flexion and scaption, x 10 horizontal abd, bilateral ER x 10 and sword x 5 ea In supine: Short neck, 5 diaphragmatic breaths,  L inguinal nodes and establishment of axilloinguinal pathway, then L breast moving fluid towards pathwaysspending extra time in any areas of fibrosis then retracing all steps and ending with LN's. Educated pt while performing Photo taken of left nipple and placed in chart; skin crack in middle with area of white inside. May be lotion but sent to [facility] NP Updated HEP with yellow band and pictures of all 07/06/2023 Examined bilateral breast; increased edema with enlarged pores left breast with medial fibrosis and peau d'orange and bra imprint on left, mild medial breast swelling noted right breast, still pending fills. Pt has bilateral UE grafts that were never used for dialysis Pulleys x 2 min flexion and abd Supine AROM bilater flexion, scaption , horizontal abd x 5-6 reps ea STM Right pectorals, UT, lateral trunk with cocoa butter PROM Right shoulder flexion, scaption, abd with scapular depression prn In supine: Short neck, 5 diaphragmatic breaths,  L  inguinal nodes and establishment of axilloinguinal pathway, then L breast moving fluid towards pathwaysspending extra time in any areas of fibrosis then retracing all steps. Pt awaiting custom sleeve and glove from [facility] in [place] Pt gave permission to send demographics to [employer]; will make aware she has bilateral grafts in UE never used for dialysis  06/23/2023  Therapeutic Exercises Pulleys into flex and abd  x 2 mins each Roll yellow ball up wall :Flex and Rt abd x 10  Supine AROM flexion, scaption, horizontal abduction x5 Manual Therapy P/ROM to Rt shoulders into flex, abd and D2 to pts tolerance and with scapular depression throughout STM to Rt pect tendon , Right UT and Rt lateral trunk along areas of tightness, started in supine then with pt in Lt S/L with cocoa butter, also to medial scapular border Measured Right shoulder AROM  06/21/23: Therapeutic Exercises Pulleys into flex and abd x 2 mins each Roll yellow ball up wall :Flex and Rt abd x 10 Manual Therapy P/ROM to Rt shoulders into flex, abd and D2 to pts tolerance and with scapular depression throughout STM to Rt pect tendon and Rt lateral trunk along areas of tightness, started in supine then with pt in Lt S/L with cocoa butter, also to medial scapular border Scap Mobs to Rt scapula into protraction and retraction  06/16/23: PROM to R shoulder in to flexion and abduction with v/c to relax to allow PROM, pt reports increased comfort afterwards, Issued info to pt to follow up on compression sleeve with [facility] for LUE that she was measured for earlier this year.     PATIENT EDUCATION:  Education details: need for post op PT for improving ROM Person educated: Patient Education method: Explanation Education comprehension: verbalized understanding  HOME EXERCISE PROGRAM: Post op breast exercises  ASSESSMENT:  CLINICAL IMPRESSION: Pt is due to have her expander exchange surgery on Sep 15, 2023.  She will be discharged from therapy at this time, but may choose to return prn after her next surgery. She has met, or partially met all goals established. She was fit for a custom compression sleeve and glove however, the sleeve did not fit and needs to be remade. The new measurements have been sent to [facility]. She has achieved her ROM and pain goals. She is also independent in her left breast MLD. A chip foam pad was made for her bra today, and the area of fibrosis does soften with MLD. She will be getting an [employer] pump but is not to use it until approved by [employer]. Pt is discharged from formal therapy at this time but will contact us with questions or concerns.  OBJECTIVE IMPAIRMENTS: decreased ROM, decreased strength, increased edema, increased fascial restrictions, impaired UE functional use, postural dysfunction, and pain.   ACTIVITY LIMITATIONS: carrying, lifting, and reach over head  PARTICIPATION LIMITATIONS: meal prep, cleaning, laundry, community activity, occupation, and yard work  PERSONAL FACTORS: 1 comorbidity: hx of lumpectomy and radiation  are also affecting patient's functional outcome.   REHAB POTENTIAL: Good  CLINICAL DECISION MAKING: Stable/uncomplicated  EVALUATION COMPLEXITY: Low  GOALS: Goals reviewed with patient? Yes  SHORT TERM GOALS=LONG TERM GOALS Target date: 07/14/23  Pt will obtain appropriate compression garments for long term management of LUE lymphedema. Baseline: Goal status: Partially MET, Received but poor fit. Asked to have them remade by [facility] 2.  Pt will be independent with self MLD and compression bandaging for LUE. Baseline:  Goal status: Partially MET 08/31/2023 UE MLD instructed today  3.  Pt will demonstrate 165 degrees of R shoulder flexion to allow her to reach overhead. Baseline:  Goal status: MET 06/23/2023 4.  Pt will demonstrate 165 degrees of R shoulder abduction to allow her to reach out to the side. Baseline:  Goal  status:MET, 06/23/2023  5.  Pt will be independent in a home exercise program for continued strengthening and stretching.  Baseline:  Goal status: MET 08/12/2023 6.  Pt will report a 50% improvement in pain and discomfort in R upper quadrant to allow improved comfort.  Baseline:  Goal status:MET 08/31/2023 7.  7. Pt will be independent in self MLD to left breast to reduce swelling  Goal status;MET 08/31/2023  PLAN:  PT FREQUENCY: 1x/week  PT DURATION: 4 weeks  PLANNED INTERVENTIONS: Therapeutic exercises, Therapeutic activity, Patient/Family education, Self Care, Joint mobilization, Orthotic/Fit training, Manual lymph drainage, Compression bandaging, scar mobilization, Taping, Vasopneumatic device, Manual therapy, and Re-evaluation  PLAN FOR NEXT SESSION: SOZO after expander is out,, Pt is due to have expander exchange on Dec. 11. May require therapy after that, Review MLD to left UE prn Pt. Is discharged but will contact us with questions or concerns. PHYSICAL THERAPY DISCHARGE SUMMARY  Visits from Start of Care: 12  Current functional level related to goals / functional outcomes: Achieved or partially achieved all goals   Remaining deficits: Left breast with medial fibrosis, needs review of left UE MLD, awaiting new custom compression sleeve from [facility] as the one received was too short.   Education / Equipment: HEP for ROM, Breast and Left UE MLD   Patient agrees to discharge. Patient goals were partially met. Patient is being discharged due to  being pleased with her current level and pending Surgery on Dec 11,2024.  Waynette Buttery, PT 08/31/2023, 3:58 PM

## 2023-09-13 ENCOUNTER — Ambulatory Visit: Payer: 59 | Admitting: Hematology and Oncology

## 2023-09-15 ENCOUNTER — Other Ambulatory Visit: Payer: Self-pay | Admitting: Plastic Surgery

## 2023-09-20 LAB — SURGICAL PATHOLOGY

## 2023-11-25 ENCOUNTER — Telehealth: Payer: Self-pay | Admitting: Hematology and Oncology

## 2023-11-25 NOTE — Telephone Encounter (Signed)
 Rescheduled appointment per patients request via incoming call. Patient is aware of the changes made to her upcoming appointment.

## 2023-12-02 ENCOUNTER — Ambulatory Visit: Payer: 59 | Admitting: Hematology and Oncology

## 2023-12-11 ENCOUNTER — Telehealth: Payer: Self-pay | Admitting: Hematology and Oncology

## 2023-12-11 NOTE — Telephone Encounter (Signed)
 Scheduled appointment per scheduling message. Left VM with appointment details.

## 2023-12-23 ENCOUNTER — Ambulatory Visit: Payer: 59 | Admitting: Hematology and Oncology

## 2023-12-28 ENCOUNTER — Inpatient Hospital Stay: Payer: 59 | Attending: Hematology and Oncology | Admitting: Hematology and Oncology

## 2023-12-28 ENCOUNTER — Telehealth: Payer: Self-pay | Admitting: Hematology and Oncology

## 2023-12-28 VITALS — BP 147/86 | HR 65 | Temp 98.7°F | Resp 18 | Ht 69.0 in | Wt 230.9 lb

## 2023-12-28 DIAGNOSIS — Z79899 Other long term (current) drug therapy: Secondary | ICD-10-CM | POA: Insufficient documentation

## 2023-12-28 DIAGNOSIS — R635 Abnormal weight gain: Secondary | ICD-10-CM | POA: Insufficient documentation

## 2023-12-28 DIAGNOSIS — Z9011 Acquired absence of right breast and nipple: Secondary | ICD-10-CM | POA: Diagnosis not present

## 2023-12-28 DIAGNOSIS — R252 Cramp and spasm: Secondary | ICD-10-CM | POA: Insufficient documentation

## 2023-12-28 DIAGNOSIS — C50412 Malignant neoplasm of upper-outer quadrant of left female breast: Secondary | ICD-10-CM | POA: Insufficient documentation

## 2023-12-28 DIAGNOSIS — Z79621 Long term (current) use of calcineurin inhibitor: Secondary | ICD-10-CM | POA: Insufficient documentation

## 2023-12-28 DIAGNOSIS — Z1721 Progesterone receptor positive status: Secondary | ICD-10-CM | POA: Insufficient documentation

## 2023-12-28 DIAGNOSIS — Z1732 Human epidermal growth factor receptor 2 negative status: Secondary | ICD-10-CM | POA: Diagnosis not present

## 2023-12-28 DIAGNOSIS — Z79810 Long term (current) use of selective estrogen receptor modulators (SERMs): Secondary | ICD-10-CM | POA: Insufficient documentation

## 2023-12-28 DIAGNOSIS — Z170 Estrogen receptor positive status [ER+]: Secondary | ICD-10-CM | POA: Insufficient documentation

## 2023-12-28 MED ORDER — TAMOXIFEN CITRATE 10 MG PO TABS: 10.0000 mg | ORAL_TABLET | Freq: Every day | ORAL | 3 refills | Status: AC

## 2023-12-28 NOTE — Telephone Encounter (Signed)
 Scheduled appointment per 3/25 los. Talked with the patient and she is aware of the made appointment.

## 2023-12-28 NOTE — Assessment & Plan Note (Signed)
 Bilateral lumpectomies 03/04/2022 Right lumpectomy: Intermediate grade to high-grade DCIS with necrosis and calcifications, right medial margin: Intermediate grade DCIS, right superior margin: Intermediate grade DCIS, final margins negative, right additional inferior margin: DCIS. Left lumpectomy: Grade 1 IDC 1.6 cm with DCIS margins negative; Left second lumpectomy: Grade 2 IDC 1 cm, DCIS, DCIS focally involves lateral margin, 0/3 lymph nodes negative Right mastectomy 05/19/2023: No residual DCIS   Treatment plan: 1. Oncotype DX score: 5 (ROR 3%) 2. Bilateral radiation therapies: 05/27/2022-07/13/2022 3.  Antiestrogen therapy started 07/27/2022    Tamoxifen toxicities: Cycles returned Hair thinning Slight mental clouding We reduced the dosage of tamoxifen to 10 mg a day   Breast cancer surveillance: Breast exam 12/28/2023: Benign Mammogram 01/21/2023: Indeterminate diffuse pleomorphic and coarse heterogeneous calcifications right breast spanning 9.6 cm Right breast biopsy 02/12/2023: Posterior: High-grade DCIS focal microinvasion cannot be excluded, right breast biopsy anterior: High-grade DCIS ER 100%, PR 80% Right mastectomy 05/19/2023: No residual DCIS   Return to clinic in 1 year for follow-up

## 2023-12-28 NOTE — Progress Notes (Signed)
 Patient Care Team: Benita Stabile, MD as PCP - General (Internal Medicine) Deterding, Fayrene Fearing, MD as Consulting Physician (Nephrology) Serena Croissant, MD as Consulting Physician (Hematology and Oncology) Antony Blackbird, MD as Consulting Physician (Radiation Oncology) Emelia Loron, MD as Consulting Physician (General Surgery)  DIAGNOSIS:  Encounter Diagnosis  Name Primary?   Malignant neoplasm of upper-outer quadrant of left breast in female, estrogen receptor positive (HCC) Yes    SUMMARY OF ONCOLOGIC HISTORY: Oncology History  Malignant neoplasm of upper-outer quadrant of left breast in female, estrogen receptor positive (HCC)  01/22/2022 Initial Diagnosis   Work-up performed for intermittent bloody left nipple discharge.  Mammogram and ultrasound revealed bilateral breast masses  Right breast 2 hypoechoic masses 1.6 cm and 0.6 cm.  No axillary lymph nodes.  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 Left lumpectomy: Grade 1 IDC 1.6 cm with DCIS margins negative; Left second lumpectomy: Grade 2 IDC 1 cm, DCIS, DCIS focally involves lateral margin, 0/3 lymph nodes negative   03/04/2022 Cancer Staging   Staging form: Breast, AJCC 8th Edition - Pathologic stage from 03/04/2022: Stage IA (pT1c, pN0, cM0, G2, ER+, PR+, HER2-) - Signed by Loa Socks, NP on 11/10/2022 Histologic grading system: 3 grade system   05/26/2022 - 07/13/2022 Radiation Therapy   Site Technique Total Dose (Gy) Dose per Fx (Gy) Completed Fx Beam Energies  Breast, Right: Breast_R 3D 50.4/50.4 1.8 28/28  10X  Breast, Right: Breast_R_Bst specialPort 12/12 2 6/6 12E, 15E  Breast, Left: Breast_L 3D 50.4/50.4 1.8 28/28 10X  Breast, Left: Breast_L_Bst 3D 12/12 2 6/6 6X, 10X     07/2022 -  Anti-estrogen oral therapy   Tamoxifen   Ductal carcinoma in situ (DCIS) of right breast  02/09/2022 Initial Diagnosis   Ductal carcinoma in situ (DCIS) of right breast   03/04/2022 Cancer Staging   Staging form: Breast, AJCC 8th Edition - Pathologic stage from 03/04/2022: Stage 0 (pTis (DCIS), pN0, cM0, ER+, PR+) - Signed by Loa Socks, NP on 11/10/2022 Nuclear grade: G3   05/19/2023 Surgery   Right mastectomy: Negative for residual DCIS, focal ADH     CHIEF COMPLIANT: Surveillance of breast cancer on tamoxifen therapy  HISTORY OF PRESENT ILLNESS:   History of Present Illness The patient, with a history of kidney disease and breast cancer, presents for a routine follow-up. She reports a significant weight gain during a period of unemployment, which she attributes to decreased physical activity and dietary changes. She has recently returned to work, which involves a significant amount of walking, and she hopes this will help her lose weight. She also expresses concerns about starting Ozempic, a medication recommended by her nephrologist, due to the number of medications she is already taking for her various conditions.  The patient's breast cancer is being managed with tamoxifen, which was recently reduced from 20mg  to 10mg  due to side effects. She reports that the lower dose is well-tolerated, with occasional muscle cramps but no mood swings or hot flashes. She also mentions a discoloration on her breast that has not resolved, and she is considering a surgical lift to address asymmetry following her cancer  surgery. However, she expresses hesitation about additional surgery due to concerns about lymphedema.  In addition to her physical health, the patient discusses her struggles with sleep, often  requiring Tylenol PM to help her rest. She also mentions a fondness for sweet tea, which she acknowledges may be contributing to her weight gain.     ALLERGIES:  is allergic to nickel.  MEDICATIONS:  Current Outpatient Medications  Medication Sig Dispense Refill   acetaminophen (TYLENOL) 500 MG tablet Take 1,000 mg by mouth every 6 (six) hours as needed for moderate pain.     calcitRIOL (ROCALTROL) 0.25 MCG capsule Take 0.25 mcg by mouth 2 (two) times daily.     calcium carbonate (TUMS EXTRA STRENGTH 750) 750 MG chewable tablet Chew 2 tablets by mouth 3 (three) times daily.     diltiazem (CARDIZEM) 30 MG tablet Take 30 mg by mouth 2 (two) times daily.     diphenhydramine-acetaminophen (TYLENOL PM) 25-500 MG TABS tablet Take 1 tablet by mouth at bedtime as needed (sleep).     furosemide (LASIX) 40 MG tablet Take 40 mg by mouth See admin instructions. Take 40 mg daily, may take a second 40 mg dose as needed for swelling     Iron-FA-B Cmp-C-Biot-Probiotic (FUSION PLUS) CAPS Take 1 capsule by mouth daily.     magnesium oxide (MAG-OX) 400 (240 Mg) MG tablet Take 1 tablet by mouth 2 (two) times daily.     metoprolol tartrate (LOPRESSOR) 50 MG tablet Take 50 mg by mouth 2 (two) times daily.     Multiple Vitamins-Minerals (HAIR/SKIN/NAILS) TABS Take 2 tablets by mouth daily.     mycophenolate (CELLCEPT) 250 MG capsule Take 750 mg by mouth 2 (two) times daily.      pantoprazole (PROTONIX) 40 MG tablet Take 40 mg by mouth daily as needed (acid reflux).     predniSONE (DELTASONE) 5 MG tablet Take 5 mg by mouth daily.     tacrolimus (PROGRAF) 1 MG capsule Take 3 capsules (3 mg total) by mouth 2 (two) times daily.     tamoxifen (NOLVADEX) 10 MG tablet Take 1 tablet (10 mg total) by mouth daily. 90 tablet 3   No current facility-administered medications for this visit.    PHYSICAL EXAMINATION: ECOG PERFORMANCE STATUS: 1 - Symptomatic but completely ambulatory  Vitals:   12/28/23 1118 12/28/23 1141   BP: (!) 144/90 (!) 147/86  Pulse: 65   Resp: 18   Temp: 98.7 F (37.1 C)   SpO2: 100%    Filed Weights   12/28/23 1118  Weight: 230 lb 14.4 oz (104.7 kg)    Physical Exam No palpable lumps or nodules in the reconstructed breast as well as left breast or axilla  (exam performed in the presence of a chaperone)  LABORATORY DATA:  I have reviewed the data as listed    Latest Ref Rng & Units 05/20/2023    8:52 AM 05/10/2023    3:50 PM 04/09/2022    3:23 PM  CMP  Glucose 70 - 99 mg/dL 147  829  99   BUN 6 - 20 mg/dL 20  15  17    Creatinine 0.44 - 1.00 mg/dL 5.62  1.30  8.65   Sodium 135 - 145 mmol/L 140  142  142   Potassium 3.5 - 5.1 mmol/L 4.3  3.0  3.9   Chloride 98 - 111 mmol/L 103  108  101   CO2 22 - 32 mmol/L 22  21  27    Calcium  8.9 - 10.3 mg/dL 7.2  7.2  9.5     Lab Results  Component Value Date   WBC 3.7 (L) 05/10/2023   HGB 10.5 (L) 05/10/2023   HCT 32.7 (L) 05/10/2023   MCV 81.5 05/10/2023   PLT 237 05/10/2023   NEUTROABS 9.2 (H) 08/30/2013    ASSESSMENT & PLAN:  Malignant neoplasm of upper-outer quadrant of left breast in female, estrogen receptor positive (HCC) Bilateral lumpectomies 03/04/2022 Right lumpectomy: Intermediate grade to high-grade DCIS with necrosis and calcifications, right medial margin: Intermediate grade DCIS, right superior margin: Intermediate grade DCIS, final margins negative, right additional inferior margin: DCIS. Left lumpectomy: Grade 1 IDC 1.6 cm with DCIS margins negative; Left second lumpectomy: Grade 2 IDC 1 cm, DCIS, DCIS focally involves lateral margin, 0/3 lymph nodes negative Right mastectomy 05/19/2023: No residual DCIS   Treatment plan: 1. Oncotype DX score: 5 (ROR 3%) 2. Bilateral radiation therapies: 05/27/2022-07/13/2022 3.  Antiestrogen therapy started 07/27/2022    Tamoxifen toxicities: Doing significantly better on 10 mg of tamoxifen.   Breast cancer surveillance: Breast exam 12/28/2023: Benign Mammogram  01/21/2023: Indeterminate diffuse pleomorphic and coarse heterogeneous calcifications right breast spanning 9.6 cm Right breast biopsy 02/12/2023: Posterior: High-grade DCIS focal microinvasion cannot be excluded, right breast biopsy anterior: High-grade DCIS ER 100%, PR 80% Right mastectomy 05/19/2023: No residual DCIS   Weight concerns: We discussed with her about value of diet, exercise, proper nutrition, sleep, stress management and to make oneself accountable to enable her to lose weight.  She was offered Ozempic but she does not want to do that.  Return to clinic in 1 year for follow-up ------------------------------------- Assessment and Plan Assessment & Plan Malignant neoplasm of upper-outer quadrant of left breast, estrogen receptor positive On tamoxifen 10 mg daily, well-tolerated with occasional muscle cramps. Hesitant about further surgery due to lymphedema risk. - Continue tamoxifen 10 mg oral daily. - Monitor for lymphedema, continue compression garments and massage.  Medication management Mycophenolate dosage reduced. Concerns about medication load and kidney condition. - Continue mycophenolate 750 mg oral bid as adjusted. - Refill tamoxifen prescription. - Review and manage other medications as needed. - Discuss Ozempic concerns with kidney specialist.      No orders of the defined types were placed in this encounter.  The patient has a good understanding of the overall plan. she agrees with it. she will call with any problems that may develop before the next visit here. Total time spent: 30 mins including face to face time and time spent for planning, charting and co-ordination of care   Tamsen Meek, MD 12/28/23

## 2024-03-22 ENCOUNTER — Telehealth: Payer: Self-pay | Admitting: Adult Health

## 2024-03-22 NOTE — Telephone Encounter (Signed)
 Left vm for pt to call back to reschedule appt

## 2024-03-23 ENCOUNTER — Telehealth: Payer: Self-pay | Admitting: Adult Health

## 2024-03-23 NOTE — Telephone Encounter (Signed)
 left vm for pt about rescheduled appt date and time

## 2024-03-28 ENCOUNTER — Encounter: Admitting: Adult Health

## 2024-03-31 ENCOUNTER — Encounter: Admitting: Adult Health

## 2024-04-11 ENCOUNTER — Encounter: Admitting: Adult Health

## 2024-05-05 ENCOUNTER — Inpatient Hospital Stay: Admitting: Adult Health

## 2024-06-08 ENCOUNTER — Other Ambulatory Visit (HOSPITAL_COMMUNITY): Payer: Self-pay

## 2024-06-08 DIAGNOSIS — Z9889 Other specified postprocedural states: Secondary | ICD-10-CM

## 2024-06-29 ENCOUNTER — Other Ambulatory Visit (HOSPITAL_COMMUNITY): Payer: Self-pay

## 2024-06-29 ENCOUNTER — Ambulatory Visit (HOSPITAL_COMMUNITY)
Admission: RE | Admit: 2024-06-29 | Discharge: 2024-06-29 | Disposition: A | Discharge status: Home / Self Care | Source: Ambulatory Visit

## 2024-06-29 DIAGNOSIS — R928 Other abnormal and inconclusive findings on diagnostic imaging of breast: Secondary | ICD-10-CM

## 2024-06-29 DIAGNOSIS — Z9889 Other specified postprocedural states: Secondary | ICD-10-CM | POA: Diagnosis present

## 2024-07-04 ENCOUNTER — Other Ambulatory Visit (HOSPITAL_COMMUNITY)

## 2024-07-20 ENCOUNTER — Ambulatory Visit (HOSPITAL_COMMUNITY): Admission: RE | Admit: 2024-07-20 | Source: Ambulatory Visit

## 2024-07-31 ENCOUNTER — Other Ambulatory Visit (HOSPITAL_COMMUNITY): Payer: Self-pay

## 2024-07-31 DIAGNOSIS — R928 Other abnormal and inconclusive findings on diagnostic imaging of breast: Secondary | ICD-10-CM

## 2024-08-01 ENCOUNTER — Ambulatory Visit (HOSPITAL_COMMUNITY)
Admission: RE | Admit: 2024-08-01 | Discharge: 2024-08-01 | Disposition: A | Discharge status: Home / Self Care | Source: Ambulatory Visit

## 2024-08-01 ENCOUNTER — Encounter (HOSPITAL_COMMUNITY): Payer: Self-pay

## 2024-08-01 ENCOUNTER — Other Ambulatory Visit (HOSPITAL_COMMUNITY): Payer: Self-pay

## 2024-08-01 DIAGNOSIS — R928 Other abnormal and inconclusive findings on diagnostic imaging of breast: Secondary | ICD-10-CM

## 2024-08-01 MED ORDER — LIDOCAINE HCL (PF) 2 % IJ SOLN
INTRAMUSCULAR | Status: AC
  Filled 2024-08-01: qty 10

## 2024-08-01 MED ORDER — LIDOCAINE HCL (PF) 2 % IJ SOLN: 10.0000 mL | Freq: Once | INTRAMUSCULAR | Status: DC

## 2024-08-01 MED ORDER — LIDOCAINE-EPINEPHRINE (PF) 1 %-1:200000 IJ SOLN: 10.0000 mL | Freq: Once | INTRAMUSCULAR | Status: DC

## 2024-08-03 ENCOUNTER — Other Ambulatory Visit (HOSPITAL_COMMUNITY): Payer: Self-pay

## 2024-08-03 DIAGNOSIS — N6320 Unspecified lump in the left breast, unspecified quadrant: Secondary | ICD-10-CM

## 2024-08-08 ENCOUNTER — Other Ambulatory Visit: Payer: Self-pay

## 2024-08-08 DIAGNOSIS — N6320 Unspecified lump in the left breast, unspecified quadrant: Secondary | ICD-10-CM

## 2024-08-30 ENCOUNTER — Encounter (HOSPITAL_COMMUNITY): Payer: Self-pay

## 2024-09-08 ENCOUNTER — Encounter (HOSPITAL_COMMUNITY): Payer: Self-pay

## 2024-09-11 ENCOUNTER — Encounter (HOSPITAL_COMMUNITY): Payer: Self-pay

## 2024-09-12 ENCOUNTER — Encounter (HOSPITAL_COMMUNITY): Payer: Self-pay

## 2024-09-13 ENCOUNTER — Encounter (HOSPITAL_COMMUNITY): Payer: Self-pay

## 2024-09-14 ENCOUNTER — Encounter (HOSPITAL_COMMUNITY): Payer: Self-pay

## 2024-09-15 ENCOUNTER — Encounter (HOSPITAL_COMMUNITY): Payer: Self-pay

## 2024-09-15 ENCOUNTER — Other Ambulatory Visit

## 2024-10-11 ENCOUNTER — Encounter (HOSPITAL_COMMUNITY): Payer: Self-pay

## 2024-10-13 ENCOUNTER — Encounter (HOSPITAL_COMMUNITY): Payer: Self-pay

## 2024-11-03 ENCOUNTER — Encounter (HOSPITAL_COMMUNITY): Payer: Self-pay

## 2024-11-17 ENCOUNTER — Other Ambulatory Visit

## 2024-11-24 ENCOUNTER — Other Ambulatory Visit

## 2025-01-01 ENCOUNTER — Inpatient Hospital Stay: Admitting: Hematology and Oncology

## 2025-02-02 ENCOUNTER — Encounter
